# Patient Record
Sex: Male | Born: 1940 | Race: White | Hispanic: No | Marital: Married | Smoking: Never smoker
Health system: Southern US, Community
[De-identification: ages and names within clinical notes are randomized; demographics above are authoritative.]

## PROBLEM LIST (undated history)

## (undated) DIAGNOSIS — F0390 Unspecified dementia without behavioral disturbance: Secondary | ICD-10-CM

## (undated) DIAGNOSIS — I480 Paroxysmal atrial fibrillation: Secondary | ICD-10-CM

## (undated) DIAGNOSIS — G20 Parkinson's disease: Secondary | ICD-10-CM

## (undated) DIAGNOSIS — M1990 Unspecified osteoarthritis, unspecified site: Secondary | ICD-10-CM

## (undated) DIAGNOSIS — Z955 Presence of coronary angioplasty implant and graft: Secondary | ICD-10-CM

## (undated) DIAGNOSIS — I2510 Atherosclerotic heart disease of native coronary artery without angina pectoris: Secondary | ICD-10-CM

## (undated) DIAGNOSIS — E785 Hyperlipidemia, unspecified: Secondary | ICD-10-CM

## (undated) DIAGNOSIS — I5031 Acute diastolic (congestive) heart failure: Secondary | ICD-10-CM

## (undated) DIAGNOSIS — I723 Aneurysm of iliac artery: Secondary | ICD-10-CM

## (undated) DIAGNOSIS — Z951 Presence of aortocoronary bypass graft: Secondary | ICD-10-CM

## (undated) DIAGNOSIS — I10 Essential (primary) hypertension: Secondary | ICD-10-CM

## (undated) HISTORY — PX: TONSILLECTOMY: SUR1361

## (undated) HISTORY — DX: Acute diastolic (congestive) heart failure: I50.31

## (undated) HISTORY — PX: BACK SURGERY: SHX140

## (undated) HISTORY — DX: Paroxysmal atrial fibrillation: I48.0

## (undated) HISTORY — DX: Atherosclerotic heart disease of native coronary artery without angina pectoris: I25.10

## (undated) HISTORY — PX: COLONOSCOPY: SHX174

## (undated) HISTORY — PX: APPENDECTOMY: SHX54

## (undated) HISTORY — DX: Aneurysm of iliac artery: I72.3

## (undated) HISTORY — DX: Unspecified dementia, unspecified severity, without behavioral disturbance, psychotic disturbance, mood disturbance, and anxiety: F03.90

---

## 1999-11-05 ENCOUNTER — Ambulatory Visit (HOSPITAL_COMMUNITY): Admission: RE | Admit: 1999-11-05 | Discharge: 1999-11-05 | Payer: Self-pay | Admitting: Interventional Cardiology

## 1999-11-05 ENCOUNTER — Encounter: Payer: Self-pay | Admitting: Interventional Cardiology

## 2002-07-08 ENCOUNTER — Encounter (INDEPENDENT_AMBULATORY_CARE_PROVIDER_SITE_OTHER): Payer: Self-pay | Admitting: Specialist

## 2002-07-08 ENCOUNTER — Ambulatory Visit (HOSPITAL_COMMUNITY): Admission: RE | Admit: 2002-07-08 | Discharge: 2002-07-08 | Payer: Self-pay | Admitting: Gastroenterology

## 2005-11-19 ENCOUNTER — Encounter (INDEPENDENT_AMBULATORY_CARE_PROVIDER_SITE_OTHER): Payer: Self-pay | Admitting: Specialist

## 2005-11-19 ENCOUNTER — Ambulatory Visit (HOSPITAL_COMMUNITY): Admission: RE | Admit: 2005-11-19 | Discharge: 2005-11-19 | Payer: Self-pay | Admitting: Gastroenterology

## 2006-09-12 ENCOUNTER — Ambulatory Visit: Payer: Self-pay | Admitting: Family Medicine

## 2006-09-23 ENCOUNTER — Ambulatory Visit: Payer: Self-pay | Admitting: Family Medicine

## 2006-10-07 ENCOUNTER — Ambulatory Visit: Payer: Self-pay

## 2006-11-21 ENCOUNTER — Ambulatory Visit: Payer: Self-pay | Admitting: Family Medicine

## 2007-02-03 ENCOUNTER — Ambulatory Visit: Payer: Self-pay | Admitting: Family Medicine

## 2007-05-06 ENCOUNTER — Ambulatory Visit: Payer: Self-pay | Admitting: Family Medicine

## 2007-05-06 DIAGNOSIS — H1030 Unspecified acute conjunctivitis, unspecified eye: Secondary | ICD-10-CM | POA: Insufficient documentation

## 2007-11-24 DIAGNOSIS — J019 Acute sinusitis, unspecified: Secondary | ICD-10-CM | POA: Insufficient documentation

## 2007-12-01 ENCOUNTER — Ambulatory Visit: Payer: Self-pay | Admitting: Internal Medicine

## 2007-12-02 DIAGNOSIS — E785 Hyperlipidemia, unspecified: Secondary | ICD-10-CM | POA: Insufficient documentation

## 2008-02-05 ENCOUNTER — Ambulatory Visit: Payer: Self-pay | Admitting: Internal Medicine

## 2008-06-27 ENCOUNTER — Ambulatory Visit: Payer: Self-pay | Admitting: Family Medicine

## 2008-06-27 DIAGNOSIS — I5033 Acute on chronic diastolic (congestive) heart failure: Secondary | ICD-10-CM | POA: Insufficient documentation

## 2008-06-28 ENCOUNTER — Encounter: Payer: Self-pay | Admitting: Family Medicine

## 2009-04-18 ENCOUNTER — Encounter: Admission: RE | Admit: 2009-04-18 | Discharge: 2009-04-18 | Payer: Self-pay | Admitting: Family Medicine

## 2009-11-25 DIAGNOSIS — Z955 Presence of coronary angioplasty implant and graft: Secondary | ICD-10-CM

## 2009-11-25 HISTORY — PX: CARDIAC CATHETERIZATION: SHX172

## 2009-11-25 HISTORY — DX: Presence of coronary angioplasty implant and graft: Z95.5

## 2010-08-16 ENCOUNTER — Inpatient Hospital Stay (HOSPITAL_BASED_OUTPATIENT_CLINIC_OR_DEPARTMENT_OTHER): Admission: RE | Admit: 2010-08-16 | Discharge: 2010-08-16 | Payer: Self-pay | Admitting: Interventional Cardiology

## 2010-08-21 ENCOUNTER — Inpatient Hospital Stay (HOSPITAL_COMMUNITY): Admission: RE | Admit: 2010-08-21 | Discharge: 2010-08-22 | Payer: Self-pay | Admitting: Interventional Cardiology

## 2010-08-30 ENCOUNTER — Encounter (HOSPITAL_COMMUNITY)
Admission: RE | Admit: 2010-08-30 | Discharge: 2010-11-28 | Payer: Self-pay | Source: Home / Self Care | Attending: Interventional Cardiology | Admitting: Interventional Cardiology

## 2010-11-29 ENCOUNTER — Encounter (HOSPITAL_COMMUNITY)
Admission: RE | Admit: 2010-11-29 | Discharge: 2010-12-25 | Payer: Self-pay | Source: Home / Self Care | Attending: Interventional Cardiology | Admitting: Interventional Cardiology

## 2010-12-28 ENCOUNTER — Other Ambulatory Visit: Payer: Self-pay | Admitting: Dermatology

## 2011-02-07 LAB — POCT I-STAT, CHEM 8
BUN: 26 mg/dL — ABNORMAL HIGH (ref 6–23)
Calcium, Ion: 1.08 mmol/L — ABNORMAL LOW (ref 1.12–1.32)
Chloride: 105 mEq/L (ref 96–112)
Creatinine, Ser: 1.2 mg/dL (ref 0.4–1.5)
Glucose, Bld: 103 mg/dL — ABNORMAL HIGH (ref 70–99)
HCT: 47 % (ref 39.0–52.0)
Hemoglobin: 16 g/dL (ref 13.0–17.0)
Potassium: 4.1 mEq/L (ref 3.5–5.1)
Sodium: 139 mEq/L (ref 135–145)
TCO2: 27 mmol/L (ref 0–100)

## 2011-02-07 LAB — PLATELET INHIBITION P2Y12
P2Y12 % Inhibition: 40 %
Platelet Function  P2Y12: 180 [PRU] — ABNORMAL LOW (ref 194–418)
Platelet Function Baseline: 302 [PRU] (ref 194–418)

## 2011-02-07 LAB — BASIC METABOLIC PANEL
BUN: 17 mg/dL (ref 6–23)
CO2: 27 mEq/L (ref 19–32)
Calcium: 9.1 mg/dL (ref 8.4–10.5)
Chloride: 105 mEq/L (ref 96–112)
Creatinine, Ser: 1.13 mg/dL (ref 0.4–1.5)
GFR calc Af Amer: >60 mL/min (ref >60)
GFR calc non Af Amer: >60 mL/min (ref >60)
Glucose, Bld: 103 mg/dL — ABNORMAL HIGH (ref 70–99)
Potassium: 4.2 mEq/L (ref 3.5–5.1)
Sodium: 139 mEq/L (ref 135–145)

## 2011-02-07 LAB — CBC
HCT: 40.5 % (ref 39.0–52.0)
Hemoglobin: 14 g/dL (ref 13.0–17.0)
MCH: 34.1 pg — ABNORMAL HIGH (ref 26.0–34.0)
MCHC: 34.6 g/dL (ref 30.0–36.0)
MCV: 98.5 fL (ref 78.0–100.0)
Platelets: 210 10*3/uL (ref 150–400)
RBC: 4.11 MIL/uL — ABNORMAL LOW (ref 4.22–5.81)
RDW: 13.8 % (ref 11.5–15.5)
WBC: 7.5 10*3/uL (ref 4.0–10.5)

## 2011-04-12 NOTE — Op Note (Signed)
   NAME:  Calvin Ramirez, Calvin Ramirez NO.:  [id number]   MEDICAL RECORD NO.:  [id number]                   PATIENT TYPE:  AMB   LOCATION:  ENDO                                 FACILITY:  [facility]   PHYSICIAN:  Charna Elizabeth, M.D.                   DATE OF BIRTH:  1941-11-09   DATE OF PROCEDURE:  07/08/2002  DATE OF DISCHARGE:  07/08/2002                                 OPERATIVE REPORT   PROCEDURES:  Colonoscopy with snare polypectomy x2.   ENDOSCOPIST:  Charna Elizabeth, M.D.   INSTRUMENT USED:  Olympus video colonoscope.   INDICATIONS FOR PROCEDURE:  A 70 year old white male with  guaiac-positive  stools, rule out colonic polyps, masses, hemorrhoids, etc.   PREPROCEDURE PREPARATION:  Informed consent was procured from the patient.  The patient was fasted for 8 hr prior to the procedure and prepped with a  bottle of magnesium citrate and a gallon of NuLytely the night prior to the  procedure.   PREPROCEDURE PHYSICAL:  VITAL SIGNS:  Stable.  NECK:  Supple.  CHEST:  Clear to auscultation.  HEART:  S1, S2 normal.  ABDOMEN:  Soft and normal bowel sounds.   DESCRIPTION OF PROCEDURE:  The patient was placed in the left lateral  decubitus position and sedated with 100 mg Demerol and  10 mg Versed  intravenously.  Once the patient was adequately sedated and maintained on  low-flow oxygen, and continuous cardiac monitoring, the Olympus video  colonoscope was advanced into the rectum to the cecum without difficulty.  Two small sessile polyps were snared at 80 cm.  Small internal hemorrhoids  were seen on retroflexion of the rectum.  A few small scattered diverticula  were seen, especially in the right colon.   IMPRESSION:  1. Two small polyps snared at 80 cm.  2. Small internal hemorrhoids.  3. A few scattered diverticula in the right colon.   RECOMMENDATIONS:  1. Await pathology results.  2. Avoid all nonsteroidals, including aspirin for the next two weeks.  3. A  high-fiber diet has been recommended for the patient.  4. Outpatient follow-up in the next two weeks.                                              Charna Elizabeth, M.D.   JM/MEDQ  D:  07/09/2002  T:  07/12/2002  Job:  [id number]

## 2011-04-12 NOTE — Op Note (Signed)
NAME:  Calvin Ramirez, Calvin Ramirez NO.:  [id number]   MEDICAL RECORD NO.:  [id number]          PATIENT TYPE:  AMB   LOCATION:  ENDO                         FACILITY:  [facility]   PHYSICIAN:  Anselmo Rod, M.D.  DATE OF BIRTH:  1941/11/21   DATE OF PROCEDURE:  11/19/2005  DATE OF DISCHARGE:                                 OPERATIVE REPORT   PROCEDURE PERFORMED:  1.  Colonoscopy.  2.  Snare polypectomy times one.  3.  Cold biopsies times six.   ENDOSCOPIST:  Anselmo Rod, M.D.   INSTRUMENT USED:  Olympus colonoscope.   INDICATIONS FOR THE PROCEDURE:  This is a 70 year old white male with a  history of adenomatous polyp removed in the past undergoing a repeat  colonoscopy for surveillance purposes; rule out recurrent polyps.   PREPROCEDURE PREPARATION:  Informed consent was procured from the patient.  The patient fasted four hours prior to the procedure and prepped with Osmo  Prep pills the night prior to the procedure and the morning of the  procedure.  The risks and benefits of the procedure including a 10% missed  rate of cancer in polyp were discussed with the patient as well.   PREPROCEDURE PHYSICAL EXAMINATION:  VITAL SIGNS:  The patient has stable  vital signs.  NECK:  The neck is supple.  LUNGS:  The lungs are clear to auscultation.  HEART:  Normal S1 and S2.  ABDOMEN:  The advanced is soft with normal bowel sounds.   DESCRIPTION OF THE PROCEDURE:  The patient was placed in the left lateral  decubitus position, sedated with 100 mcg of fentanyl and 10 mg of Versed in  slow incremental doses.  Once the patient was adequately sedated and  maintained on low-flow oxygen and continuous cardiac monitoring the Olympus  video colonoscope was advanced from the rectum to the cecum.  There was some  residual stool in the right colon and multiple washes were done.  The  appendiceal orifice and the cecal valve were clearly visualized, and  photographed.  A small sessile  polyp was snared from the rectosigmoid colon  (hot snare times one).  The five small sessile polyps were removed by cold  biopsy (cold biopsy times six) from the cecum and the proximal right colon.  There was no evidence of diverticulosis.  The rest of the exam was  unremarkable.   Retroflexion in the rectum revealed no abnormalities.   The patient tolerated the procedure well without immediate complications.   IMPRESSION:  1.  One small sessile polyp snared from the rectosigmoid colon and five      polyps removed from the cecum and proximal right colon with cold biopsy      times six.   RECOMMENDATIONS:  1.  Await pathology results.  2.  Avoid all nonsteroidals including aspirin for the next two weeks.  3.  Repeat colonoscopy depending on pathology results.  4.  Outpatient follow up as the need arises in the future.      Anselmo Rod, M.D.  Electronically Signed     JNM/MEDQ  D:  11/19/2005  T:  11/20/2005  Job:  [id number]   cc:   Melida Quitter, M.D.  Fax: [phone]

## 2011-04-12 NOTE — Cardiovascular Report (Signed)
[place]  Patient:    Calvin Ramirez                     MRN: [id number] Proc. Date: 11/05/99 Adm. Date:  [id number] Attending:  Lyn Records. Iii CC:         Arvella Merles, M.D., [facility]                        Cardiac Catheterization  CINE NUMBER:  [id number]  INDICATIONS:  The patient is a 70 year old with positive risk factors for coronary disease including dyslipidemia, hypertension, and positive family history, who as experienced exertional chest discomfort of anginal quality for approximately one year.  This study is being done to define coronary anatomy.  PROCEDURES PERFORMED: 1. Left heart catheterization. 2. Selective coronary angiography. 3. Left ventriculography.  DESCRIPTION OF PROCEDURE:  After informed consent, a 6 French sheath was inserted into the right femoral artery using the modified Seldinger technique.  A 6 French A2 multipurpose catheter was used for hemodynamic recordings, left ventriculography, and selective coronary angiography.  The patient tolerated the procedure without complications.  After review of cine digital images in the room, the case was terminated.  RESULTS:  I. HEMODYNAMIC DATA:    a. The aortic pressure was 117/70 mmHg.    b. Left ventricular pressure 121/17 mmHg.  II: LEFT VENTRICULOGRAPHY:  The left ventricle is normal in size and demonstrates normal contractility.  Ejection fraction is 60%.  III: SELECTIVE CORONARY ANGIOGRAPHY:    a. Left main:  Normal.    b. Left anterior descending coronary artery:  Three diagonal branches       arise from a relatively large LAD.  The first diagonal trifurcates.  The       second and third diagonal are small.  Normal luminal irregularity is noted       in the mid LAD.    c. Circumflex artery:  The circumflex artery is a moderate sized vessel that  gives origin to three obtuse marginal branches and is free of any significant     obstruction.    d. Right coronary artery:  The right coronary artery is essentially normal with       minimal luminal irregularities noted after the PDA.  No significant       obstruction is seen.  The large left ventricular branch arises from the       distal right coronary.  CONCLUSIONS: 1. Essentially normal coronary arteries for the patients age. 2. Normal left ventricular function. 3. Chest discomfort of uncertain etiology.  Certainly no significant obstructive    lesions are noted to account for the patients symptomatology.  RECOMMENDATIONS:  Continue risk factor modification.  Consider GI evaluation as an alternative evaluation to explain the patients symptoms. DD:  11/05/99 TD:  11/05/99 Job: [id number]

## 2011-11-27 DIAGNOSIS — S46819A Strain of other muscles, fascia and tendons at shoulder and upper arm level, unspecified arm, initial encounter: Secondary | ICD-10-CM | POA: Diagnosis not present

## 2011-11-27 DIAGNOSIS — M25419 Effusion, unspecified shoulder: Secondary | ICD-10-CM | POA: Diagnosis not present

## 2011-12-02 DIAGNOSIS — M19019 Primary osteoarthritis, unspecified shoulder: Secondary | ICD-10-CM | POA: Diagnosis not present

## 2011-12-02 DIAGNOSIS — M75120 Complete rotator cuff tear or rupture of unspecified shoulder, not specified as traumatic: Secondary | ICD-10-CM | POA: Diagnosis not present

## 2011-12-09 DIAGNOSIS — IMO0001 Reserved for inherently not codable concepts without codable children: Secondary | ICD-10-CM | POA: Diagnosis not present

## 2011-12-09 DIAGNOSIS — E785 Hyperlipidemia, unspecified: Secondary | ICD-10-CM | POA: Diagnosis not present

## 2011-12-19 ENCOUNTER — Other Ambulatory Visit: Payer: Self-pay | Admitting: Orthopedic Surgery

## 2011-12-24 ENCOUNTER — Other Ambulatory Visit: Payer: Self-pay | Admitting: Orthopedic Surgery

## 2011-12-24 ENCOUNTER — Encounter (HOSPITAL_BASED_OUTPATIENT_CLINIC_OR_DEPARTMENT_OTHER): Payer: Self-pay | Admitting: *Deleted

## 2011-12-24 NOTE — Progress Notes (Signed)
Called for notes from dr smith-no ht problems since stents in 2011-has clearence for surg

## 2011-12-31 DIAGNOSIS — I2510 Atherosclerotic heart disease of native coronary artery without angina pectoris: Secondary | ICD-10-CM | POA: Diagnosis not present

## 2011-12-31 DIAGNOSIS — E785 Hyperlipidemia, unspecified: Secondary | ICD-10-CM | POA: Diagnosis not present

## 2011-12-31 DIAGNOSIS — I10 Essential (primary) hypertension: Secondary | ICD-10-CM | POA: Diagnosis not present

## 2012-01-14 NOTE — Progress Notes (Signed)
Surgery was r/s due to death in family-all preop instructions reviewed Bring all meds,overnight bag

## 2012-01-20 NOTE — H&P (Signed)
Bernardino Dowell is an 71 y.o. male.   Chief Complaint: Complaining of chronic and progressive right shoulder pain HPI: Patient is a 71 year old right-hand-dominant male who presented to our office in late December of 2012 for evaluation and treatment of acute onset of right shoulder pain. He states that he was taking out some laundry acute pain and inability to his right arm above his head. He brought this the attention of his primary care physician. He was referred to Dr. Katy Fitch. Yancy Hascall for further evaluation and treatment. Examination in our office revealed signs of the rotator cuff tear. Patient was sent for a MRI this was accomplished it at [facility] and revealed a retracted supraspinatus rotator cuff tear. This was discussed at length with the patient and it was decided that he would need to undergo repair.  Past Medical History  Diagnosis Date  . Coronary artery disease   . Arthritis   . Hyperlipidemia   . Stented coronary artery 2011    x3    Past Surgical History  Procedure Date  . Cardiac catheterization 2011    3 stents  . Tonsillectomy   . Appendectomy   . Colonoscopy     History reviewed. No pertinent family history. Social History:  reports that he has never smoked. He does not have any smokeless tobacco history on file. He reports that he drinks alcohol. He reports that he does not use illicit drugs.  Allergies:  Allergies  Allergen Reactions  . Penicillins     REACTION: unknown  . Levaquin (Levofloxacin Hemihydrate) Rash    No current facility-administered medications on file as of .   Medications Prior to Admission  Medication Sig Dispense Refill  . aspirin 325 MG EC tablet Take 325 mg by mouth daily.      Marland Kitchen atorvastatin (LIPITOR) 40 MG tablet Take 40 mg by mouth every evening.      . clopidogrel (PLAVIX) 75 MG tablet Take 75 mg by mouth daily.      . traMADol (ULTRAM) 50 MG tablet Take 50 mg by mouth every 6 (six) hours as needed.        No  results found for this or any previous visit (from the past 48 hour(s)).  No results found.   Pertinent items are noted in HPI.  Height 5\' 10"  (1.778 m), weight 97.07 kg (214 lb).  General appearance: alert Head: Normocephalic, without obvious abnormality Neck: supple, symmetrical, trachea midline Resp: clear to auscultation bilaterally Cardio: regular rate and rhythm, S1, S2 normal, no murmur, click, rub or gallop GI: normal findings: bowel sounds normal Extremities:.  His range of motion reveals combined elevation right shoulder 160, left shoulder 175, he has external rotation right shoulder 80, left shoulder 95, he has internal rotation to T-12 on the right vs. approximately L-2 on the left.  He cannot extend to the interscapular plane on the right without significant pain evidencing some degree of adhesive capsulitis.    On isolated strength testing he has weakness of abduction, scaption and forward flexion. He is tender on palpation on the long head of the biceps and has a positive Yergason maneuver.    Plain films of the shoulder demonstrate unfavorable AC anatomy with hypertrophic osteoarthritis and a significant anterolateral osteophyte.  He has sclerosis of the greater tuberosity.  His glenohumeral joint is well preserved.   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  Plan: Patient taken to the operating room to undergo right shoulder arthroscopy with subacromial decompression, distal clavicle resection and repair of retracted rotator cuff. The procedure risks benefits and postoperative course were discussed with the patient at length and he was in agreement with this plan.  DASNOIT,Dymond Gutt J 01/20/2012, 9:28 PM   H&P documentation: 01/21/2012  -History and Physical Reviewed  -Patient has been re-examined  -No change in the plan of  care  Wyn Forster, MD

## 2012-01-21 ENCOUNTER — Encounter (HOSPITAL_BASED_OUTPATIENT_CLINIC_OR_DEPARTMENT_OTHER): Payer: Self-pay | Admitting: *Deleted

## 2012-01-21 ENCOUNTER — Encounter (HOSPITAL_BASED_OUTPATIENT_CLINIC_OR_DEPARTMENT_OTHER): Payer: Self-pay | Admitting: Anesthesiology

## 2012-01-21 ENCOUNTER — Ambulatory Visit (HOSPITAL_BASED_OUTPATIENT_CLINIC_OR_DEPARTMENT_OTHER)
Admission: RE | Admit: 2012-01-21 | Discharge: 2012-01-22 | Disposition: A | Discharge status: Home / Self Care | Payer: Medicare Other | Source: Ambulatory Visit | Attending: Orthopedic Surgery | Admitting: Orthopedic Surgery

## 2012-01-21 ENCOUNTER — Encounter (HOSPITAL_BASED_OUTPATIENT_CLINIC_OR_DEPARTMENT_OTHER)
Admission: RE | Disposition: A | Discharge status: Home / Self Care | Payer: Self-pay | Source: Ambulatory Visit | Attending: Orthopedic Surgery

## 2012-01-21 ENCOUNTER — Ambulatory Visit (HOSPITAL_BASED_OUTPATIENT_CLINIC_OR_DEPARTMENT_OTHER): Payer: Medicare Other | Admitting: Anesthesiology

## 2012-01-21 DIAGNOSIS — M75120 Complete rotator cuff tear or rupture of unspecified shoulder, not specified as traumatic: Secondary | ICD-10-CM | POA: Diagnosis not present

## 2012-01-21 DIAGNOSIS — M24119 Other articular cartilage disorders, unspecified shoulder: Secondary | ICD-10-CM | POA: Diagnosis not present

## 2012-01-21 DIAGNOSIS — M659 Unspecified synovitis and tenosynovitis, unspecified site: Secondary | ICD-10-CM | POA: Insufficient documentation

## 2012-01-21 DIAGNOSIS — Z5333 Arthroscopic surgical procedure converted to open procedure: Secondary | ICD-10-CM | POA: Insufficient documentation

## 2012-01-21 DIAGNOSIS — E785 Hyperlipidemia, unspecified: Secondary | ICD-10-CM | POA: Insufficient documentation

## 2012-01-21 DIAGNOSIS — S43429A Sprain of unspecified rotator cuff capsule, initial encounter: Secondary | ICD-10-CM | POA: Diagnosis not present

## 2012-01-21 DIAGNOSIS — M25519 Pain in unspecified shoulder: Secondary | ICD-10-CM | POA: Diagnosis not present

## 2012-01-21 DIAGNOSIS — G8918 Other acute postprocedural pain: Secondary | ICD-10-CM | POA: Diagnosis not present

## 2012-01-21 DIAGNOSIS — M719 Bursopathy, unspecified: Secondary | ICD-10-CM | POA: Insufficient documentation

## 2012-01-21 DIAGNOSIS — I2510 Atherosclerotic heart disease of native coronary artery without angina pectoris: Secondary | ICD-10-CM | POA: Insufficient documentation

## 2012-01-21 DIAGNOSIS — M25819 Other specified joint disorders, unspecified shoulder: Secondary | ICD-10-CM | POA: Diagnosis not present

## 2012-01-21 DIAGNOSIS — M19019 Primary osteoarthritis, unspecified shoulder: Secondary | ICD-10-CM | POA: Diagnosis not present

## 2012-01-21 DIAGNOSIS — M67919 Unspecified disorder of synovium and tendon, unspecified shoulder: Secondary | ICD-10-CM | POA: Diagnosis not present

## 2012-01-21 DIAGNOSIS — Z9861 Coronary angioplasty status: Secondary | ICD-10-CM | POA: Diagnosis not present

## 2012-01-21 HISTORY — DX: Hyperlipidemia, unspecified: E78.5

## 2012-01-21 HISTORY — DX: Unspecified osteoarthritis, unspecified site: M19.90

## 2012-01-21 HISTORY — DX: Atherosclerotic heart disease of native coronary artery without angina pectoris: I25.10

## 2012-01-21 HISTORY — DX: Presence of coronary angioplasty implant and graft: Z95.5

## 2012-01-21 LAB — POCT HEMOGLOBIN-HEMACUE: Hemoglobin: 14.8 g/dL (ref 13.0–17.0)

## 2012-01-21 SURGERY — SHOULDER ARTHROSCOPY WITH OPEN ROTATOR CUFF REPAIR AND DISTAL CLAVICLE ACROMINECTOMY
Anesthesia: General | Site: Shoulder | Laterality: Right | Wound class: Clean

## 2012-01-21 MED ORDER — BUPIVACAINE HCL (PF) 0.25 % IJ SOLN
INTRAMUSCULAR | Status: DC | PRN
  Administered 2012-01-21: 15 mL

## 2012-01-21 MED ORDER — HYDROCODONE-ACETAMINOPHEN 5-325 MG PO TABS: 1.0000 | ORAL_TABLET | ORAL | Status: DC | PRN

## 2012-01-21 MED ORDER — SUCCINYLCHOLINE CHLORIDE 20 MG/ML IJ SOLN
INTRAMUSCULAR | Status: DC | PRN
  Administered 2012-01-21: 100 mg via INTRAVENOUS

## 2012-01-21 MED ORDER — ATORVASTATIN CALCIUM 40 MG PO TABS
40.0000 mg | ORAL_TABLET | Freq: Every evening | ORAL | Status: DC
Start: 2012-01-21 — End: 2012-01-22

## 2012-01-21 MED ORDER — VANCOMYCIN HCL IN DEXTROSE 1-5 GM/200ML-% IV SOLN
1000.0000 mg | Freq: Two times a day (BID) | INTRAVENOUS | Status: AC
  Administered 2012-01-22: 1000 mg via INTRAVENOUS

## 2012-01-21 MED ORDER — METOCLOPRAMIDE HCL 5 MG/ML IJ SOLN
INTRAMUSCULAR | Status: DC | PRN
  Administered 2012-01-21: 10 mg via INTRAVENOUS

## 2012-01-21 MED ORDER — LIDOCAINE HCL (CARDIAC) 20 MG/ML IV SOLN
INTRAVENOUS | Status: DC | PRN
  Administered 2012-01-21: 60 mg via INTRAVENOUS

## 2012-01-21 MED ORDER — LABETALOL HCL 5 MG/ML IV SOLN
INTRAVENOUS | Status: DC | PRN
  Administered 2012-01-21: 5 mg via INTRAVENOUS

## 2012-01-21 MED ORDER — CHLORHEXIDINE GLUCONATE 4 % EX LIQD: 60.0000 mL | Freq: Once | CUTANEOUS | Status: DC

## 2012-01-21 MED ORDER — IBUPROFEN 600 MG PO TABS: 600.0000 mg | ORAL_TABLET | Freq: Four times a day (QID) | ORAL | Status: AC | PRN

## 2012-01-21 MED ORDER — MIDAZOLAM HCL 2 MG/2ML IJ SOLN
0.5000 mg | INTRAMUSCULAR | Status: DC | PRN
  Administered 2012-01-21: 2 mg via INTRAVENOUS

## 2012-01-21 MED ORDER — METOCLOPRAMIDE HCL 5 MG/ML IJ SOLN: 5.0000 mg | Freq: Three times a day (TID) | INTRAMUSCULAR | Status: DC | PRN

## 2012-01-21 MED ORDER — OXYCODONE-ACETAMINOPHEN 5-325 MG PO TABS
1.0000 | ORAL_TABLET | ORAL | Status: DC | PRN
  Administered 2012-01-22: 2 via ORAL

## 2012-01-21 MED ORDER — LACTATED RINGERS IV SOLN
INTRAVENOUS | Status: DC
  Administered 2012-01-21 (×2): via INTRAVENOUS

## 2012-01-21 MED ORDER — ONDANSETRON HCL 4 MG PO TABS: 4.0000 mg | ORAL_TABLET | Freq: Four times a day (QID) | ORAL | Status: DC | PRN

## 2012-01-21 MED ORDER — METOCLOPRAMIDE HCL 5 MG/ML IJ SOLN: 10.0000 mg | Freq: Once | INTRAMUSCULAR | Status: AC | PRN

## 2012-01-21 MED ORDER — FENTANYL CITRATE 0.05 MG/ML IJ SOLN
50.0000 ug | INTRAMUSCULAR | Status: DC | PRN
  Administered 2012-01-21: 50 ug via INTRAVENOUS

## 2012-01-21 MED ORDER — HYDROMORPHONE HCL PF 1 MG/ML IJ SOLN: 0.2500 mg | INTRAMUSCULAR | Status: DC | PRN

## 2012-01-21 MED ORDER — DEXAMETHASONE SODIUM PHOSPHATE 4 MG/ML IJ SOLN
INTRAMUSCULAR | Status: DC | PRN
  Administered 2012-01-21: 10 mg via INTRAVENOUS

## 2012-01-21 MED ORDER — METOCLOPRAMIDE HCL 5 MG PO TABS: 5.0000 mg | ORAL_TABLET | Freq: Three times a day (TID) | ORAL | Status: DC | PRN

## 2012-01-21 MED ORDER — HYDROMORPHONE HCL 2 MG PO TABS: ORAL_TABLET | ORAL | Status: AC

## 2012-01-21 MED ORDER — DOXYCYCLINE HYCLATE 100 MG PO TABS: 100.0000 mg | ORAL_TABLET | Freq: Two times a day (BID) | ORAL | Status: AC

## 2012-01-21 MED ORDER — FENTANYL CITRATE 0.05 MG/ML IJ SOLN
INTRAMUSCULAR | Status: DC | PRN
  Administered 2012-01-21 (×2): 50 ug via INTRAVENOUS

## 2012-01-21 MED ORDER — ONDANSETRON HCL 4 MG/2ML IJ SOLN: 4.0000 mg | Freq: Four times a day (QID) | INTRAMUSCULAR | Status: DC | PRN

## 2012-01-21 MED ORDER — VANCOMYCIN HCL 1000 MG IV SOLR
1000.0000 mg | INTRAVENOUS | Status: DC | PRN
  Administered 2012-01-21: 1000 mg via INTRAVENOUS

## 2012-01-21 MED ORDER — GLYCOPYRROLATE 0.2 MG/ML IJ SOLN
INTRAMUSCULAR | Status: DC | PRN
  Administered 2012-01-21: 0.2 mg via INTRAVENOUS

## 2012-01-21 MED ORDER — HYDROMORPHONE HCL PF 1 MG/ML IJ SOLN
0.5000 mg | INTRAMUSCULAR | Status: DC | PRN
  Administered 2012-01-22: 1 mg via INTRAVENOUS

## 2012-01-21 MED ORDER — SODIUM CHLORIDE 0.9 % IR SOLN
Status: DC | PRN
  Administered 2012-01-21: 13500 mL

## 2012-01-21 MED ORDER — SODIUM CHLORIDE 0.9 % IV SOLN
INTRAVENOUS | Status: DC
  Administered 2012-01-21: 19:00:00 via INTRAVENOUS

## 2012-01-21 MED ORDER — PROPOFOL 10 MG/ML IV EMUL
INTRAVENOUS | Status: DC | PRN
  Administered 2012-01-21: 200 mg via INTRAVENOUS
  Administered 2012-01-21 (×3): 50 mg via INTRAVENOUS

## 2012-01-21 MED ORDER — ONDANSETRON HCL 4 MG/2ML IJ SOLN
INTRAMUSCULAR | Status: DC | PRN
  Administered 2012-01-21: 4 mg via INTRAVENOUS

## 2012-01-21 SURGICAL SUPPLY — 85 items
ANCH SUT SWLK 19.1 CLS EYLT TL (Anchor) ×1 IMPLANT
ANCH SUT SWLK 19.1 CLS EYLT VT (Anchor) ×1 IMPLANT
ANCH SUT SWLK 19.1X5.5 CLS EL (Anchor) ×4 IMPLANT
ANCHOR BIO SWLOCK 4.75 W/TIG (Anchor) ×2 IMPLANT
ANCHOR PEEK SWIVEL LOCK 5.5 (Anchor) ×8 IMPLANT
ANCHOR SUT BIO SW 4.75 W/FIB (Anchor) ×2 IMPLANT
BANDAGE ADHESIVE 1X3 (GAUZE/BANDAGES/DRESSINGS) IMPLANT
BLADE AVERAGE 25X9 (BLADE) IMPLANT
BLADE CUTTER MENIS 5.5 (BLADE) ×2 IMPLANT
BLADE SURG 15 STRL LF DISP TIS (BLADE) ×2 IMPLANT
BLADE SURG 15 STRL SS (BLADE) ×2
BUR EGG 3PK/BX (BURR) ×2 IMPLANT
BUR OVAL 6.0 (BURR) ×2 IMPLANT
CANISTER OMNI JUG 16 LITER (MISCELLANEOUS) ×2 IMPLANT
CANISTER SUCTION 2500CC (MISCELLANEOUS) ×2 IMPLANT
CANNULA 5.75X7 CRYSTAL CLEAR (CANNULA) IMPLANT
CANNULA SHOULDER 7CM (CANNULA) IMPLANT
CANNULA TWIST IN 8.25X7CM (CANNULA) IMPLANT
CLEANER CAUTERY TIP 5X5 PAD (MISCELLANEOUS) IMPLANT
CLOTH BEACON ORANGE TIMEOUT ST (SAFETY) ×2 IMPLANT
CUTTER MENISCUS  4.2MM (BLADE) ×1
CUTTER MENISCUS 4.2MM (BLADE) ×1 IMPLANT
DECANTER SPIKE VIAL GLASS SM (MISCELLANEOUS) IMPLANT
DRAPE INCISE IOBAN 66X45 STRL (DRAPES) ×2 IMPLANT
DRAPE STERI 35X30 U-POUCH (DRAPES) ×2 IMPLANT
DRAPE SURG 17X23 STRL (DRAPES) ×2 IMPLANT
DRAPE U-SHAPE 47X51 STRL (DRAPES) ×2 IMPLANT
DRAPE U-SHAPE 76X120 STRL (DRAPES) ×4 IMPLANT
DRSG PAD ABDOMINAL 8X10 ST (GAUZE/BANDAGES/DRESSINGS) ×2 IMPLANT
DURAPREP 26ML APPLICATOR (WOUND CARE) ×2 IMPLANT
ELECT REM PT RETURN 9FT ADLT (ELECTROSURGICAL) ×2
ELECTRODE REM PT RTRN 9FT ADLT (ELECTROSURGICAL) ×1 IMPLANT
GLOVE BIO SURGEON STRL SZ 6.5 (GLOVE) ×2 IMPLANT
GLOVE BIO SURGEON STRL SZ7 (GLOVE) ×2 IMPLANT
GLOVE BIOGEL M STRL SZ7.5 (GLOVE) ×2 IMPLANT
GLOVE BIOGEL PI IND STRL 7.0 (GLOVE) ×1 IMPLANT
GLOVE BIOGEL PI IND STRL 8 (GLOVE) ×2 IMPLANT
GLOVE BIOGEL PI INDICATOR 7.0 (GLOVE) ×1
GLOVE BIOGEL PI INDICATOR 8 (GLOVE) ×2
GLOVE ORTHO TXT STRL SZ7.5 (GLOVE) ×2 IMPLANT
GOWN BRE IMP PREV XXLGXLNG (GOWN DISPOSABLE) ×6 IMPLANT
GOWN PREVENTION PLUS XLARGE (GOWN DISPOSABLE) ×2 IMPLANT
NDL SUT 6 .5 CRC .975X.05 MAYO (NEEDLE) ×1 IMPLANT
NEEDLE MAYO TAPER (NEEDLE) ×2
NEEDLE MINI RC 24MM (NEEDLE) IMPLANT
NEEDLE SCORPION (NEEDLE) ×2 IMPLANT
PACK ARTHROSCOPY DSU (CUSTOM PROCEDURE TRAY) ×2 IMPLANT
PACK BASIN DAY SURGERY FS (CUSTOM PROCEDURE TRAY) ×2 IMPLANT
PAD CLEANER CAUTERY TIP 5X5 (MISCELLANEOUS)
PASSER SUT SWANSON 36MM LOOP (INSTRUMENTS) IMPLANT
PENCIL BUTTON HOLSTER BLD 10FT (ELECTRODE) ×2 IMPLANT
SLEEVE SCD COMPRESS KNEE MED (MISCELLANEOUS) ×2 IMPLANT
SLING ARM FOAM STRAP LRG (SOFTGOODS) ×2 IMPLANT
SLING ARM FOAM STRAP MED (SOFTGOODS) IMPLANT
SPONGE GAUZE 4X4 12PLY (GAUZE/BANDAGES/DRESSINGS) ×2 IMPLANT
SPONGE LAP 4X18 X RAY DECT (DISPOSABLE) IMPLANT
STRIP CLOSURE SKIN 1/2X4 (GAUZE/BANDAGES/DRESSINGS) ×2 IMPLANT
SUCTION FRAZIER TIP 10 FR DISP (SUCTIONS) IMPLANT
SUT ETHIBOND 2 OS 4 DA (SUTURE) IMPLANT
SUT ETHILON 4 0 PS 2 18 (SUTURE) IMPLANT
SUT FIBERWIRE #2 38 T-5 BLUE (SUTURE)
SUT FIBERWIRE 3-0 18 TAPR NDL (SUTURE)
SUT PROLENE 1 CT (SUTURE) IMPLANT
SUT PROLENE 3 0 PS 2 (SUTURE) ×2 IMPLANT
SUT TIGER TAPE 7 IN WHITE (SUTURE) ×2 IMPLANT
SUT VIC AB 0 CT1 27 (SUTURE)
SUT VIC AB 0 CT1 27XBRD ANBCTR (SUTURE) IMPLANT
SUT VIC AB 0 SH 27 (SUTURE) ×2 IMPLANT
SUT VIC AB 2-0 SH 27 (SUTURE) ×2
SUT VIC AB 2-0 SH 27XBRD (SUTURE) ×1 IMPLANT
SUT VIC AB 3-0 SH 27 (SUTURE)
SUT VIC AB 3-0 SH 27X BRD (SUTURE) IMPLANT
SUT VIC AB 3-0 X1 27 (SUTURE) IMPLANT
SUTURE FIBERWR #2 38 T-5 BLUE (SUTURE) IMPLANT
SUTURE FIBERWR 3-0 18 TAPR NDL (SUTURE) IMPLANT
SYR 3ML 23GX1 SAFETY (SYRINGE) IMPLANT
SYR BULB 3OZ (MISCELLANEOUS) IMPLANT
TAPE FIBER 2MM 7IN #2 BLUE (SUTURE) IMPLANT
TAPE PAPER 3X10 WHT MICROPORE (GAUZE/BANDAGES/DRESSINGS) ×2 IMPLANT
TOWEL OR 17X24 6PK STRL BLUE (TOWEL DISPOSABLE) ×2 IMPLANT
TUBE CONNECTING 20X1/4 (TUBING) ×4 IMPLANT
TUBING ARTHROSCOPY IRRIG 16FT (MISCELLANEOUS) ×2 IMPLANT
WAND STAR VAC 90 (SURGICAL WAND) ×2 IMPLANT
WATER STERILE IRR 1000ML POUR (IV SOLUTION) ×2 IMPLANT
YANKAUER SUCT BULB TIP NO VENT (SUCTIONS) IMPLANT

## 2012-01-21 NOTE — Progress Notes (Signed)
Assisted Dr. Frederick with right, ultrasound guided, interscalene  block. Side rails up, monitors on throughout procedure. See vital signs in flow sheet. Tolerated Procedure well. 

## 2012-01-21 NOTE — Transfer of Care (Signed)
Immediate Anesthesia Transfer of Care Note  Patient: Calvin Ramirez  Procedure(s) Performed: Procedure(s) (LRB): SHOULDER ARTHROSCOPY WITH OPEN ROTATOR CUFF REPAIR AND DISTAL CLAVICLE ACROMINECTOMY (Right)  Patient Location: PACU  Anesthesia Type: GA combined with regional for post-op pain  Level of Consciousness: sedated  Airway & Oxygen Therapy: Patient Spontanous Breathing and Patient connected to face mask oxygen  Post-op Assessment: Report given to PACU RN and Post -op Vital signs reviewed and stable  Post vital signs: Reviewed and stable  Complications: No apparent anesthesia complications

## 2012-01-21 NOTE — Brief Op Note (Signed)
01/21/2012  5:45 PM  PATIENT:  Calvin Ramirez  71 y.o. male  PRE-OPERATIVE DIAGNOSIS:  right shoulder impingement, rotator cuff tear, severe ac djd  POST-OPERATIVE DIAGNOSIS:  right shoulder impingement, rotator cuff tear, glenoid and humeral chondromalacia, severe ac djd  PROCEDURE: SHOULDER ARTHROSCOPY WITH OPEN ROTATOR CUFF REPAIR AND DISTAL CLAVICLE EXCISION, ACROMIOPLASTY AND DEBRIDEMENT OF CHONDROMALACIA AND LABRUM RIGHT SHOULDER  SURGEON:  Surgeon(s) and Role:    * Wyn Forster., MD - Primary  PHYSICIAN ASSISTANT:   ASSISTANTS: Mallory Shirk.A-C    ANESTHESIA:   general  EBL:  Total I/O In: 1800 [I.V.:1800] Out: -   BLOOD ADMINISTERED:none  DRAINS: none   LOCAL MEDICATIONS USED:  PLEXUS BLOCK  SPECIMEN:  No Specimen  DISPOSITION OF SPECIMEN:  N/A  COUNTS:  YES  TOURNIQUET:  * No tourniquets in log *  DICTATION: .Other Dictation: Dictation Number [id number]  PLAN OF CARE: Admit for overnight observation  PATIENT DISPOSITION:  PACU - hemodynamically stable.

## 2012-01-21 NOTE — Anesthesia Procedure Notes (Addendum)
Anesthesia Regional Block:  Interscalene brachial plexus block  Pre-Anesthetic Checklist: ,, timeout performed, Correct Patient, Correct Site, Correct Laterality, Correct Procedure, Correct Position, site marked, Risks and benefits discussed,  Surgical consent,  Pre-op evaluation,  At surgeon's request and post-op pain management  Laterality: Right  Prep: chloraprep       Needles:   Needle Type: Other   (Arrow Echogenic)   Needle Length: 9cm  Needle Gauge: 21    Additional Needles:  Procedures: ultrasound guided Interscalene brachial plexus block Narrative:  Start time: 01/21/2012 1:02 PM End time: 01/21/2012 1:08 PM Injection made incrementally with aspirations every 5 mL.  Performed by: Personally  Anesthesiologist: Aldona Lento, MD  Additional Notes: Ultrasound guidance used to: id relevant anatomy, confirm needle position, local anesthetic spread, avoidance of vascular puncture. Picture saved. No complications. Block performed personally by Janetta Hora. Gelene Mink, MD    Interscalene brachial plexus block Procedure Name: Intubation Date/Time: 01/21/2012 2:56 PM Performed by: Signa Kell Pre-anesthesia Checklist: Patient identified, Emergency Drugs available, Suction available and Patient being monitored Patient Re-evaluated:Patient Re-evaluated prior to inductionOxygen Delivery Method: Circle System Utilized Preoxygenation: Pre-oxygenation with 100% oxygen Intubation Type: IV induction Ventilation: Mask ventilation without difficulty Laryngoscope Size: Mac and 3 Grade View: Grade III Tube type: Oral Number of attempts: 1 Airway Equipment and Method: stylet and oral airway Placement Confirmation: positive ETCO2 and breath sounds checked- equal and bilateral Secured at: 22 cm Tube secured with: Tape Dental Injury: Teeth and Oropharynx as per pre-operative assessment

## 2012-01-21 NOTE — Progress Notes (Signed)
Pt very active on admission to PACU , pulled off O2 face mask and trying to get sling off. Two RN's , tech and later wife called to bedside to help hold pt down. Unable to assess  Motor,sensation and pulse due to activity of pt.

## 2012-01-21 NOTE — Op Note (Signed)
OP NOTE DICTATED:  01/21/12 [id number]

## 2012-01-21 NOTE — Anesthesia Preprocedure Evaluation (Signed)
Anesthesia Evaluation  Patient identified by MRN, date of birth, ID band Patient awake    Reviewed: Allergy & Precautions, H&P , NPO status , Patient's Chart, lab work & pertinent test results, reviewed documented beta blocker date and time   Airway Mallampati: II TM Distance: >3 FB Neck ROM: full    Dental   Pulmonary neg pulmonary ROS,          Cardiovascular + CAD and + Cardiac Stents     Neuro/Psych Negative Neurological ROS  Negative Psych ROS   GI/Hepatic negative GI ROS, Neg liver ROS,   Endo/Other  Negative Endocrine ROS  Renal/GU negative Renal ROS  Genitourinary negative   Musculoskeletal   Abdominal   Peds  Hematology negative hematology ROS (+)   Anesthesia Other Findings See surgeon's H&P   Reproductive/Obstetrics negative OB ROS                           Anesthesia Physical Anesthesia Plan  ASA: III  Anesthesia Plan: General   Post-op Pain Management: MAC Combined w/ Regional for Post-op pain   Induction: Intravenous  Airway Management Planned: Oral ETT  Additional Equipment:   Intra-op Plan:   Post-operative Plan: Extubation in OR  Informed Consent: I have reviewed the patients History and Physical, chart, labs and discussed the procedure including the risks, benefits and alternatives for the proposed anesthesia with the patient or authorized representative who has indicated his/her understanding and acceptance.     Plan Discussed with: CRNA and Surgeon  Anesthesia Plan Comments:         Anesthesia Quick Evaluation

## 2012-01-21 NOTE — Anesthesia Postprocedure Evaluation (Signed)
Anesthesia Post Note  Patient: Daxton Nydam  Procedure(s) Performed: Procedure(s) (LRB): SHOULDER ARTHROSCOPY WITH OPEN ROTATOR CUFF REPAIR AND DISTAL CLAVICLE ACROMINECTOMY (Right)  Anesthesia type: general  Patient location: PACU  Post pain: Pain level controlled  Post assessment: Patient's Cardiovascular Status Stable  Last Vitals:  Filed Vitals:   01/21/12 1745  BP: 137/59  Pulse:   Temp:   Resp: 20    Post vital signs: Reviewed and stable  Level of consciousness: sedated  Complications: No apparent anesthesia complications  Had an episode of agitation. Resolved spontaneously. Arta Bruce MD

## 2012-01-22 MED ORDER — ZOLPIDEM TARTRATE 5 MG PO TABS
5.0000 mg | ORAL_TABLET | Freq: Every evening | ORAL | Status: DC | PRN
  Administered 2012-01-21: 5 mg via ORAL

## 2012-01-22 NOTE — Op Note (Signed)
NAME:  Calvin, Ramirez NO.:  MEDICAL RECORD NO.:  [id number]  LOCATION:                                 FACILITY:  PHYSICIAN:  Katy Fitch. Natale Barba, M.D.      DATE OF BIRTH:  DATE OF PROCEDURE:  01/21/2012 DATE OF DISCHARGE:                              OPERATIVE REPORT   PREOPERATIVE DIAGNOSES: 1. 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 2. Significant synovitis. 3. Degenerative labral changes.  POSTOPERATIVE DIAGNOSES:  Confirming retracted chronic supraspinatus infraspinatus rotator cuff tears, synovitis, and labral tear with significant acromioclavicular degenerative arthritis.  OPERATIONS: 1. Diagnostic arthroscopy, right glenohumeral joint. 2. Arthroscopic debridement of labrum and rotator cuff tear, and     limited debridement of glenoid grade 2 chondromalacia. 3. Arthroscopic subacromial decompression with bursectomy,     coracoacromial ligament release and acromioplasty. 4. Arthroscopic distal clavicle resection. 5. Open reconstruction of chronically retracted rotator cuff tear     involving entire supraspinatus rotator interval and infraspinatus.  OPERATING SURGEON:  Katy Fitch. Eddrick Dilone, MD  ASSISTANT:  Marveen Reeks Dasnoit, PA  ANESTHESIA:  General by endotracheal technique supplemented by a right plexus block.  SUPERVISING ANESTHESIOLOGIST:  Janetta Hora. Gelene Mink, M.D.  INDICATIONS:  Calvin Ramirez is a 71 year old gentleman, referred through the courtesy of Dr. Lurena Joiner for evaluation and management of a painful and weak right shoulder.  Clinical examination revealed signs of a significant rotator cuff tear, and plain films documented advanced AC degenerative arthritis.  Mr. Blakeman was sent for an MRI which definitively identified a large retracted rotator cuff tear, unfavorable AC anatomy and signs of synovitis.  We recommended proceeding with diagnostic  arthroscopy and appropriate intervention, anticipating subacromial decompression, distal clavicle resection, repair of the rotator cuff, and labral debridement.  After informed consent, he was brought to the operating room at this time.  Preoperatively, he was interviewed by Dr. Gelene Mink in the holding area.  Dr. Gelene Mink recommended a plexus block.  This was placed without complication with ultrasound control in the holding area, leading to excellent anesthesia of the right forequarter and arm.  DESCRIPTION OF PROCEDURE:  Calvin Ramirez was brought to room 2 of the [facility] and placed in supine position on the operating table.  Following the induction of general endotracheal anesthesia under Dr. Thornton Dales direct supervision, he was carefully positioned in a beach- chair position with aid of a torso and head holder designed for shoulder arthroscopy.  The shoulder was examined under anesthesia.  No sign of adhesive capsulitis noted.  The shoulder was stable in all planes of testing.  The right arm and forequarter prepped with DuraPrep and draped with impervious arthroscopy drapes.  Following routine surgical time-out and confirmation of 1 g of vancomycin being provided as an IV prophylactic antibiotic, Mr. Pavlock's shoulder was instrumented from an anterior approach with a switching stick followed by placement of the arthroscope through a standard posterior viewing portal.  Diagnostic arthroscopy confirmed grade 2 chondromalacia of the glenoid, grade 2 chondromalacia of portions of the humeral head, both of which were documented with digital camera.  The labrum was degenerative.  An anterior  port was created under direct vision and the labrum debrided. The rotator cuff had a very large retracted chronic necrotic tear.  This was documented with a digital camera followed by debridement.  After completion of the intra-articular debridement, the scope was  removed followed by creation of an anterior middle third deltoid splitting incision.  We proceeded to debride the greater tuberosity of remnants of the rotator cuff followed by use of a power burr to lower its profile 3 mm to bleeding cancellous surface.  The rotator cuff was gathered with a reverse mattress suture placed with a Scorpion using a fiber tape followed by placement of 2 medial PEEK corkscrew anchors, followed by medial footprint mattress sutures and a double diamondback reconstruction with the fiber tape used to lateralize the supraspinatus. An anatomic inset of the rotator cuff was created.  Very low profile was achieved.  The suture tails were cut with the arthroscopic suture cutter followed by irrigation of subacromial space and repair of the deltoid split with mattress suture of 0 Vicryl.  The scope was then placed in subacromial space followed by use of the bipolar cautery to remove bursa and release the capsule at [facility] joint.  The medial acromial osteophyte was documented in digital camera and subsequent level to a type 1 morphology.  There was a hook type 3 anterior acromion that was leveled to a type 1 morphology after release of coracoacromial ligament with use of a suction burr to level the acromion.  The distal centimeter of clavicle was removed arthroscopically.  Hemostasis was achieved with bipolar cautery.  Following further bursectomy, hemostasis was achieved followed by withdrawal of the arthroscopic equipment and repair of the portals with 3-0 Prolene.  There were no apparent complications.  This was a large chronic retracted tear that required reconstruction rather than simple repair.  A very satisfactory low-profile repair was achieved.  The wounds were dressed with sterile gauze followed by paper tape.  Ms. Dershem was placed in a sling, awakened from general anesthesia, and transferred to recovery room with stable signs.  He will be discharged in 24  hours with prescriptions for Dilaudid 2 mg 1- 2 tablets p.o. q.4-6 hours p.r.n. pain, 30 tablets without refill, also doxycycline 100 mg p.o. b.i.d. x4 days, and ibuprofen 600 mg 1 p.o. q.6 hours p.r.n. pain.  He will resume his aspirin.  He has been taken off his Plavix by his attending cardiologist, Dr. Verdis Prime.     Katy Fitch Nealy Karapetian, M.D.     RVS/MEDQ  D:  01/21/2012  T:  01/22/2012  Job:  [id number]

## 2012-01-22 NOTE — Discharge Instructions (Signed)
Hand Center Instructions Hand Surgery  Wound Care: Keep your hand elevated above the level of your heart.  Do not allow it to dangle  by your side.  Keep the dressing dry and do not remove it unless your doctor advises you to do so.  He will usually change it at the time of your post-op visit.  Moving your fingers is advised to stimulate circulation but will depend on the site of your surgery.  If you have a splint applied, your doctor will advise you regarding movement.  Activity: Do not drive or operate machinery today.  Rest today and then you may return to your normal activity and work as indicated by your physician.  Diet:  Drink liquids today or eat a light diet.  You may resume a regular diet tomorrow.    General expectations: Pain for two to three days. Fingers may become slightly swollen.  Call your doctor if any of the following occur: Severe pain not relieved by pain medication. Elevated temperature. Dressing soaked with blood. Inability to move fingers. White or bluish color to fingers.  Peripheral Nerve Blocks Your caregiver has placed a nerve block in one of your arms  to reduce pain and discomfort. The block lessens the amount of pain medicine you will need. Your caregiver will inject you in the arm  that was operated on. The injection is usually given away from the surgical site. The injection is a local anesthetic or a combination of local anesthetics. This injection provides numbing pain relief for up to 18 to 24 hours. There are few possible complications from this procedure. However, you should notify your caregiver if you have any problems. Be aware that you may lose feeling at and around the surgical area. If numbness happens, take proper measures to avoid injury.  Do not try to lift items if you have had a nerve block in your arm. Be careful when placing hot or cold items on a numb extremity. SEEK IMMEDIATE MEDICAL CARE IF:  You have redness, swelling, pain, or  discharge at the injection site.   You develop dizziness or lightheadedness.   You have blurred vision.   There is a ringing or buzzing in your ears.   You have a metal taste in your mouth.   You develop numbness or tingling around your mouth.   You develop drowsiness.   You develop confusion.

## 2012-01-24 DIAGNOSIS — M19019 Primary osteoarthritis, unspecified shoulder: Secondary | ICD-10-CM | POA: Diagnosis not present

## 2012-01-24 DIAGNOSIS — M75120 Complete rotator cuff tear or rupture of unspecified shoulder, not specified as traumatic: Secondary | ICD-10-CM | POA: Diagnosis not present

## 2012-02-10 DIAGNOSIS — M24119 Other articular cartilage disorders, unspecified shoulder: Secondary | ICD-10-CM | POA: Diagnosis not present

## 2012-02-18 DIAGNOSIS — M719 Bursopathy, unspecified: Secondary | ICD-10-CM | POA: Diagnosis not present

## 2012-02-18 DIAGNOSIS — M24119 Other articular cartilage disorders, unspecified shoulder: Secondary | ICD-10-CM | POA: Diagnosis not present

## 2012-02-18 DIAGNOSIS — M67919 Unspecified disorder of synovium and tendon, unspecified shoulder: Secondary | ICD-10-CM | POA: Diagnosis not present

## 2012-02-25 DIAGNOSIS — M24119 Other articular cartilage disorders, unspecified shoulder: Secondary | ICD-10-CM | POA: Diagnosis not present

## 2012-02-25 DIAGNOSIS — M719 Bursopathy, unspecified: Secondary | ICD-10-CM | POA: Diagnosis not present

## 2012-02-25 DIAGNOSIS — M67919 Unspecified disorder of synovium and tendon, unspecified shoulder: Secondary | ICD-10-CM | POA: Diagnosis not present

## 2012-03-02 DIAGNOSIS — M67919 Unspecified disorder of synovium and tendon, unspecified shoulder: Secondary | ICD-10-CM | POA: Diagnosis not present

## 2012-03-02 DIAGNOSIS — M24119 Other articular cartilage disorders, unspecified shoulder: Secondary | ICD-10-CM | POA: Diagnosis not present

## 2012-03-02 DIAGNOSIS — M719 Bursopathy, unspecified: Secondary | ICD-10-CM | POA: Diagnosis not present

## 2012-03-10 DIAGNOSIS — M67919 Unspecified disorder of synovium and tendon, unspecified shoulder: Secondary | ICD-10-CM | POA: Diagnosis not present

## 2012-03-10 DIAGNOSIS — M75120 Complete rotator cuff tear or rupture of unspecified shoulder, not specified as traumatic: Secondary | ICD-10-CM | POA: Diagnosis not present

## 2012-03-10 DIAGNOSIS — M24119 Other articular cartilage disorders, unspecified shoulder: Secondary | ICD-10-CM | POA: Diagnosis not present

## 2012-03-18 DIAGNOSIS — M67919 Unspecified disorder of synovium and tendon, unspecified shoulder: Secondary | ICD-10-CM | POA: Diagnosis not present

## 2012-03-18 DIAGNOSIS — M24119 Other articular cartilage disorders, unspecified shoulder: Secondary | ICD-10-CM | POA: Diagnosis not present

## 2012-03-23 DIAGNOSIS — M67919 Unspecified disorder of synovium and tendon, unspecified shoulder: Secondary | ICD-10-CM | POA: Diagnosis not present

## 2012-03-23 DIAGNOSIS — M719 Bursopathy, unspecified: Secondary | ICD-10-CM | POA: Diagnosis not present

## 2012-03-23 DIAGNOSIS — M24119 Other articular cartilage disorders, unspecified shoulder: Secondary | ICD-10-CM | POA: Diagnosis not present

## 2012-03-30 DIAGNOSIS — M67919 Unspecified disorder of synovium and tendon, unspecified shoulder: Secondary | ICD-10-CM | POA: Diagnosis not present

## 2012-03-30 DIAGNOSIS — M719 Bursopathy, unspecified: Secondary | ICD-10-CM | POA: Diagnosis not present

## 2012-03-30 DIAGNOSIS — M24119 Other articular cartilage disorders, unspecified shoulder: Secondary | ICD-10-CM | POA: Diagnosis not present

## 2012-04-06 DIAGNOSIS — M67919 Unspecified disorder of synovium and tendon, unspecified shoulder: Secondary | ICD-10-CM | POA: Diagnosis not present

## 2012-04-06 DIAGNOSIS — M719 Bursopathy, unspecified: Secondary | ICD-10-CM | POA: Diagnosis not present

## 2012-04-06 DIAGNOSIS — M24119 Other articular cartilage disorders, unspecified shoulder: Secondary | ICD-10-CM | POA: Diagnosis not present

## 2012-04-13 DIAGNOSIS — M67919 Unspecified disorder of synovium and tendon, unspecified shoulder: Secondary | ICD-10-CM | POA: Diagnosis not present

## 2012-04-13 DIAGNOSIS — M24119 Other articular cartilage disorders, unspecified shoulder: Secondary | ICD-10-CM | POA: Diagnosis not present

## 2012-04-13 DIAGNOSIS — M719 Bursopathy, unspecified: Secondary | ICD-10-CM | POA: Diagnosis not present

## 2012-05-05 DIAGNOSIS — M24119 Other articular cartilage disorders, unspecified shoulder: Secondary | ICD-10-CM | POA: Diagnosis not present

## 2012-05-05 DIAGNOSIS — M67919 Unspecified disorder of synovium and tendon, unspecified shoulder: Secondary | ICD-10-CM | POA: Diagnosis not present

## 2012-05-11 DIAGNOSIS — M719 Bursopathy, unspecified: Secondary | ICD-10-CM | POA: Diagnosis not present

## 2012-05-11 DIAGNOSIS — M67919 Unspecified disorder of synovium and tendon, unspecified shoulder: Secondary | ICD-10-CM | POA: Diagnosis not present

## 2012-05-11 DIAGNOSIS — M24119 Other articular cartilage disorders, unspecified shoulder: Secondary | ICD-10-CM | POA: Diagnosis not present

## 2012-05-19 DIAGNOSIS — M67919 Unspecified disorder of synovium and tendon, unspecified shoulder: Secondary | ICD-10-CM | POA: Diagnosis not present

## 2012-05-19 DIAGNOSIS — M719 Bursopathy, unspecified: Secondary | ICD-10-CM | POA: Diagnosis not present

## 2012-05-19 DIAGNOSIS — M24119 Other articular cartilage disorders, unspecified shoulder: Secondary | ICD-10-CM | POA: Diagnosis not present

## 2012-06-02 DIAGNOSIS — M24119 Other articular cartilage disorders, unspecified shoulder: Secondary | ICD-10-CM | POA: Diagnosis not present

## 2012-06-02 DIAGNOSIS — M67919 Unspecified disorder of synovium and tendon, unspecified shoulder: Secondary | ICD-10-CM | POA: Diagnosis not present

## 2012-06-02 DIAGNOSIS — M75120 Complete rotator cuff tear or rupture of unspecified shoulder, not specified as traumatic: Secondary | ICD-10-CM | POA: Diagnosis not present

## 2012-06-08 DIAGNOSIS — M67919 Unspecified disorder of synovium and tendon, unspecified shoulder: Secondary | ICD-10-CM | POA: Diagnosis not present

## 2012-06-08 DIAGNOSIS — M24119 Other articular cartilage disorders, unspecified shoulder: Secondary | ICD-10-CM | POA: Diagnosis not present

## 2012-06-08 DIAGNOSIS — M719 Bursopathy, unspecified: Secondary | ICD-10-CM | POA: Diagnosis not present

## 2012-07-08 DIAGNOSIS — H2510 Age-related nuclear cataract, unspecified eye: Secondary | ICD-10-CM | POA: Diagnosis not present

## 2012-10-29 DIAGNOSIS — Z23 Encounter for immunization: Secondary | ICD-10-CM | POA: Diagnosis not present

## 2012-12-09 DIAGNOSIS — E785 Hyperlipidemia, unspecified: Secondary | ICD-10-CM | POA: Diagnosis not present

## 2013-01-05 DIAGNOSIS — M999 Biomechanical lesion, unspecified: Secondary | ICD-10-CM | POA: Diagnosis not present

## 2013-01-05 DIAGNOSIS — M5030 Other cervical disc degeneration, unspecified cervical region: Secondary | ICD-10-CM | POA: Diagnosis not present

## 2013-01-05 DIAGNOSIS — M9981 Other biomechanical lesions of cervical region: Secondary | ICD-10-CM | POA: Diagnosis not present

## 2013-01-05 DIAGNOSIS — IMO0002 Reserved for concepts with insufficient information to code with codable children: Secondary | ICD-10-CM | POA: Diagnosis not present

## 2013-01-06 DIAGNOSIS — M5137 Other intervertebral disc degeneration, lumbosacral region: Secondary | ICD-10-CM | POA: Diagnosis not present

## 2013-01-06 DIAGNOSIS — M999 Biomechanical lesion, unspecified: Secondary | ICD-10-CM | POA: Diagnosis not present

## 2013-01-06 DIAGNOSIS — M5030 Other cervical disc degeneration, unspecified cervical region: Secondary | ICD-10-CM | POA: Diagnosis not present

## 2013-01-06 DIAGNOSIS — M9981 Other biomechanical lesions of cervical region: Secondary | ICD-10-CM | POA: Diagnosis not present

## 2013-01-11 DIAGNOSIS — M5137 Other intervertebral disc degeneration, lumbosacral region: Secondary | ICD-10-CM | POA: Diagnosis not present

## 2013-01-11 DIAGNOSIS — M999 Biomechanical lesion, unspecified: Secondary | ICD-10-CM | POA: Diagnosis not present

## 2013-01-11 DIAGNOSIS — IMO0002 Reserved for concepts with insufficient information to code with codable children: Secondary | ICD-10-CM | POA: Diagnosis not present

## 2013-01-12 DIAGNOSIS — IMO0002 Reserved for concepts with insufficient information to code with codable children: Secondary | ICD-10-CM | POA: Diagnosis not present

## 2013-01-12 DIAGNOSIS — M5137 Other intervertebral disc degeneration, lumbosacral region: Secondary | ICD-10-CM | POA: Diagnosis not present

## 2013-01-12 DIAGNOSIS — M999 Biomechanical lesion, unspecified: Secondary | ICD-10-CM | POA: Diagnosis not present

## 2013-01-14 DIAGNOSIS — M5030 Other cervical disc degeneration, unspecified cervical region: Secondary | ICD-10-CM | POA: Diagnosis not present

## 2013-01-14 DIAGNOSIS — M9981 Other biomechanical lesions of cervical region: Secondary | ICD-10-CM | POA: Diagnosis not present

## 2013-01-14 DIAGNOSIS — M999 Biomechanical lesion, unspecified: Secondary | ICD-10-CM | POA: Diagnosis not present

## 2013-01-14 DIAGNOSIS — M5137 Other intervertebral disc degeneration, lumbosacral region: Secondary | ICD-10-CM | POA: Diagnosis not present

## 2013-01-18 DIAGNOSIS — E785 Hyperlipidemia, unspecified: Secondary | ICD-10-CM | POA: Diagnosis not present

## 2013-01-18 DIAGNOSIS — I2510 Atherosclerotic heart disease of native coronary artery without angina pectoris: Secondary | ICD-10-CM | POA: Diagnosis not present

## 2013-01-18 DIAGNOSIS — I10 Essential (primary) hypertension: Secondary | ICD-10-CM | POA: Diagnosis not present

## 2013-02-17 DIAGNOSIS — H2510 Age-related nuclear cataract, unspecified eye: Secondary | ICD-10-CM | POA: Diagnosis not present

## 2013-06-01 DIAGNOSIS — M5137 Other intervertebral disc degeneration, lumbosacral region: Secondary | ICD-10-CM | POA: Diagnosis not present

## 2013-06-01 DIAGNOSIS — IMO0002 Reserved for concepts with insufficient information to code with codable children: Secondary | ICD-10-CM | POA: Diagnosis not present

## 2013-06-01 DIAGNOSIS — M5030 Other cervical disc degeneration, unspecified cervical region: Secondary | ICD-10-CM | POA: Diagnosis not present

## 2013-06-01 DIAGNOSIS — M9981 Other biomechanical lesions of cervical region: Secondary | ICD-10-CM | POA: Diagnosis not present

## 2013-06-01 DIAGNOSIS — M999 Biomechanical lesion, unspecified: Secondary | ICD-10-CM | POA: Diagnosis not present

## 2013-06-02 DIAGNOSIS — M999 Biomechanical lesion, unspecified: Secondary | ICD-10-CM | POA: Diagnosis not present

## 2013-06-02 DIAGNOSIS — M5030 Other cervical disc degeneration, unspecified cervical region: Secondary | ICD-10-CM | POA: Diagnosis not present

## 2013-06-02 DIAGNOSIS — M5137 Other intervertebral disc degeneration, lumbosacral region: Secondary | ICD-10-CM | POA: Diagnosis not present

## 2013-06-02 DIAGNOSIS — M9981 Other biomechanical lesions of cervical region: Secondary | ICD-10-CM | POA: Diagnosis not present

## 2013-06-02 DIAGNOSIS — IMO0002 Reserved for concepts with insufficient information to code with codable children: Secondary | ICD-10-CM | POA: Diagnosis not present

## 2013-06-03 DIAGNOSIS — M5030 Other cervical disc degeneration, unspecified cervical region: Secondary | ICD-10-CM | POA: Diagnosis not present

## 2013-06-03 DIAGNOSIS — IMO0002 Reserved for concepts with insufficient information to code with codable children: Secondary | ICD-10-CM | POA: Diagnosis not present

## 2013-06-03 DIAGNOSIS — M999 Biomechanical lesion, unspecified: Secondary | ICD-10-CM | POA: Diagnosis not present

## 2013-06-03 DIAGNOSIS — M9981 Other biomechanical lesions of cervical region: Secondary | ICD-10-CM | POA: Diagnosis not present

## 2013-06-03 DIAGNOSIS — M5137 Other intervertebral disc degeneration, lumbosacral region: Secondary | ICD-10-CM | POA: Diagnosis not present

## 2013-06-07 DIAGNOSIS — M5030 Other cervical disc degeneration, unspecified cervical region: Secondary | ICD-10-CM | POA: Diagnosis not present

## 2013-06-07 DIAGNOSIS — M999 Biomechanical lesion, unspecified: Secondary | ICD-10-CM | POA: Diagnosis not present

## 2013-06-07 DIAGNOSIS — M9981 Other biomechanical lesions of cervical region: Secondary | ICD-10-CM | POA: Diagnosis not present

## 2013-06-07 DIAGNOSIS — IMO0002 Reserved for concepts with insufficient information to code with codable children: Secondary | ICD-10-CM | POA: Diagnosis not present

## 2013-06-07 DIAGNOSIS — M5137 Other intervertebral disc degeneration, lumbosacral region: Secondary | ICD-10-CM | POA: Diagnosis not present

## 2013-06-08 DIAGNOSIS — M5030 Other cervical disc degeneration, unspecified cervical region: Secondary | ICD-10-CM | POA: Diagnosis not present

## 2013-06-08 DIAGNOSIS — M999 Biomechanical lesion, unspecified: Secondary | ICD-10-CM | POA: Diagnosis not present

## 2013-06-08 DIAGNOSIS — M9981 Other biomechanical lesions of cervical region: Secondary | ICD-10-CM | POA: Diagnosis not present

## 2013-06-08 DIAGNOSIS — M5137 Other intervertebral disc degeneration, lumbosacral region: Secondary | ICD-10-CM | POA: Diagnosis not present

## 2013-06-08 DIAGNOSIS — IMO0002 Reserved for concepts with insufficient information to code with codable children: Secondary | ICD-10-CM | POA: Diagnosis not present

## 2013-06-10 DIAGNOSIS — M9981 Other biomechanical lesions of cervical region: Secondary | ICD-10-CM | POA: Diagnosis not present

## 2013-06-10 DIAGNOSIS — M5137 Other intervertebral disc degeneration, lumbosacral region: Secondary | ICD-10-CM | POA: Diagnosis not present

## 2013-06-10 DIAGNOSIS — IMO0002 Reserved for concepts with insufficient information to code with codable children: Secondary | ICD-10-CM | POA: Diagnosis not present

## 2013-06-10 DIAGNOSIS — M999 Biomechanical lesion, unspecified: Secondary | ICD-10-CM | POA: Diagnosis not present

## 2013-06-10 DIAGNOSIS — M5030 Other cervical disc degeneration, unspecified cervical region: Secondary | ICD-10-CM | POA: Diagnosis not present

## 2013-06-14 DIAGNOSIS — M5137 Other intervertebral disc degeneration, lumbosacral region: Secondary | ICD-10-CM | POA: Diagnosis not present

## 2013-06-14 DIAGNOSIS — M999 Biomechanical lesion, unspecified: Secondary | ICD-10-CM | POA: Diagnosis not present

## 2013-06-14 DIAGNOSIS — M5030 Other cervical disc degeneration, unspecified cervical region: Secondary | ICD-10-CM | POA: Diagnosis not present

## 2013-06-14 DIAGNOSIS — M9981 Other biomechanical lesions of cervical region: Secondary | ICD-10-CM | POA: Diagnosis not present

## 2013-06-14 DIAGNOSIS — IMO0002 Reserved for concepts with insufficient information to code with codable children: Secondary | ICD-10-CM | POA: Diagnosis not present

## 2013-06-16 DIAGNOSIS — IMO0002 Reserved for concepts with insufficient information to code with codable children: Secondary | ICD-10-CM | POA: Diagnosis not present

## 2013-06-16 DIAGNOSIS — M999 Biomechanical lesion, unspecified: Secondary | ICD-10-CM | POA: Diagnosis not present

## 2013-06-16 DIAGNOSIS — M9981 Other biomechanical lesions of cervical region: Secondary | ICD-10-CM | POA: Diagnosis not present

## 2013-06-16 DIAGNOSIS — M5137 Other intervertebral disc degeneration, lumbosacral region: Secondary | ICD-10-CM | POA: Diagnosis not present

## 2013-06-16 DIAGNOSIS — M5030 Other cervical disc degeneration, unspecified cervical region: Secondary | ICD-10-CM | POA: Diagnosis not present

## 2013-06-18 DIAGNOSIS — M999 Biomechanical lesion, unspecified: Secondary | ICD-10-CM | POA: Diagnosis not present

## 2013-06-18 DIAGNOSIS — M9981 Other biomechanical lesions of cervical region: Secondary | ICD-10-CM | POA: Diagnosis not present

## 2013-06-18 DIAGNOSIS — M5030 Other cervical disc degeneration, unspecified cervical region: Secondary | ICD-10-CM | POA: Diagnosis not present

## 2013-06-18 DIAGNOSIS — IMO0002 Reserved for concepts with insufficient information to code with codable children: Secondary | ICD-10-CM | POA: Diagnosis not present

## 2013-06-18 DIAGNOSIS — M5137 Other intervertebral disc degeneration, lumbosacral region: Secondary | ICD-10-CM | POA: Diagnosis not present

## 2013-06-21 DIAGNOSIS — M9981 Other biomechanical lesions of cervical region: Secondary | ICD-10-CM | POA: Diagnosis not present

## 2013-06-21 DIAGNOSIS — M5137 Other intervertebral disc degeneration, lumbosacral region: Secondary | ICD-10-CM | POA: Diagnosis not present

## 2013-06-21 DIAGNOSIS — IMO0002 Reserved for concepts with insufficient information to code with codable children: Secondary | ICD-10-CM | POA: Diagnosis not present

## 2013-06-21 DIAGNOSIS — M5030 Other cervical disc degeneration, unspecified cervical region: Secondary | ICD-10-CM | POA: Diagnosis not present

## 2013-06-21 DIAGNOSIS — M999 Biomechanical lesion, unspecified: Secondary | ICD-10-CM | POA: Diagnosis not present

## 2013-06-23 DIAGNOSIS — M999 Biomechanical lesion, unspecified: Secondary | ICD-10-CM | POA: Diagnosis not present

## 2013-06-23 DIAGNOSIS — M5137 Other intervertebral disc degeneration, lumbosacral region: Secondary | ICD-10-CM | POA: Diagnosis not present

## 2013-06-23 DIAGNOSIS — M9981 Other biomechanical lesions of cervical region: Secondary | ICD-10-CM | POA: Diagnosis not present

## 2013-06-23 DIAGNOSIS — IMO0002 Reserved for concepts with insufficient information to code with codable children: Secondary | ICD-10-CM | POA: Diagnosis not present

## 2013-06-23 DIAGNOSIS — M5030 Other cervical disc degeneration, unspecified cervical region: Secondary | ICD-10-CM | POA: Diagnosis not present

## 2013-06-25 DIAGNOSIS — M5030 Other cervical disc degeneration, unspecified cervical region: Secondary | ICD-10-CM | POA: Diagnosis not present

## 2013-06-25 DIAGNOSIS — M999 Biomechanical lesion, unspecified: Secondary | ICD-10-CM | POA: Diagnosis not present

## 2013-06-25 DIAGNOSIS — IMO0002 Reserved for concepts with insufficient information to code with codable children: Secondary | ICD-10-CM | POA: Diagnosis not present

## 2013-06-25 DIAGNOSIS — M5137 Other intervertebral disc degeneration, lumbosacral region: Secondary | ICD-10-CM | POA: Diagnosis not present

## 2013-06-25 DIAGNOSIS — M9981 Other biomechanical lesions of cervical region: Secondary | ICD-10-CM | POA: Diagnosis not present

## 2013-06-28 DIAGNOSIS — M999 Biomechanical lesion, unspecified: Secondary | ICD-10-CM | POA: Diagnosis not present

## 2013-06-28 DIAGNOSIS — M9981 Other biomechanical lesions of cervical region: Secondary | ICD-10-CM | POA: Diagnosis not present

## 2013-06-28 DIAGNOSIS — M5030 Other cervical disc degeneration, unspecified cervical region: Secondary | ICD-10-CM | POA: Diagnosis not present

## 2013-06-28 DIAGNOSIS — IMO0002 Reserved for concepts with insufficient information to code with codable children: Secondary | ICD-10-CM | POA: Diagnosis not present

## 2013-06-28 DIAGNOSIS — M5137 Other intervertebral disc degeneration, lumbosacral region: Secondary | ICD-10-CM | POA: Diagnosis not present

## 2013-06-30 DIAGNOSIS — M9981 Other biomechanical lesions of cervical region: Secondary | ICD-10-CM | POA: Diagnosis not present

## 2013-06-30 DIAGNOSIS — IMO0002 Reserved for concepts with insufficient information to code with codable children: Secondary | ICD-10-CM | POA: Diagnosis not present

## 2013-06-30 DIAGNOSIS — M999 Biomechanical lesion, unspecified: Secondary | ICD-10-CM | POA: Diagnosis not present

## 2013-06-30 DIAGNOSIS — M5030 Other cervical disc degeneration, unspecified cervical region: Secondary | ICD-10-CM | POA: Diagnosis not present

## 2013-06-30 DIAGNOSIS — M5137 Other intervertebral disc degeneration, lumbosacral region: Secondary | ICD-10-CM | POA: Diagnosis not present

## 2013-07-02 DIAGNOSIS — M5137 Other intervertebral disc degeneration, lumbosacral region: Secondary | ICD-10-CM | POA: Diagnosis not present

## 2013-07-02 DIAGNOSIS — M9981 Other biomechanical lesions of cervical region: Secondary | ICD-10-CM | POA: Diagnosis not present

## 2013-07-02 DIAGNOSIS — M5030 Other cervical disc degeneration, unspecified cervical region: Secondary | ICD-10-CM | POA: Diagnosis not present

## 2013-07-02 DIAGNOSIS — M999 Biomechanical lesion, unspecified: Secondary | ICD-10-CM | POA: Diagnosis not present

## 2013-07-02 DIAGNOSIS — IMO0002 Reserved for concepts with insufficient information to code with codable children: Secondary | ICD-10-CM | POA: Diagnosis not present

## 2013-07-05 DIAGNOSIS — M9981 Other biomechanical lesions of cervical region: Secondary | ICD-10-CM | POA: Diagnosis not present

## 2013-07-05 DIAGNOSIS — IMO0002 Reserved for concepts with insufficient information to code with codable children: Secondary | ICD-10-CM | POA: Diagnosis not present

## 2013-07-05 DIAGNOSIS — M5030 Other cervical disc degeneration, unspecified cervical region: Secondary | ICD-10-CM | POA: Diagnosis not present

## 2013-07-05 DIAGNOSIS — M5137 Other intervertebral disc degeneration, lumbosacral region: Secondary | ICD-10-CM | POA: Diagnosis not present

## 2013-07-05 DIAGNOSIS — M999 Biomechanical lesion, unspecified: Secondary | ICD-10-CM | POA: Diagnosis not present

## 2013-07-07 DIAGNOSIS — IMO0002 Reserved for concepts with insufficient information to code with codable children: Secondary | ICD-10-CM | POA: Diagnosis not present

## 2013-07-07 DIAGNOSIS — M5030 Other cervical disc degeneration, unspecified cervical region: Secondary | ICD-10-CM | POA: Diagnosis not present

## 2013-07-07 DIAGNOSIS — M5137 Other intervertebral disc degeneration, lumbosacral region: Secondary | ICD-10-CM | POA: Diagnosis not present

## 2013-07-07 DIAGNOSIS — M9981 Other biomechanical lesions of cervical region: Secondary | ICD-10-CM | POA: Diagnosis not present

## 2013-07-07 DIAGNOSIS — M999 Biomechanical lesion, unspecified: Secondary | ICD-10-CM | POA: Diagnosis not present

## 2013-07-12 DIAGNOSIS — M999 Biomechanical lesion, unspecified: Secondary | ICD-10-CM | POA: Diagnosis not present

## 2013-07-12 DIAGNOSIS — M5030 Other cervical disc degeneration, unspecified cervical region: Secondary | ICD-10-CM | POA: Diagnosis not present

## 2013-07-12 DIAGNOSIS — IMO0002 Reserved for concepts with insufficient information to code with codable children: Secondary | ICD-10-CM | POA: Diagnosis not present

## 2013-07-12 DIAGNOSIS — M9981 Other biomechanical lesions of cervical region: Secondary | ICD-10-CM | POA: Diagnosis not present

## 2013-07-12 DIAGNOSIS — M5137 Other intervertebral disc degeneration, lumbosacral region: Secondary | ICD-10-CM | POA: Diagnosis not present

## 2013-07-14 DIAGNOSIS — M9981 Other biomechanical lesions of cervical region: Secondary | ICD-10-CM | POA: Diagnosis not present

## 2013-07-14 DIAGNOSIS — M5030 Other cervical disc degeneration, unspecified cervical region: Secondary | ICD-10-CM | POA: Diagnosis not present

## 2013-07-14 DIAGNOSIS — M5137 Other intervertebral disc degeneration, lumbosacral region: Secondary | ICD-10-CM | POA: Diagnosis not present

## 2013-07-14 DIAGNOSIS — IMO0002 Reserved for concepts with insufficient information to code with codable children: Secondary | ICD-10-CM | POA: Diagnosis not present

## 2013-07-14 DIAGNOSIS — M999 Biomechanical lesion, unspecified: Secondary | ICD-10-CM | POA: Diagnosis not present

## 2013-07-19 DIAGNOSIS — M9981 Other biomechanical lesions of cervical region: Secondary | ICD-10-CM | POA: Diagnosis not present

## 2013-07-19 DIAGNOSIS — M5137 Other intervertebral disc degeneration, lumbosacral region: Secondary | ICD-10-CM | POA: Diagnosis not present

## 2013-07-19 DIAGNOSIS — M5030 Other cervical disc degeneration, unspecified cervical region: Secondary | ICD-10-CM | POA: Diagnosis not present

## 2013-07-19 DIAGNOSIS — M999 Biomechanical lesion, unspecified: Secondary | ICD-10-CM | POA: Diagnosis not present

## 2013-07-19 DIAGNOSIS — IMO0002 Reserved for concepts with insufficient information to code with codable children: Secondary | ICD-10-CM | POA: Diagnosis not present

## 2013-09-06 DIAGNOSIS — Z23 Encounter for immunization: Secondary | ICD-10-CM | POA: Diagnosis not present

## 2013-11-25 ENCOUNTER — Other Ambulatory Visit: Payer: Self-pay | Admitting: *Deleted

## 2013-11-25 DIAGNOSIS — E785 Hyperlipidemia, unspecified: Secondary | ICD-10-CM

## 2013-11-29 ENCOUNTER — Telehealth: Payer: Self-pay | Admitting: Interventional Cardiology

## 2013-12-02 DIAGNOSIS — H2510 Age-related nuclear cataract, unspecified eye: Secondary | ICD-10-CM | POA: Diagnosis not present

## 2013-12-13 ENCOUNTER — Other Ambulatory Visit (INDEPENDENT_AMBULATORY_CARE_PROVIDER_SITE_OTHER): Payer: Medicare Other

## 2013-12-13 DIAGNOSIS — E785 Hyperlipidemia, unspecified: Secondary | ICD-10-CM | POA: Diagnosis not present

## 2013-12-13 LAB — LIPID PANEL
Cholesterol: 140 mg/dL (ref 0–200)
HDL: 32.3 mg/dL — ABNORMAL LOW (ref >39.00)
Total CHOL/HDL Ratio: 4
Triglycerides: 257 mg/dL — ABNORMAL HIGH (ref 0.0–149.0)
VLDL: 51.4 mg/dL — ABNORMAL HIGH (ref 0.0–40.0)

## 2013-12-13 LAB — HEPATIC FUNCTION PANEL
ALT: 22 U/L (ref 0–53)
AST: 25 U/L (ref 0–37)
Albumin: 4.5 g/dL (ref 3.5–5.2)
Alkaline Phosphatase: 66 U/L (ref 39–117)
Bilirubin, Direct: 0 mg/dL (ref 0.0–0.3)
Total Bilirubin: 0.8 mg/dL (ref 0.3–1.2)
Total Protein: 7.6 g/dL (ref 6.0–8.3)

## 2013-12-13 LAB — LDL CHOLESTEROL, DIRECT: Direct LDL: 82.6 mg/dL

## 2013-12-24 ENCOUNTER — Telehealth: Payer: Self-pay

## 2013-12-24 DIAGNOSIS — E785 Hyperlipidemia, unspecified: Secondary | ICD-10-CM

## 2013-12-24 NOTE — Telephone Encounter (Signed)
Message copied by Lamar Laundry on Fri Dec 24, 2013  8:19 AM ------      Message from: Daneen Schick      Created: Tue Dec 14, 2013  7:26 AM       At goal. Repeat these labs in 1 year ------

## 2013-12-24 NOTE — Telephone Encounter (Signed)
Message copied by Lamar Laundry on Fri Dec 24, 2013  8:18 AM ------      Message from: Daneen Schick      Created: Tue Dec 14, 2013  7:26 AM       At goal. Repeat these labs in 1 year ------

## 2013-12-24 NOTE — Telephone Encounter (Signed)
pt given lab results.At goal. Repeat these labs in 1 year.pt verbalized understanding.

## 2013-12-29 ENCOUNTER — Telehealth: Payer: Self-pay | Admitting: Interventional Cardiology

## 2013-12-29 NOTE — Telephone Encounter (Signed)
°  Refill    Lipitor refill sent to [facility] on [address].  Call when done.

## 2013-12-30 MED ORDER — ATORVASTATIN CALCIUM 40 MG PO TABS: 40.0000 mg | ORAL_TABLET | Freq: Every evening | ORAL | Status: DC

## 2013-12-30 NOTE — Telephone Encounter (Signed)
refill done. pt aware. 

## 2014-01-18 ENCOUNTER — Ambulatory Visit: Payer: PRIVATE HEALTH INSURANCE | Admitting: Interventional Cardiology

## 2014-02-02 DIAGNOSIS — J329 Chronic sinusitis, unspecified: Secondary | ICD-10-CM | POA: Diagnosis not present

## 2014-02-10 DIAGNOSIS — H2510 Age-related nuclear cataract, unspecified eye: Secondary | ICD-10-CM | POA: Diagnosis not present

## 2014-02-16 ENCOUNTER — Ambulatory Visit: Payer: Medicare Other | Admitting: Interventional Cardiology

## 2014-03-10 ENCOUNTER — Ambulatory Visit: Payer: Medicare Other | Admitting: Interventional Cardiology

## 2014-04-21 ENCOUNTER — Ambulatory Visit (INDEPENDENT_AMBULATORY_CARE_PROVIDER_SITE_OTHER): Payer: Medicare Other | Admitting: Interventional Cardiology

## 2014-04-21 ENCOUNTER — Encounter: Payer: Self-pay | Admitting: Interventional Cardiology

## 2014-04-21 VITALS — BP 126/74 | HR 63 | Ht 70.0 in | Wt 218.0 lb

## 2014-04-21 DIAGNOSIS — I10 Essential (primary) hypertension: Secondary | ICD-10-CM

## 2014-04-21 DIAGNOSIS — I2510 Atherosclerotic heart disease of native coronary artery without angina pectoris: Secondary | ICD-10-CM | POA: Diagnosis not present

## 2014-04-21 DIAGNOSIS — E785 Hyperlipidemia, unspecified: Secondary | ICD-10-CM | POA: Diagnosis not present

## 2014-04-21 HISTORY — DX: Essential (primary) hypertension: I10

## 2014-04-21 MED ORDER — ATORVASTATIN CALCIUM 40 MG PO TABS: 40.0000 mg | ORAL_TABLET | Freq: Every evening | ORAL | Status: DC

## 2014-04-21 NOTE — Progress Notes (Signed)
Patient ID: Calvin Ramirez, male   DOB: [birth year], 73 y.o.   MRN: [id number]    [address] [place],   [address] Phone: [phone] Fax:  [phone]  Date:  04/21/2014   ID:  Calvin Ramirez, DOB 1941-02-24, MRN [id number]  PCP:  Shirline Frees, MD   ASSESSMENT:  1. CAD with multivessel DES intervention on LAD and distal right coronary, 2011, asymptomatic 2. Hypertension, controlled  3. Hyperlipidemia   PLAN:  1. No changes in therapy. Continue atorvastatin 40 mg per day with followup statin panel in 12 months 2. Clinical followup in one year 3. Maintain an active lifestyle   SUBJECTIVE: Calvin Ramirez is a 73 y.o. male who is asymptomatic. No angina or dyspnea. No nitroglycerin use. He has not had limitations at work. He denies transient neurological complaints. No need for nitroglycerin. He denies medication side effects.   Wt Readings from Last 3 Encounters:  04/21/14 218 lb (98.884 kg)  12/24/11 214 lb (97.07 kg)  12/24/11 214 lb (97.07 kg)     Past Medical History  Diagnosis Date  . Coronary artery disease   . Arthritis   . Hyperlipidemia   . Stented coronary artery 2011    x3    Current Outpatient Prescriptions  Medication Sig Dispense Refill  . aspirin 81 MG tablet Take 81 mg by mouth daily.      Marland Kitchen atorvastatin (LIPITOR) 40 MG tablet Take 1 tablet (40 mg total) by mouth every evening.  30 tablet  2   No current facility-administered medications for this visit.    Allergies:    Allergies  Allergen Reactions  . Penicillins     REACTION: unknown  . Levaquin [Levofloxacin Hemihydrate] Rash    Social History:  The patient  reports that he has never smoked. He does not have any smokeless tobacco history on file. He reports that he drinks alcohol. He reports that he does not use illicit drugs.   ROS:  Please see the history of present illness.   No claudication or neurological complaints   All other systems reviewed and  negative.   OBJECTIVE: VS:  BP 126/74  Pulse 63  Ht 5\' 10"  (1.778 m)  Wt 218 lb (98.884 kg)  BMI 31.28 kg/m2 Well nourished, well developed, in no acute distress, in his stated age  18: normal Neck: JVD flat. Carotid bruit absent  Cardiac:  normal S1, S2; RRR; no murmur Lungs:  clear to auscultation bilaterally, no wheezing, rhonchi or rales Abd: soft, nontender, no hepatomegaly Ext: Edema none. Pulses 2+ bilateral  Skin: warm and dry Neuro:  CNs 2-12 intact, no focal abnormalities noted  EKG:  Normal sinus rhythm with normal tracing.       Signed, Illene Labrador III, MD 04/21/2014 11:41 AM

## 2014-04-21 NOTE — Patient Instructions (Signed)
Your physician recommends that you continue on your current medications as directed. Please refer to the Current Medication list given to you today.  Your physician recommends that you return for a FASTING lipid profile and alt in 1 year  Your physician wants you to follow-up in: 1 year You will receive a reminder letter in the mail two months in advance. If you don't receive a letter, please call our office to schedule the follow-up appointment.

## 2014-05-20 DIAGNOSIS — H2510 Age-related nuclear cataract, unspecified eye: Secondary | ICD-10-CM | POA: Diagnosis not present

## 2014-05-20 DIAGNOSIS — H25019 Cortical age-related cataract, unspecified eye: Secondary | ICD-10-CM | POA: Diagnosis not present

## 2014-06-15 DIAGNOSIS — H25019 Cortical age-related cataract, unspecified eye: Secondary | ICD-10-CM | POA: Diagnosis not present

## 2014-06-15 DIAGNOSIS — H269 Unspecified cataract: Secondary | ICD-10-CM | POA: Diagnosis not present

## 2014-06-15 DIAGNOSIS — H2510 Age-related nuclear cataract, unspecified eye: Secondary | ICD-10-CM | POA: Diagnosis not present

## 2014-06-23 DIAGNOSIS — H2510 Age-related nuclear cataract, unspecified eye: Secondary | ICD-10-CM | POA: Diagnosis not present

## 2014-06-23 DIAGNOSIS — H25019 Cortical age-related cataract, unspecified eye: Secondary | ICD-10-CM | POA: Diagnosis not present

## 2014-06-29 DIAGNOSIS — H2510 Age-related nuclear cataract, unspecified eye: Secondary | ICD-10-CM | POA: Diagnosis not present

## 2014-06-29 DIAGNOSIS — H25019 Cortical age-related cataract, unspecified eye: Secondary | ICD-10-CM | POA: Diagnosis not present

## 2014-06-29 DIAGNOSIS — H269 Unspecified cataract: Secondary | ICD-10-CM | POA: Diagnosis not present

## 2014-07-08 NOTE — Telephone Encounter (Signed)
error 

## 2014-08-25 DIAGNOSIS — J209 Acute bronchitis, unspecified: Secondary | ICD-10-CM | POA: Diagnosis not present

## 2014-08-25 DIAGNOSIS — J0100 Acute maxillary sinusitis, unspecified: Secondary | ICD-10-CM | POA: Diagnosis not present

## 2014-09-11 DIAGNOSIS — Z23 Encounter for immunization: Secondary | ICD-10-CM | POA: Diagnosis not present

## 2014-11-15 DIAGNOSIS — R399 Unspecified symptoms and signs involving the genitourinary system: Secondary | ICD-10-CM | POA: Diagnosis not present

## 2014-11-15 DIAGNOSIS — N400 Enlarged prostate without lower urinary tract symptoms: Secondary | ICD-10-CM | POA: Diagnosis not present

## 2014-11-15 DIAGNOSIS — R350 Frequency of micturition: Secondary | ICD-10-CM | POA: Diagnosis not present

## 2014-11-24 ENCOUNTER — Other Ambulatory Visit: Payer: Self-pay | Admitting: Dermatology

## 2014-11-24 DIAGNOSIS — L570 Actinic keratosis: Secondary | ICD-10-CM | POA: Diagnosis not present

## 2014-11-24 DIAGNOSIS — L821 Other seborrheic keratosis: Secondary | ICD-10-CM | POA: Diagnosis not present

## 2014-11-24 DIAGNOSIS — D1801 Hemangioma of skin and subcutaneous tissue: Secondary | ICD-10-CM | POA: Diagnosis not present

## 2014-11-24 DIAGNOSIS — D2239 Melanocytic nevi of other parts of face: Secondary | ICD-10-CM | POA: Diagnosis not present

## 2014-11-24 DIAGNOSIS — D2262 Melanocytic nevi of left upper limb, including shoulder: Secondary | ICD-10-CM | POA: Diagnosis not present

## 2014-11-24 DIAGNOSIS — D2261 Melanocytic nevi of right upper limb, including shoulder: Secondary | ICD-10-CM | POA: Diagnosis not present

## 2014-11-24 DIAGNOSIS — L814 Other melanin hyperpigmentation: Secondary | ICD-10-CM | POA: Diagnosis not present

## 2014-11-24 DIAGNOSIS — D225 Melanocytic nevi of trunk: Secondary | ICD-10-CM | POA: Diagnosis not present

## 2014-11-24 DIAGNOSIS — D485 Neoplasm of uncertain behavior of skin: Secondary | ICD-10-CM | POA: Diagnosis not present

## 2014-12-05 DIAGNOSIS — M5136 Other intervertebral disc degeneration, lumbar region: Secondary | ICD-10-CM | POA: Diagnosis not present

## 2014-12-05 DIAGNOSIS — M9905 Segmental and somatic dysfunction of pelvic region: Secondary | ICD-10-CM | POA: Diagnosis not present

## 2014-12-05 DIAGNOSIS — M9904 Segmental and somatic dysfunction of sacral region: Secondary | ICD-10-CM | POA: Diagnosis not present

## 2014-12-05 DIAGNOSIS — M9903 Segmental and somatic dysfunction of lumbar region: Secondary | ICD-10-CM | POA: Diagnosis not present

## 2014-12-05 DIAGNOSIS — M5127 Other intervertebral disc displacement, lumbosacral region: Secondary | ICD-10-CM | POA: Diagnosis not present

## 2014-12-08 DIAGNOSIS — M9903 Segmental and somatic dysfunction of lumbar region: Secondary | ICD-10-CM | POA: Diagnosis not present

## 2014-12-08 DIAGNOSIS — M5127 Other intervertebral disc displacement, lumbosacral region: Secondary | ICD-10-CM | POA: Diagnosis not present

## 2014-12-08 DIAGNOSIS — M25551 Pain in right hip: Secondary | ICD-10-CM | POA: Diagnosis not present

## 2014-12-08 DIAGNOSIS — M5136 Other intervertebral disc degeneration, lumbar region: Secondary | ICD-10-CM | POA: Diagnosis not present

## 2014-12-08 DIAGNOSIS — M9904 Segmental and somatic dysfunction of sacral region: Secondary | ICD-10-CM | POA: Diagnosis not present

## 2014-12-08 DIAGNOSIS — M9905 Segmental and somatic dysfunction of pelvic region: Secondary | ICD-10-CM | POA: Diagnosis not present

## 2014-12-08 DIAGNOSIS — M25552 Pain in left hip: Secondary | ICD-10-CM | POA: Diagnosis not present

## 2014-12-15 DIAGNOSIS — M9903 Segmental and somatic dysfunction of lumbar region: Secondary | ICD-10-CM | POA: Diagnosis not present

## 2014-12-15 DIAGNOSIS — M9905 Segmental and somatic dysfunction of pelvic region: Secondary | ICD-10-CM | POA: Diagnosis not present

## 2014-12-15 DIAGNOSIS — I10 Essential (primary) hypertension: Secondary | ICD-10-CM | POA: Diagnosis not present

## 2014-12-15 DIAGNOSIS — I2510 Atherosclerotic heart disease of native coronary artery without angina pectoris: Secondary | ICD-10-CM | POA: Diagnosis not present

## 2014-12-15 DIAGNOSIS — M9904 Segmental and somatic dysfunction of sacral region: Secondary | ICD-10-CM | POA: Diagnosis not present

## 2014-12-15 DIAGNOSIS — N3281 Overactive bladder: Secondary | ICD-10-CM | POA: Diagnosis not present

## 2014-12-15 DIAGNOSIS — E784 Other hyperlipidemia: Secondary | ICD-10-CM | POA: Diagnosis not present

## 2014-12-15 DIAGNOSIS — M5136 Other intervertebral disc degeneration, lumbar region: Secondary | ICD-10-CM | POA: Diagnosis not present

## 2014-12-15 DIAGNOSIS — M25551 Pain in right hip: Secondary | ICD-10-CM | POA: Diagnosis not present

## 2014-12-15 DIAGNOSIS — M25552 Pain in left hip: Secondary | ICD-10-CM | POA: Diagnosis not present

## 2014-12-15 DIAGNOSIS — M5127 Other intervertebral disc displacement, lumbosacral region: Secondary | ICD-10-CM | POA: Diagnosis not present

## 2014-12-15 DIAGNOSIS — N401 Enlarged prostate with lower urinary tract symptoms: Secondary | ICD-10-CM | POA: Diagnosis not present

## 2014-12-21 DIAGNOSIS — M5127 Other intervertebral disc displacement, lumbosacral region: Secondary | ICD-10-CM | POA: Diagnosis not present

## 2014-12-21 DIAGNOSIS — M9904 Segmental and somatic dysfunction of sacral region: Secondary | ICD-10-CM | POA: Diagnosis not present

## 2014-12-21 DIAGNOSIS — M25552 Pain in left hip: Secondary | ICD-10-CM | POA: Diagnosis not present

## 2014-12-21 DIAGNOSIS — M9905 Segmental and somatic dysfunction of pelvic region: Secondary | ICD-10-CM | POA: Diagnosis not present

## 2014-12-21 DIAGNOSIS — M5136 Other intervertebral disc degeneration, lumbar region: Secondary | ICD-10-CM | POA: Diagnosis not present

## 2014-12-21 DIAGNOSIS — M25551 Pain in right hip: Secondary | ICD-10-CM | POA: Diagnosis not present

## 2014-12-21 DIAGNOSIS — M9903 Segmental and somatic dysfunction of lumbar region: Secondary | ICD-10-CM | POA: Diagnosis not present

## 2014-12-27 DIAGNOSIS — M5127 Other intervertebral disc displacement, lumbosacral region: Secondary | ICD-10-CM | POA: Diagnosis not present

## 2014-12-27 DIAGNOSIS — M9904 Segmental and somatic dysfunction of sacral region: Secondary | ICD-10-CM | POA: Diagnosis not present

## 2014-12-27 DIAGNOSIS — M9905 Segmental and somatic dysfunction of pelvic region: Secondary | ICD-10-CM | POA: Diagnosis not present

## 2014-12-27 DIAGNOSIS — M9903 Segmental and somatic dysfunction of lumbar region: Secondary | ICD-10-CM | POA: Diagnosis not present

## 2014-12-27 DIAGNOSIS — M5136 Other intervertebral disc degeneration, lumbar region: Secondary | ICD-10-CM | POA: Diagnosis not present

## 2014-12-27 DIAGNOSIS — M25551 Pain in right hip: Secondary | ICD-10-CM | POA: Diagnosis not present

## 2014-12-27 DIAGNOSIS — M25552 Pain in left hip: Secondary | ICD-10-CM | POA: Diagnosis not present

## 2014-12-28 DIAGNOSIS — M9905 Segmental and somatic dysfunction of pelvic region: Secondary | ICD-10-CM | POA: Diagnosis not present

## 2014-12-28 DIAGNOSIS — M9904 Segmental and somatic dysfunction of sacral region: Secondary | ICD-10-CM | POA: Diagnosis not present

## 2014-12-28 DIAGNOSIS — M5136 Other intervertebral disc degeneration, lumbar region: Secondary | ICD-10-CM | POA: Diagnosis not present

## 2014-12-28 DIAGNOSIS — M25551 Pain in right hip: Secondary | ICD-10-CM | POA: Diagnosis not present

## 2014-12-28 DIAGNOSIS — M5127 Other intervertebral disc displacement, lumbosacral region: Secondary | ICD-10-CM | POA: Diagnosis not present

## 2014-12-28 DIAGNOSIS — M25552 Pain in left hip: Secondary | ICD-10-CM | POA: Diagnosis not present

## 2014-12-28 DIAGNOSIS — M9903 Segmental and somatic dysfunction of lumbar region: Secondary | ICD-10-CM | POA: Diagnosis not present

## 2015-01-03 DIAGNOSIS — M9904 Segmental and somatic dysfunction of sacral region: Secondary | ICD-10-CM | POA: Diagnosis not present

## 2015-01-03 DIAGNOSIS — M9903 Segmental and somatic dysfunction of lumbar region: Secondary | ICD-10-CM | POA: Diagnosis not present

## 2015-01-03 DIAGNOSIS — M25552 Pain in left hip: Secondary | ICD-10-CM | POA: Diagnosis not present

## 2015-01-03 DIAGNOSIS — M5136 Other intervertebral disc degeneration, lumbar region: Secondary | ICD-10-CM | POA: Diagnosis not present

## 2015-01-03 DIAGNOSIS — M9905 Segmental and somatic dysfunction of pelvic region: Secondary | ICD-10-CM | POA: Diagnosis not present

## 2015-01-03 DIAGNOSIS — M25551 Pain in right hip: Secondary | ICD-10-CM | POA: Diagnosis not present

## 2015-01-03 DIAGNOSIS — M5127 Other intervertebral disc displacement, lumbosacral region: Secondary | ICD-10-CM | POA: Diagnosis not present

## 2015-03-21 DIAGNOSIS — Z23 Encounter for immunization: Secondary | ICD-10-CM | POA: Diagnosis not present

## 2015-03-21 DIAGNOSIS — Z136 Encounter for screening for cardiovascular disorders: Secondary | ICD-10-CM | POA: Diagnosis not present

## 2015-03-21 DIAGNOSIS — E785 Hyperlipidemia, unspecified: Secondary | ICD-10-CM | POA: Diagnosis not present

## 2015-03-21 DIAGNOSIS — N400 Enlarged prostate without lower urinary tract symptoms: Secondary | ICD-10-CM | POA: Diagnosis not present

## 2015-03-21 DIAGNOSIS — N3281 Overactive bladder: Secondary | ICD-10-CM | POA: Diagnosis not present

## 2015-03-21 DIAGNOSIS — Z0001 Encounter for general adult medical examination with abnormal findings: Secondary | ICD-10-CM | POA: Diagnosis not present

## 2015-03-21 DIAGNOSIS — I2510 Atherosclerotic heart disease of native coronary artery without angina pectoris: Secondary | ICD-10-CM | POA: Diagnosis not present

## 2015-03-21 DIAGNOSIS — R269 Unspecified abnormalities of gait and mobility: Secondary | ICD-10-CM | POA: Diagnosis not present

## 2015-03-28 ENCOUNTER — Other Ambulatory Visit: Payer: Self-pay | Admitting: Family Medicine

## 2015-03-28 DIAGNOSIS — Z139 Encounter for screening, unspecified: Secondary | ICD-10-CM

## 2015-04-04 DIAGNOSIS — G20 Parkinson's disease: Secondary | ICD-10-CM | POA: Diagnosis not present

## 2015-04-04 DIAGNOSIS — R251 Tremor, unspecified: Secondary | ICD-10-CM | POA: Diagnosis not present

## 2015-04-10 DIAGNOSIS — G319 Degenerative disease of nervous system, unspecified: Secondary | ICD-10-CM | POA: Diagnosis not present

## 2015-04-11 DIAGNOSIS — G250 Essential tremor: Secondary | ICD-10-CM | POA: Diagnosis not present

## 2015-04-11 DIAGNOSIS — G20 Parkinson's disease: Secondary | ICD-10-CM | POA: Diagnosis not present

## 2015-04-21 ENCOUNTER — Other Ambulatory Visit (INDEPENDENT_AMBULATORY_CARE_PROVIDER_SITE_OTHER): Payer: Medicare Other | Admitting: *Deleted

## 2015-04-21 DIAGNOSIS — I10 Essential (primary) hypertension: Secondary | ICD-10-CM

## 2015-04-21 DIAGNOSIS — E785 Hyperlipidemia, unspecified: Secondary | ICD-10-CM | POA: Diagnosis not present

## 2015-04-21 LAB — LIPID PANEL
Cholesterol: 168 mg/dL (ref 0–200)
HDL: 35 mg/dL — ABNORMAL LOW (ref >39.00)
NonHDL: 133
Total CHOL/HDL Ratio: 5
Triglycerides: 203 mg/dL — ABNORMAL HIGH (ref 0.0–149.0)
VLDL: 40.6 mg/dL — ABNORMAL HIGH (ref 0.0–40.0)

## 2015-04-21 LAB — ALT: ALT: 18 U/L (ref 0–53)

## 2015-04-21 LAB — LDL CHOLESTEROL, DIRECT: Direct LDL: 98 mg/dL

## 2015-04-21 NOTE — Addendum Note (Signed)
Addended by: Eulis Foster on: 04/21/2015 08:57 AM   Modules accepted: Orders

## 2015-04-21 NOTE — Addendum Note (Signed)
Addended by: Eulis Foster on: 04/21/2015 08:58 AM   Modules accepted: Orders

## 2015-04-25 ENCOUNTER — Other Ambulatory Visit: Payer: Self-pay | Admitting: Family Medicine

## 2015-04-25 ENCOUNTER — Encounter (INDEPENDENT_AMBULATORY_CARE_PROVIDER_SITE_OTHER): Payer: Self-pay

## 2015-04-25 ENCOUNTER — Ambulatory Visit
Admission: RE | Admit: 2015-04-25 | Discharge: 2015-04-25 | Disposition: A | Discharge status: Home / Self Care | Payer: Medicare Other | Source: Ambulatory Visit | Attending: Family Medicine | Admitting: Family Medicine

## 2015-04-25 DIAGNOSIS — Z139 Encounter for screening, unspecified: Secondary | ICD-10-CM

## 2015-04-25 DIAGNOSIS — Z136 Encounter for screening for cardiovascular disorders: Secondary | ICD-10-CM | POA: Diagnosis not present

## 2015-04-26 DIAGNOSIS — I2510 Atherosclerotic heart disease of native coronary artery without angina pectoris: Secondary | ICD-10-CM | POA: Insufficient documentation

## 2015-04-26 HISTORY — DX: Atherosclerotic heart disease of native coronary artery without angina pectoris: I25.10

## 2015-04-27 ENCOUNTER — Encounter: Payer: Self-pay | Admitting: Interventional Cardiology

## 2015-04-27 ENCOUNTER — Ambulatory Visit (INDEPENDENT_AMBULATORY_CARE_PROVIDER_SITE_OTHER): Payer: Medicare Other | Admitting: Interventional Cardiology

## 2015-04-27 VITALS — BP 118/70 | HR 60 | Ht 69.0 in | Wt 216.8 lb

## 2015-04-27 DIAGNOSIS — I2510 Atherosclerotic heart disease of native coronary artery without angina pectoris: Secondary | ICD-10-CM

## 2015-04-27 DIAGNOSIS — G20 Parkinson's disease: Secondary | ICD-10-CM | POA: Insufficient documentation

## 2015-04-27 DIAGNOSIS — I714 Abdominal aortic aneurysm, without rupture, unspecified: Secondary | ICD-10-CM | POA: Insufficient documentation

## 2015-04-27 DIAGNOSIS — E785 Hyperlipidemia, unspecified: Secondary | ICD-10-CM

## 2015-04-27 DIAGNOSIS — I10 Essential (primary) hypertension: Secondary | ICD-10-CM | POA: Diagnosis not present

## 2015-04-27 DIAGNOSIS — G20A1 Parkinson's disease without dyskinesia, without mention of fluctuations: Secondary | ICD-10-CM | POA: Insufficient documentation

## 2015-04-27 HISTORY — DX: Parkinson's disease without dyskinesia, without mention of fluctuations: G20.A1

## 2015-04-27 HISTORY — DX: Parkinson's disease: G20

## 2015-04-27 NOTE — Progress Notes (Signed)
Cardiology Office Note   Date:  04/27/2015   ID:  Calvin Ramirez, DOB Aug 02, 1941, MRN [id number]  PCP:  Shirline Frees, MD  Cardiologist:  Sinclair Grooms, MD   Chief Complaint  Patient presents with  . CAD IN NATIVE ARTERY      History of Present Illness: Calvin Ramirez is a 74 y.o. male who presents for coronary artery disease with prior LAD stent (DES), hypertension, hyperlipidemia, and Parkinson's disease. Has drug-eluting stent in the proximal LAD, distal RCA, and posterolateral branch of the RCA.  He is doing well. He did have an episode of angina at the [facility] while walking up the stands to be seated for his grandson's graduation. He is otherwise had no difficulty. He denies dyspnea. He has never needed to use nitroglycerin since stent implantation. He denies syncope.  A new diagnosis is Parkinson's disease.    Past Medical History  Diagnosis Date  . Coronary artery disease   . Arthritis   . Hyperlipidemia   . Stented coronary artery 2011    x3    Past Surgical History  Procedure Laterality Date  . Cardiac catheterization  2011    3 stents  . Tonsillectomy    . Appendectomy    . Colonoscopy       Current Outpatient Prescriptions  Medication Sig Dispense Refill  . aspirin 81 MG tablet Take 81 mg by mouth daily.    Marland Kitchen atorvastatin (LIPITOR) 40 MG tablet Take 1 tablet (40 mg total) by mouth every evening. 90 tablet 3  . Multiple Vitamin (MULTIVITAMIN) tablet Take 1 tablet by mouth daily.     No current facility-administered medications for this visit.    Allergies:   Penicillins and Levaquin    Social History:  The patient  reports that he has never smoked. He has never used smokeless tobacco. He reports that he drinks alcohol. He reports that he does not use illicit drugs.   Family History:  The patient's family history includes Cancer in his father; Heart attack in his mother.    ROS:  Please see the history of present  illness.   Otherwise, review of systems are positive for decreased memory, difficulty with ambulation, and decreased energy. Based upon history, he now possibly has an abdominal aortic aneurysm..   All other systems are reviewed and negative.    PHYSICAL EXAM: VS:  BP 118/70 mmHg  Pulse 60  Ht 5\' 9"  (1.753 m)  Wt 216 lb 12.8 oz (98.34 kg)  BMI 32.00 kg/m2 , BMI Body mass index is 32 kg/(m^2). GEN: Well nourished, well developed, in no acute distress HEENT: normal Neck: no JVD, carotid bruits, or masses Cardiac: RRR; no murmurs, rubs, or gallops,no edema  Respiratory:  clear to auscultation bilaterally, normal work of breathing GI: soft, nontender, nondistended, + BS MS: no deformity or atrophy Skin: warm and dry, no rash Neuro:  Strength and sensation are intact Psych: euthymic mood, full affect   EKG:  EKG is ordered today. The ekg ordered today demonstrates sinus bradycardia but otherwise unremarkable   Recent Labs: 04/21/2015: ALT 18    Lipid Panel    Component Value Date/Time   CHOL 168 04/21/2015 0858   TRIG 203.0* 04/21/2015 0858   HDL 35.00* 04/21/2015 0858   CHOLHDL 5 04/21/2015 0858   VLDL 40.6* 04/21/2015 0858   LDLDIRECT 98.0 04/21/2015 0858      Wt Readings from Last 3 Encounters:  04/27/15 216 lb 12.8 oz (98.34  kg)  04/21/14 218 lb (98.884 kg)  12/24/11 214 lb (97.07 kg)      Other studies Reviewed: Additional studies/ records that were reviewed today include: [facility] records. Review of the above records demonstrates: We'll review records from Dr. Azalia Bilis.   ASSESSMENT AND PLAN:  Essential hypertension : Controlled  Hyperlipidemia: Last LDL was 90  CAD in native artery: Had angina on an unusually long steep walk. If there is any recurrence of angina in daily activities, he will need ischemic evaluation.  Abdominal aortic aneurysm: No information is known    Current medicines are reviewed at length with the patient today.  The  patient does not have concerns regarding medicines.  The following changes have been made:  no change. The wife asked if we could decrease the dose of his statin. His current lipid panel suggests that this would not be a good idea. We will remain patent.  Labs/ tests ordered today include:  No orders of the defined types were placed in this encounter.     Disposition:   FU with HS in 1 year  Signed, Sinclair Grooms, MD  04/27/2015 8:33 AM    [address], [place], [place]  [address] Phone: [phone]; Fax: [phone]

## 2015-04-27 NOTE — Patient Instructions (Addendum)
Medication Instructions:  Your physician recommends that you continue on your current medications as directed. Please refer to the Current Medication list given to you today.   Labwork: None   Testing/Procedures: None   Follow-Up: Your physician wants you to follow-up in: 1 year with Dr.Smith You will receive a reminder letter in the mail two months in advance. If you don't receive a letter, please call our office to schedule the follow-up appointment.   Any Other Special Instructions Will Be Listed Below (If Applicable). Your physician discussed the importance of regular exercise and recommended that you start or continue a regular exercise program for good health.   We will request a copy of your recent office visit and AAA ultrasound from [place] office

## 2015-05-18 ENCOUNTER — Other Ambulatory Visit: Payer: Medicare Other

## 2015-06-09 ENCOUNTER — Other Ambulatory Visit: Payer: Self-pay

## 2015-06-09 DIAGNOSIS — E785 Hyperlipidemia, unspecified: Secondary | ICD-10-CM

## 2015-06-09 MED ORDER — ATORVASTATIN CALCIUM 40 MG PO TABS: 40.0000 mg | ORAL_TABLET | Freq: Every evening | ORAL | Status: DC

## 2015-06-13 ENCOUNTER — Ambulatory Visit
Admission: RE | Admit: 2015-06-13 | Discharge: 2015-06-13 | Disposition: A | Discharge status: Home / Self Care | Payer: Medicare Other | Source: Ambulatory Visit | Attending: Family Medicine | Admitting: Family Medicine

## 2015-06-13 ENCOUNTER — Other Ambulatory Visit: Payer: Self-pay | Admitting: Family Medicine

## 2015-06-13 DIAGNOSIS — G20 Parkinson's disease: Secondary | ICD-10-CM | POA: Diagnosis not present

## 2015-06-13 DIAGNOSIS — T148XXA Other injury of unspecified body region, initial encounter: Secondary | ICD-10-CM

## 2015-06-13 DIAGNOSIS — S40011A Contusion of right shoulder, initial encounter: Secondary | ICD-10-CM | POA: Diagnosis not present

## 2015-07-04 DIAGNOSIS — I83893 Varicose veins of bilateral lower extremities with other complications: Secondary | ICD-10-CM | POA: Diagnosis not present

## 2015-08-04 DIAGNOSIS — H43813 Vitreous degeneration, bilateral: Secondary | ICD-10-CM | POA: Diagnosis not present

## 2015-08-04 DIAGNOSIS — Z961 Presence of intraocular lens: Secondary | ICD-10-CM | POA: Diagnosis not present

## 2015-08-04 DIAGNOSIS — H52203 Unspecified astigmatism, bilateral: Secondary | ICD-10-CM | POA: Diagnosis not present

## 2015-09-05 DIAGNOSIS — Z23 Encounter for immunization: Secondary | ICD-10-CM | POA: Diagnosis not present

## 2015-09-12 ENCOUNTER — Telehealth: Payer: Self-pay | Admitting: Interventional Cardiology

## 2015-09-12 NOTE — Telephone Encounter (Signed)
New message      Pt request to talk to Dr Thompson Caul nurse.  He would not tell me what he wanted

## 2015-09-12 NOTE — Telephone Encounter (Signed)
Returned pt call. Pt sts that he was calling for his wife Calvin Ramirez. Adv him I have already spoken with her

## 2015-09-21 DIAGNOSIS — I2510 Atherosclerotic heart disease of native coronary artery without angina pectoris: Secondary | ICD-10-CM | POA: Diagnosis not present

## 2015-09-21 DIAGNOSIS — N400 Enlarged prostate without lower urinary tract symptoms: Secondary | ICD-10-CM | POA: Diagnosis not present

## 2015-09-21 DIAGNOSIS — E785 Hyperlipidemia, unspecified: Secondary | ICD-10-CM | POA: Diagnosis not present

## 2015-09-21 DIAGNOSIS — R251 Tremor, unspecified: Secondary | ICD-10-CM | POA: Diagnosis not present

## 2015-09-27 DIAGNOSIS — L814 Other melanin hyperpigmentation: Secondary | ICD-10-CM | POA: Diagnosis not present

## 2015-09-27 DIAGNOSIS — D2262 Melanocytic nevi of left upper limb, including shoulder: Secondary | ICD-10-CM | POA: Diagnosis not present

## 2015-09-27 DIAGNOSIS — L821 Other seborrheic keratosis: Secondary | ICD-10-CM | POA: Diagnosis not present

## 2015-09-27 DIAGNOSIS — L570 Actinic keratosis: Secondary | ICD-10-CM | POA: Diagnosis not present

## 2015-09-27 DIAGNOSIS — D2261 Melanocytic nevi of right upper limb, including shoulder: Secondary | ICD-10-CM | POA: Diagnosis not present

## 2015-09-27 DIAGNOSIS — D225 Melanocytic nevi of trunk: Secondary | ICD-10-CM | POA: Diagnosis not present

## 2015-09-27 DIAGNOSIS — D1801 Hemangioma of skin and subcutaneous tissue: Secondary | ICD-10-CM | POA: Diagnosis not present

## 2015-10-31 ENCOUNTER — Emergency Department (HOSPITAL_COMMUNITY): Payer: Medicare Other

## 2015-10-31 ENCOUNTER — Encounter (HOSPITAL_COMMUNITY): Payer: Self-pay | Admitting: Adult Health

## 2015-10-31 ENCOUNTER — Emergency Department (HOSPITAL_COMMUNITY)
Admission: EM | Admit: 2015-10-31 | Discharge: 2015-11-01 | Disposition: A | Discharge status: Home / Self Care | Payer: Medicare Other | Attending: Emergency Medicine | Admitting: Emergency Medicine

## 2015-10-31 DIAGNOSIS — M1990 Unspecified osteoarthritis, unspecified site: Secondary | ICD-10-CM | POA: Diagnosis not present

## 2015-10-31 DIAGNOSIS — E785 Hyperlipidemia, unspecified: Secondary | ICD-10-CM | POA: Diagnosis not present

## 2015-10-31 DIAGNOSIS — Z880 Allergy status to penicillin: Secondary | ICD-10-CM | POA: Insufficient documentation

## 2015-10-31 DIAGNOSIS — R61 Generalized hyperhidrosis: Secondary | ICD-10-CM | POA: Insufficient documentation

## 2015-10-31 DIAGNOSIS — R0781 Pleurodynia: Secondary | ICD-10-CM | POA: Insufficient documentation

## 2015-10-31 DIAGNOSIS — Z7982 Long term (current) use of aspirin: Secondary | ICD-10-CM | POA: Diagnosis not present

## 2015-10-31 DIAGNOSIS — I2510 Atherosclerotic heart disease of native coronary artery without angina pectoris: Secondary | ICD-10-CM | POA: Diagnosis not present

## 2015-10-31 DIAGNOSIS — Z79899 Other long term (current) drug therapy: Secondary | ICD-10-CM | POA: Insufficient documentation

## 2015-10-31 DIAGNOSIS — R109 Unspecified abdominal pain: Secondary | ICD-10-CM | POA: Diagnosis present

## 2015-10-31 DIAGNOSIS — G20 Parkinson's disease: Secondary | ICD-10-CM | POA: Diagnosis not present

## 2015-10-31 DIAGNOSIS — R1010 Upper abdominal pain, unspecified: Secondary | ICD-10-CM | POA: Diagnosis not present

## 2015-10-31 DIAGNOSIS — Z955 Presence of coronary angioplasty implant and graft: Secondary | ICD-10-CM | POA: Insufficient documentation

## 2015-10-31 LAB — COMPREHENSIVE METABOLIC PANEL
ALT: 20 U/L (ref 17–63)
AST: 26 U/L (ref 15–41)
Albumin: 4 g/dL (ref 3.5–5.0)
Alkaline Phosphatase: 67 U/L (ref 38–126)
Anion gap: 9 (ref 5–15)
BUN: 21 mg/dL — ABNORMAL HIGH (ref 6–20)
CO2: 24 mmol/L (ref 22–32)
Calcium: 9.4 mg/dL (ref 8.9–10.3)
Chloride: 107 mmol/L (ref 101–111)
Creatinine, Ser: 1.02 mg/dL (ref 0.61–1.24)
GFR calc Af Amer: >60 mL/min (ref >60)
GFR calc non Af Amer: >60 mL/min (ref >60)
Glucose, Bld: 130 mg/dL — ABNORMAL HIGH (ref 65–99)
Potassium: 3.6 mmol/L (ref 3.5–5.1)
Sodium: 140 mmol/L (ref 135–145)
Total Bilirubin: 0.8 mg/dL (ref 0.3–1.2)
Total Protein: 6.8 g/dL (ref 6.5–8.1)

## 2015-10-31 LAB — URINALYSIS, ROUTINE W REFLEX MICROSCOPIC
Bilirubin Urine: NEGATIVE
Glucose, UA: NEGATIVE mg/dL
Hgb urine dipstick: NEGATIVE
Ketones, ur: NEGATIVE mg/dL
Leukocytes, UA: NEGATIVE
Nitrite: NEGATIVE
Protein, ur: NEGATIVE mg/dL
Specific Gravity, Urine: 1.022 (ref 1.005–1.030)
pH: 5.5 (ref 5.0–8.0)

## 2015-10-31 LAB — I-STAT CHEM 8, ED
BUN: 25 mg/dL — ABNORMAL HIGH (ref 6–20)
Calcium, Ion: 1.18 mmol/L (ref 1.13–1.30)
Chloride: 106 mmol/L (ref 101–111)
Creatinine, Ser: 0.9 mg/dL (ref 0.61–1.24)
Glucose, Bld: 121 mg/dL — ABNORMAL HIGH (ref 65–99)
HCT: 42 % (ref 39.0–52.0)
Hemoglobin: 14.3 g/dL (ref 13.0–17.0)
Potassium: 3.6 mmol/L (ref 3.5–5.1)
Sodium: 143 mmol/L (ref 135–145)
TCO2: 24 mmol/L (ref 0–100)

## 2015-10-31 LAB — CBC WITH DIFFERENTIAL/PLATELET
Basophils Absolute: 0 10*3/uL (ref 0.0–0.1)
Basophils Relative: 1 %
Eosinophils Absolute: 0.3 10*3/uL (ref 0.0–0.7)
Eosinophils Relative: 4 %
HCT: 40.7 % (ref 39.0–52.0)
Hemoglobin: 13.7 g/dL (ref 13.0–17.0)
Lymphocytes Relative: 25 %
Lymphs Abs: 1.9 10*3/uL (ref 0.7–4.0)
MCH: 30.1 pg (ref 26.0–34.0)
MCHC: 33.7 g/dL (ref 30.0–36.0)
MCV: 89.5 fL (ref 78.0–100.0)
Monocytes Absolute: 0.4 10*3/uL (ref 0.1–1.0)
Monocytes Relative: 6 %
Neutro Abs: 4.7 10*3/uL (ref 1.7–7.7)
Neutrophils Relative %: 64 %
Platelets: 227 10*3/uL (ref 150–400)
RBC: 4.55 MIL/uL (ref 4.22–5.81)
RDW: 13.5 % (ref 11.5–15.5)
WBC: 7.4 10*3/uL (ref 4.0–10.5)

## 2015-10-31 LAB — LIPASE, BLOOD: Lipase: 34 U/L (ref 11–51)

## 2015-10-31 LAB — I-STAT CG4 LACTIC ACID, ED: Lactic Acid, Venous: 1.69 mmol/L (ref 0.5–2.0)

## 2015-10-31 MED ORDER — MORPHINE SULFATE (PF) 4 MG/ML IV SOLN
6.0000 mg | Freq: Once | INTRAVENOUS | Status: DC
  Filled 2015-10-31: qty 2

## 2015-10-31 MED ORDER — ONDANSETRON HCL 4 MG/2ML IJ SOLN
4.0000 mg | Freq: Once | INTRAMUSCULAR | Status: DC
  Filled 2015-10-31: qty 2

## 2015-10-31 MED ORDER — IOHEXOL 350 MG/ML SOLN
100.0000 mL | Freq: Once | INTRAVENOUS | Status: AC | PRN
  Administered 2015-10-31: 100 mL via INTRAVENOUS

## 2015-10-31 NOTE — ED Notes (Addendum)
Presents from [place] in clinic sent for evaluation of right flank pain, lower right flank area, began 2 days ago and was controlled with ibuprofen, yesterday the pain was severe and pt was unable to get comfortable, any movement made pain worse, tonight pain is better, but still there and constant in lower right flank, no radiaiton no movement. denies abdominal pain, denies dizziness, denies SOB. Pain is made better with standing.  [facility] was concerned due to possible intra abd pathology or aneurysm. Never had kidney stones before. Denies urinary difficulty.

## 2015-10-31 NOTE — ED Provider Notes (Signed)
CSN: [id number]     Arrival date & time 10/31/15  1951 History   First MD Initiated Contact with Patient 10/31/15 2203     Chief Complaint  Patient presents with  . Flank Pain     (Consider location/radiation/quality/duration/timing/severity/associated sxs/prior Treatment) Patient is a 74 y.o. male presenting with flank pain. The history is provided by the patient.  Flank Pain This is a new problem. The current episode started 2 days ago. The problem occurs constantly. The problem has been gradually improving. Pertinent negatives include no chest pain, no abdominal pain, no headaches and no shortness of breath. The symptoms are aggravated by bending and twisting. He has tried nothing for the symptoms. The treatment provided no relief.   74 yo M with a chief complaint of right flank pain. The started couple days ago. Sharp severe worse with movement or palpation. Patient denies any injury. Denies trauma. Denies dysuria or increased frequency. Patient denies radiation of pain. Patient did not notice that he was diaphoretic with it but is diaphoretic on exam. Denies nausea or vomiting. Denies colicky pain. No history of prior kidney stones. Patient went to the walk-in clinic this evening where they referred him to the emergency department.  Past Medical History  Diagnosis Date  . Coronary artery disease   . Arthritis   . Hyperlipidemia   . Stented coronary artery 2011    x3   Past Surgical History  Procedure Laterality Date  . Cardiac catheterization  2011    3 stents  . Tonsillectomy    . Appendectomy    . Colonoscopy     Family History  Problem Relation Age of Onset  . Heart attack Mother   . Cancer Father    Social History  Substance Use Topics  . Smoking status: Never Smoker   . Smokeless tobacco: Never Used  . Alcohol Use: 0.0 oz/week    0 Standard drinks or equivalent per week     Comment: occ    Review of Systems  Constitutional: Negative for fever and chills.   HENT: Negative for congestion and facial swelling.   Eyes: Negative for discharge and visual disturbance.  Respiratory: Negative for shortness of breath.   Cardiovascular: Negative for chest pain and palpitations.  Gastrointestinal: Negative for vomiting, abdominal pain and diarrhea.  Genitourinary: Positive for flank pain.  Musculoskeletal: Negative for myalgias and arthralgias.  Skin: Negative for color change and rash.  Neurological: Negative for tremors, syncope and headaches.  Psychiatric/Behavioral: Negative for confusion and dysphoric mood.      Allergies  Levaquin and Penicillins  Home Medications   Prior to Admission medications   Medication Sig Start Date End Date Taking? Authorizing Provider  aspirin 81 MG tablet Take 81 mg by mouth daily.   Yes Historical Provider, MD  atorvastatin (LIPITOR) 40 MG tablet Take 1 tablet (40 mg total) by mouth every evening. 06/09/15  Yes Belva Crome, MD  ibuprofen (ADVIL,MOTRIN) 200 MG tablet Take 400 mg by mouth every 4 (four) hours as needed for moderate pain.   Yes Historical Provider, MD  Multiple Vitamin (MULTIVITAMIN) tablet Take 1 tablet by mouth every other day.    Yes Historical Provider, MD   BP 103/84 mmHg  Pulse 55  Temp(Src) 98.1 F (36.7 C) (Oral)  Resp 21  SpO2 96% Physical Exam  Constitutional: He is oriented to person, place, and time. He appears well-developed and well-nourished.  HENT:  Head: Normocephalic and atraumatic.  Eyes: EOM are normal. Pupils are  equal, round, and reactive to light.  Neck: Normal range of motion. Neck supple. No JVD present.  Cardiovascular: Normal rate and regular rhythm.  Exam reveals no gallop and no friction rub.   No murmur heard. Pulmonary/Chest: No respiratory distress. He has no wheezes.  Abdominal: He exhibits no distension. There is no tenderness. There is no rebound and no guarding.  Musculoskeletal: Normal range of motion. He exhibits tenderness (tender palpation worst  about the 11th and 12th rib on the right posterior area. ).  Neurological: He is alert and oriented to person, place, and time.  Skin: No rash noted. He is diaphoretic. No pallor.  Psychiatric: He has a normal mood and affect. His behavior is normal.  Nursing note and vitals reviewed.   ED Course  Procedures (including critical care time) Labs Review Labs Reviewed  COMPREHENSIVE METABOLIC PANEL - Abnormal; Notable for the following:    Glucose, Bld 130 (*)    BUN 21 (*)    All other components within normal limits  I-STAT CHEM 8, ED - Abnormal; Notable for the following:    BUN 25 (*)    Glucose, Bld 121 (*)    All other components within normal limits  URINALYSIS, ROUTINE W REFLEX MICROSCOPIC (NOT AT [facility])  CBC WITH DIFFERENTIAL/PLATELET  LIPASE, BLOOD  I-STAT CG4 LACTIC ACID, ED    Imaging Review No results found. I have personally reviewed and evaluated these images and lab results as part of my medical decision-making.   EKG Interpretation None      MDM   Final diagnoses:  Right flank pain    74 yo M with a chief complaint of right flank pain. Concern for possible aortic pathology. Will obtain a CT scan with contrast. Patient with no blood in the urine feel that kidney stone is unlikely.  Point tender on exam suspect musculoskeletal in nature.  Though concern for age and diaphoresis, CT angio of abdomen and pelvis to rule out ruptured AAA.  Turned over to Dr. Claudine Mouton    Medications given during this visit Medications  iohexol (OMNIPAQUE) 350 MG/ML injection 100 mL (100 mLs Intravenous Contrast Given 10/31/15 2338)    Discharge Medication List as of 11/01/2015 12:30 AM        Deno Etienne, DO 11/01/15 1354

## 2015-11-01 DIAGNOSIS — R109 Unspecified abdominal pain: Secondary | ICD-10-CM | POA: Diagnosis not present

## 2015-11-01 NOTE — ED Provider Notes (Addendum)
I was signed out patient as pending CT scan for AAA eval.  CT is negative.  Patient received morphine or pain control prior to my arrival.  He appears well and in NAD. Upon repeat evaluation, patient is point tender in the R flank and has been doing a lot of yard work recently.  Likely MSK pain.  Advised to rest for 7 days before going back out on the yard. VS remain within his normal limits and he is safe for DC.    Everlene Balls, MD 11/01/15 [phone]

## 2015-11-01 NOTE — ED Notes (Signed)
Dr. Claudine Mouton at bedside

## 2015-11-01 NOTE — ED Notes (Signed)
Pt left at this time with all belongings.  

## 2015-11-01 NOTE — Discharge Instructions (Signed)
Flank Pain Calvin Ramirez, your CT scan today was normal.  See your primary care doctor within 3 days for close follow up. If symptoms worsen, come back to the ED immediately. Thank you. Flank pain is pain in your side. The flank is the area of your side between your upper belly (abdomen) and your back. Pain in this area can be caused by many different things. [place] care and treatment will depend on the cause of your pain.  Rest as told by your doctor.  Drink enough fluids to keep your pee (urine) clear or pale yellow.  Only take medicine as told by your doctor.  Tell your doctor about any changes in your pain.  Follow up with your doctor. GET HELP RIGHT AWAY IF:   Your pain does not get better with medicine.   You have new symptoms or your symptoms get worse.  Your pain gets worse.   You have belly (abdominal) pain.   You are short of breath.   You always feel sick to your stomach (nauseous).   You keep throwing up (vomiting).   You have puffiness (swelling) in your belly.   You feel light-headed or you pass out (faint).   You have blood in your pee.  You have a fever or lasting symptoms for more than 2-3 days.  You have a fever and your symptoms suddenly get worse. MAKE SURE YOU:   Understand these instructions.  Will watch your condition.  Will get help right away if you are not doing well or get worse.   This information is not intended to replace advice given to you by your health care provider. Make sure you discuss any questions you have with your health care provider.   Document Released: 08/20/2008 Document Revised: 12/02/2014 Document Reviewed: 06/25/2012 Elsevier Interactive Patient Education [employer].

## 2015-11-06 DIAGNOSIS — M545 Low back pain: Secondary | ICD-10-CM | POA: Diagnosis not present

## 2015-12-05 DIAGNOSIS — R6889 Other general symptoms and signs: Secondary | ICD-10-CM | POA: Diagnosis not present

## 2016-02-20 DIAGNOSIS — Z7982 Long term (current) use of aspirin: Secondary | ICD-10-CM | POA: Diagnosis not present

## 2016-02-20 DIAGNOSIS — R278 Other lack of coordination: Secondary | ICD-10-CM | POA: Diagnosis not present

## 2016-02-20 DIAGNOSIS — Z955 Presence of coronary angioplasty implant and graft: Secondary | ICD-10-CM | POA: Diagnosis not present

## 2016-02-20 DIAGNOSIS — E785 Hyperlipidemia, unspecified: Secondary | ICD-10-CM | POA: Diagnosis not present

## 2016-02-20 DIAGNOSIS — I2510 Atherosclerotic heart disease of native coronary artery without angina pectoris: Secondary | ICD-10-CM | POA: Diagnosis not present

## 2016-02-20 DIAGNOSIS — Z880 Allergy status to penicillin: Secondary | ICD-10-CM | POA: Diagnosis not present

## 2016-02-20 DIAGNOSIS — Z881 Allergy status to other antibiotic agents status: Secondary | ICD-10-CM | POA: Diagnosis not present

## 2016-02-20 DIAGNOSIS — Z87891 Personal history of nicotine dependence: Secondary | ICD-10-CM | POA: Diagnosis not present

## 2016-02-20 DIAGNOSIS — R251 Tremor, unspecified: Secondary | ICD-10-CM | POA: Diagnosis not present

## 2016-02-20 DIAGNOSIS — Z79899 Other long term (current) drug therapy: Secondary | ICD-10-CM | POA: Diagnosis not present

## 2016-02-20 DIAGNOSIS — R413 Other amnesia: Secondary | ICD-10-CM | POA: Diagnosis not present

## 2016-02-28 DIAGNOSIS — E538 Deficiency of other specified B group vitamins: Secondary | ICD-10-CM | POA: Diagnosis not present

## 2016-03-06 DIAGNOSIS — E538 Deficiency of other specified B group vitamins: Secondary | ICD-10-CM | POA: Diagnosis not present

## 2016-03-13 DIAGNOSIS — E538 Deficiency of other specified B group vitamins: Secondary | ICD-10-CM | POA: Diagnosis not present

## 2016-03-20 DIAGNOSIS — E538 Deficiency of other specified B group vitamins: Secondary | ICD-10-CM | POA: Diagnosis not present

## 2016-04-17 DIAGNOSIS — E538 Deficiency of other specified B group vitamins: Secondary | ICD-10-CM | POA: Diagnosis not present

## 2016-04-30 ENCOUNTER — Other Ambulatory Visit: Payer: Self-pay | Admitting: Family Medicine

## 2016-04-30 DIAGNOSIS — I729 Aneurysm of unspecified site: Secondary | ICD-10-CM

## 2016-05-23 ENCOUNTER — Ambulatory Visit
Admission: RE | Admit: 2016-05-23 | Discharge: 2016-05-23 | Disposition: A | Discharge status: Home / Self Care | Payer: Medicare Other | Source: Ambulatory Visit | Attending: Family Medicine | Admitting: Family Medicine

## 2016-05-23 DIAGNOSIS — I729 Aneurysm of unspecified site: Secondary | ICD-10-CM

## 2016-05-23 DIAGNOSIS — I700 Atherosclerosis of aorta: Secondary | ICD-10-CM | POA: Diagnosis not present

## 2016-06-10 DIAGNOSIS — E538 Deficiency of other specified B group vitamins: Secondary | ICD-10-CM | POA: Diagnosis not present

## 2016-06-10 DIAGNOSIS — G250 Essential tremor: Secondary | ICD-10-CM | POA: Diagnosis not present

## 2016-06-10 DIAGNOSIS — E7800 Pure hypercholesterolemia, unspecified: Secondary | ICD-10-CM | POA: Diagnosis not present

## 2016-06-10 DIAGNOSIS — Z125 Encounter for screening for malignant neoplasm of prostate: Secondary | ICD-10-CM | POA: Diagnosis not present

## 2016-06-10 DIAGNOSIS — N400 Enlarged prostate without lower urinary tract symptoms: Secondary | ICD-10-CM | POA: Diagnosis not present

## 2016-06-13 DIAGNOSIS — Z8501 Personal history of malignant neoplasm of esophagus: Secondary | ICD-10-CM | POA: Diagnosis not present

## 2016-06-13 DIAGNOSIS — R140 Abdominal distension (gaseous): Secondary | ICD-10-CM | POA: Diagnosis not present

## 2016-06-13 DIAGNOSIS — Z86010 Personal history of colonic polyps: Secondary | ICD-10-CM | POA: Diagnosis not present

## 2016-06-13 DIAGNOSIS — R159 Full incontinence of feces: Secondary | ICD-10-CM | POA: Diagnosis not present

## 2016-06-13 DIAGNOSIS — R194 Change in bowel habit: Secondary | ICD-10-CM | POA: Diagnosis not present

## 2016-06-18 DIAGNOSIS — Z125 Encounter for screening for malignant neoplasm of prostate: Secondary | ICD-10-CM | POA: Diagnosis not present

## 2016-06-18 DIAGNOSIS — E538 Deficiency of other specified B group vitamins: Secondary | ICD-10-CM | POA: Diagnosis not present

## 2016-06-18 DIAGNOSIS — E7800 Pure hypercholesterolemia, unspecified: Secondary | ICD-10-CM | POA: Diagnosis not present

## 2016-06-18 DIAGNOSIS — I10 Essential (primary) hypertension: Secondary | ICD-10-CM | POA: Diagnosis not present

## 2016-07-11 DIAGNOSIS — Z86010 Personal history of colonic polyps: Secondary | ICD-10-CM | POA: Diagnosis not present

## 2016-07-11 DIAGNOSIS — R194 Change in bowel habit: Secondary | ICD-10-CM | POA: Diagnosis not present

## 2016-07-11 DIAGNOSIS — Z1211 Encounter for screening for malignant neoplasm of colon: Secondary | ICD-10-CM | POA: Diagnosis not present

## 2016-08-17 DIAGNOSIS — B349 Viral infection, unspecified: Secondary | ICD-10-CM | POA: Diagnosis not present

## 2016-08-17 DIAGNOSIS — R05 Cough: Secondary | ICD-10-CM | POA: Diagnosis not present

## 2016-09-02 ENCOUNTER — Other Ambulatory Visit: Payer: Self-pay | Admitting: *Deleted

## 2016-09-02 ENCOUNTER — Other Ambulatory Visit: Payer: Self-pay | Admitting: Interventional Cardiology

## 2016-09-02 DIAGNOSIS — E785 Hyperlipidemia, unspecified: Secondary | ICD-10-CM

## 2016-09-02 MED ORDER — ATORVASTATIN CALCIUM 40 MG PO TABS: 40.0000 mg | ORAL_TABLET | Freq: Every evening | ORAL | 0 refills | Status: DC

## 2016-09-04 ENCOUNTER — Telehealth: Payer: Self-pay | Admitting: Interventional Cardiology

## 2016-09-04 DIAGNOSIS — E785 Hyperlipidemia, unspecified: Secondary | ICD-10-CM

## 2016-09-04 NOTE — Telephone Encounter (Signed)
New message    Patient calling wants to have lab work prior to Monday appt.

## 2016-09-04 NOTE — Telephone Encounter (Signed)
Left message to call back  Orders have been placed for fasting lipid and liver panel.   Pt needs lab appt prior to 09/09/16.

## 2016-09-05 NOTE — Telephone Encounter (Signed)
Per appt schedule pt has called back and scheduled labs for 10/13.

## 2016-09-06 ENCOUNTER — Other Ambulatory Visit: Payer: Medicare Other | Admitting: *Deleted

## 2016-09-06 DIAGNOSIS — E785 Hyperlipidemia, unspecified: Secondary | ICD-10-CM | POA: Diagnosis not present

## 2016-09-06 LAB — HEPATIC FUNCTION PANEL
ALT: 14 U/L (ref 9–46)
AST: 17 U/L (ref 10–35)
Albumin: 4.4 g/dL (ref 3.6–5.1)
Alkaline Phosphatase: 62 U/L (ref 40–115)
Bilirubin, Direct: 0.1 mg/dL (ref <=0.2)
Indirect Bilirubin: 0.7 mg/dL (ref 0.2–1.2)
Total Bilirubin: 0.8 mg/dL (ref 0.2–1.2)
Total Protein: 7 g/dL (ref 6.1–8.1)

## 2016-09-06 LAB — LIPID PANEL
Cholesterol: 197 mg/dL (ref 125–200)
HDL: 32 mg/dL — ABNORMAL LOW (ref >=40)
LDL Cholesterol: 108 mg/dL (ref <130)
Total CHOL/HDL Ratio: 6.2 Ratio — ABNORMAL HIGH (ref <=5.0)
Triglycerides: 284 mg/dL — ABNORMAL HIGH (ref <150)
VLDL: 57 mg/dL — ABNORMAL HIGH (ref <30)

## 2016-09-09 ENCOUNTER — Encounter: Payer: Self-pay | Admitting: Interventional Cardiology

## 2016-09-09 ENCOUNTER — Ambulatory Visit (INDEPENDENT_AMBULATORY_CARE_PROVIDER_SITE_OTHER): Payer: Medicare Other | Admitting: Interventional Cardiology

## 2016-09-09 VITALS — BP 126/70 | HR 58 | Ht 69.0 in | Wt 217.0 lb

## 2016-09-09 DIAGNOSIS — I10 Essential (primary) hypertension: Secondary | ICD-10-CM | POA: Diagnosis not present

## 2016-09-09 DIAGNOSIS — E785 Hyperlipidemia, unspecified: Secondary | ICD-10-CM

## 2016-09-09 DIAGNOSIS — E784 Other hyperlipidemia: Secondary | ICD-10-CM

## 2016-09-09 DIAGNOSIS — I723 Aneurysm of iliac artery: Secondary | ICD-10-CM | POA: Diagnosis not present

## 2016-09-09 DIAGNOSIS — I2510 Atherosclerotic heart disease of native coronary artery without angina pectoris: Secondary | ICD-10-CM | POA: Diagnosis not present

## 2016-09-09 DIAGNOSIS — E7849 Other hyperlipidemia: Secondary | ICD-10-CM

## 2016-09-09 HISTORY — DX: Aneurysm of iliac artery: I72.3

## 2016-09-09 MED ORDER — ATORVASTATIN CALCIUM 40 MG PO TABS: 40.0000 mg | ORAL_TABLET | Freq: Every evening | ORAL | 3 refills | Status: DC

## 2016-09-09 NOTE — Progress Notes (Signed)
Cardiology Office Note    Date:  09/09/2016   ID:  [name], DOB 10/11/1941, MRN [id number]  PCP:  Shirline Frees, MD  Cardiologist: Sinclair Grooms, MD   Chief Complaint  Patient presents with  . Coronary Artery Disease    History of Present Illness:  Calvin Ramirez is a 75 y.o. male for coronary artery disease with prior LAD stent (DES), hypertension, hyperlipidemia, and Parkinson's disease. Has drug-eluting stent in the proximal LAD, distal RCA, and posterolateral branch of the RCA.  He complains of significant exertional fatigue and dyspnea. No chest discomfort is been noted. His wife states that when they go walking, his skate/sputum walking is very slow. He denies chest discomfort and dyspnea as a reason but states that he gives out very easily. He has not used nitroglycerin. He denies syncope.  Past Medical History:  Diagnosis Date  . Arthritis   . Coronary artery disease   . Hyperlipidemia   . Stented coronary artery 2011   x3    Past Surgical History:  Procedure Laterality Date  . APPENDECTOMY    . CARDIAC CATHETERIZATION  2011   3 stents  . COLONOSCOPY    . TONSILLECTOMY      Current Medications: Outpatient Medications Prior to Visit  Medication Sig Dispense Refill  . aspirin 81 MG tablet Take 81 mg by mouth daily.    Marland Kitchen atorvastatin (LIPITOR) 40 MG tablet Take 1 tablet (40 mg total) by mouth every evening. 30 tablet 0  . ibuprofen (ADVIL,MOTRIN) 200 MG tablet Take 400 mg by mouth every 4 (four) hours as needed for moderate pain.    . Multiple Vitamin (MULTIVITAMIN) tablet Take 1 tablet by mouth every other day.      No facility-administered medications prior to visit.      Allergies:   Levaquin [levofloxacin hemihydrate] and Penicillins   Social History   Social History  . Marital status: Married    Spouse name: N/A  . Number of children: N/A  . Years of education: N/A   Social History Main Topics  . Smoking status: Never Smoker    . Smokeless tobacco: Never Used  . Alcohol use 0.0 oz/week     Comment: occ  . Drug use: No  . Sexual activity: Not Asked   Other Topics Concern  . None   Social History Narrative  . None     Family History:  The patient's family history includes Cancer in his father; Heart attack in his mother.   ROS:   Please see the history of present illness.    Wife is concerned that he is slowing down. He doesn't walk very fast. He has occasional cough. He sees a neurologist at [facility] in several weeks. The concern was Parkinson's disease  All other systems reviewed and are negative.   PHYSICAL EXAM:   VS:  BP 126/70   Pulse (!) 58   Ht 5\' 9"  (1.753 m)   Wt 217 lb (98.4 kg)   BMI 32.05 kg/m    GEN: Well nourished, well developed, in no acute distress  HEENT: normal  Neck: no JVD, carotid bruits, or masses Cardiac: RRR; no murmurs, rubs, or gallops,no edema  Respiratory:  clear to auscultation bilaterally, normal work of breathing GI: soft, nontender, nondistended, + BS MS: no deformity or atrophy  Skin: warm and dry, no rash Neuro:  Alert and Oriented x 3, Strength and sensation are intact Psych: euthymic mood, full affect  Wt  Readings from Last 3 Encounters:  09/09/16 217 lb (98.4 kg)  04/27/15 216 lb 12.8 oz (98.3 kg)  04/21/14 218 lb (98.9 kg)      Studies/Labs Reviewed:   EKG:  EKG  Sinus bradycardia at 58 beats per minute and otherwise normal appearance.  Recent Labs: 10/31/2015: BUN 25; Creatinine, Ser 0.90; Hemoglobin 14.3; Platelets 227; Potassium 3.6; Sodium 143 09/06/2016: ALT 14   Lipid Panel    Component Value Date/Time   CHOL 197 09/06/2016 0952   TRIG 284 (H) 09/06/2016 0952   HDL 32 (L) 09/06/2016 0952   CHOLHDL 6.2 (H) 09/06/2016 0952   VLDL 57 (H) 09/06/2016 0952   LDLCALC 108 09/06/2016 0952   LDLDIRECT 98.0 04/21/2015 0858    Additional studies/ records that were reviewed today include:  Has 3 coronary stents. 2 in the  right coronary 1 in the LAD. The stents were placed in 2011.    ASSESSMENT:    1. CAD in native artery   2. Essential hypertension   3. Other hyperlipidemia   4. Bilateral iliac artery aneurysm (HCC)      PLAN:  In order of problems listed above:  1. Stress or Lexiscan Myoview to exclude the possibility of fatigue and exertional weakness as an anginal equivalent. 2. 2. Low salt diet. Target 140/90 mmHg or less. 3. Elevated and needs diert and exercise.Just resumed regularly taking atorvastatin. Liver and lipid panel in 6 months. Increase activity. Low fat diet. 4. Recent aortic ultrasound done and demonstrated no evidence of aneurysm with mild ectasia of both iliacs.    Medication Adjustments/Labs and Tests Ordered: Current medicines are reviewed at length with the patient today.  Concerns regarding medicines are outlined above.  Medication changes, Labs and Tests ordered today are listed in the Patient Instructions below. There are no Patient Instructions on file for this visit.   Signed, Sinclair Grooms, MD  09/09/2016 4:52 PM    [facility] [place], [place],   [address] Phone: [phone]; Fax: [phone]

## 2016-09-09 NOTE — Addendum Note (Signed)
Addended by: Thompson Grayer on: 09/09/2016 05:08 PM   Modules accepted: Orders

## 2016-09-09 NOTE — Patient Instructions (Signed)
Medication Instructions:  Your physician recommends that you continue on your current medications as directed. Please refer to the Current Medication list given to you today.   Labwork: Your physician recommends that you return for lab work in: 6 months--Lipid and Liver profiles. (this will be fasting)   Testing/Procedures: Your physician has requested that you have en exercise stress myoview. For further information please visit [email or web address]. Please follow instruction sheet, as given.    Follow-Up: Your physician wants you to follow-up in: 12 months.  You will receive a reminder letter in the mail two months in advance. If you don't receive a letter, please call our office to schedule the follow-up appointment.   Any Other Special Instructions Will Be Listed Below (If Applicable).     If you need a refill on your cardiac medications before your next appointment, please call your pharmacy.

## 2016-10-03 DIAGNOSIS — Z23 Encounter for immunization: Secondary | ICD-10-CM | POA: Diagnosis not present

## 2016-10-07 ENCOUNTER — Telehealth (HOSPITAL_COMMUNITY): Payer: Self-pay | Admitting: *Deleted

## 2016-10-07 NOTE — Telephone Encounter (Signed)
Patient given detailed instructions per Myocardial Perfusion Study Information Sheet for the test on 10/10/16 at 0945. Patient notified to arrive 15 minutes early and that it is imperative to arrive on time for appointment to keep from having the test rescheduled.  If you need to cancel or reschedule your appointment, please call the office within 24 hours of your appointment. Failure to do so may result in a cancellation of your appointment, and a $50 no show fee. Patient verbalized understanding.Derrious Bologna, [place]anae [place]

## 2016-10-10 ENCOUNTER — Ambulatory Visit (HOSPITAL_COMMUNITY): Payer: Medicare Other | Attending: Cardiology

## 2016-10-10 DIAGNOSIS — R0609 Other forms of dyspnea: Secondary | ICD-10-CM | POA: Diagnosis not present

## 2016-10-10 DIAGNOSIS — R5383 Other fatigue: Secondary | ICD-10-CM | POA: Diagnosis not present

## 2016-10-10 DIAGNOSIS — I10 Essential (primary) hypertension: Secondary | ICD-10-CM | POA: Insufficient documentation

## 2016-10-10 DIAGNOSIS — I2510 Atherosclerotic heart disease of native coronary artery without angina pectoris: Secondary | ICD-10-CM | POA: Diagnosis not present

## 2016-10-10 LAB — MYOCARDIAL PERFUSION IMAGING
Estimated workload: 8.9 METS
Exercise duration (min): 7 min
Exercise duration (sec): 15 s
LV dias vol: 145 mL (ref 62–150)
LV sys vol: 72 mL
MPHR: 144 {beats}/min
Peak HR: 144 {beats}/min
Percent HR: 90 %
RATE: 0.32
RPE: 18
Rest HR: 62 {beats}/min
SDS: 0
SRS: 1
SSS: 1
TID: 0.96

## 2016-10-10 MED ORDER — TECHNETIUM TC 99M TETROFOSMIN IV KIT
10.3000 | PACK | Freq: Once | INTRAVENOUS | Status: AC | PRN
  Administered 2016-10-10: 10.3 via INTRAVENOUS
  Filled 2016-10-10: qty 11

## 2016-10-10 MED ORDER — TECHNETIUM TC 99M TETROFOSMIN IV KIT
32.2000 | PACK | Freq: Once | INTRAVENOUS | Status: AC | PRN
  Administered 2016-10-10: 32.2 via INTRAVENOUS
  Filled 2016-10-10: qty 33

## 2016-10-28 ENCOUNTER — Telehealth: Payer: Self-pay | Admitting: Interventional Cardiology

## 2016-10-28 DIAGNOSIS — D122 Benign neoplasm of ascending colon: Secondary | ICD-10-CM | POA: Diagnosis not present

## 2016-10-28 DIAGNOSIS — Z1211 Encounter for screening for malignant neoplasm of colon: Secondary | ICD-10-CM | POA: Diagnosis not present

## 2016-10-28 DIAGNOSIS — Z86010 Personal history of colonic polyps: Secondary | ICD-10-CM | POA: Diagnosis not present

## 2016-10-28 DIAGNOSIS — K635 Polyp of colon: Secondary | ICD-10-CM | POA: Diagnosis not present

## 2016-10-28 NOTE — Telephone Encounter (Signed)
error 

## 2016-10-29 DIAGNOSIS — Z79899 Other long term (current) drug therapy: Secondary | ICD-10-CM | POA: Diagnosis not present

## 2016-10-29 DIAGNOSIS — R438 Other disturbances of smell and taste: Secondary | ICD-10-CM | POA: Diagnosis not present

## 2016-10-29 DIAGNOSIS — G520 Disorders of olfactory nerve: Secondary | ICD-10-CM | POA: Diagnosis not present

## 2016-10-29 DIAGNOSIS — R413 Other amnesia: Secondary | ICD-10-CM | POA: Diagnosis not present

## 2016-10-29 DIAGNOSIS — R4789 Other speech disturbances: Secondary | ICD-10-CM | POA: Diagnosis not present

## 2016-10-29 DIAGNOSIS — R258 Other abnormal involuntary movements: Secondary | ICD-10-CM | POA: Diagnosis not present

## 2016-10-29 DIAGNOSIS — R2689 Other abnormalities of gait and mobility: Secondary | ICD-10-CM | POA: Diagnosis not present

## 2016-10-29 DIAGNOSIS — G20 Parkinson's disease: Secondary | ICD-10-CM | POA: Diagnosis not present

## 2016-12-05 DIAGNOSIS — L821 Other seborrheic keratosis: Secondary | ICD-10-CM | POA: Diagnosis not present

## 2016-12-05 DIAGNOSIS — D2272 Melanocytic nevi of left lower limb, including hip: Secondary | ICD-10-CM | POA: Diagnosis not present

## 2016-12-05 DIAGNOSIS — D2262 Melanocytic nevi of left upper limb, including shoulder: Secondary | ICD-10-CM | POA: Diagnosis not present

## 2016-12-05 DIAGNOSIS — D2271 Melanocytic nevi of right lower limb, including hip: Secondary | ICD-10-CM | POA: Diagnosis not present

## 2016-12-05 DIAGNOSIS — D1801 Hemangioma of skin and subcutaneous tissue: Secondary | ICD-10-CM | POA: Diagnosis not present

## 2016-12-05 DIAGNOSIS — D2239 Melanocytic nevi of other parts of face: Secondary | ICD-10-CM | POA: Diagnosis not present

## 2016-12-05 DIAGNOSIS — D225 Melanocytic nevi of trunk: Secondary | ICD-10-CM | POA: Diagnosis not present

## 2016-12-05 DIAGNOSIS — L814 Other melanin hyperpigmentation: Secondary | ICD-10-CM | POA: Diagnosis not present

## 2016-12-05 DIAGNOSIS — D2261 Melanocytic nevi of right upper limb, including shoulder: Secondary | ICD-10-CM | POA: Diagnosis not present

## 2016-12-30 DIAGNOSIS — M5136 Other intervertebral disc degeneration, lumbar region: Secondary | ICD-10-CM | POA: Diagnosis not present

## 2016-12-30 DIAGNOSIS — M9904 Segmental and somatic dysfunction of sacral region: Secondary | ICD-10-CM | POA: Diagnosis not present

## 2016-12-30 DIAGNOSIS — M5127 Other intervertebral disc displacement, lumbosacral region: Secondary | ICD-10-CM | POA: Diagnosis not present

## 2016-12-30 DIAGNOSIS — M25551 Pain in right hip: Secondary | ICD-10-CM | POA: Diagnosis not present

## 2016-12-30 DIAGNOSIS — M9905 Segmental and somatic dysfunction of pelvic region: Secondary | ICD-10-CM | POA: Diagnosis not present

## 2016-12-30 DIAGNOSIS — M25552 Pain in left hip: Secondary | ICD-10-CM | POA: Diagnosis not present

## 2016-12-30 DIAGNOSIS — M9903 Segmental and somatic dysfunction of lumbar region: Secondary | ICD-10-CM | POA: Diagnosis not present

## 2016-12-31 DIAGNOSIS — M25552 Pain in left hip: Secondary | ICD-10-CM | POA: Diagnosis not present

## 2016-12-31 DIAGNOSIS — M5136 Other intervertebral disc degeneration, lumbar region: Secondary | ICD-10-CM | POA: Diagnosis not present

## 2016-12-31 DIAGNOSIS — M5127 Other intervertebral disc displacement, lumbosacral region: Secondary | ICD-10-CM | POA: Diagnosis not present

## 2016-12-31 DIAGNOSIS — M25551 Pain in right hip: Secondary | ICD-10-CM | POA: Diagnosis not present

## 2016-12-31 DIAGNOSIS — M9904 Segmental and somatic dysfunction of sacral region: Secondary | ICD-10-CM | POA: Diagnosis not present

## 2016-12-31 DIAGNOSIS — M9905 Segmental and somatic dysfunction of pelvic region: Secondary | ICD-10-CM | POA: Diagnosis not present

## 2016-12-31 DIAGNOSIS — M9903 Segmental and somatic dysfunction of lumbar region: Secondary | ICD-10-CM | POA: Diagnosis not present

## 2017-01-02 DIAGNOSIS — M5136 Other intervertebral disc degeneration, lumbar region: Secondary | ICD-10-CM | POA: Diagnosis not present

## 2017-01-02 DIAGNOSIS — M25552 Pain in left hip: Secondary | ICD-10-CM | POA: Diagnosis not present

## 2017-01-02 DIAGNOSIS — M25551 Pain in right hip: Secondary | ICD-10-CM | POA: Diagnosis not present

## 2017-01-02 DIAGNOSIS — M5127 Other intervertebral disc displacement, lumbosacral region: Secondary | ICD-10-CM | POA: Diagnosis not present

## 2017-01-02 DIAGNOSIS — M9903 Segmental and somatic dysfunction of lumbar region: Secondary | ICD-10-CM | POA: Diagnosis not present

## 2017-01-02 DIAGNOSIS — M9905 Segmental and somatic dysfunction of pelvic region: Secondary | ICD-10-CM | POA: Diagnosis not present

## 2017-01-02 DIAGNOSIS — M9904 Segmental and somatic dysfunction of sacral region: Secondary | ICD-10-CM | POA: Diagnosis not present

## 2017-01-06 DIAGNOSIS — M9905 Segmental and somatic dysfunction of pelvic region: Secondary | ICD-10-CM | POA: Diagnosis not present

## 2017-01-06 DIAGNOSIS — M5127 Other intervertebral disc displacement, lumbosacral region: Secondary | ICD-10-CM | POA: Diagnosis not present

## 2017-01-06 DIAGNOSIS — M25551 Pain in right hip: Secondary | ICD-10-CM | POA: Diagnosis not present

## 2017-01-06 DIAGNOSIS — M5136 Other intervertebral disc degeneration, lumbar region: Secondary | ICD-10-CM | POA: Diagnosis not present

## 2017-01-06 DIAGNOSIS — M25552 Pain in left hip: Secondary | ICD-10-CM | POA: Diagnosis not present

## 2017-01-06 DIAGNOSIS — M9904 Segmental and somatic dysfunction of sacral region: Secondary | ICD-10-CM | POA: Diagnosis not present

## 2017-01-06 DIAGNOSIS — M9903 Segmental and somatic dysfunction of lumbar region: Secondary | ICD-10-CM | POA: Diagnosis not present

## 2017-01-09 DIAGNOSIS — M25551 Pain in right hip: Secondary | ICD-10-CM | POA: Diagnosis not present

## 2017-01-09 DIAGNOSIS — M5127 Other intervertebral disc displacement, lumbosacral region: Secondary | ICD-10-CM | POA: Diagnosis not present

## 2017-01-09 DIAGNOSIS — M9903 Segmental and somatic dysfunction of lumbar region: Secondary | ICD-10-CM | POA: Diagnosis not present

## 2017-01-09 DIAGNOSIS — M9905 Segmental and somatic dysfunction of pelvic region: Secondary | ICD-10-CM | POA: Diagnosis not present

## 2017-01-09 DIAGNOSIS — M25552 Pain in left hip: Secondary | ICD-10-CM | POA: Diagnosis not present

## 2017-01-09 DIAGNOSIS — M9904 Segmental and somatic dysfunction of sacral region: Secondary | ICD-10-CM | POA: Diagnosis not present

## 2017-01-09 DIAGNOSIS — M5136 Other intervertebral disc degeneration, lumbar region: Secondary | ICD-10-CM | POA: Diagnosis not present

## 2017-01-13 DIAGNOSIS — M5136 Other intervertebral disc degeneration, lumbar region: Secondary | ICD-10-CM | POA: Diagnosis not present

## 2017-01-13 DIAGNOSIS — M25551 Pain in right hip: Secondary | ICD-10-CM | POA: Diagnosis not present

## 2017-01-13 DIAGNOSIS — M25552 Pain in left hip: Secondary | ICD-10-CM | POA: Diagnosis not present

## 2017-01-13 DIAGNOSIS — M9905 Segmental and somatic dysfunction of pelvic region: Secondary | ICD-10-CM | POA: Diagnosis not present

## 2017-01-13 DIAGNOSIS — M9903 Segmental and somatic dysfunction of lumbar region: Secondary | ICD-10-CM | POA: Diagnosis not present

## 2017-01-13 DIAGNOSIS — M9904 Segmental and somatic dysfunction of sacral region: Secondary | ICD-10-CM | POA: Diagnosis not present

## 2017-01-13 DIAGNOSIS — M5127 Other intervertebral disc displacement, lumbosacral region: Secondary | ICD-10-CM | POA: Diagnosis not present

## 2017-01-16 DIAGNOSIS — M5127 Other intervertebral disc displacement, lumbosacral region: Secondary | ICD-10-CM | POA: Diagnosis not present

## 2017-01-16 DIAGNOSIS — M9904 Segmental and somatic dysfunction of sacral region: Secondary | ICD-10-CM | POA: Diagnosis not present

## 2017-01-16 DIAGNOSIS — M9903 Segmental and somatic dysfunction of lumbar region: Secondary | ICD-10-CM | POA: Diagnosis not present

## 2017-01-16 DIAGNOSIS — M9905 Segmental and somatic dysfunction of pelvic region: Secondary | ICD-10-CM | POA: Diagnosis not present

## 2017-01-16 DIAGNOSIS — M25551 Pain in right hip: Secondary | ICD-10-CM | POA: Diagnosis not present

## 2017-01-16 DIAGNOSIS — M25552 Pain in left hip: Secondary | ICD-10-CM | POA: Diagnosis not present

## 2017-01-16 DIAGNOSIS — M5136 Other intervertebral disc degeneration, lumbar region: Secondary | ICD-10-CM | POA: Diagnosis not present

## 2017-01-20 DIAGNOSIS — M5136 Other intervertebral disc degeneration, lumbar region: Secondary | ICD-10-CM | POA: Diagnosis not present

## 2017-01-20 DIAGNOSIS — M9905 Segmental and somatic dysfunction of pelvic region: Secondary | ICD-10-CM | POA: Diagnosis not present

## 2017-01-20 DIAGNOSIS — M9903 Segmental and somatic dysfunction of lumbar region: Secondary | ICD-10-CM | POA: Diagnosis not present

## 2017-01-20 DIAGNOSIS — M25552 Pain in left hip: Secondary | ICD-10-CM | POA: Diagnosis not present

## 2017-01-20 DIAGNOSIS — M5127 Other intervertebral disc displacement, lumbosacral region: Secondary | ICD-10-CM | POA: Diagnosis not present

## 2017-01-20 DIAGNOSIS — M25551 Pain in right hip: Secondary | ICD-10-CM | POA: Diagnosis not present

## 2017-01-20 DIAGNOSIS — M9904 Segmental and somatic dysfunction of sacral region: Secondary | ICD-10-CM | POA: Diagnosis not present

## 2017-01-23 DIAGNOSIS — M25551 Pain in right hip: Secondary | ICD-10-CM | POA: Diagnosis not present

## 2017-01-23 DIAGNOSIS — M5136 Other intervertebral disc degeneration, lumbar region: Secondary | ICD-10-CM | POA: Diagnosis not present

## 2017-01-23 DIAGNOSIS — M25552 Pain in left hip: Secondary | ICD-10-CM | POA: Diagnosis not present

## 2017-01-23 DIAGNOSIS — M9904 Segmental and somatic dysfunction of sacral region: Secondary | ICD-10-CM | POA: Diagnosis not present

## 2017-01-23 DIAGNOSIS — M9905 Segmental and somatic dysfunction of pelvic region: Secondary | ICD-10-CM | POA: Diagnosis not present

## 2017-01-23 DIAGNOSIS — M5127 Other intervertebral disc displacement, lumbosacral region: Secondary | ICD-10-CM | POA: Diagnosis not present

## 2017-01-23 DIAGNOSIS — M9903 Segmental and somatic dysfunction of lumbar region: Secondary | ICD-10-CM | POA: Diagnosis not present

## 2017-01-28 DIAGNOSIS — M9903 Segmental and somatic dysfunction of lumbar region: Secondary | ICD-10-CM | POA: Diagnosis not present

## 2017-01-28 DIAGNOSIS — M5136 Other intervertebral disc degeneration, lumbar region: Secondary | ICD-10-CM | POA: Diagnosis not present

## 2017-01-28 DIAGNOSIS — M25552 Pain in left hip: Secondary | ICD-10-CM | POA: Diagnosis not present

## 2017-01-28 DIAGNOSIS — M25551 Pain in right hip: Secondary | ICD-10-CM | POA: Diagnosis not present

## 2017-01-28 DIAGNOSIS — M9904 Segmental and somatic dysfunction of sacral region: Secondary | ICD-10-CM | POA: Diagnosis not present

## 2017-01-28 DIAGNOSIS — M9905 Segmental and somatic dysfunction of pelvic region: Secondary | ICD-10-CM | POA: Diagnosis not present

## 2017-01-28 DIAGNOSIS — M5127 Other intervertebral disc displacement, lumbosacral region: Secondary | ICD-10-CM | POA: Diagnosis not present

## 2017-02-05 DIAGNOSIS — M25552 Pain in left hip: Secondary | ICD-10-CM | POA: Diagnosis not present

## 2017-02-05 DIAGNOSIS — M25551 Pain in right hip: Secondary | ICD-10-CM | POA: Diagnosis not present

## 2017-02-05 DIAGNOSIS — M5127 Other intervertebral disc displacement, lumbosacral region: Secondary | ICD-10-CM | POA: Diagnosis not present

## 2017-02-05 DIAGNOSIS — M9905 Segmental and somatic dysfunction of pelvic region: Secondary | ICD-10-CM | POA: Diagnosis not present

## 2017-02-05 DIAGNOSIS — M9903 Segmental and somatic dysfunction of lumbar region: Secondary | ICD-10-CM | POA: Diagnosis not present

## 2017-02-05 DIAGNOSIS — M5136 Other intervertebral disc degeneration, lumbar region: Secondary | ICD-10-CM | POA: Diagnosis not present

## 2017-02-05 DIAGNOSIS — M9904 Segmental and somatic dysfunction of sacral region: Secondary | ICD-10-CM | POA: Diagnosis not present

## 2017-02-12 DIAGNOSIS — M25551 Pain in right hip: Secondary | ICD-10-CM | POA: Diagnosis not present

## 2017-02-12 DIAGNOSIS — M5127 Other intervertebral disc displacement, lumbosacral region: Secondary | ICD-10-CM | POA: Diagnosis not present

## 2017-02-12 DIAGNOSIS — M25552 Pain in left hip: Secondary | ICD-10-CM | POA: Diagnosis not present

## 2017-02-12 DIAGNOSIS — M9905 Segmental and somatic dysfunction of pelvic region: Secondary | ICD-10-CM | POA: Diagnosis not present

## 2017-02-12 DIAGNOSIS — M5136 Other intervertebral disc degeneration, lumbar region: Secondary | ICD-10-CM | POA: Diagnosis not present

## 2017-02-12 DIAGNOSIS — M9904 Segmental and somatic dysfunction of sacral region: Secondary | ICD-10-CM | POA: Diagnosis not present

## 2017-02-12 DIAGNOSIS — M9903 Segmental and somatic dysfunction of lumbar region: Secondary | ICD-10-CM | POA: Diagnosis not present

## 2017-02-19 DIAGNOSIS — M9905 Segmental and somatic dysfunction of pelvic region: Secondary | ICD-10-CM | POA: Diagnosis not present

## 2017-02-19 DIAGNOSIS — M5136 Other intervertebral disc degeneration, lumbar region: Secondary | ICD-10-CM | POA: Diagnosis not present

## 2017-02-19 DIAGNOSIS — M9904 Segmental and somatic dysfunction of sacral region: Secondary | ICD-10-CM | POA: Diagnosis not present

## 2017-02-19 DIAGNOSIS — M25552 Pain in left hip: Secondary | ICD-10-CM | POA: Diagnosis not present

## 2017-02-19 DIAGNOSIS — M9903 Segmental and somatic dysfunction of lumbar region: Secondary | ICD-10-CM | POA: Diagnosis not present

## 2017-02-19 DIAGNOSIS — M5127 Other intervertebral disc displacement, lumbosacral region: Secondary | ICD-10-CM | POA: Diagnosis not present

## 2017-02-19 DIAGNOSIS — M25551 Pain in right hip: Secondary | ICD-10-CM | POA: Diagnosis not present

## 2017-03-10 ENCOUNTER — Other Ambulatory Visit: Payer: Medicare Other | Admitting: *Deleted

## 2017-03-10 DIAGNOSIS — E7800 Pure hypercholesterolemia, unspecified: Secondary | ICD-10-CM

## 2017-03-10 DIAGNOSIS — I10 Essential (primary) hypertension: Secondary | ICD-10-CM | POA: Diagnosis not present

## 2017-03-10 LAB — HEPATIC FUNCTION PANEL
ALT: 18 IU/L (ref 0–44)
AST: 18 IU/L (ref 0–40)
Albumin: 4.6 g/dL (ref 3.5–4.8)
Alkaline Phosphatase: 67 IU/L (ref 39–117)
Bilirubin Total: 0.8 mg/dL (ref 0.0–1.2)
Bilirubin, Direct: 0.18 mg/dL (ref 0.00–0.40)
Total Protein: 6.7 g/dL (ref 6.0–8.5)

## 2017-03-10 LAB — LIPID PANEL
Chol/HDL Ratio: 5.9 ratio — ABNORMAL HIGH (ref 0.0–5.0)
Cholesterol, Total: 182 mg/dL (ref 100–199)
HDL: 31 mg/dL — ABNORMAL LOW (ref >39)
LDL Calculated: 94 mg/dL (ref 0–99)
Triglycerides: 286 mg/dL — ABNORMAL HIGH (ref 0–149)
VLDL Cholesterol Cal: 57 mg/dL — ABNORMAL HIGH (ref 5–40)

## 2017-03-10 NOTE — Addendum Note (Signed)
Addended by: Eulis Foster on: 03/10/2017 07:54 AM   Modules accepted: Orders

## 2017-05-16 DIAGNOSIS — E7800 Pure hypercholesterolemia, unspecified: Secondary | ICD-10-CM | POA: Diagnosis not present

## 2017-05-16 DIAGNOSIS — M545 Low back pain: Secondary | ICD-10-CM | POA: Diagnosis not present

## 2017-05-16 DIAGNOSIS — I2510 Atherosclerotic heart disease of native coronary artery without angina pectoris: Secondary | ICD-10-CM | POA: Diagnosis not present

## 2017-05-16 DIAGNOSIS — R251 Tremor, unspecified: Secondary | ICD-10-CM | POA: Diagnosis not present

## 2017-05-16 DIAGNOSIS — M6740 Ganglion, unspecified site: Secondary | ICD-10-CM | POA: Diagnosis not present

## 2017-05-16 DIAGNOSIS — E538 Deficiency of other specified B group vitamins: Secondary | ICD-10-CM | POA: Diagnosis not present

## 2017-06-10 DIAGNOSIS — E538 Deficiency of other specified B group vitamins: Secondary | ICD-10-CM | POA: Diagnosis not present

## 2017-06-10 DIAGNOSIS — E7800 Pure hypercholesterolemia, unspecified: Secondary | ICD-10-CM | POA: Diagnosis not present

## 2017-06-10 DIAGNOSIS — M545 Low back pain: Secondary | ICD-10-CM | POA: Diagnosis not present

## 2017-06-10 DIAGNOSIS — Z1389 Encounter for screening for other disorder: Secondary | ICD-10-CM | POA: Diagnosis not present

## 2017-06-10 DIAGNOSIS — I2510 Atherosclerotic heart disease of native coronary artery without angina pectoris: Secondary | ICD-10-CM | POA: Diagnosis not present

## 2017-06-10 DIAGNOSIS — R251 Tremor, unspecified: Secondary | ICD-10-CM | POA: Diagnosis not present

## 2017-07-09 ENCOUNTER — Telehealth: Payer: Self-pay | Admitting: Interventional Cardiology

## 2017-07-09 DIAGNOSIS — E785 Hyperlipidemia, unspecified: Secondary | ICD-10-CM

## 2017-07-09 DIAGNOSIS — I2510 Atherosclerotic heart disease of native coronary artery without angina pectoris: Secondary | ICD-10-CM

## 2017-07-09 NOTE — Telephone Encounter (Signed)
New message    Pt is calling asking if he needs lab work before yearly appt in October.

## 2017-07-09 NOTE — Telephone Encounter (Signed)
Notes recorded by Belva Crome, MD on 03/11/2017 at 12:33 PM EDT Let the patient know the liver and lipid panel are improved. LDL is 94. Target is 70. Continue efforts to better control both the triglyceride and LDL by decreasing saturated fats in the diet(animal fats, butter, dairy, etc.). Repeat a liver and lipid panel in 6 months. A copy will be sent to Shirline Frees, MD   As mentioned above, from pts last lab done in our office, Dr Tamala Julian wanted him to have a repeat liver and lipid x 6 months.  Pt is scheduled to see Dr Tamala Julian on 10/18, and would like his lab appt to be scheduled a couple of days prior too this appt.  Scheduled the pt a lab appt for 10/15, to recheck lipid and liver function.  Pt is aware to come fasting to this lab appt.  Pt verbalized understanding and agrees with this plan.

## 2017-09-01 ENCOUNTER — Encounter: Payer: Self-pay | Admitting: Interventional Cardiology

## 2017-09-08 ENCOUNTER — Other Ambulatory Visit: Payer: Medicare Other

## 2017-09-09 ENCOUNTER — Other Ambulatory Visit: Payer: Medicare Other | Admitting: *Deleted

## 2017-09-09 DIAGNOSIS — E785 Hyperlipidemia, unspecified: Secondary | ICD-10-CM

## 2017-09-09 DIAGNOSIS — I2510 Atherosclerotic heart disease of native coronary artery without angina pectoris: Secondary | ICD-10-CM | POA: Diagnosis not present

## 2017-09-09 LAB — HEPATIC FUNCTION PANEL
ALT: 19 IU/L (ref 0–44)
AST: 20 IU/L (ref 0–40)
Albumin: 4.6 g/dL (ref 3.5–4.8)
Alkaline Phosphatase: 65 IU/L (ref 39–117)
Bilirubin Total: 0.7 mg/dL (ref 0.0–1.2)
Bilirubin, Direct: 0.14 mg/dL (ref 0.00–0.40)
Total Protein: 7 g/dL (ref 6.0–8.5)

## 2017-09-09 LAB — LIPID PANEL
Chol/HDL Ratio: 4.3 ratio (ref 0.0–5.0)
Cholesterol, Total: 162 mg/dL (ref 100–199)
HDL: 38 mg/dL — ABNORMAL LOW (ref >39)
LDL Calculated: 93 mg/dL (ref 0–99)
Triglycerides: 153 mg/dL — ABNORMAL HIGH (ref 0–149)
VLDL Cholesterol Cal: 31 mg/dL (ref 5–40)

## 2017-09-11 ENCOUNTER — Ambulatory Visit (INDEPENDENT_AMBULATORY_CARE_PROVIDER_SITE_OTHER): Payer: Medicare Other | Admitting: Interventional Cardiology

## 2017-09-11 ENCOUNTER — Encounter (INDEPENDENT_AMBULATORY_CARE_PROVIDER_SITE_OTHER): Payer: Self-pay

## 2017-09-11 ENCOUNTER — Encounter: Payer: Self-pay | Admitting: Interventional Cardiology

## 2017-09-11 VITALS — BP 138/74 | HR 60 | Ht 70.5 in | Wt 216.8 lb

## 2017-09-11 DIAGNOSIS — I10 Essential (primary) hypertension: Secondary | ICD-10-CM | POA: Diagnosis not present

## 2017-09-11 DIAGNOSIS — I723 Aneurysm of iliac artery: Secondary | ICD-10-CM | POA: Diagnosis not present

## 2017-09-11 DIAGNOSIS — I2510 Atherosclerotic heart disease of native coronary artery without angina pectoris: Secondary | ICD-10-CM

## 2017-09-11 DIAGNOSIS — E785 Hyperlipidemia, unspecified: Secondary | ICD-10-CM | POA: Diagnosis not present

## 2017-09-11 NOTE — Patient Instructions (Signed)
Medication Instructions:  1) Dr. Tamala Julian would like to discontinue Atorvastatin and start Rosuvastatin 40mg  once daily.  Let us know once you have decided whether to switch or not.  Labwork: Lipid and liver will depend on if you decide to switch medications.  Testing/Procedures: None  Follow-Up: Your physician wants you to follow-up in: 1 year with Dr. Tamala Julian.  You will receive a reminder letter in the mail two months in advance. If you don't receive a letter, please call our office to schedule the follow-up appointment.   Any Other Special Instructions Will Be Listed Below (If Applicable).     If you need a refill on your cardiac medications before your next appointment, please call your pharmacy.

## 2017-09-11 NOTE — Progress Notes (Signed)
Cardiology Office Note    Date:  09/11/2017   ID:  Calvin Ramirez, DOB [birth year], MRN [id number]  PCP:  Shirline Frees, MD  Cardiologist: Sinclair Grooms, MD   Chief Complaint  Patient presents with  . Coronary Artery Disease    History of Present Illness:  Calvin Ramirez is a 76 y.o. male for coronary artery disease with prior LAD stent (DES), hypertension, hyperlipidemia, and Parkinson's disease. Has drug-eluting stent in the proximal LAD, distal RCA, and posterolateral branch of the RCA.  He has no complaints. His lipids are not at target. We attempted to increase intensity by going to rosuvastatin 40 mg per day but this never occurred. He is having increasing difficulty maintaining a heart healthy diet. Fast foods and inactivity are also making target achievement difficult.  He has rare episodes of chest tightness with heavy exertion otherwise no ischemic complaints.   Past Medical History:  Diagnosis Date  . Arthritis   . Coronary artery disease   . Hyperlipidemia   . Stented coronary artery 2011   x3    Past Surgical History:  Procedure Laterality Date  . APPENDECTOMY    . CARDIAC CATHETERIZATION  2011   3 stents  . COLONOSCOPY    . TONSILLECTOMY      Current Medications: Outpatient Medications Prior to Visit  Medication Sig Dispense Refill  . aspirin 81 MG tablet Take 81 mg by mouth daily.    Marland Kitchen atorvastatin (LIPITOR) 40 MG tablet Take 1 tablet (40 mg total) by mouth every evening. 90 tablet 3  . Multiple Vitamin (MULTIVITAMIN) tablet Take 1 tablet by mouth every other day.     . ibuprofen (ADVIL,MOTRIN) 200 MG tablet Take 400 mg by mouth every 4 (four) hours as needed for moderate pain.    Marland Kitchen ipratropium (ATROVENT) 0.06 % nasal spray Place 2 sprays into both nostrils 4 (four) times daily as needed for allergies or congestion.  0   No facility-administered medications prior to visit.      Allergies:   Levaquin [levofloxacin hemihydrate] and  Penicillins   Social History   Social History  . Marital status: Married    Spouse name: N/A  . Number of children: N/A  . Years of education: N/A   Social History Main Topics  . Smoking status: Never Smoker  . Smokeless tobacco: Never Used  . Alcohol use 0.0 oz/week     Comment: occ  . Drug use: No  . Sexual activity: Not Asked   Other Topics Concern  . None   Social History Narrative  . None     Family History:  The patient's family history includes Cancer in his father; Heart attack in his mother.   ROS:   Please see the history of present illness.    Low back discomfort. Otherwise no specific complaints. Does have Parkinson's and a tremor.  All other systems reviewed and are negative.   PHYSICAL EXAM:   VS:  BP 138/74 (BP Location: Left Arm)   Pulse 60   Ht 5' 10.5" (1.791 m)   Wt 216 lb 12.8 oz (98.3 kg)   BMI 30.67 kg/m    GEN: Well nourished, well developed, in no acute distress  HEENT: normal  Neck: no JVD, carotid bruits, or masses Cardiac: RRR; no murmurs, rubs, or gallops,no edema  Respiratory:  clear to auscultation bilaterally, normal work of breathing GI: soft, nontender, nondistended, + BS MS: no deformity or atrophy  Skin: warm and dry, no rash  Neuro:  Alert and Oriented x 3, Strength and sensation are intact Psych: euthymic mood, full affect  Wt Readings from Last 3 Encounters:  09/11/17 216 lb 12.8 oz (98.3 kg)  10/10/16 217 lb (98.4 kg)  09/09/16 217 lb (98.4 kg)      Studies/Labs Reviewed:   EKG:  EKG  Sinus bradycardia 60 bpm, sinus arrhythmia, and otherwise normal appearance, there is no change when compared to prior  Recent Labs: 09/09/2017: ALT 19   Lipid Panel    Component Value Date/Time   CHOL 162 09/09/2017 0920   TRIG 153 (H) 09/09/2017 0920   HDL 38 (L) 09/09/2017 0920   CHOLHDL 4.3 09/09/2017 0920   CHOLHDL 6.2 (H) 09/06/2016 0952   VLDL 57 (H) 09/06/2016 0952   LDLCALC 93 09/09/2017 0920   LDLDIRECT 98.0  04/21/2015 0858    Additional studies/ records that were reviewed today include:  None    ASSESSMENT:    1. CAD in native artery   2. Hyperlipidemia, unspecified hyperlipidemia type   3. Bilateral iliac artery aneurysm ([place])   4. Essential hypertension      PLAN:  In order of problems listed above:  1. Stable angina. Physically active. 2. LDL cholesterol is 93. He had a similar value last year. We attempted to increase intensity by adding rosuvastatin 40 mg per day and discontinuing atorvastatin 40 mg but this never happened. We will intensify diet control and exercise. He will let us know if further increase in medication is reasonable after he discusses this with his wife. 3. Dyspnea stubby and investigative further. Not sure where the data. A CT with contrast of the abdomen and pelvis demonstrated no evidence of aneurysm in 2016.  Plan aerobic activity. Low fat diet rich in green leafy vegetables. Intensify statin therapy if the patient allows [place]. One-year follow-up.  Medication Adjustments/Labs and Tests Ordered: Current medicines are reviewed at length with the patient today.  Concerns regarding medicines are outlined above.  Medication changes, Labs and Tests ordered today are listed in the Patient Instructions below. Patient Instructions  Medication Instructions:  1) Dr. Tamala Julian would like to discontinue Atorvastatin and start Rosuvastatin 40mg  once daily.  Let us know once you have decided whether to switch or not.  Labwork: Lipid and liver will depend on if you decide to switch medications.  Testing/Procedures: None  Follow-Up: Your physician wants you to follow-up in: 1 year with Dr. Tamala Julian.  You will receive a reminder letter in the mail two months in advance. If you don't receive a letter, please call our office to schedule the follow-up appointment.   Any Other Special Instructions Will Be Listed Below (If Applicable).     If you need a refill on your cardiac  medications before your next appointment, please call your pharmacy.      Signed, Sinclair Grooms, MD  09/11/2017 11:03 AM    [facility] [place], [place], [place]  [address] Phone: [phone]; Fax: [phone]

## 2017-09-12 ENCOUNTER — Telehealth: Payer: Self-pay | Admitting: Interventional Cardiology

## 2017-09-12 DIAGNOSIS — E785 Hyperlipidemia, unspecified: Secondary | ICD-10-CM

## 2017-09-12 MED ORDER — ROSUVASTATIN CALCIUM 40 MG PO TABS: 40.0000 mg | ORAL_TABLET | Freq: Every day | ORAL | 3 refills | Status: DC

## 2017-09-12 NOTE — Telephone Encounter (Signed)
Spoke with pt and he decided to move forward with starting Rosuvastatin as suggested by Dr. Tamala Julian yesterday.  Advised pt to stop Atorvastatin and start Rosuvastatin 40mg  once daily.  Scheduled pt to have follow up labs on 12/18.  Pt verbalized understanding and was in agreement with this plan.

## 2017-09-12 NOTE — Telephone Encounter (Signed)
New message    Patient wants to be started on start Rosuvastatin . Please call

## 2017-09-17 DIAGNOSIS — Z23 Encounter for immunization: Secondary | ICD-10-CM | POA: Diagnosis not present

## 2017-11-07 DIAGNOSIS — E7800 Pure hypercholesterolemia, unspecified: Secondary | ICD-10-CM | POA: Diagnosis not present

## 2017-11-07 DIAGNOSIS — M545 Low back pain: Secondary | ICD-10-CM | POA: Diagnosis not present

## 2017-11-07 DIAGNOSIS — I2510 Atherosclerotic heart disease of native coronary artery without angina pectoris: Secondary | ICD-10-CM | POA: Diagnosis not present

## 2017-11-11 ENCOUNTER — Other Ambulatory Visit: Payer: Medicare Other

## 2017-11-21 ENCOUNTER — Other Ambulatory Visit: Payer: Medicare Other | Admitting: *Deleted

## 2017-11-21 DIAGNOSIS — E785 Hyperlipidemia, unspecified: Secondary | ICD-10-CM | POA: Diagnosis not present

## 2017-11-21 LAB — LIPID PANEL
Chol/HDL Ratio: 3.7 ratio (ref 0.0–5.0)
Cholesterol, Total: 160 mg/dL (ref 100–199)
HDL: 43 mg/dL (ref >39)
LDL Calculated: 75 mg/dL (ref 0–99)
Triglycerides: 208 mg/dL — ABNORMAL HIGH (ref 0–149)
VLDL Cholesterol Cal: 42 mg/dL — ABNORMAL HIGH (ref 5–40)

## 2017-11-21 LAB — HEPATIC FUNCTION PANEL
ALT: 18 IU/L (ref 0–44)
AST: 23 IU/L (ref 0–40)
Albumin: 4.9 g/dL — ABNORMAL HIGH (ref 3.5–4.8)
Alkaline Phosphatase: 65 IU/L (ref 39–117)
Bilirubin Total: 0.8 mg/dL (ref 0.0–1.2)
Bilirubin, Direct: 0.22 mg/dL (ref 0.00–0.40)
Total Protein: 7.3 g/dL (ref 6.0–8.5)

## 2018-02-13 DIAGNOSIS — R7989 Other specified abnormal findings of blood chemistry: Secondary | ICD-10-CM | POA: Diagnosis not present

## 2018-02-13 DIAGNOSIS — I110 Hypertensive heart disease with heart failure: Secondary | ICD-10-CM | POA: Diagnosis not present

## 2018-02-13 DIAGNOSIS — R0689 Other abnormalities of breathing: Secondary | ICD-10-CM | POA: Diagnosis not present

## 2018-02-13 DIAGNOSIS — R748 Abnormal levels of other serum enzymes: Secondary | ICD-10-CM | POA: Diagnosis not present

## 2018-02-13 DIAGNOSIS — I5031 Acute diastolic (congestive) heart failure: Secondary | ICD-10-CM | POA: Diagnosis present

## 2018-02-13 DIAGNOSIS — Z881 Allergy status to other antibiotic agents status: Secondary | ICD-10-CM | POA: Diagnosis not present

## 2018-02-13 DIAGNOSIS — R079 Chest pain, unspecified: Secondary | ICD-10-CM | POA: Diagnosis not present

## 2018-02-13 DIAGNOSIS — I2510 Atherosclerotic heart disease of native coronary artery without angina pectoris: Secondary | ICD-10-CM | POA: Diagnosis not present

## 2018-02-13 DIAGNOSIS — Z8249 Family history of ischemic heart disease and other diseases of the circulatory system: Secondary | ICD-10-CM | POA: Diagnosis not present

## 2018-02-13 DIAGNOSIS — I083 Combined rheumatic disorders of mitral, aortic and tricuspid valves: Secondary | ICD-10-CM | POA: Diagnosis not present

## 2018-02-13 DIAGNOSIS — Z79899 Other long term (current) drug therapy: Secondary | ICD-10-CM | POA: Diagnosis not present

## 2018-02-13 DIAGNOSIS — I252 Old myocardial infarction: Secondary | ICD-10-CM | POA: Diagnosis not present

## 2018-02-13 DIAGNOSIS — E876 Hypokalemia: Secondary | ICD-10-CM | POA: Diagnosis not present

## 2018-02-13 DIAGNOSIS — Z955 Presence of coronary angioplasty implant and graft: Secondary | ICD-10-CM | POA: Diagnosis not present

## 2018-02-13 DIAGNOSIS — I5041 Acute combined systolic (congestive) and diastolic (congestive) heart failure: Secondary | ICD-10-CM | POA: Diagnosis not present

## 2018-02-13 DIAGNOSIS — I161 Hypertensive emergency: Secondary | ICD-10-CM | POA: Diagnosis not present

## 2018-02-13 DIAGNOSIS — Z880 Allergy status to penicillin: Secondary | ICD-10-CM | POA: Diagnosis not present

## 2018-02-13 DIAGNOSIS — E785 Hyperlipidemia, unspecified: Secondary | ICD-10-CM | POA: Diagnosis not present

## 2018-02-13 DIAGNOSIS — R0602 Shortness of breath: Secondary | ICD-10-CM | POA: Diagnosis not present

## 2018-02-13 DIAGNOSIS — I491 Atrial premature depolarization: Secondary | ICD-10-CM | POA: Diagnosis not present

## 2018-02-13 DIAGNOSIS — I5033 Acute on chronic diastolic (congestive) heart failure: Secondary | ICD-10-CM | POA: Diagnosis not present

## 2018-02-13 DIAGNOSIS — Z7982 Long term (current) use of aspirin: Secondary | ICD-10-CM | POA: Diagnosis not present

## 2018-02-13 DIAGNOSIS — I509 Heart failure, unspecified: Secondary | ICD-10-CM | POA: Diagnosis not present

## 2018-02-13 DIAGNOSIS — Z743 Need for continuous supervision: Secondary | ICD-10-CM | POA: Diagnosis not present

## 2018-02-13 DIAGNOSIS — I081 Rheumatic disorders of both mitral and tricuspid valves: Secondary | ICD-10-CM | POA: Diagnosis not present

## 2018-02-13 DIAGNOSIS — E7800 Pure hypercholesterolemia, unspecified: Secondary | ICD-10-CM | POA: Diagnosis not present

## 2018-02-13 HISTORY — DX: Acute diastolic (congestive) heart failure: I50.31

## 2018-02-14 DIAGNOSIS — E7800 Pure hypercholesterolemia, unspecified: Secondary | ICD-10-CM | POA: Diagnosis not present

## 2018-02-14 DIAGNOSIS — I5031 Acute diastolic (congestive) heart failure: Secondary | ICD-10-CM | POA: Diagnosis not present

## 2018-02-14 DIAGNOSIS — I2510 Atherosclerotic heart disease of native coronary artery without angina pectoris: Secondary | ICD-10-CM | POA: Diagnosis not present

## 2018-02-14 DIAGNOSIS — I509 Heart failure, unspecified: Secondary | ICD-10-CM | POA: Diagnosis not present

## 2018-02-14 DIAGNOSIS — I110 Hypertensive heart disease with heart failure: Secondary | ICD-10-CM | POA: Diagnosis not present

## 2018-02-16 ENCOUNTER — Telehealth: Payer: Self-pay | Admitting: Interventional Cardiology

## 2018-02-16 NOTE — Telephone Encounter (Signed)
Spoke with pt and obtained permission to speak with wife.  Wife states pt was in hospital in [place] last week and they released them and told them they had to f/u today in the office.  Scheduled pt to come in tomorrow to see [employer], PA-C.  Pt was hospitalized for CHF.  BP at admission was 197/87, O2 84%, pt was very SOB.  Wife mentioned that MDs there think pt's stent maybe closed.  Wife to bring by records over today.  Wife appreciative for call.

## 2018-02-16 NOTE — Telephone Encounter (Signed)
New Message   Patient wife is calling in reference to her husband that was admitted to the hospital when they were out of town. She states that the cardiologist wanted him to be seen by Dr. Tamala Julian ASAP. The symptoms waxs to he was having shortness of breath but they released him because he was to see Dr. Tamala Julian. Please call to discuss.

## 2018-02-16 NOTE — Progress Notes (Signed)
Cardiology Office Note    Date:  02/17/2018   ID:  Calvin Ramirez, DOB Dec 17, 1940, MRN [id number]  PCP:  Shirline Frees, MD  Cardiologist:  Dr. Tamala Julian Chief Complaint: Hospital follow up   History of Present Illness:   Calvin Ramirez is a 77 y.o. male coronary artery disease,  hypertension, hyperlipidemia, and Parkinson's disease presents fo hospital follow up.   Low risk myoview 09/2016.   Has drug-eluting stent in the proximal LAD, distal RCA, and posterolateral branch of the RCA in 2011. She was doing well when last seen by Dr. Tamala Julian 08/2017.  Admitted 02/13/18 overnight @ [facility] (records available in care everywhere).  He was there to attend wedding. He had sudden onset PND leading to ER visit.  He was found hypoxic and treated with supplemental oxygen.  Echocardiogram showed ejection fraction of 55-60% with restrictive left ventricular filling due to severe diastolic dysfunction, severely dilated left atrium, moderate mitral regurgitation, mild to moderate tricuspid regurgitation and RVSP is 49 mmHg.  Patient was seen by cardiologist and started on IV Lasix with almost 4 L diuresis overnight.  His symptoms improved significantly and discharge next day.  He was started on Lasix 20 mg daily and low-dose lisinopril.  No beta-blocker given no carotid bradycardia in 50s.  He had mild flat elevation of troponin 0.07>> 0.09>> 0.08>> 0.08.  BNP was 414.  Recommended stress test plus or minus cardiac cath to assess ischemic cardiomyopathy.  His aspirin and statin continued.  He was also treated for hypokalemia. Normal D-dimer.  Today he told me that he had a severe palpitation and shortness of breath leading to ER presentation at outside hospital EKG during admission showed sinus rhythm with PACs.  He flew back home yesterday.  Tired and fatigued.  He was walking in grocery shopping mall without chest pain or shortness of breath.  Denies orthopnea, PND,  syncope, lower extremity edema or melena.  States weight has been stable since discharge.  Compliant with medication and low-sodium diet.   Past Medical History:  Diagnosis Date  . Arthritis   . Coronary artery disease   . Hyperlipidemia   . Stented coronary artery 2011   x3    Past Surgical History:  Procedure Laterality Date  . APPENDECTOMY    . CARDIAC CATHETERIZATION  2011   3 stents  . COLONOSCOPY    . TONSILLECTOMY      Current Medications: Prior to Admission medications   Medication Sig Start Date End Date Taking? Authorizing Provider  Acetaminophen (TYLENOL PO) Take 1-2 tablets by mouth daily as needed (back pain).    [provider]  aspirin 81 MG tablet Take 81 mg by mouth daily.    [provider]  Cyanocobalamin (VITAMIN B-12 PO) Take 1 tablet by mouth every other day.    [provider]  Multiple Vitamin (MULTIVITAMIN) tablet Take 1 tablet by mouth every other day.     [provider]  rosuvastatin (CRESTOR) 40 MG tablet Take 1 tablet (40 mg total) by mouth daily. 09/12/17 09/07/18  Belva Crome, MD    Allergies:   Levaquin [levofloxacin hemihydrate] and Penicillins   Social History   Socioeconomic History  . Marital status: Married    Spouse name: Not on file  . Number of children: Not on file  . Years of education: Not on file  . Highest education level: Not on file  Occupational History  . Not on file  Social Needs  .  Financial resource strain: Not on file  . Food insecurity:    Worry: Not on file    Inability: Not on file  . Transportation needs:    Medical: Not on file    Non-medical: Not on file  Tobacco Use  . Smoking status: Never Smoker  . Smokeless tobacco: Never Used  Substance and Sexual Activity  . Alcohol use: Yes    Alcohol/week: 0.0 oz    Comment: occ  . Drug use: No  . Sexual activity: Not on file  Lifestyle  . Physical activity:    Days per week: Not on file    Minutes per session: Not  on file  . Stress: Not on file  Relationships  . Social connections:    Talks on phone: Not on file    Gets together: Not on file    Attends religious service: Not on file    Active member of club or organization: Not on file    Attends meetings of clubs or organizations: Not on file    Relationship status: Not on file  Other Topics Concern  . Not on file  Social History Narrative  . Not on file     Family History:  The patient's family history includes Cancer in his father; Heart attack in his mother.   ROS:   Please see the history of present illness.    ROS All other systems reviewed and are negative.   PHYSICAL EXAM:   VS:  BP (!) 146/62   Pulse (!) 59   Ht 5\' 10"  (1.778 m)   Wt 217 lb 3.2 oz (98.5 kg)   SpO2 97%   BMI 31.16 kg/m    GEN: Well nourished, well developed, in no acute distress  HEENT: normal  Neck: no JVD, carotid bruits, or masses Cardiac: RRR; systolic murmurs, rubs, or gallops, no edema  Respiratory:  clear to auscultation bilaterally, normal work of breathing GI: soft, nontender, nondistended, + BS MS: no deformity or atrophy  Skin: warm and dry, no rash Neuro:  Alert and Oriented x 3, Strength and sensation are intact Psych: euthymic mood, full affect  Wt Readings from Last 3 Encounters:  02/17/18 217 lb 3.2 oz (98.5 kg)  09/11/17 216 lb 12.8 oz (98.3 kg)  10/10/16 217 lb (98.4 kg)      Studies/Labs Reviewed:   EKG:  EKG is not ordered today.    Recent Labs: 11/21/2017: ALT 18   Lipid Panel    Component Value Date/Time   CHOL 160 11/21/2017 0912   TRIG 208 (H) 11/21/2017 0912   HDL 43 11/21/2017 0912   CHOLHDL 3.7 11/21/2017 0912   CHOLHDL 6.2 (H) 09/06/2016 0952   VLDL 57 (H) 09/06/2016 0952   LDLCALC 75 11/21/2017 0912   LDLDIRECT 98.0 04/21/2015 0858    Additional studies/ records that were reviewed today include:   Myoview 09/2016  Nuclear stress EF: 50%.  Blood pressure demonstrated a normal response to  exercise.  There was no ST segment deviation noted during stress.  This is a low risk study.  The left ventricular ejection fraction is mildly decreased (45-54%).  The study is normal.  Probable normal perfusion and soft tissue attenuation.  Echo 02/13/18 @ [place] (Male/Male) Normal Values   2D ECHO   LV Diastolic Diameter PLAX4.8 cm4.2 - 5.9 / 3.9 - 5.3  cm   LV Systolic Diameter PLAX 3.7 cm   LV Fractional Shortening PLAX 41.2 %   IVS Diastolic  Thickness 1.8 cm0.6 - 1.0 / 0.6 - 0.9  cm   IVS Systolic RWERXVQMG8.6 cm   LVPW Diastolic PYPPJKDTO6.7 TI4.5 - 1.0 / 0.6 - 0.9  cm   LVPW Systolic Thickness 1.7 cm   LV Relative Wall Thickness0.7  IVS to PW Ratio 1.1  RV Internal Dim ED PLAX 3.0 cm   LVOT Diameter 2.1 cm   Aortic Root YKDXIPJA2.5 cm   LA Systolic Diameter LX 5.2 KN3.9 - 4.0 / 2.7 - 3.8  cm   LV Ejection Fraction MOD BP 50.3 %>= 55%   LV Cardiac Index MOD BP 2284.5 cm3/minm2  LV Ejection Fraction MOD 4C 52.7 %   LV Cardiac Index MOD 4C 2438.7 JQ7/HALP3  LV Diastolic Length 7T02.4 cm  LV Ejection Fraction 4C AL53.9 %   LV Cardiac Index 4C AL2592.5 cm3/minm2  LV Ejection Fraction MOD 2C 48.4 %   LV Cardiac Index MOD 2C 2156.0 cm3/minm2  LV Ejection Fraction 2C AL50.4 %   LV Cardiac Index 2C AL2344.2 cm3/minm2  LV Ejection Fraction 2D Teich 45.9  %   LA Volume Index 33.1 cm3/m2 16 - 28 cm3/m2   Ascending Aorta Diameter3.8 cm    DOPPLER   AV Peak Velocity160.0 cm/s   AV Peak Gradient10.2 mmHg  AV Mean Gradient5.0 mmHg   AV Velocity Time Integral 26.9 cm  AI Peak Velocity413.0 cm/s   AI Peak Gradient68.2 mmHg  AI Pressure Half Time 1082.0 ms  LVOT Peak Velocity90.8 cm/s  LVOT Peak Gradient3.3 mmHg   LVOT Velocity Time Integral 16.7 cm  LVOT Cardiac Index1651.8 cm3/minm2  AV Area Cont Eq vti 2.2 cm2  AV Area Cont Eq vti Index 0.9 cm2/m2   AV Area Cont Eq pk2.0 cm2  MR Peak Velocity542.0 cm/s   MR Peak Gradient117.5 mmHg   MR ERO PISA 0.2 cm2  MR Regurgitant Volume PISA34.7 cm3   Mitral E Point Velocity 89.2 cm/s  Mitral A Point Velocity 31.2 cm/s  Mitral E to A Ratio 2.9  MV Deceleration Time148.0 ms   LV E' Lateral Velocity5.2 cm/s   Mitral E to LV E' Lateral Ratio 17.1   LV A' Lateral Velocity4.6 cm/s   LV E' Septal Velocity 5.1 cm/s   Mitral E to LV E' Septal Ratio17.5   LV A' Septal Velocity 6.2 cm/s   E/e' Ratio17.3   TR Peak Velocity313.0  cm/s   TR Peak Gradient39.2 mmHg  Right Atrial Pressure 10.0 mmHg  Pulmonary Artery Systolic OXBDZH29.9 mmHg  Right Ventricular Systolic MEQAS34.1 mmHg  PV Peak Velocity100.0 cm/s   PV Peak Gradient4.0 mmHg      Procedure Components    Transthoracic Echocardiogram (TTE) was performed utilizing 2 D, M-mode,  spectral Doppler and color flow Doppler. Multiple views were obtained to  evaluate cardiac structure, function along with information regarding  intracardiac blood flow and hemodynamics.Marland Kitchen    FINDINGS    Left Ventricle   Low normal LV systolic function. Ejection fraction is 50 - 55%.Restrictive    left ventricular filling indicating severe diastolic dysfunction.Tissue  Doppler abnormal. Severely increased septal wall thickness. Moderately  increased posterior wall thickness. Normal left ventricular diastolic  diameter.  Mildly increased left ventricular diastolic volume. Moderately increased left   ventricular systolic volume. Images demonstrated normal wall motion.    Right Ventricle   Normal right ventricular systolic function. Normal sized right ventricle.    Left Atrium   Severely increased left atrial diameter. Mildly increased left atrial volume.   Mildly increased left atrial area.  Right Atrium   Normal right atrial size.    Mitral Valve   Thickened mitral valve leaflets. No mitral stenosis. Moderate mitral  regurgitation.    Aortic Valve   Thickened, sclerotic appearing aortic valve. No aortic stenosis. No aortic  regurgitation.    Tricuspid Valve   RVSP is 49 mmHg. Thickened tricuspid valve leaflets. No tricuspid stenosis.  Mild to moderate tricuspid regurgitation.    Pulmonic Valve   Structurally normal pulmonic valve leaflets. No pulmonic stenosis. No   significant pulmonic regurgitation.    Pericardium   No pericardial effusion.    Aorta   The aortic root is normal sized.    Great Vessels   Normal two-dimensional, color flow and Doppler interrogation of the inferior    vena cava. Greater than 50% respiratory change in dimension of the inferior  vena cava normal.    CONCLUSIONS   Low normal LV systolic function. Ejection fraction is 50 - 55%.Restrictive    left ventricular filling indicating severe diastolic dysfunction.Tissue  Doppler abnormal. Severely increased septal wall thickness. Moderately  increased posterior wall thickness. Normal left ventricular diastolic  diameter.  Mildly increased left ventricular diastolic volume. Moderately increased left   ventricular systolic volume. Images demonstrated normal wall motion.   Mildly increased left atrial area.   Moderate mitral regurgitation.   Thickened, sclerotic appearing aortic valve. No aortic stenosis. No aortic  regurgitation.  Mild to moderate tricuspid regurgitation.   RVSP is 49 mmHg.  Harden Mo MD   (Electronically Signed)   Final Date:13 February 2018  ASSESSMENT & PLAN:    1. Chronic diastolic CHF - Euvolemic. His orthopnea has resolved. Check BMET and BNP today. Continue current dose of lasix and lisinopril.   2. Elevated troponin - Flat tread noted during admission as above. He is still fatigue and tired. This is new change from his baseline. There were concern regarding ischemic cardiomyopathy.Echo with LVEF of 50-55%.  I have reviewed with Dr. Tamala Julian. Will do L & R cath.  The patient understands that risks include but are not limited to stroke (1 in 1000), death (1 in 64), kidney failure [usually temporary] (1 in 500), bleeding (1 in 200), allergic reaction [possibly serious] (1 in 200), and agrees to proceed. - He will come back for CBC and PT/INR.  3. HTN - Stable on current medications.  4. HLD - Continue statin.     Medication Adjustments/Labs and Tests Ordered: Current medicines are reviewed at length with the patient today.  Concerns regarding medicines are outlined above.  Medication changes, Labs and Tests ordered today are listed in the Patient Instructions below. Patient Instructions  Medication Instructions:  The current medical regimen is effective;  continue present plan and medications.  Labwork: Please have blood work today (BMP, Pro BNP)  Follow-Up: Follow up with Dr Tamala Julian at his next available appointment.  If you need a refill on your cardiac medications before your next appointment, please call your pharmacy.  Thank you for choosing [facility]!!        Jarrett Soho, [place]  02/17/2018 10:47 AM    [address], [place], [place]  [address] Phone: [phone]; Fax: [phone]

## 2018-02-16 NOTE — H&P (View-Only) (Signed)
Cardiology Office Note    Date:  02/17/2018   ID:  Calvin Ramirez, DOB [birth year], MRN [id number]  PCP:  Calvin Frees, MD  Cardiologist:  Dr. Tamala Julian Chief Complaint: Hospital follow up   History of Present Illness:   Calvin Ramirez is a 77 y.o. male coronary artery disease,  hypertension, hyperlipidemia, and Parkinson's disease presents fo hospital follow up.   Low risk myoview 09/2016.   Has drug-eluting stent in the proximal LAD, distal RCA, and posterolateral branch of the RCA in 2011. She was doing well when last seen by Dr. Tamala Julian 08/2017.  Admitted 02/13/18 overnight @ [facility] (records available in care everywhere).  He was there to attend wedding. He had sudden onset PND leading to ER visit.  He was found hypoxic and treated with supplemental oxygen.  Echocardiogram showed ejection fraction of 55-60% with restrictive left ventricular filling due to severe diastolic dysfunction, severely dilated left atrium, moderate mitral regurgitation, mild to moderate tricuspid regurgitation and RVSP is 49 mmHg.  Patient was seen by cardiologist and started on IV Lasix with almost 4 L diuresis overnight.  His symptoms improved significantly and discharge next day.  He was started on Lasix 20 mg daily and low-dose lisinopril.  No beta-blocker given no carotid bradycardia in 50s.  He had mild flat elevation of troponin 0.07>> 0.09>> 0.08>> 0.08.  BNP was 414.  Recommended stress test plus or minus cardiac cath to assess ischemic cardiomyopathy.  His aspirin and statin continued.  He was also treated for hypokalemia. Normal D-dimer.  Today he told me that he had a severe palpitation and shortness of breath leading to ER presentation at outside hospital EKG during admission showed sinus rhythm with PACs.  He flew back home yesterday.  Tired and fatigued.  He was walking in grocery shopping mall without chest pain or shortness of breath.  Denies orthopnea, PND,  syncope, lower extremity edema or melena.  States weight has been stable since discharge.  Compliant with medication and low-sodium diet.   Past Medical History:  Diagnosis Date  . Arthritis   . Coronary artery disease   . Hyperlipidemia   . Stented coronary artery 2011   x3    Past Surgical History:  Procedure Laterality Date  . APPENDECTOMY    . CARDIAC CATHETERIZATION  2011   3 stents  . COLONOSCOPY    . TONSILLECTOMY      Current Medications: Prior to Admission medications   Medication Sig Start Date End Date Taking? Authorizing Provider  Acetaminophen (TYLENOL PO) Take 1-2 tablets by mouth daily as needed (back pain).    [provider]  aspirin 81 MG tablet Take 81 mg by mouth daily.    [provider]  Cyanocobalamin (VITAMIN B-12 PO) Take 1 tablet by mouth every other day.    [provider]  Multiple Vitamin (MULTIVITAMIN) tablet Take 1 tablet by mouth every other day.     [provider]  rosuvastatin (CRESTOR) 40 MG tablet Take 1 tablet (40 mg total) by mouth daily. 09/12/17 09/07/18  Belva Crome, MD    Allergies:   Levaquin [levofloxacin hemihydrate] and Penicillins   Social History   Socioeconomic History  . Marital status: Married    Spouse name: Not on file  . Number of children: Not on file  . Years of education: Not on file  . Highest education level: Not on file  Occupational History  . Not on file  Social Needs  .  Financial resource strain: Not on file  . Food insecurity:    Worry: Not on file    Inability: Not on file  . Transportation needs:    Medical: Not on file    Non-medical: Not on file  Tobacco Use  . Smoking status: Never Smoker  . Smokeless tobacco: Never Used  Substance and Sexual Activity  . Alcohol use: Yes    Alcohol/week: 0.0 oz    Comment: occ  . Drug use: No  . Sexual activity: Not on file  Lifestyle  . Physical activity:    Days per week: Not on file    Minutes per session: Not  on file  . Stress: Not on file  Relationships  . Social connections:    Talks on phone: Not on file    Gets together: Not on file    Attends religious service: Not on file    Active member of club or organization: Not on file    Attends meetings of clubs or organizations: Not on file    Relationship status: Not on file  Other Topics Concern  . Not on file  Social History Narrative  . Not on file     Family History:  The patient's family history includes Cancer in his father; Heart attack in his mother.   ROS:   Please see the history of present illness.    ROS All other systems reviewed and are negative.   PHYSICAL EXAM:   VS:  BP (!) 146/62   Pulse (!) 59   Ht 5\' 10"  (1.778 m)   Wt 217 lb 3.2 oz (98.5 kg)   SpO2 97%   BMI 31.16 kg/m    GEN: Well nourished, well developed, in no acute distress  HEENT: normal  Neck: no JVD, carotid bruits, or masses Cardiac: RRR; systolic murmurs, rubs, or gallops, no edema  Respiratory:  clear to auscultation bilaterally, normal work of breathing GI: soft, nontender, nondistended, + BS MS: no deformity or atrophy  Skin: warm and dry, no rash Neuro:  Alert and Oriented x 3, Strength and sensation are intact Psych: euthymic mood, full affect  Wt Readings from Last 3 Encounters:  02/17/18 217 lb 3.2 oz (98.5 kg)  09/11/17 216 lb 12.8 oz (98.3 kg)  10/10/16 217 lb (98.4 kg)      Studies/Labs Reviewed:   EKG:  EKG is not ordered today.    Recent Labs: 11/21/2017: ALT 18   Lipid Panel    Component Value Date/Time   CHOL 160 11/21/2017 0912   TRIG 208 (H) 11/21/2017 0912   HDL 43 11/21/2017 0912   CHOLHDL 3.7 11/21/2017 0912   CHOLHDL 6.2 (H) 09/06/2016 0952   VLDL 57 (H) 09/06/2016 0952   LDLCALC 75 11/21/2017 0912   LDLDIRECT 98.0 04/21/2015 0858    Additional studies/ records that were reviewed today include:   Myoview 09/2016  Nuclear stress EF: 50%.  Blood pressure demonstrated a normal response to  exercise.  There was no ST segment deviation noted during stress.  This is a low risk study.  The left ventricular ejection fraction is mildly decreased (45-54%).  The study is normal.  Probable normal perfusion and soft tissue attenuation.  Echo 02/13/18 @ [facility] (Male/Male) Normal Values   2D ECHO   LV Diastolic Diameter PLAX4.8 cm4.2 - 5.9 / 3.9 - 5.3  cm   LV Systolic Diameter PLAX 3.7 cm   LV Fractional Shortening PLAX 20.2 %   IVS Diastolic  Thickness 1.8 cm0.6 - 1.0 / 0.6 - 0.9  cm   IVS Systolic RJJOACZYS0.6 cm   LVPW Diastolic TKZSWFUXN2.3 FT7.3 - 1.0 / 0.6 - 0.9  cm   LVPW Systolic Thickness 1.7 cm   LV Relative Wall Thickness0.7  IVS to PW Ratio 1.1  RV Internal Dim ED PLAX 3.0 cm   LVOT Diameter 2.1 cm   Aortic Root UKGURKYH0.6 cm   LA Systolic Diameter LX 5.2 CB7.6 - 4.0 / 2.7 - 3.8  cm   LV Ejection Fraction MOD BP 50.3 %>= 55%   LV Cardiac Index MOD BP 2284.5 cm3/minm2  LV Ejection Fraction MOD 4C 52.7 %   LV Cardiac Index MOD 4C 2438.7 EG3/TDVV6  LV Diastolic Length 1Y07.3 cm  LV Ejection Fraction 4C AL53.9 %   LV Cardiac Index 4C AL2592.5 cm3/minm2  LV Ejection Fraction MOD 2C 48.4 %   LV Cardiac Index MOD 2C 2156.0 cm3/minm2  LV Ejection Fraction 2C AL50.4 %   LV Cardiac Index 2C AL2344.2 cm3/minm2  LV Ejection Fraction 2D Teich 45.9  %   LA Volume Index 33.1 cm3/m2 16 - 28 cm3/m2   Ascending Aorta Diameter3.8 cm    DOPPLER   AV Peak Velocity160.0 cm/s   AV Peak Gradient10.2 mmHg  AV Mean Gradient5.0 mmHg   AV Velocity Time Integral 26.9 cm  AI Peak Velocity413.0 cm/s   AI Peak Gradient68.2 mmHg  AI Pressure Half Time 1082.0 ms  LVOT Peak Velocity90.8 cm/s  LVOT Peak Gradient3.3 mmHg   LVOT Velocity Time Integral 16.7 cm  LVOT Cardiac Index1651.8 cm3/minm2  AV Area Cont Eq vti 2.2 cm2  AV Area Cont Eq vti Index 0.9 cm2/m2   AV Area Cont Eq pk2.0 cm2  MR Peak Velocity542.0 cm/s   MR Peak Gradient117.5 mmHg   MR ERO PISA 0.2 cm2  MR Regurgitant Volume PISA34.7 cm3   Mitral E Point Velocity 89.2 cm/s  Mitral A Point Velocity 31.2 cm/s  Mitral E to A Ratio 2.9  MV Deceleration Time148.0 ms   LV E' Lateral Velocity5.2 cm/s   Mitral E to LV E' Lateral Ratio 17.1   LV A' Lateral Velocity4.6 cm/s   LV E' Septal Velocity 5.1 cm/s   Mitral E to LV E' Septal Ratio17.5   LV A' Septal Velocity 6.2 cm/s   E/e' Ratio17.3   TR Peak Velocity313.0  cm/s   TR Peak Gradient39.2 mmHg  Right Atrial Pressure 10.0 mmHg  Pulmonary Artery Systolic XTGGYI94.8 mmHg  Right Ventricular Systolic NIOEV03.5 mmHg  PV Peak Velocity100.0 cm/s   PV Peak Gradient4.0 mmHg      Procedure Components    Transthoracic Echocardiogram (TTE) was performed utilizing 2 D, M-mode,  spectral Doppler and color flow Doppler. Multiple views were obtained to  evaluate cardiac structure, function along with information regarding  intracardiac blood flow and hemodynamics.Marland Kitchen    FINDINGS    Left Ventricle   Low normal LV systolic function. Ejection fraction is 50 - 55%.Restrictive    left ventricular filling indicating severe diastolic dysfunction.Tissue  Doppler abnormal. Severely increased septal wall thickness. Moderately  increased posterior wall thickness. Normal left ventricular diastolic  diameter.  Mildly increased left ventricular diastolic volume. Moderately increased left   ventricular systolic volume. Images demonstrated normal wall motion.    Right Ventricle   Normal right ventricular systolic function. Normal sized right ventricle.    Left Atrium   Severely increased left atrial diameter. Mildly increased left atrial volume.   Mildly increased left atrial area.  Right Atrium   Normal right atrial size.    Mitral Valve   Thickened mitral valve leaflets. No mitral stenosis. Moderate mitral  regurgitation.    Aortic Valve   Thickened, sclerotic appearing aortic valve. No aortic stenosis. No aortic  regurgitation.    Tricuspid Valve   RVSP is 49 mmHg. Thickened tricuspid valve leaflets. No tricuspid stenosis.  Mild to moderate tricuspid regurgitation.    Pulmonic Valve   Structurally normal pulmonic valve leaflets. No pulmonic stenosis. No   significant pulmonic regurgitation.    Pericardium   No pericardial effusion.    Aorta   The aortic root is normal sized.    Great Vessels   Normal two-dimensional, color flow and Doppler interrogation of the inferior    vena cava. Greater than 50% respiratory change in dimension of the inferior  vena cava normal.    CONCLUSIONS   Low normal LV systolic function. Ejection fraction is 50 - 55%.Restrictive    left ventricular filling indicating severe diastolic dysfunction.Tissue  Doppler abnormal. Severely increased septal wall thickness. Moderately  increased posterior wall thickness. Normal left ventricular diastolic  diameter.  Mildly increased left ventricular diastolic volume. Moderately increased left   ventricular systolic volume. Images demonstrated normal wall motion.   Mildly increased left atrial area.   Moderate mitral regurgitation.   Thickened, sclerotic appearing aortic valve. No aortic stenosis. No aortic  regurgitation.  Mild to moderate tricuspid regurgitation.   RVSP is 49 mmHg.  Harden Mo MD   (Electronically Signed)   Final Date:13 February 2018  ASSESSMENT & PLAN:    1. Chronic diastolic CHF - Euvolemic. His orthopnea has resolved. Check BMET and BNP today. Continue current dose of lasix and lisinopril.   2. Elevated troponin - Flat tread noted during admission as above. He is still fatigue and tired. This is new change from his baseline. There were concern regarding ischemic cardiomyopathy.Echo with LVEF of 50-55%.  I have reviewed with Dr. Tamala Julian. Will do L & R cath.  The patient understands that risks include but are not limited to stroke (1 in 1000), death (1 in 20), kidney failure [usually temporary] (1 in 500), bleeding (1 in 200), allergic reaction [possibly serious] (1 in 200), and agrees to proceed. - He will come back for CBC and PT/INR.  3. HTN - Stable on current medications.  4. HLD - Continue statin.     Medication Adjustments/Labs and Tests Ordered: Current medicines are reviewed at length with the patient today.  Concerns regarding medicines are outlined above.  Medication changes, Labs and Tests ordered today are listed in the Patient Instructions below. Patient Instructions  Medication Instructions:  The current medical regimen is effective;  continue present plan and medications.  Labwork: Please have blood work today (BMP, Pro BNP)  Follow-Up: Follow up with Dr Tamala Julian at his next available appointment.  If you need a refill on your cardiac medications before your next appointment, please call your pharmacy.  Thank you for choosing [facility]!!        Calvin Ramirez, [place]  02/17/2018 10:47 AM    [address], [place], [place]  [address] Phone: [phone]; Fax: [phone]

## 2018-02-17 ENCOUNTER — Encounter: Payer: Self-pay | Admitting: *Deleted

## 2018-02-17 ENCOUNTER — Ambulatory Visit (INDEPENDENT_AMBULATORY_CARE_PROVIDER_SITE_OTHER): Payer: Medicare Other | Admitting: Physician Assistant

## 2018-02-17 ENCOUNTER — Telehealth: Payer: Self-pay | Admitting: *Deleted

## 2018-02-17 ENCOUNTER — Encounter: Payer: Self-pay | Admitting: Physician Assistant

## 2018-02-17 ENCOUNTER — Telehealth: Payer: Self-pay | Admitting: Interventional Cardiology

## 2018-02-17 VITALS — BP 146/62 | HR 59 | Ht 70.0 in | Wt 217.2 lb

## 2018-02-17 DIAGNOSIS — I10 Essential (primary) hypertension: Secondary | ICD-10-CM

## 2018-02-17 DIAGNOSIS — Z79899 Other long term (current) drug therapy: Secondary | ICD-10-CM

## 2018-02-17 DIAGNOSIS — I5032 Chronic diastolic (congestive) heart failure: Secondary | ICD-10-CM

## 2018-02-17 DIAGNOSIS — I2510 Atherosclerotic heart disease of native coronary artery without angina pectoris: Secondary | ICD-10-CM | POA: Diagnosis not present

## 2018-02-17 DIAGNOSIS — R0602 Shortness of breath: Secondary | ICD-10-CM | POA: Diagnosis not present

## 2018-02-17 LAB — BASIC METABOLIC PANEL
BUN/Creatinine Ratio: 20 (ref 10–24)
BUN: 23 mg/dL (ref 8–27)
CO2: 26 mmol/L (ref 20–29)
Calcium: 9.5 mg/dL (ref 8.6–10.2)
Chloride: 99 mmol/L (ref 96–106)
Creatinine, Ser: 1.14 mg/dL (ref 0.76–1.27)
GFR calc Af Amer: 71 mL/min/{1.73_m2} (ref >59)
GFR calc non Af Amer: 62 mL/min/{1.73_m2} (ref >59)
Glucose: 96 mg/dL (ref 65–99)
Potassium: 4.2 mmol/L (ref 3.5–5.2)
Sodium: 142 mmol/L (ref 134–144)

## 2018-02-17 LAB — PRO B NATRIURETIC PEPTIDE: NT-Pro BNP: 548 pg/mL — ABNORMAL HIGH (ref 0–486)

## 2018-02-17 NOTE — Patient Instructions (Signed)
Medication Instructions:  The current medical regimen is effective;  continue present plan and medications.  Labwork: Please have blood work today (BMP, Pro BNP)  Follow-Up: Follow up with Dr Tamala Julian at his next available appointment.  If you need a refill on your cardiac medications before your next appointment, please call your pharmacy.  Thank you for choosing [facility]!!

## 2018-02-17 NOTE — Telephone Encounter (Signed)
Left message for pt to call back to discuss cath date,time and instructions.  He will need to come in for CBC and PT/INR.  He had BMP today. Cath scheduled with Dr Tamala Julian on 02/24/18 at 12N.  Pt to report at 10 am.  Letter completed and printed for patient to pick up when he comes in for lab.

## 2018-02-17 NOTE — Progress Notes (Signed)
Left message for patient to call back to review instructions and schedule lab work.

## 2018-02-17 NOTE — Telephone Encounter (Signed)
Calvin Ramirez is calling about some paperwork in which she left with you this morning , States that she needs a copy of them to see the next doctor. Please call

## 2018-02-17 NOTE — Telephone Encounter (Signed)
Spoke with wife and advised I would place these at front desk for pick up.

## 2018-02-18 ENCOUNTER — Other Ambulatory Visit: Payer: Medicare Other | Admitting: *Deleted

## 2018-02-18 ENCOUNTER — Telehealth: Payer: Self-pay | Admitting: *Deleted

## 2018-02-18 DIAGNOSIS — I2510 Atherosclerotic heart disease of native coronary artery without angina pectoris: Secondary | ICD-10-CM | POA: Diagnosis not present

## 2018-02-18 MED ORDER — FUROSEMIDE 20 MG PO TABS: ORAL_TABLET | ORAL | 0 refills | Status: DC

## 2018-02-18 NOTE — Telephone Encounter (Signed)
-----   Message from [place], [place] sent at 02/18/2018  9:47 AM EDT ----- Electrolytes and Scr normal. Fluid maker minimally elevated. Increase lasix to 40mg  qd x 2 days then back to 20mg  qd. Cath as schedule next week with Dr. Tamala Julian. Patient will need CBC and PT/INR. Please call on Spouse mobile if unable to reach the patient.

## 2018-02-18 NOTE — Telephone Encounter (Signed)
     02/18/18 12:04 PM  Note    ----- Message from Leanor Kail, [place] sent at 02/18/2018  9:47 AM EDT ----- Electrolytes and Scr normal. Fluid maker minimally elevated. Increase lasix to 40mg  qd x 2 days then back to 20mg  qd. Cath as schedule next week with Dr. Tamala Julian. Patient will need CBC and PT/INR. Please call on Spouse mobile if unable to reach the patient.           Anderson Malta spoke with and his wife.  I called and spoke with wife who reports they were just here and had labwork and received the instructions.  She had no further questions.

## 2018-02-19 DIAGNOSIS — R251 Tremor, unspecified: Secondary | ICD-10-CM | POA: Diagnosis not present

## 2018-02-19 DIAGNOSIS — I5189 Other ill-defined heart diseases: Secondary | ICD-10-CM | POA: Diagnosis not present

## 2018-02-19 DIAGNOSIS — M545 Low back pain: Secondary | ICD-10-CM | POA: Diagnosis not present

## 2018-02-19 DIAGNOSIS — I10 Essential (primary) hypertension: Secondary | ICD-10-CM | POA: Diagnosis not present

## 2018-02-19 DIAGNOSIS — E7800 Pure hypercholesterolemia, unspecified: Secondary | ICD-10-CM | POA: Diagnosis not present

## 2018-02-19 DIAGNOSIS — I2510 Atherosclerotic heart disease of native coronary artery without angina pectoris: Secondary | ICD-10-CM | POA: Diagnosis not present

## 2018-02-19 LAB — COMPREHENSIVE METABOLIC PANEL
ALT: 21 IU/L (ref 0–44)
AST: 21 IU/L (ref 0–40)
Albumin/Globulin Ratio: 1.9 (ref 1.2–2.2)
Albumin: 4.8 g/dL (ref 3.5–4.8)
Alkaline Phosphatase: 64 IU/L (ref 39–117)
BUN/Creatinine Ratio: 21 (ref 10–24)
BUN: 25 mg/dL (ref 8–27)
Bilirubin Total: 0.6 mg/dL (ref 0.0–1.2)
CO2: 26 mmol/L (ref 20–29)
Calcium: 9.6 mg/dL (ref 8.6–10.2)
Chloride: 101 mmol/L (ref 96–106)
Creatinine, Ser: 1.19 mg/dL (ref 0.76–1.27)
GFR calc Af Amer: 68 mL/min/{1.73_m2} (ref >59)
GFR calc non Af Amer: 59 mL/min/{1.73_m2} — ABNORMAL LOW (ref >59)
Globulin, Total: 2.5 g/dL (ref 1.5–4.5)
Glucose: 98 mg/dL (ref 65–99)
Potassium: 4.5 mmol/L (ref 3.5–5.2)
Sodium: 142 mmol/L (ref 134–144)
Total Protein: 7.3 g/dL (ref 6.0–8.5)

## 2018-02-19 LAB — PROTIME-INR
INR: 1.1 (ref 0.8–1.2)
Prothrombin Time: 11 s (ref 9.1–12.0)

## 2018-02-20 ENCOUNTER — Telehealth: Payer: Self-pay | Admitting: Physician Assistant

## 2018-02-20 NOTE — Telephone Encounter (Signed)
-----   Message from [place], [place] sent at 02/19/2018 10:05 AM EDT ----- Labs stable for cath.

## 2018-02-20 NOTE — Telephone Encounter (Signed)
Returned pts wife call, DPR on file, and discussed lab results. See result note.

## 2018-02-20 NOTE — Telephone Encounter (Signed)
New message     Patient wife returning call for lab results

## 2018-02-20 NOTE — Progress Notes (Signed)
Pts wife, Gay Filler, [place] on file, has been made aware of normal result and verbalized understanding.  jw

## 2018-02-23 ENCOUNTER — Telehealth: Payer: Self-pay | Admitting: *Deleted

## 2018-02-23 NOTE — Telephone Encounter (Signed)
Pt contacted pre-catheterization scheduled at [facility] for: Tuesday February 24, 2018 12 noon Verified arrival time and place: [place] at: 10 AM Nothing to eat or drink after midnight prior to cath. Verified allergies in Epic. Verified no diabetes medications  Hold: Furosemide AM of cath  Except hold medications AM meds can be  taken pre-cath with sip of water including: ASA 81 mg  Confirmed patient has responsible person to drive home post procedure and observe patient for 24 hours: yes

## 2018-02-24 ENCOUNTER — Inpatient Hospital Stay (HOSPITAL_COMMUNITY): Payer: Medicare Other

## 2018-02-24 ENCOUNTER — Inpatient Hospital Stay (HOSPITAL_COMMUNITY)
Admission: RE | Admit: 2018-02-24 | Discharge: 2018-03-04 | DRG: 233 | Disposition: A | Discharge status: Home Health | Payer: Medicare Other | Source: Ambulatory Visit | Attending: Thoracic Surgery (Cardiothoracic Vascular Surgery) | Admitting: Thoracic Surgery (Cardiothoracic Vascular Surgery)

## 2018-02-24 ENCOUNTER — Other Ambulatory Visit: Payer: Self-pay

## 2018-02-24 ENCOUNTER — Encounter (HOSPITAL_COMMUNITY): Payer: Self-pay | Admitting: Interventional Cardiology

## 2018-02-24 ENCOUNTER — Encounter (HOSPITAL_COMMUNITY)
Admission: RE | Disposition: A | Discharge status: Home Health | Payer: Self-pay | Source: Ambulatory Visit | Attending: Thoracic Surgery (Cardiothoracic Vascular Surgery)

## 2018-02-24 ENCOUNTER — Other Ambulatory Visit: Payer: Self-pay | Admitting: *Deleted

## 2018-02-24 DIAGNOSIS — J181 Lobar pneumonia, unspecified organism: Secondary | ICD-10-CM | POA: Diagnosis not present

## 2018-02-24 DIAGNOSIS — M4690 Unspecified inflammatory spondylopathy, site unspecified: Secondary | ICD-10-CM | POA: Diagnosis present

## 2018-02-24 DIAGNOSIS — E669 Obesity, unspecified: Secondary | ICD-10-CM | POA: Diagnosis present

## 2018-02-24 DIAGNOSIS — Z8249 Family history of ischemic heart disease and other diseases of the circulatory system: Secondary | ICD-10-CM

## 2018-02-24 DIAGNOSIS — I509 Heart failure, unspecified: Secondary | ICD-10-CM

## 2018-02-24 DIAGNOSIS — F0390 Unspecified dementia without behavioral disturbance: Secondary | ICD-10-CM | POA: Diagnosis present

## 2018-02-24 DIAGNOSIS — I2510 Atherosclerotic heart disease of native coronary artery without angina pectoris: Secondary | ICD-10-CM | POA: Diagnosis not present

## 2018-02-24 DIAGNOSIS — E871 Hypo-osmolality and hyponatremia: Secondary | ICD-10-CM | POA: Diagnosis not present

## 2018-02-24 DIAGNOSIS — I081 Rheumatic disorders of both mitral and tricuspid valves: Secondary | ICD-10-CM | POA: Diagnosis present

## 2018-02-24 DIAGNOSIS — G8929 Other chronic pain: Secondary | ICD-10-CM | POA: Diagnosis present

## 2018-02-24 DIAGNOSIS — I5031 Acute diastolic (congestive) heart failure: Secondary | ICD-10-CM | POA: Diagnosis not present

## 2018-02-24 DIAGNOSIS — I10 Essential (primary) hypertension: Secondary | ICD-10-CM | POA: Diagnosis present

## 2018-02-24 DIAGNOSIS — I723 Aneurysm of iliac artery: Secondary | ICD-10-CM | POA: Diagnosis present

## 2018-02-24 DIAGNOSIS — K5900 Constipation, unspecified: Secondary | ICD-10-CM | POA: Diagnosis not present

## 2018-02-24 DIAGNOSIS — E785 Hyperlipidemia, unspecified: Secondary | ICD-10-CM | POA: Diagnosis present

## 2018-02-24 DIAGNOSIS — I110 Hypertensive heart disease with heart failure: Secondary | ICD-10-CM | POA: Diagnosis present

## 2018-02-24 DIAGNOSIS — I25110 Atherosclerotic heart disease of native coronary artery with unstable angina pectoris: Secondary | ICD-10-CM | POA: Diagnosis present

## 2018-02-24 DIAGNOSIS — I5033 Acute on chronic diastolic (congestive) heart failure: Secondary | ICD-10-CM | POA: Diagnosis present

## 2018-02-24 DIAGNOSIS — Z880 Allergy status to penicillin: Secondary | ICD-10-CM

## 2018-02-24 DIAGNOSIS — G20 Parkinson's disease: Secondary | ICD-10-CM | POA: Diagnosis present

## 2018-02-24 DIAGNOSIS — Z01810 Encounter for preprocedural cardiovascular examination: Secondary | ICD-10-CM

## 2018-02-24 DIAGNOSIS — Z951 Presence of aortocoronary bypass graft: Secondary | ICD-10-CM | POA: Diagnosis not present

## 2018-02-24 DIAGNOSIS — I4891 Unspecified atrial fibrillation: Secondary | ICD-10-CM | POA: Diagnosis not present

## 2018-02-24 DIAGNOSIS — J9811 Atelectasis: Secondary | ICD-10-CM | POA: Diagnosis not present

## 2018-02-24 DIAGNOSIS — D62 Acute posthemorrhagic anemia: Secondary | ICD-10-CM | POA: Diagnosis not present

## 2018-02-24 DIAGNOSIS — Z48812 Encounter for surgical aftercare following surgery on the circulatory system: Secondary | ICD-10-CM | POA: Diagnosis not present

## 2018-02-24 DIAGNOSIS — Q250 Patent ductus arteriosus: Secondary | ICD-10-CM

## 2018-02-24 DIAGNOSIS — I083 Combined rheumatic disorders of mitral, aortic and tricuspid valves: Secondary | ICD-10-CM | POA: Diagnosis not present

## 2018-02-24 DIAGNOSIS — Z7982 Long term (current) use of aspirin: Secondary | ICD-10-CM

## 2018-02-24 DIAGNOSIS — Z6829 Body mass index (BMI) 29.0-29.9, adult: Secondary | ICD-10-CM

## 2018-02-24 DIAGNOSIS — W19XXXA Unspecified fall, initial encounter: Secondary | ICD-10-CM | POA: Diagnosis not present

## 2018-02-24 DIAGNOSIS — Z09 Encounter for follow-up examination after completed treatment for conditions other than malignant neoplasm: Secondary | ICD-10-CM

## 2018-02-24 DIAGNOSIS — J90 Pleural effusion, not elsewhere classified: Secondary | ICD-10-CM | POA: Diagnosis not present

## 2018-02-24 DIAGNOSIS — Z881 Allergy status to other antibiotic agents status: Secondary | ICD-10-CM

## 2018-02-24 DIAGNOSIS — Y92239 Unspecified place in hospital as the place of occurrence of the external cause: Secondary | ICD-10-CM | POA: Diagnosis not present

## 2018-02-24 DIAGNOSIS — F0280 Dementia in other diseases classified elsewhere without behavioral disturbance: Secondary | ICD-10-CM | POA: Diagnosis not present

## 2018-02-24 DIAGNOSIS — T82855A Stenosis of coronary artery stent, initial encounter: Secondary | ICD-10-CM | POA: Diagnosis present

## 2018-02-24 DIAGNOSIS — Z79899 Other long term (current) drug therapy: Secondary | ICD-10-CM | POA: Diagnosis not present

## 2018-02-24 DIAGNOSIS — Z955 Presence of coronary angioplasty implant and graft: Secondary | ICD-10-CM

## 2018-02-24 DIAGNOSIS — F05 Delirium due to known physiological condition: Secondary | ICD-10-CM | POA: Diagnosis not present

## 2018-02-24 DIAGNOSIS — Z9489 Other transplanted organ and tissue status: Secondary | ICD-10-CM | POA: Diagnosis not present

## 2018-02-24 DIAGNOSIS — M160 Bilateral primary osteoarthritis of hip: Secondary | ICD-10-CM | POA: Diagnosis not present

## 2018-02-24 DIAGNOSIS — I272 Pulmonary hypertension, unspecified: Secondary | ICD-10-CM | POA: Diagnosis present

## 2018-02-24 HISTORY — DX: Essential (primary) hypertension: I10

## 2018-02-24 HISTORY — DX: Presence of aortocoronary bypass graft: Z95.1

## 2018-02-24 HISTORY — DX: Atherosclerotic heart disease of native coronary artery without angina pectoris: I25.10

## 2018-02-24 HISTORY — DX: Parkinson's disease: G20

## 2018-02-24 HISTORY — PX: RIGHT/LEFT HEART CATH AND CORONARY ANGIOGRAPHY: CATH118266

## 2018-02-24 LAB — CBC WITH DIFFERENTIAL/PLATELET
Basophils Absolute: 0 10*3/uL (ref 0.0–0.1)
Basophils Relative: 1 %
Eosinophils Absolute: 0.2 10*3/uL (ref 0.0–0.7)
Eosinophils Relative: 3 %
HCT: 42.5 % (ref 39.0–52.0)
Hemoglobin: 14.5 g/dL (ref 13.0–17.0)
Lymphocytes Relative: 22 %
Lymphs Abs: 1.3 10*3/uL (ref 0.7–4.0)
MCH: 30.9 pg (ref 26.0–34.0)
MCHC: 34.1 g/dL (ref 30.0–36.0)
MCV: 90.4 fL (ref 78.0–100.0)
Monocytes Absolute: 0.4 10*3/uL (ref 0.1–1.0)
Monocytes Relative: 8 %
Neutro Abs: 3.9 10*3/uL (ref 1.7–7.7)
Neutrophils Relative %: 66 %
Platelets: 223 10*3/uL (ref 150–400)
RBC: 4.7 MIL/uL (ref 4.22–5.81)
RDW: 13.4 % (ref 11.5–15.5)
WBC: 5.9 10*3/uL (ref 4.0–10.5)

## 2018-02-24 LAB — URINALYSIS, ROUTINE W REFLEX MICROSCOPIC
Bilirubin Urine: NEGATIVE
Glucose, UA: NEGATIVE mg/dL
Hgb urine dipstick: NEGATIVE
Ketones, ur: NEGATIVE mg/dL
Leukocytes, UA: NEGATIVE
Nitrite: NEGATIVE
Protein, ur: NEGATIVE mg/dL
Specific Gravity, Urine: 1.033 — ABNORMAL HIGH (ref 1.005–1.030)
pH: 5 (ref 5.0–8.0)

## 2018-02-24 LAB — PULMONARY FUNCTION TEST
FEF 25-75 Post: 3.9 L/sec
FEF 25-75 Pre: 2.67 L/sec
FEF2575-%Change-Post: 46 %
FEF2575-%Pred-Post: 185 %
FEF2575-%Pred-Pre: 126 %
FEV1-%Change-Post: 9 %
FEV1-%Pred-Post: 80 %
FEV1-%Pred-Pre: 72 %
FEV1-Post: 2.38 L
FEV1-Pre: 2.16 L
FEV1FVC-%Change-Post: -3 %
FEV1FVC-%Pred-Pre: 120 %
FEV6-%Change-Post: 13 %
FEV6-%Pred-Post: 73 %
FEV6-%Pred-Pre: 64 %
FEV6-Post: 2.84 L
FEV6-Pre: 2.5 L
FEV6FVC-%Change-Post: 0 %
FEV6FVC-%Pred-Post: 106 %
FEV6FVC-%Pred-Pre: 106 %
FVC-%Change-Post: 14 %
FVC-%Pred-Post: 69 %
FVC-%Pred-Pre: 60 %
FVC-Post: 2.86 L
FVC-Pre: 2.5 L
Post FEV1/FVC ratio: 83 %
Post FEV6/FVC ratio: 100 %
Pre FEV1/FVC ratio: 86 %
Pre FEV6/FVC Ratio: 100 %

## 2018-02-24 LAB — COMPREHENSIVE METABOLIC PANEL
ALT: 20 U/L (ref 17–63)
AST: 24 U/L (ref 15–41)
Albumin: 3.7 g/dL (ref 3.5–5.0)
Alkaline Phosphatase: 51 U/L (ref 38–126)
Anion gap: 10 (ref 5–15)
BUN: 22 mg/dL — ABNORMAL HIGH (ref 6–20)
CO2: 24 mmol/L (ref 22–32)
Calcium: 8.9 mg/dL (ref 8.9–10.3)
Chloride: 106 mmol/L (ref 101–111)
Creatinine, Ser: 1.06 mg/dL (ref 0.61–1.24)
GFR calc Af Amer: >60 mL/min (ref >60)
GFR calc non Af Amer: >60 mL/min (ref >60)
Glucose, Bld: 125 mg/dL — ABNORMAL HIGH (ref 65–99)
Potassium: 3.5 mmol/L (ref 3.5–5.1)
Sodium: 140 mmol/L (ref 135–145)
Total Bilirubin: 1 mg/dL (ref 0.3–1.2)
Total Protein: 6.3 g/dL — ABNORMAL LOW (ref 6.5–8.1)

## 2018-02-24 LAB — POCT I-STAT 3, VENOUS BLOOD GAS (G3P V)
Bicarbonate: 26.4 mmol/L (ref 20.0–28.0)
O2 Saturation: 62 %
TCO2: 28 mmol/L (ref 22–32)
pCO2, Ven: 49.3 mmHg (ref 44.0–60.0)
pH, Ven: 7.337 (ref 7.250–7.430)
pO2, Ven: 35 mmHg (ref 32.0–45.0)

## 2018-02-24 LAB — BLOOD GAS, ARTERIAL
Acid-Base Excess: 0.9 mmol/L (ref 0.0–2.0)
Bicarbonate: 25.1 mmol/L (ref 20.0–28.0)
Drawn by: 27407
FIO2: 21
O2 Saturation: 96.1 %
Patient temperature: 98.6
pCO2 arterial: 41.1 mmHg (ref 32.0–48.0)
pH, Arterial: 7.402 (ref 7.350–7.450)
pO2, Arterial: 81.9 mmHg — ABNORMAL LOW (ref 83.0–108.0)

## 2018-02-24 LAB — POCT I-STAT 3, ART BLOOD GAS (G3+)
Bicarbonate: 25.7 mmol/L (ref 20.0–28.0)
O2 Saturation: 97 %
TCO2: 27 mmol/L (ref 22–32)
pCO2 arterial: 44.4 mmHg (ref 32.0–48.0)
pH, Arterial: 7.37 (ref 7.350–7.450)
pO2, Arterial: 91 mmHg (ref 83.0–108.0)

## 2018-02-24 LAB — HEMOGLOBIN A1C
Hgb A1c MFr Bld: 5.5 % (ref 4.8–5.6)
Mean Plasma Glucose: 111.15 mg/dL

## 2018-02-24 LAB — ABO/RH: ABO/RH(D): A POS

## 2018-02-24 LAB — TYPE AND SCREEN
ABO/RH(D): A POS
Antibody Screen: NEGATIVE

## 2018-02-24 SURGERY — RIGHT/LEFT HEART CATH AND CORONARY ANGIOGRAPHY
Anesthesia: LOCAL

## 2018-02-24 MED ORDER — CHLORHEXIDINE GLUCONATE 0.12 % MT SOLN
15.0000 mL | Freq: Once | OROMUCOSAL | Status: AC
  Administered 2018-02-25: 15 mL via OROMUCOSAL
  Filled 2018-02-24: qty 15

## 2018-02-24 MED ORDER — SODIUM CHLORIDE 0.9 % IV SOLN: INTRAVENOUS | Status: DC

## 2018-02-24 MED ORDER — SODIUM CHLORIDE 0.9% FLUSH: 3.0000 mL | Freq: Two times a day (BID) | INTRAVENOUS | Status: DC

## 2018-02-24 MED ORDER — FENTANYL CITRATE (PF) 100 MCG/2ML IJ SOLN
INTRAMUSCULAR | Status: DC | PRN
  Administered 2018-02-24: 50 ug via INTRAVENOUS

## 2018-02-24 MED ORDER — HEPARIN (PORCINE) IN NACL 2-0.9 UNIT/ML-% IJ SOLN
INTRAMUSCULAR | Status: AC
  Filled 2018-02-24: qty 500

## 2018-02-24 MED ORDER — SODIUM CHLORIDE 0.9 % IV SOLN: 250.0000 mL | INTRAVENOUS | Status: DC | PRN

## 2018-02-24 MED ORDER — SODIUM CHLORIDE 0.9 % WEIGHT BASED INFUSION
3.0000 mL/kg/h | INTRAVENOUS | Status: DC
  Administered 2018-02-24: 3 mL/kg/h via INTRAVENOUS

## 2018-02-24 MED ORDER — TEMAZEPAM 15 MG PO CAPS
15.0000 mg | ORAL_CAPSULE | Freq: Once | ORAL | Status: AC | PRN
  Administered 2018-02-24: 15 mg via ORAL
  Filled 2018-02-24: qty 1

## 2018-02-24 MED ORDER — MIDAZOLAM HCL 2 MG/2ML IJ SOLN
INTRAMUSCULAR | Status: AC
  Filled 2018-02-24: qty 2

## 2018-02-24 MED ORDER — LEVOFLOXACIN IN D5W 500 MG/100ML IV SOLN
500.0000 mg | INTRAVENOUS | Status: DC
  Filled 2018-02-24: qty 100

## 2018-02-24 MED ORDER — SODIUM CHLORIDE 0.9 % WEIGHT BASED INFUSION: 1.0000 mL/kg/h | INTRAVENOUS | Status: DC

## 2018-02-24 MED ORDER — VANCOMYCIN HCL 10 G IV SOLR
1250.0000 mg | INTRAVENOUS | Status: AC
  Administered 2018-02-25: 1250 mg via INTRAVENOUS
  Filled 2018-02-24: qty 1250

## 2018-02-24 MED ORDER — HEPARIN (PORCINE) IN NACL 100-0.45 UNIT/ML-% IJ SOLN
1100.0000 [IU]/h | INTRAMUSCULAR | Status: DC
  Administered 2018-02-24: 1100 [IU]/h via INTRAVENOUS
  Filled 2018-02-24: qty 250

## 2018-02-24 MED ORDER — MILRINONE LACTATE IN DEXTROSE 20-5 MG/100ML-% IV SOLN
0.1250 ug/kg/min | INTRAVENOUS | Status: DC
  Filled 2018-02-24: qty 100

## 2018-02-24 MED ORDER — NITROGLYCERIN IN D5W 200-5 MCG/ML-% IV SOLN
2.0000 ug/min | INTRAVENOUS | Status: DC
  Filled 2018-02-24: qty 250

## 2018-02-24 MED ORDER — FENTANYL CITRATE (PF) 100 MCG/2ML IJ SOLN
INTRAMUSCULAR | Status: AC
  Filled 2018-02-24: qty 2

## 2018-02-24 MED ORDER — ALBUTEROL SULFATE (2.5 MG/3ML) 0.083% IN NEBU
2.5000 mg | INHALATION_SOLUTION | Freq: Once | RESPIRATORY_TRACT | Status: AC
  Administered 2018-02-24: 2.5 mg via RESPIRATORY_TRACT

## 2018-02-24 MED ORDER — DEXTROSE 5 % IV SOLN
0.0000 ug/min | INTRAVENOUS | Status: DC
  Filled 2018-02-24: qty 4

## 2018-02-24 MED ORDER — SODIUM CHLORIDE 0.9 % IV SOLN
INTRAVENOUS | Status: AC
  Administered 2018-02-25: .9 [IU]/h via INTRAVENOUS
  Filled 2018-02-24: qty 1

## 2018-02-24 MED ORDER — SODIUM CHLORIDE 0.9 % IV SOLN
30.0000 ug/min | INTRAVENOUS | Status: DC
  Filled 2018-02-24: qty 2

## 2018-02-24 MED ORDER — SODIUM CHLORIDE 0.9% FLUSH: 3.0000 mL | INTRAVENOUS | Status: DC | PRN

## 2018-02-24 MED ORDER — PLASMA-LYTE 148 IV SOLN
INTRAVENOUS | Status: DC
  Filled 2018-02-24: qty 2.5

## 2018-02-24 MED ORDER — BISACODYL 5 MG PO TBEC
5.0000 mg | DELAYED_RELEASE_TABLET | Freq: Once | ORAL | Status: DC
  Filled 2018-02-24: qty 1

## 2018-02-24 MED ORDER — CHLORHEXIDINE GLUCONATE 4 % EX LIQD
60.0000 mL | Freq: Once | CUTANEOUS | Status: AC
  Administered 2018-02-25: 4 via TOPICAL

## 2018-02-24 MED ORDER — HEPARIN (PORCINE) IN NACL 2-0.9 UNIT/ML-% IJ SOLN
INTRAMUSCULAR | Status: AC | PRN
  Administered 2018-02-24 (×3): 500 mL via INTRA_ARTERIAL

## 2018-02-24 MED ORDER — SODIUM CHLORIDE 0.9% FLUSH
3.0000 mL | Freq: Two times a day (BID) | INTRAVENOUS | Status: DC
  Administered 2018-02-24: 3 mL via INTRAVENOUS

## 2018-02-24 MED ORDER — LIDOCAINE HCL (PF) 1 % IJ SOLN
INTRAMUSCULAR | Status: DC | PRN
  Administered 2018-02-24: 15 mL via INTRADERMAL

## 2018-02-24 MED ORDER — CHLORHEXIDINE GLUCONATE 4 % EX LIQD
60.0000 mL | Freq: Once | CUTANEOUS | Status: AC
  Administered 2018-02-24: 4 via TOPICAL
  Filled 2018-02-24: qty 60

## 2018-02-24 MED ORDER — ACETAMINOPHEN 325 MG PO TABS: 650.0000 mg | ORAL_TABLET | ORAL | Status: DC | PRN

## 2018-02-24 MED ORDER — SODIUM CHLORIDE 0.9 % IV SOLN
1.5000 mg/kg/h | INTRAVENOUS | Status: DC
  Filled 2018-02-24: qty 25

## 2018-02-24 MED ORDER — MIDAZOLAM HCL 2 MG/2ML IJ SOLN
INTRAMUSCULAR | Status: DC | PRN
  Administered 2018-02-24: 1 mg via INTRAVENOUS

## 2018-02-24 MED ORDER — KENNESTONE BLOOD CARDIOPLEGIA VIAL
13.0000 mL | Freq: Once | Status: DC
  Filled 2018-02-24: qty 1

## 2018-02-24 MED ORDER — ASPIRIN 81 MG PO CHEW: 81.0000 mg | CHEWABLE_TABLET | ORAL | Status: DC

## 2018-02-24 MED ORDER — ONDANSETRON HCL 4 MG/2ML IJ SOLN: 4.0000 mg | Freq: Four times a day (QID) | INTRAMUSCULAR | Status: DC | PRN

## 2018-02-24 MED ORDER — POTASSIUM CHLORIDE 2 MEQ/ML IV SOLN
80.0000 meq | INTRAVENOUS | Status: DC
  Filled 2018-02-24: qty 40

## 2018-02-24 MED ORDER — ASPIRIN EC 81 MG PO TBEC: 81.0000 mg | DELAYED_RELEASE_TABLET | Freq: Every day | ORAL | Status: DC

## 2018-02-24 MED ORDER — LISINOPRIL 5 MG PO TABS: 5.0000 mg | ORAL_TABLET | Freq: Every day | ORAL | Status: DC

## 2018-02-24 MED ORDER — TRANEXAMIC ACID (OHS) BOLUS VIA INFUSION
15.0000 mg/kg | INTRAVENOUS | Status: AC
  Administered 2018-02-25: 1387.5 mg via INTRAVENOUS
  Filled 2018-02-24: qty 1388

## 2018-02-24 MED ORDER — IOHEXOL 350 MG/ML SOLN
INTRAVENOUS | Status: DC | PRN
  Administered 2018-02-24: 80 mL via INTRA_ARTERIAL

## 2018-02-24 MED ORDER — MAGNESIUM SULFATE 50 % IJ SOLN
40.0000 meq | INTRAMUSCULAR | Status: DC
  Filled 2018-02-24: qty 9.85

## 2018-02-24 MED ORDER — TRANEXAMIC ACID (OHS) PUMP PRIME SOLUTION
2.0000 mg/kg | INTRAVENOUS | Status: DC
  Filled 2018-02-24: qty 1.85

## 2018-02-24 MED ORDER — DEXMEDETOMIDINE HCL IN NACL 400 MCG/100ML IV SOLN
0.1000 ug/kg/h | INTRAVENOUS | Status: AC
  Administered 2018-02-25: .2 ug/kg/h via INTRAVENOUS
  Filled 2018-02-24: qty 100

## 2018-02-24 MED ORDER — KENNESTONE BLOOD CARDIOPLEGIA (KBC) MANNITOL SYRINGE (20%, 32ML)
32.0000 mL | Freq: Once | INTRAVENOUS | Status: DC
  Filled 2018-02-24: qty 1

## 2018-02-24 MED ORDER — LIDOCAINE HCL 1 % IJ SOLN
INTRAMUSCULAR | Status: AC
  Filled 2018-02-24: qty 20

## 2018-02-24 MED ORDER — VANCOMYCIN HCL 1000 MG IV SOLR
INTRAVENOUS | Status: DC
  Filled 2018-02-24: qty 1000

## 2018-02-24 MED ORDER — ROSUVASTATIN CALCIUM 40 MG PO TABS
40.0000 mg | ORAL_TABLET | Freq: Every day | ORAL | Status: DC
  Administered 2018-02-24: 40 mg via ORAL
  Filled 2018-02-24: qty 1

## 2018-02-24 MED ORDER — ASPIRIN 81 MG PO CHEW: 81.0000 mg | CHEWABLE_TABLET | Freq: Every day | ORAL | Status: DC

## 2018-02-24 MED ORDER — SODIUM CHLORIDE 0.9 % IV SOLN
INTRAVENOUS | Status: DC
  Filled 2018-02-24: qty 30

## 2018-02-24 MED ORDER — OXYCODONE HCL 5 MG PO TABS: 5.0000 mg | ORAL_TABLET | ORAL | Status: DC | PRN

## 2018-02-24 MED ORDER — METOPROLOL TARTRATE 12.5 MG HALF TABLET
12.5000 mg | ORAL_TABLET | Freq: Once | ORAL | Status: AC
  Administered 2018-02-25: 12.5 mg via ORAL
  Filled 2018-02-24: qty 1

## 2018-02-24 MED ORDER — DOPAMINE-DEXTROSE 3.2-5 MG/ML-% IV SOLN
0.0000 ug/kg/min | INTRAVENOUS | Status: DC
  Filled 2018-02-24: qty 250

## 2018-02-24 SURGICAL SUPPLY — 13 items
CATH INFINITI 5FR JL4 (CATHETERS) ×2 IMPLANT
CATH INFINITI 5FR MPB2 (CATHETERS) ×2 IMPLANT
CATH INFINITI JR4 5F (CATHETERS) ×2 IMPLANT
CATH SWAN GANZ 7F STRAIGHT (CATHETERS) ×2 IMPLANT
COVER PRB 48X5XTLSCP FOLD TPE (BAG) ×1 IMPLANT
COVER PROBE 5X48 (BAG) ×1
GUIDEWIRE .025 260CM (WIRE) ×2 IMPLANT
KIT HEART LEFT (KITS) ×2 IMPLANT
PACK CARDIAC CATHETERIZATION (CUSTOM PROCEDURE TRAY) ×2 IMPLANT
SHEATH AVANTI 11CM 5FR (SHEATH) ×2 IMPLANT
SHEATH AVANTI 11CM 7FR (SHEATH) ×2 IMPLANT
TRANSDUCER W/STOPCOCK (MISCELLANEOUS) ×2 IMPLANT
WIRE EMERALD 3MM-J .035X150CM (WIRE) ×2 IMPLANT

## 2018-02-24 NOTE — Anesthesia Preprocedure Evaluation (Addendum)
Anesthesia Evaluation  Patient identified by MRN, date of birth, ID band Patient awake    Reviewed: Allergy & Precautions, NPO status , Patient's Chart, lab work & pertinent test results, reviewed documented beta blocker date and time   Airway Mallampati: III  TM Distance: >3 FB Neck ROM: Full    Dental  (+)    Pulmonary neg pulmonary ROS,    Pulmonary exam normal breath sounds clear to auscultation       Cardiovascular hypertension, Pt. on medications and Pt. on home beta blockers + CAD and + Cardiac Stents (x 3)  Normal cardiovascular exam Rhythm:Regular Rate:Normal  ECG: SR, rate 60  Critical distal left main coronary artery disease and the culprit for the patient developing flash pulmonary edema 2 weeks ago at his grandsons wedding in [place].  Syntax score less than 22. Patent proximal LAD stent with eccentric 50-70% stenosis beyond the stent distal margin in the mid LAD. Widely patent circumflex Widely patent right coronary including a distal RCA and continuation RCA stents that are widely patent.  A relatively small PDA contains 70% ostial narrowing. Normal right heart pressures. Normal left ventricular systolic function with normal LVEDP.  Estimated ejection fraction 55%.  Echo performed in [place] 2 weeks ago confirms normal LV function.  Sees cardiologist Tamala Julian)   Neuro/Psych PSYCHIATRIC DISORDERS Dementia Parkinson's disease    GI/Hepatic negative GI ROS, Neg liver ROS,   Endo/Other  negative endocrine ROS  Renal/GU negative Renal ROS     Musculoskeletal negative musculoskeletal ROS (+)   Abdominal (+) + obese,   Peds  Hematology HLD   Anesthesia Other Findings   Reproductive/Obstetrics                            Anesthesia Physical Anesthesia Plan  ASA: IV  Anesthesia Plan: General   Post-op Pain Management:    Induction: Intravenous  PONV Risk Score and Plan: 2 and  Midazolam, Dexamethasone, Ondansetron and Treatment may vary due to age or medical condition  Airway Management Planned: Oral ETT  Additional Equipment: Arterial line, CVP, TEE, PA Cath and Ultrasound Guidance Line Placement  Intra-op Plan:   Post-operative Plan: Post-operative intubation/ventilation  Informed Consent: I have reviewed the patients History and Physical, chart, labs and discussed the procedure including the risks, benefits and alternatives for the proposed anesthesia with the patient or authorized representative who has indicated his/her understanding and acceptance.   Dental advisory given  Plan Discussed with: CRNA  Anesthesia Plan Comments:         Anesthesia Quick Evaluation

## 2018-02-24 NOTE — Progress Notes (Addendum)
Pre-op Cardiac Surgery  Carotid Findings:  1-39% in bilateral carotid arteries. JE  Upper Extremity Right Left  Brachial Pressures 140 133  Radial Waveforms Triphasic Triphasic  Ulnar Waveforms Triphasic Triphasic  Palmar Arch (Allen's Test) Abnormal WNL   Findings:  Right ulnar decreased greater than 50% with compression.   Lower  Extremity Right Left  Dorsalis Pedis Triphasic/ 173 Triphasic / 153  Anterior Tibial    Posterior Tibial Triphasic / 161 Triphasic/ 173  Ankle/Brachial Indices 1.2  1.2    Findings:  Normal ABI at rest bilaterally. Oda Cogan, BS, RDMS, RVT

## 2018-02-24 NOTE — Interval H&P Note (Signed)
Cath Lab Visit (complete for each Cath Lab visit)  Clinical Evaluation Leading to the Procedure:   ACS: Yes.    Non-ACS:    Anginal Classification: CCS III  Anti-ischemic medical therapy: Minimal Therapy (1 class of medications)  Non-Invasive Test Results: No non-invasive testing performed  Prior CABG: No previous CABG      History and Physical Interval Note:  02/24/2018 11:12 AM   The patient developed acute diastolic heart failure while visiting in [place] during his grandsons wedding.  He is undergoing the procedure to exclude progression of coronary disease as the source of the cardiac event.  [name]  has presented today for surgery, with the diagnosis of Angina  The various methods of treatment have been discussed with the patient and family. After consideration of risks, benefits and other options for treatment, the patient has consented to  Procedure(s): RIGHT/LEFT HEART CATH AND CORONARY ANGIOGRAPHY (N/A) as a surgical intervention .  The patient's history has been reviewed, patient examined, no change in status, stable for surgery.  I have reviewed the patient's chart and labs.  Questions were answered to the patient's satisfaction.     Belva Crome III

## 2018-02-24 NOTE — Consult Note (Signed)
[address]       [place] [address]             [phone]          CARDIOTHORACIC SURGERY CONSULTATION REPORT  PCP is Shirline Frees, MD Referring Provider is Belva Crome, MD  Reason for consultation:  Left main coronary artery disease  HPI:  Patient is a 77 year old male with history of coronary artery disease, hypertension, hyperlipidemia, and mild Parkinson's disease who has been referred for surgical consultation to discuss treatment options for management of critical left main coronary artery disease.  The patient's cardiac history dates back to 2011 when he presented with symptoms of angina pectoris.  He underwent cardiac catheterization and was treated with PCI and stenting including drug-eluting stents in the proximal left anterior descending coronary artery, the distal right coronary artery, and the posterolateral branch of the right coronary artery.  He did well clinically and has been followed regularly ever since by Dr. Tamala Julian.  He was last seen in follow-up in October 2018 at which time he was doing well.  Patient was recently traveling in [place] for a wedding.  He was under considerable stress at the time.  He was awoken from his sleep in the early morning of February 13, 2018 with severe resting shortness of breath.  He was found to be hypoxic and taken directly to the emergency room at [facility].  Symptoms improved quickly.  Echocardiogram revealed normal left ventricular systolic function with ejection fraction estimated 55-60%.  There was restrictive left ventricular filling felt secondary to severe diastolic dysfunction.  There was severely dilated left atrium with moderate mitral regurgitation.  The patient was seen by a cardiologist in treated with IV diuretics and did remarkably well.  Troponin levels were slightly elevated but remained flat.  BNP level was 414.  The patient was advised to have a stress test and/or  cardiac catheterization, but the patient insisted on returning to [facility] for further workup.  He was brought in for elective cardiac catheterization by Dr. Tamala Julian earlier today.  Catheterization revealed critical left main coronary artery disease with preserved left ventricular function.  Cardiothoracic surgical consultation was requested.  Patient is married and lives locally in [place] with his wife.  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  He has remained functionally independent throughout retirement but he admits that he is not very active physically.  He has been less motivated to do any sort of physical activity although he does still mow the lawn in tend to jobs around the house.  He has mild chronic back pain but ambulates without assistance.  He has had some mild problems with balance, a tremor involving the left upper extremity, and some intermittent problems with mild confusion.  2 years ago he was evaluated by a neurologist and told that he may have early Parkinson's disease.  He is not on any sort of medical therapy.  He denies any recent history of exertional chest pain or chest tightness.  He has not had problems with shortness of breath other than that which occurred recently in [place].  He denies any history of PND, orthopnea, or lower extremity edema.  Past Medical History:  Diagnosis Date  . Arthritis   . Coronary artery disease   . Essential hypertension 04/21/2014  . Hyperlipidemia   . Parkinson's disease ([place]) 04/27/2015  2016   . Stented coronary artery 2011   x3    Past Surgical History:  Procedure Laterality Date  . APPENDECTOMY    . CARDIAC CATHETERIZATION  2011   3 stents  . COLONOSCOPY    . RIGHT/LEFT HEART CATH AND CORONARY ANGIOGRAPHY N/A 02/24/2018   Procedure: RIGHT/LEFT HEART CATH AND CORONARY ANGIOGRAPHY;  Surgeon: Belva Crome, MD;  Location: [facility];  Service:  Cardiovascular;  Laterality: N/A;  . TONSILLECTOMY      Family History  Problem Relation Age of Onset  . Heart attack Mother   . Cancer Father     Social History   Socioeconomic History  . Marital status: Married    Spouse name: Not on file  . Number of children: Not on file  . Years of education: Not on file  . Highest education level: Not on file  Occupational History  . Not on file  Social Needs  . Financial resource strain: Not on file  . Food insecurity:    Worry: Not on file    Inability: Not on file  . Transportation needs:    Medical: Not on file    Non-medical: Not on file  Tobacco Use  . Smoking status: Never Smoker  . Smokeless tobacco: Never Used  Substance and Sexual Activity  . Alcohol use: Yes    Alcohol/week: 0.0 oz    Comment: occ  . Drug use: No  . Sexual activity: Not on file  Lifestyle  . Physical activity:    Days per week: Not on file    Minutes per session: Not on file  . Stress: Not on file  Relationships  . Social connections:    Talks on phone: Not on file    Gets together: Not on file    Attends religious service: Not on file    Active member of club or organization: Not on file    Attends meetings of clubs or organizations: Not on file    Relationship status: Not on file  . Intimate partner violence:    Fear of current or ex partner: Not on file    Emotionally abused: Not on file    Physically abused: Not on file    Forced sexual activity: Not on file  Other Topics Concern  . Not on file  Social History Narrative  . Not on file    Prior to Admission medications   Medication Sig Start Date End Date Taking? Authorizing Provider  aspirin 81 MG tablet Take 81 mg by mouth daily.   Yes [provider]  furosemide (LASIX) 20 MG tablet Take 2 tablets by mouth for 2 days then go back to 1 tablet by mouth daily 02/18/18  Yes Bhagat, Bhavinkumar, PA  GLUCOSAMINE-CHONDROITIN PO Take 1 tablet by mouth daily.   Yes [provider]  Ibuprofen-diphenhydrAMINE Cit (ADVIL PM PO) Take 1-2 tablets by mouth at bedtime as needed (for sleep).   Yes [provider]  lisinopril (ZESTRIL) 5 MG tablet Take 5 mg by mouth daily.  02/14/18  Yes [provider]  Multiple Vitamins-Minerals (ONE-A-DAY 50 PLUS PO) Take 1 tablet by mouth daily.   Yes [provider]  rosuvastatin (CRESTOR) 40 MG tablet Take 1 tablet (40 mg total) by mouth daily. Patient taking differently: Take 20 mg by mouth daily.  09/12/17 09/07/18 Yes Belva Crome, MD    Current Facility-Administered Medications  Medication Dose Route Frequency Provider Last Rate Last Dose  .  0.9 %  sodium chloride infusion  250 mL Intravenous PRN Belva Crome, MD      . acetaminophen (TYLENOL) tablet 650 mg  650 mg Oral Q4H PRN Belva Crome, MD      . Derrill Memo ON 02/25/2018] aspirin EC tablet 81 mg  81 mg Oral Daily Belva Crome, MD      . Derrill Memo ON 02/25/2018] dexmedetomidine (PRECEDEX) 400 MCG/100ML (4 mcg/mL) infusion  0.1-0.7 mcg/kg/hr Intravenous To OR Belva Crome, MD      . Derrill Memo ON 02/25/2018] DOPamine (INTROPIN) 800 mg in dextrose 5 % 250 mL (3.2 mg/mL) infusion  0-10 mcg/kg/min Intravenous To OR Belva Crome, MD      . Derrill Memo ON 02/25/2018] EPINEPHrine (ADRENALIN) 4 mg in dextrose 5 % 250 mL (0.016 mg/mL) infusion  0-10 mcg/min Intravenous To OR Belva Crome, MD      . Derrill Memo ON 02/25/2018] heparin 2,500 Units, papaverine 30 mg in electrolyte-148 (PLASMALYTE-148) 500 mL irrigation   Irrigation To OR Belva Crome, MD      . Derrill Memo ON 02/25/2018] heparin 30,000 units/NS 1000 mL solution for CELLSAVER   Other To OR Belva Crome, MD      . heparin ADULT infusion 100 units/mL (25000 units/253mL sodium chloride 0.45%)  1,100 Units/hr Intravenous Continuous Lyndee Leo, RPH      . [START ON 02/25/2018] insulin regular (NOVOLIN R,HUMULIN R) 100 Units in sodium chloride 0.9 % 100 mL (1 Units/mL) infusion   Intravenous To OR Belva Crome,  MD      . Derrill Memo ON 02/25/2018] [facility] Blood Cardioplegia (KBC) lidocaine 2% Syringe (68mL)  13 mL Intracoronary Once Belva Crome, MD      . Derrill Memo ON 02/25/2018] [place] Blood Cardioplegia (KBC) lidocaine 2% Syringe (34mL)  13 mL Intracoronary Once Belva Crome, MD      . Derrill Memo ON 02/25/2018] [facility] Blood Cardioplegia (KBC) mannitol 20% Syringe (56mL)  32 mL Intracoronary Once Belva Crome, MD      . Derrill Memo ON 02/25/2018] [place] Blood Cardioplegia (KBC) mannitol 20% Syringe (15mL)  32 mL Intracoronary Once Belva Crome, MD      . Derrill Memo ON 02/25/2018] levofloxacin (LEVAQUIN) IVPB 500 mg  500 mg Intravenous To OR Belva Crome, MD      . Derrill Memo ON 02/25/2018] lisinopril (PRINIVIL,ZESTRIL) tablet 5 mg  5 mg Oral Daily Belva Crome, MD      . Derrill Memo ON 02/25/2018] magnesium sulfate (IV Push/IM) injection 40 mEq  40 mEq Other To OR Belva Crome, MD      . Derrill Memo ON 02/25/2018] nitroGLYCERIN 50 mg in dextrose 5 % 250 mL (0.2 mg/mL) infusion  2-200 mcg/min Intravenous To OR Belva Crome, MD      . ondansetron [facility]) injection 4 mg  4 mg Intravenous Q6H PRN Belva Crome, MD      . oxyCODONE (Oxy IR/ROXICODONE) immediate release tablet 5-10 mg  5-10 mg Oral Q4H PRN Belva Crome, MD      . Derrill Memo ON 02/25/2018] phenylephrine (NEO-SYNEPHRINE) 20 mg in sodium chloride 0.9 % 250 mL (0.08 mg/mL) infusion  30-200 mcg/min Intravenous To OR Belva Crome, MD      . Derrill Memo ON 02/25/2018] potassium chloride injection 80 mEq  80 mEq Other To OR Belva Crome, MD      . rosuvastatin (CRESTOR) tablet 40 mg  40 mg Oral q1800 Belva Crome, MD      .  sodium chloride flush (NS) 0.9 % injection 3 mL  3 mL Intravenous Q12H Belva Crome, MD   3 mL at 02/24/18 1637  . sodium chloride flush (NS) 0.9 % injection 3 mL  3 mL Intravenous PRN Belva Crome, MD      . Derrill Memo ON 02/25/2018] tranexamic acid (CYKLOKAPRON) 2,500 mg in sodium chloride 0.9 % 250 mL (10 mg/mL) infusion  1.5 mg/kg/hr Intravenous To OR  Belva Crome, MD      . Derrill Memo ON 02/25/2018] tranexamic acid (CYKLOKAPRON) bolus via infusion - over 30 minutes 1,387.5 mg  15 mg/kg Intravenous To OR Belva Crome, MD      . Derrill Memo ON 02/25/2018] tranexamic acid (CYKLOKAPRON) pump prime solution 185 mg  2 mg/kg Intracatheter To OR Belva Crome, MD      . Derrill Memo ON 02/25/2018] vancomycin (VANCOCIN) 1,000 mg in sodium chloride 0.9 % 1,000 mL irrigation   Irrigation To OR Belva Crome, MD      . Derrill Memo ON 02/25/2018] vancomycin (VANCOCIN) 1,250 mg in sodium chloride 0.9 % 250 mL IVPB  1,250 mg Intravenous To OR Belva Crome, MD        Allergies  Allergen Reactions  . Levaquin [Levofloxacin Hemihydrate] Itching and Rash  . Penicillins Itching, Rash and Other (See Comments)    Has patient had a PCN reaction causing immediate rash, facial/tongue/throat swelling, SOB or lightheadedness with hypotension: Unknown Has patient had a PCN reaction causing severe rash involving mucus membranes or skin necrosis: No Has patient had a PCN reaction that required hospitalization: No Has patient had a PCN reaction occurring within the last 10 years: No If all of the above answers are "NO", then may proceed with Cephalosporin use.       Review of Systems:   General:  normal appetite, decreased energy, no weight gain, no weight loss, no fever  Cardiac:  no chest pain with exertion, no chest pain at rest, +SOB with no exertion, + recent resting SOB, + recent PND, no orthopnea, no palpitations, no arrhythmia, no atrial fibrillation, no LE edema, no dizzy spells, no syncope  Respiratory:  + shortness of breath, no home oxygen, no productive cough, + dry cough, no bronchitis, no wheezing, no hemoptysis, no asthma, no pain with inspiration or cough, no sleep apnea, no CPAP at night  GI:   no difficulty swallowing, no reflux, no frequent heartburn, no hiatal hernia, no abdominal pain, no constipation, no diarrhea, no hematochezia, no hematemesis, no  melena  GU:   no dysuria,  no frequency, no urinary tract infection, no hematuria, no enlarged prostate, no kidney stones, no kidney disease  Vascular:  no pain suggestive of claudication, no pain in feet, no leg cramps, no varicose veins, no DVT, no non-healing foot ulcer  Neuro:   no stroke, no TIA's, no seizures, no headaches, no temporary blindness one eye,  no slurred speech, no peripheral neuropathy, mild chronic pain, mild instability of gait, + memory/cognitive dysfunction  Musculoskeletal: + arthritis - primarily involving the lower back, no joint swelling, no myalgias, no difficulty walking, normal mobility   Skin:   no rash, no itching, no skin infections, no pressure sores or ulcerations  Psych:   no anxiety, no depression, no nervousness, + unusual recent stress  Eyes:   no blurry vision, no floaters, no recent vision changes, + wears glasses or contacts  ENT:   no hearing loss, no loose or painful teeth, no dentures  Hematologic:  no easy bruising, no abnormal bleeding, no clotting disorder, no frequent epistaxis  Endocrine:  no diabetes, does not check CBG's at home     Physical Exam:   BP 123/66   Pulse (!) 56   Temp 97.9 F (36.6 C) (Oral)   Resp 12   Ht 5\' 10"  (1.778 m)   Wt 204 lb (92.5 kg)   SpO2 94%   BMI 29.27 kg/m   General:    well-appearing  HEENT:  Unremarkable   Neck:   no JVD, no bruits, no adenopathy   Chest:   clear to auscultation, symmetrical breath sounds, no wheezes, no rhonchi   CV:   RRR, no  murmur   Abdomen:  soft, non-tender, no masses   Extremities:  warm, well-perfused, pulses palpable, no lower extremity edema  Rectal/GU  Deferred  Neuro:   Grossly non-focal and symmetrical throughout  Skin:   Clean and dry, no rashes, no breakdown  Diagnostic Tests:  RIGHT/LEFT HEART CATH AND CORONARY ANGIOGRAPHY  Conclusion    Critical distal left main coronary artery disease and the culprit for the patient developing flash pulmonary edema 2 weeks  ago at his grandsons wedding in [place].  Syntax score less than 22.  Patent proximal LAD stent with eccentric 50-70% stenosis beyond the stent distal margin in the mid LAD.  Widely patent circumflex  Widely patent right coronary including a distal RCA and continuation RCA stents that are widely patent.  A relatively small PDA contains 70% ostial narrowing.  Normal right heart pressures.  Normal left ventricular systolic function with normal LVEDP.  Estimated ejection fraction 55%.  Echo performed in [place] 2 weeks ago confirms normal LV function.  RECOMMENDATIONS:   Heart team approach with TCTS.  He has severe distal left main disease and would probably fare better over the long-term with coronary bypass grafting.  The Syntax score is relatively low and stenting could be done if for some reason he is not a surgical candidate.  Due to the unpredictability and the patient's clinical course with recent flash pulmonary edema, will hospitalize, anticoagulate, and manage further after heart team approach once best decision for treatment is determined.  Indications   Acute diastolic heart failure (HCC) [I50.31 (ICD-10-CM)]  CAD in native artery [I25.10 (ICD-10-CM)]  Procedural Details/Technique   Technical Details The right inguinal region was sterilely prepped and draped. 1% Xylocaine was used for local anesthesia. Sedation was achieved with fentanyl and Versed. The right femoral vein and artery were visualized with real-time vascular ultrasound after using fluoroscopy to identify the femoral head. Xylocaine was then used to anesthetize the area. After proper anesthesia, a Seldinger needle was used with the assistance of real-time vascular ultrasound to perform an anterior wall stick on the femoral vein. The sheath was then inserted using the Seldinger technique. Right heart catheterization was then performed using a 7 French Swan-Ganz catheter. A .025 centimeter steerable guidewire was used  after we were unable to manipulate the Swan into the pulmonary artery. The guidewire was used to into the left pulmonary artery and the Luiz Blare was then advanced over the guidewire without significant difficulty. Hemodynamic recordings were made in each chamber. An oxygen saturation was obtained in the main pulmonary artery. A pulmonary capillary wedge pressure was obtained. A main pulmonary artery O2 saturation was obtained near simultaneously with an arterial O2 saturation.  Using real-time vascular ultrasound, a Seldinger needle needle was then used to perform an anterior wall common femoral artery stick. A VUS image  was saved for the permanent record. A 5 [place] sheath was inserted using the modified Seldinger technique. Coronary angiography was then performed using a 5 [place] JL4 and JR4. A left coronary sinus injection was performed before selective engagement with the left Judkins and identified distal left main obstruction.. Left ventriculography was performed using a 5 French B2 MP. The left ventricle was opacified by and.  During this procedure the patient is administered a total of Versed 1 mg and Fentanyl 50 mg to achieve and maintain moderate conscious sedation. The patient's heart rate, blood pressure, and oxygen saturation are monitored continuously during the procedure. The period of conscious sedation is 49 minutes, of which I was present face-to-face 100% of this time.   Estimated blood loss <50 mL.  During this procedure the patient was administered the following to achieve and maintain moderate conscious sedation: Versed 1 mg, Fentanyl 50 mcg, while the patient's heart rate, blood pressure, and oxygen saturation were continuously monitored. The period of conscious sedation was 49 minutes, of which I was present face-to-face 100% of this time.  Coronary Findings   Diagnostic  Dominance: Right  Left Anterior Descending  Ost LAD to Prox LAD lesion 20% stenosed  Ost LAD to Prox LAD  lesion is 20% stenosed.  Prox LAD lesion 70% stenosed  Prox LAD lesion is 70% stenosed.  Left Circumflex  Prox Cx to Mid Cx lesion 30% stenosed  Prox Cx to Mid Cx lesion is 30% stenosed.  Right Coronary Artery  Mid RCA lesion 50% stenosed  Mid RCA lesion is 50% stenosed.  Dist RCA lesion 20% stenosed  Dist RCA lesion is 20% stenosed. The lesion was previously treated.  Right Posterior Descending Artery  Vessel is small in size.  Ost RPDA lesion 75% stenosed  Ost RPDA lesion is 75% stenosed.  Right Posterior Atrioventricular Branch  Post Atrio lesion 30% stenosed  Post Atrio lesion is 30% stenosed. The lesion was previously treated.  First Right Posterolateral  Vessel is small in size.  Intervention   No interventions have been documented.  Right Heart   Right Heart Pressures Hemodynamic findings consistent with pulmonary hypertension. LV EDP is normal.  Wall Motion              Left Heart   Left Ventricle The left ventricular size is normal. The left ventricular systolic function is normal. LV end diastolic pressure is normal. The left ventricular ejection fraction is 50-55% by visual estimate. No regional wall motion abnormalities.  Coronary Diagrams   Diagnostic Diagram       Implants    No implant documentation for this case.  MERGE Images   Show images for CARDIAC CATHETERIZATION   Link to Procedure Log   Procedure Log    Hemo Data    Most Recent Value  Fick Cardiac Output 4.05 L/min  Fick Cardiac Output Index 1.93 (L/min)/BSA  RA A Wave 6 mmHg  RA V Wave 6 mmHg  RA Mean 3 mmHg  RV Systolic Pressure 26 mmHg  RV Diastolic Pressure 1 mmHg  RV EDP 4 mmHg  PA Systolic Pressure 22 mmHg  PA Diastolic Pressure 4 mmHg  PA Mean 13 mmHg  PW A Wave 11 mmHg  PW V Wave 7 mmHg  PW Mean 7 mmHg  AO Systolic Pressure 846 mmHg  AO Diastolic Pressure 52 mmHg  AO Mean 75 mmHg  LV Systolic Pressure 962 mmHg  LV Diastolic Pressure 2 mmHg  LV EDP 8 mmHg  Arterial Occlusion Pressure Extended Systolic Pressure 035 mmHg  Arterial Occlusion Pressure Extended Diastolic Pressure 54 mmHg  Arterial Occlusion Pressure Extended Mean Pressure 75 mmHg  Left Ventricular Apex Extended Systolic Pressure 465 mmHg  Left Ventricular Apex Extended Diastolic Pressure 3 mmHg  Left Ventricular Apex Extended EDP Pressure 9 mmHg  QP/QS 1  TPVR Index 6.75 HRUI  TSVR Index 38.92 HRUI  PVR SVR Ratio 0.08  TPVR/TSVR Ratio 0.17      Impression:  Patient has critical left main coronary artery disease with preserved left ventricular systolic function.  He presents with recent episode of acute diastolic congestive heart failure consistent with unstable angina related to global ischemia caused by the patient's high-grade left main coronary artery stenosis.  I agree the patient would best be treated with surgical revascularization.  Risks of surgery should be relatively low but may be slightly elevated because of the patient's age and history of mild Parkinson's disease with some signs of early dementia.    Plan:  I have reviewed the indications, risks, and potential benefits of coronary artery bypass grafting with the patient and his wife.  Alternative treatment strategies have been discussed, including the relative risks, benefits and long term prognosis associated with medical therapy, percutaneous coronary intervention, and surgical revascularization.  Expectations for the patient's postoperative convalescence have been discussed.  The patient understands and accepts all potential associated risks of surgery including but not limited to risk of death, stroke or other neurologic complication, myocardial infarction, congestive heart failure, respiratory failure, renal failure, bleeding requiring blood transfusion and/or reexploration, aortic dissection or other major vascular complication, arrhythmia, heart block or bradycardia requiring permanent pacemaker, pneumonia,  pleural effusion, wound infection, pulmonary embolus or other thromboembolic complication, chronic pain or other delayed complications related to median sternotomy, or the late recurrence of symptomatic ischemic heart disease and/or congestive heart failure.  The importance of long term risk modification have been emphasized.  All questions answered.  We plan to proceed with surgery tomorrow.   I spent in excess of 120 minutes during the conduct of this hospital consultation and >50% of this time involved direct face-to-face encounter for counseling and/or coordination of the patient's care.    Valentina Gu. Roxy Manns, MD 02/24/2018 4:51 PM

## 2018-02-24 NOTE — Progress Notes (Signed)
ANTICOAGULATION CONSULT NOTE - Initial Consult  Pharmacy Consult for heparin  Indication: chest pain/ACS  Allergies  Allergen Reactions  . Levaquin [Levofloxacin Hemihydrate] Itching and Rash  . Penicillins Itching, Rash and Other (See Comments)    Has patient had a PCN reaction causing immediate rash, facial/tongue/throat swelling, SOB or lightheadedness with hypotension: Unknown Has patient had a PCN reaction causing severe rash involving mucus membranes or skin necrosis: No Has patient had a PCN reaction that required hospitalization: No Has patient had a PCN reaction occurring within the last 10 years: No If all of the above answers are "NO", then may proceed with Cephalosporin use.     Patient Measurements: Height: 5\' 10"  (177.8 cm) Weight: 204 lb (92.5 kg) IBW/kg (Calculated) : 73 Heparin Dosing Weight: 92kg  Vital Signs: Temp: 97.9 F (36.6 C) (04/02 0956) Temp Source: Oral (04/02 0956) BP: 140/59 (04/02 1450) Pulse Rate: 50 (04/02 1450)  Labs: Recent Labs    02/24/18 0957  HGB 14.5  HCT 42.5  PLT 223    Estimated Creatinine Clearance: 59.4 mL/min (by C-G formula based on SCr of 1.19 mg/dL).   Medical History: Past Medical History:  Diagnosis Date  . Arthritis   . Coronary artery disease   . Hyperlipidemia   . Stented coronary artery 2011   x3    Assessment: 77 year old male s/p cath this morning found to have severe distal left main CAD. Patient to be evaluated by TCTS for consideration of CABG vs PCI. Will start IV heparin this evening.  Sheath removal 1300, site level 0 Start heparin at 2300 tonight  Goal of Therapy:  Heparin level 0.3-0.7 units/ml Monitor platelets by anticoagulation protocol: Yes   Plan:  Start heparin infusion at 1100 units/hr Check anti-Xa level in 8 hours and daily while on heparin Continue to monitor H&H and platelets  Erin Hearing PharmD., BCPS Clinical Pharmacist 02/24/2018 3:09 PM

## 2018-02-24 NOTE — Progress Notes (Addendum)
Site area: RFA/RFV Site Prior to Removal:  Level 0 Pressure Applied For:25 min Manual:   yes Patient Status During Pull:  stable Post Pull Site:  Level 0 Post Pull Instructions Given: yes  Post Pull Pulses Present: palpable Dressing Applied:  tegaderm Bedrest begins @ 1300 till 1700 Comments: Hannah Bope/canderson

## 2018-02-25 ENCOUNTER — Inpatient Hospital Stay (HOSPITAL_COMMUNITY): Payer: Medicare Other | Admitting: Anesthesiology

## 2018-02-25 ENCOUNTER — Ambulatory Visit: Payer: Medicare Other | Admitting: Physician Assistant

## 2018-02-25 ENCOUNTER — Encounter (HOSPITAL_COMMUNITY): Payer: Self-pay | Admitting: Anesthesiology

## 2018-02-25 ENCOUNTER — Encounter (HOSPITAL_COMMUNITY)
Admission: RE | Disposition: A | Discharge status: Home Health | Payer: Self-pay | Source: Ambulatory Visit | Attending: Thoracic Surgery (Cardiothoracic Vascular Surgery)

## 2018-02-25 ENCOUNTER — Inpatient Hospital Stay (HOSPITAL_COMMUNITY): Payer: Medicare Other

## 2018-02-25 DIAGNOSIS — I5031 Acute diastolic (congestive) heart failure: Secondary | ICD-10-CM

## 2018-02-25 DIAGNOSIS — Z951 Presence of aortocoronary bypass graft: Secondary | ICD-10-CM

## 2018-02-25 HISTORY — DX: Presence of aortocoronary bypass graft: Z95.1

## 2018-02-25 HISTORY — PX: CORONARY ARTERY BYPASS GRAFT: SHX141

## 2018-02-25 HISTORY — PX: TEE WITHOUT CARDIOVERSION: SHX5443

## 2018-02-25 LAB — APTT
aPTT: 57 seconds — ABNORMAL HIGH (ref 24–36)
aPTT: 31 seconds (ref 24–36)

## 2018-02-25 LAB — POCT I-STAT 3, ART BLOOD GAS (G3+)
Acid-Base Excess: 1 mmol/L (ref 0.0–2.0)
Acid-base deficit: 2 mmol/L (ref 0.0–2.0)
Acid-base deficit: 5 mmol/L — ABNORMAL HIGH (ref 0.0–2.0)
Bicarbonate: 22.8 mmol/L (ref 20.0–28.0)
Bicarbonate: 21.1 mmol/L (ref 20.0–28.0)
Bicarbonate: 26.2 mmol/L (ref 20.0–28.0)
O2 Saturation: 98 %
O2 Saturation: 95 %
O2 Saturation: 98 %
Patient temperature: 36
Patient temperature: 36.9
TCO2: 24 mmol/L (ref 22–32)
TCO2: 22 mmol/L (ref 22–32)
TCO2: 28 mmol/L (ref 22–32)
pCO2 arterial: 35 mmHg (ref 32.0–48.0)
pCO2 arterial: 41.9 mmHg (ref 32.0–48.0)
pCO2 arterial: 45.4 mmHg (ref 32.0–48.0)
pH, Arterial: 7.417 (ref 7.350–7.450)
pH, Arterial: 7.31 — ABNORMAL LOW (ref 7.350–7.450)
pH, Arterial: 7.37 (ref 7.350–7.450)
pO2, Arterial: 96 mmHg (ref 83.0–108.0)
pO2, Arterial: 82 mmHg — ABNORMAL LOW (ref 83.0–108.0)
pO2, Arterial: 102 mmHg (ref 83.0–108.0)

## 2018-02-25 LAB — BASIC METABOLIC PANEL
Anion gap: 11 (ref 5–15)
BUN: 22 mg/dL — ABNORMAL HIGH (ref 6–20)
CO2: 26 mmol/L (ref 22–32)
Calcium: 9.2 mg/dL (ref 8.9–10.3)
Chloride: 103 mmol/L (ref 101–111)
Creatinine, Ser: 1.11 mg/dL (ref 0.61–1.24)
GFR calc Af Amer: >60 mL/min (ref >60)
GFR calc non Af Amer: >60 mL/min (ref >60)
Glucose, Bld: 105 mg/dL — ABNORMAL HIGH (ref 65–99)
Potassium: 4.2 mmol/L (ref 3.5–5.1)
Sodium: 140 mmol/L (ref 135–145)

## 2018-02-25 LAB — POCT I-STAT 4, (NA,K, GLUC, HGB,HCT)
Glucose, Bld: 119 mg/dL — ABNORMAL HIGH (ref 65–99)
HCT: 32 % — ABNORMAL LOW (ref 39.0–52.0)
Hemoglobin: 10.9 g/dL — ABNORMAL LOW (ref 13.0–17.0)
Potassium: 3.9 mmol/L (ref 3.5–5.1)
Sodium: 144 mmol/L (ref 135–145)

## 2018-02-25 LAB — CBC
HCT: 41.1 % (ref 39.0–52.0)
HCT: 33.1 % — ABNORMAL LOW (ref 39.0–52.0)
Hemoglobin: 13.4 g/dL (ref 13.0–17.0)
Hemoglobin: 10.9 g/dL — ABNORMAL LOW (ref 13.0–17.0)
MCH: 30.1 pg (ref 26.0–34.0)
MCH: 29.5 pg (ref 26.0–34.0)
MCHC: 32.6 g/dL (ref 30.0–36.0)
MCHC: 32.9 g/dL (ref 30.0–36.0)
MCV: 92.4 fL (ref 78.0–100.0)
MCV: 89.7 fL (ref 78.0–100.0)
Platelets: 214 10*3/uL (ref 150–400)
Platelets: 144 10*3/uL — ABNORMAL LOW (ref 150–400)
RBC: 4.45 MIL/uL (ref 4.22–5.81)
RBC: 3.69 MIL/uL — ABNORMAL LOW (ref 4.22–5.81)
RDW: 13.6 % (ref 11.5–15.5)
RDW: 13.2 % (ref 11.5–15.5)
WBC: 7.4 10*3/uL (ref 4.0–10.5)
WBC: 11 10*3/uL — ABNORMAL HIGH (ref 4.0–10.5)

## 2018-02-25 LAB — SURGICAL PCR SCREEN
MRSA, PCR: NEGATIVE
Staphylococcus aureus: NEGATIVE

## 2018-02-25 LAB — CBC WITH DIFFERENTIAL/PLATELET
Basophils Absolute: 0 10*3/uL (ref 0.0–0.1)
Basophils Relative: 0 %
Eosinophils Absolute: 0 10*3/uL (ref 0.0–0.7)
Eosinophils Relative: 0 %
HCT: 32.8 % — ABNORMAL LOW (ref 39.0–52.0)
Hemoglobin: 11.1 g/dL — ABNORMAL LOW (ref 13.0–17.0)
Lymphocytes Relative: 7 %
Lymphs Abs: 1 10*3/uL (ref 0.7–4.0)
MCH: 30.7 pg (ref 26.0–34.0)
MCHC: 33.8 g/dL (ref 30.0–36.0)
MCV: 90.6 fL (ref 78.0–100.0)
Monocytes Absolute: 0.7 10*3/uL (ref 0.1–1.0)
Monocytes Relative: 5 %
Neutro Abs: 12.9 10*3/uL — ABNORMAL HIGH (ref 1.7–7.7)
Neutrophils Relative %: 88 %
Platelets: 188 10*3/uL (ref 150–400)
RBC: 3.62 MIL/uL — ABNORMAL LOW (ref 4.22–5.81)
RDW: 13.4 % (ref 11.5–15.5)
WBC: 14.6 10*3/uL — ABNORMAL HIGH (ref 4.0–10.5)

## 2018-02-25 LAB — PROTIME-INR
INR: 1.4
Prothrombin Time: 17 seconds — ABNORMAL HIGH (ref 11.4–15.2)

## 2018-02-25 LAB — GLUCOSE, CAPILLARY
Glucose-Capillary: 103 mg/dL — ABNORMAL HIGH (ref 65–99)
Glucose-Capillary: 92 mg/dL (ref 65–99)
Glucose-Capillary: 114 mg/dL — ABNORMAL HIGH (ref 65–99)
Glucose-Capillary: 146 mg/dL — ABNORMAL HIGH (ref 65–99)
Glucose-Capillary: 142 mg/dL — ABNORMAL HIGH (ref 65–99)
Glucose-Capillary: 120 mg/dL — ABNORMAL HIGH (ref 65–99)
Glucose-Capillary: 123 mg/dL — ABNORMAL HIGH (ref 65–99)
Glucose-Capillary: 102 mg/dL — ABNORMAL HIGH (ref 65–99)
Glucose-Capillary: 122 mg/dL — ABNORMAL HIGH (ref 65–99)

## 2018-02-25 LAB — HEMOGLOBIN AND HEMATOCRIT, BLOOD
HCT: 32.2 % — ABNORMAL LOW (ref 39.0–52.0)
Hemoglobin: 10.6 g/dL — ABNORMAL LOW (ref 13.0–17.0)

## 2018-02-25 LAB — POCT I-STAT, CHEM 8
BUN: 16 mg/dL (ref 6–20)
Calcium, Ion: 1.17 mmol/L (ref 1.15–1.40)
Chloride: 106 mmol/L (ref 101–111)
Creatinine, Ser: 0.9 mg/dL (ref 0.61–1.24)
Glucose, Bld: 133 mg/dL — ABNORMAL HIGH (ref 65–99)
HCT: 32 % — ABNORMAL LOW (ref 39.0–52.0)
Hemoglobin: 10.9 g/dL — ABNORMAL LOW (ref 13.0–17.0)
Potassium: 4.2 mmol/L (ref 3.5–5.1)
Sodium: 142 mmol/L (ref 135–145)
TCO2: 23 mmol/L (ref 22–32)

## 2018-02-25 LAB — ECHO INTRAOPERATIVE TEE
Height: 70 in
Weight: 3347.2 oz

## 2018-02-25 LAB — MAGNESIUM: Magnesium: 2.8 mg/dL — ABNORMAL HIGH (ref 1.7–2.4)

## 2018-02-25 LAB — HEPARIN LEVEL (UNFRACTIONATED): Heparin Unfractionated: 0.16 IU/mL — ABNORMAL LOW (ref 0.30–0.70)

## 2018-02-25 LAB — PLATELET COUNT: Platelets: 193 10*3/uL (ref 150–400)

## 2018-02-25 LAB — CREATININE, SERUM
Creatinine, Ser: 0.94 mg/dL (ref 0.61–1.24)
GFR calc Af Amer: >60 mL/min (ref >60)
GFR calc non Af Amer: >60 mL/min (ref >60)

## 2018-02-25 SURGERY — CORONARY ARTERY BYPASS GRAFTING (CABG)
Anesthesia: General | Site: Chest

## 2018-02-25 MED ORDER — SODIUM CHLORIDE 0.9 % IJ SOLN
OROMUCOSAL | Status: DC | PRN
  Administered 2018-02-25 (×3): 4 mL via TOPICAL

## 2018-02-25 MED ORDER — METOPROLOL TARTRATE 5 MG/5ML IV SOLN
2.5000 mg | INTRAVENOUS | Status: DC | PRN
  Administered 2018-03-01: 2.5 mg via INTRAVENOUS
  Filled 2018-02-25: qty 5

## 2018-02-25 MED ORDER — CEFAZOLIN SODIUM-DEXTROSE 2-4 GM/100ML-% IV SOLN
2.0000 g | Freq: Three times a day (TID) | INTRAVENOUS | Status: AC
  Administered 2018-02-25 – 2018-02-27 (×6): 2 g via INTRAVENOUS
  Filled 2018-02-25 (×6): qty 100

## 2018-02-25 MED ORDER — ASPIRIN 81 MG PO CHEW: 324.0000 mg | CHEWABLE_TABLET | Freq: Every day | ORAL | Status: DC

## 2018-02-25 MED ORDER — ROCURONIUM BROMIDE 10 MG/ML (PF) SYRINGE
PREFILLED_SYRINGE | INTRAVENOUS | Status: AC
  Filled 2018-02-25: qty 5

## 2018-02-25 MED ORDER — PHENYLEPHRINE 40 MCG/ML (10ML) SYRINGE FOR IV PUSH (FOR BLOOD PRESSURE SUPPORT)
PREFILLED_SYRINGE | INTRAVENOUS | Status: DC | PRN
  Administered 2018-02-25: 80 ug via INTRAVENOUS

## 2018-02-25 MED ORDER — MIDAZOLAM HCL 5 MG/5ML IJ SOLN
INTRAMUSCULAR | Status: DC | PRN
  Administered 2018-02-25: 1 mg via INTRAVENOUS
  Administered 2018-02-25 (×2): 2 mg via INTRAVENOUS
  Administered 2018-02-25: 5 mg via INTRAVENOUS

## 2018-02-25 MED ORDER — METOPROLOL TARTRATE 25 MG/10 ML ORAL SUSPENSION: 12.5000 mg | Freq: Two times a day (BID) | ORAL | Status: DC

## 2018-02-25 MED ORDER — LACTATED RINGERS IV SOLN
INTRAVENOUS | Status: DC | PRN
  Administered 2018-02-25 (×2): via INTRAVENOUS

## 2018-02-25 MED ORDER — SODIUM CHLORIDE 0.9 % IV SOLN: 250.0000 mL | INTRAVENOUS | Status: DC

## 2018-02-25 MED ORDER — POTASSIUM CHLORIDE 10 MEQ/50ML IV SOLN
10.0000 meq | INTRAVENOUS | Status: AC
  Administered 2018-02-25 (×3): 10 meq via INTRAVENOUS

## 2018-02-25 MED ORDER — FENTANYL CITRATE (PF) 250 MCG/5ML IJ SOLN
INTRAMUSCULAR | Status: AC
  Filled 2018-02-25: qty 25

## 2018-02-25 MED ORDER — SODIUM CHLORIDE 0.9 % IV SOLN
INTRAVENOUS | Status: DC
  Filled 2018-02-25: qty 1

## 2018-02-25 MED ORDER — NITROGLYCERIN IN D5W 200-5 MCG/ML-% IV SOLN: 0.0000 ug/min | INTRAVENOUS | Status: DC

## 2018-02-25 MED ORDER — SODIUM CHLORIDE 0.9 % IV SOLN
0.0000 ug/min | INTRAVENOUS | Status: DC
  Filled 2018-02-25: qty 2

## 2018-02-25 MED ORDER — LACTATED RINGERS IV SOLN
INTRAVENOUS | Status: DC
  Administered 2018-02-25: 13:00:00 via INTRAVENOUS

## 2018-02-25 MED ORDER — CHLORHEXIDINE GLUCONATE 0.12% ORAL RINSE (MEDLINE KIT)
15.0000 mL | Freq: Two times a day (BID) | OROMUCOSAL | Status: DC
  Administered 2018-02-25 – 2018-02-26 (×2): 15 mL via OROMUCOSAL

## 2018-02-25 MED ORDER — ASPIRIN EC 325 MG PO TBEC
325.0000 mg | DELAYED_RELEASE_TABLET | Freq: Every day | ORAL | Status: DC
  Administered 2018-02-26 – 2018-03-02 (×5): 325 mg via ORAL
  Filled 2018-02-25 (×5): qty 1

## 2018-02-25 MED ORDER — LACTATED RINGERS IV SOLN: 500.0000 mL | Freq: Once | INTRAVENOUS | Status: DC | PRN

## 2018-02-25 MED ORDER — CHLORHEXIDINE GLUCONATE 0.12 % MT SOLN
15.0000 mL | OROMUCOSAL | Status: AC
  Administered 2018-02-25: 15 mL via OROMUCOSAL

## 2018-02-25 MED ORDER — LACTATED RINGERS IV SOLN
INTRAVENOUS | Status: DC | PRN
  Administered 2018-02-25 (×2): via INTRAVENOUS

## 2018-02-25 MED ORDER — MIDAZOLAM HCL 2 MG/2ML IJ SOLN
2.0000 mg | INTRAMUSCULAR | Status: DC | PRN
  Administered 2018-02-25 (×2): 2 mg via INTRAVENOUS
  Filled 2018-02-25 (×2): qty 2

## 2018-02-25 MED ORDER — MORPHINE SULFATE (PF) 2 MG/ML IV SOLN
1.0000 mg | INTRAVENOUS | Status: DC | PRN
  Administered 2018-02-25 (×2): 2 mg via INTRAVENOUS
  Filled 2018-02-25: qty 1

## 2018-02-25 MED ORDER — ALBUMIN HUMAN 5 % IV SOLN
250.0000 mL | INTRAVENOUS | Status: AC | PRN
  Administered 2018-02-25 (×2): 250 mL via INTRAVENOUS

## 2018-02-25 MED ORDER — SODIUM CHLORIDE 0.9 % IV SOLN
750.0000 mg | Freq: Three times a day (TID) | INTRAVENOUS | Status: DC
  Administered 2018-02-25: 750 mg via INTRAVENOUS
  Filled 2018-02-25: qty 750

## 2018-02-25 MED ORDER — OXYCODONE HCL 5 MG PO TABS: 5.0000 mg | ORAL_TABLET | ORAL | Status: DC | PRN

## 2018-02-25 MED ORDER — MAGNESIUM SULFATE 4 GM/100ML IV SOLN
4.0000 g | Freq: Once | INTRAVENOUS | Status: AC
  Administered 2018-02-25: 4 g via INTRAVENOUS
  Filled 2018-02-25: qty 100

## 2018-02-25 MED ORDER — PROTAMINE SULFATE 10 MG/ML IV SOLN
INTRAVENOUS | Status: AC
  Filled 2018-02-25: qty 5

## 2018-02-25 MED ORDER — ALBUMIN HUMAN 5 % IV SOLN
INTRAVENOUS | Status: DC | PRN
  Administered 2018-02-25: 12:00:00 via INTRAVENOUS

## 2018-02-25 MED ORDER — 0.9 % SODIUM CHLORIDE (POUR BTL) OPTIME
TOPICAL | Status: DC | PRN
  Administered 2018-02-25: 5000 mL

## 2018-02-25 MED ORDER — PANTOPRAZOLE SODIUM 40 MG PO TBEC
40.0000 mg | DELAYED_RELEASE_TABLET | Freq: Every day | ORAL | Status: DC
  Administered 2018-02-26 – 2018-03-04 (×7): 40 mg via ORAL
  Filled 2018-02-25 (×7): qty 1

## 2018-02-25 MED ORDER — SODIUM CHLORIDE 0.9 % IV SOLN
INTRAVENOUS | Status: DC
  Administered 2018-02-25: 13:00:00 via INTRAVENOUS

## 2018-02-25 MED ORDER — PROPOFOL 10 MG/ML IV BOLUS
INTRAVENOUS | Status: AC
  Filled 2018-02-25: qty 20

## 2018-02-25 MED ORDER — ARTIFICIAL TEARS OPHTHALMIC OINT
TOPICAL_OINTMENT | OPHTHALMIC | Status: DC | PRN
  Administered 2018-02-25: 1 via OPHTHALMIC

## 2018-02-25 MED ORDER — PROTAMINE SULFATE 10 MG/ML IV SOLN
INTRAVENOUS | Status: DC | PRN
  Administered 2018-02-25: 300 mg via INTRAVENOUS

## 2018-02-25 MED ORDER — BISACODYL 5 MG PO TBEC
10.0000 mg | DELAYED_RELEASE_TABLET | Freq: Every day | ORAL | Status: DC
  Administered 2018-02-27 – 2018-03-04 (×6): 10 mg via ORAL
  Filled 2018-02-25 (×7): qty 2

## 2018-02-25 MED ORDER — ACETAMINOPHEN 160 MG/5ML PO SOLN: 650.0000 mg | Freq: Once | ORAL | Status: AC

## 2018-02-25 MED ORDER — SODIUM CHLORIDE 0.9% FLUSH
10.0000 mL | Freq: Two times a day (BID) | INTRAVENOUS | Status: DC
  Administered 2018-02-25 – 2018-02-26 (×4): 10 mL

## 2018-02-25 MED ORDER — ACETAMINOPHEN 650 MG RE SUPP
650.0000 mg | Freq: Once | RECTAL | Status: AC
  Administered 2018-02-25: 650 mg via RECTAL

## 2018-02-25 MED ORDER — ORAL CARE MOUTH RINSE
15.0000 mL | Freq: Four times a day (QID) | OROMUCOSAL | Status: DC
  Administered 2018-02-27 – 2018-03-02 (×5): 15 mL via OROMUCOSAL

## 2018-02-25 MED ORDER — FENTANYL CITRATE (PF) 250 MCG/5ML IJ SOLN
INTRAMUSCULAR | Status: AC
  Filled 2018-02-25: qty 5

## 2018-02-25 MED ORDER — SODIUM CHLORIDE 0.9% FLUSH: 10.0000 mL | INTRAVENOUS | Status: DC | PRN

## 2018-02-25 MED ORDER — DEXAMETHASONE SODIUM PHOSPHATE 10 MG/ML IJ SOLN
INTRAMUSCULAR | Status: AC
  Filled 2018-02-25: qty 1

## 2018-02-25 MED ORDER — SODIUM CHLORIDE 0.9 % IV SOLN
INTRAVENOUS | Status: DC | PRN
  Administered 2018-02-25: 1000 mL

## 2018-02-25 MED ORDER — BISACODYL 10 MG RE SUPP: 10.0000 mg | Freq: Every day | RECTAL | Status: DC

## 2018-02-25 MED ORDER — SODIUM CHLORIDE 0.9 % IV SOLN
1.5000 g | INTRAVENOUS | Status: AC
  Administered 2018-02-25: 1.5 g via INTRAVENOUS
  Filled 2018-02-25: qty 1.5

## 2018-02-25 MED ORDER — PLASMA-LYTE 148 IV SOLN
INTRAVENOUS | Status: DC | PRN
  Administered 2018-02-25: 500 mL via INTRAVASCULAR

## 2018-02-25 MED ORDER — ONDANSETRON HCL 4 MG/2ML IJ SOLN
4.0000 mg | Freq: Four times a day (QID) | INTRAMUSCULAR | Status: DC | PRN
  Administered 2018-02-25: 4 mg via INTRAVENOUS
  Filled 2018-02-25: qty 2

## 2018-02-25 MED ORDER — PROTAMINE SULFATE 10 MG/ML IV SOLN
INTRAVENOUS | Status: AC
  Filled 2018-02-25: qty 25

## 2018-02-25 MED ORDER — METOPROLOL TARTRATE 12.5 MG HALF TABLET
12.5000 mg | ORAL_TABLET | Freq: Two times a day (BID) | ORAL | Status: DC
  Administered 2018-02-26 – 2018-03-02 (×10): 12.5 mg via ORAL
  Filled 2018-02-25 (×10): qty 1

## 2018-02-25 MED ORDER — DOCUSATE SODIUM 100 MG PO CAPS
200.0000 mg | ORAL_CAPSULE | Freq: Every day | ORAL | Status: DC
  Administered 2018-02-26 – 2018-02-27 (×2): 200 mg via ORAL
  Filled 2018-02-25 (×2): qty 2

## 2018-02-25 MED ORDER — MORPHINE SULFATE (PF) 2 MG/ML IV SOLN
1.0000 mg | INTRAVENOUS | Status: DC | PRN
  Administered 2018-02-25 (×4): 2 mg via INTRAVENOUS
  Administered 2018-02-26: 4 mg via INTRAVENOUS
  Filled 2018-02-25: qty 1
  Filled 2018-02-25 (×3): qty 2

## 2018-02-25 MED ORDER — SODIUM CHLORIDE 0.45 % IV SOLN: INTRAVENOUS | Status: DC | PRN

## 2018-02-25 MED ORDER — VANCOMYCIN HCL IN DEXTROSE 1-5 GM/200ML-% IV SOLN
1000.0000 mg | Freq: Once | INTRAVENOUS | Status: AC
  Administered 2018-02-25: 1000 mg via INTRAVENOUS
  Filled 2018-02-25: qty 200

## 2018-02-25 MED ORDER — INSULIN REGULAR BOLUS VIA INFUSION
0.0000 [IU] | Freq: Three times a day (TID) | INTRAVENOUS | Status: DC
  Filled 2018-02-25: qty 10

## 2018-02-25 MED ORDER — CHLORHEXIDINE GLUCONATE CLOTH 2 % EX PADS
6.0000 | MEDICATED_PAD | Freq: Every day | CUTANEOUS | Status: DC
  Administered 2018-02-25 – 2018-02-26 (×2): 6 via TOPICAL

## 2018-02-25 MED ORDER — PROPOFOL 10 MG/ML IV BOLUS
INTRAVENOUS | Status: DC | PRN
  Administered 2018-02-25: 150 mg via INTRAVENOUS
  Administered 2018-02-25: 30 mg via INTRAVENOUS

## 2018-02-25 MED ORDER — MIDAZOLAM HCL 10 MG/2ML IJ SOLN
INTRAMUSCULAR | Status: AC
  Filled 2018-02-25: qty 2

## 2018-02-25 MED ORDER — ACETAMINOPHEN 160 MG/5ML PO SOLN: 1000.0000 mg | Freq: Four times a day (QID) | ORAL | Status: DC

## 2018-02-25 MED ORDER — SODIUM CHLORIDE 0.9% FLUSH
3.0000 mL | Freq: Two times a day (BID) | INTRAVENOUS | Status: DC
  Administered 2018-02-26 – 2018-02-27 (×3): 3 mL via INTRAVENOUS

## 2018-02-25 MED ORDER — LACTATED RINGERS IV SOLN
INTRAVENOUS | Status: DC | PRN
  Administered 2018-02-25: 08:00:00 via INTRAVENOUS

## 2018-02-25 MED ORDER — FAMOTIDINE IN NACL 20-0.9 MG/50ML-% IV SOLN
20.0000 mg | Freq: Two times a day (BID) | INTRAVENOUS | Status: AC
  Administered 2018-02-25 (×2): 20 mg via INTRAVENOUS
  Filled 2018-02-25: qty 50

## 2018-02-25 MED ORDER — ARTIFICIAL TEARS OPHTHALMIC OINT
TOPICAL_OINTMENT | OPHTHALMIC | Status: AC
  Filled 2018-02-25: qty 3.5

## 2018-02-25 MED ORDER — ROCURONIUM BROMIDE 10 MG/ML (PF) SYRINGE
PREFILLED_SYRINGE | INTRAVENOUS | Status: DC | PRN
  Administered 2018-02-25: 50 mg via INTRAVENOUS
  Administered 2018-02-25: 40 mg via INTRAVENOUS
  Administered 2018-02-25: 60 mg via INTRAVENOUS
  Administered 2018-02-25: 50 mg via INTRAVENOUS

## 2018-02-25 MED ORDER — TRAMADOL HCL 50 MG PO TABS
50.0000 mg | ORAL_TABLET | ORAL | Status: DC | PRN
  Administered 2018-02-26 – 2018-02-27 (×3): 50 mg via ORAL
  Administered 2018-02-28: 100 mg via ORAL
  Administered 2018-03-01: 50 mg via ORAL
  Filled 2018-02-25 (×2): qty 1
  Filled 2018-02-25: qty 2
  Filled 2018-02-25 (×2): qty 1

## 2018-02-25 MED ORDER — FENTANYL CITRATE (PF) 250 MCG/5ML IJ SOLN
INTRAMUSCULAR | Status: DC | PRN
  Administered 2018-02-25: 100 ug via INTRAVENOUS
  Administered 2018-02-25: 200 ug via INTRAVENOUS
  Administered 2018-02-25 (×4): 150 ug via INTRAVENOUS
  Administered 2018-02-25: 250 ug via INTRAVENOUS
  Administered 2018-02-25: 50 ug via INTRAVENOUS
  Administered 2018-02-25 (×2): 150 ug via INTRAVENOUS

## 2018-02-25 MED ORDER — SODIUM CHLORIDE 0.9% FLUSH: 3.0000 mL | INTRAVENOUS | Status: DC | PRN

## 2018-02-25 MED ORDER — LACTATED RINGERS IV SOLN: INTRAVENOUS | Status: DC

## 2018-02-25 MED ORDER — ACETAMINOPHEN 500 MG PO TABS
1000.0000 mg | ORAL_TABLET | Freq: Four times a day (QID) | ORAL | Status: AC
  Administered 2018-02-25 – 2018-03-02 (×15): 1000 mg via ORAL
  Filled 2018-02-25 (×17): qty 2

## 2018-02-25 MED ORDER — ONDANSETRON HCL 4 MG/2ML IJ SOLN
INTRAMUSCULAR | Status: AC
  Filled 2018-02-25: qty 2

## 2018-02-25 MED ORDER — HEPARIN SODIUM (PORCINE) 1000 UNIT/ML IJ SOLN
INTRAMUSCULAR | Status: DC | PRN
  Administered 2018-02-25: 30000 [IU] via INTRAVENOUS

## 2018-02-25 MED ORDER — DEXMEDETOMIDINE HCL IN NACL 200 MCG/50ML IV SOLN
0.0000 ug/kg/h | INTRAVENOUS | Status: DC
  Administered 2018-02-25: 0.2 ug/kg/h via INTRAVENOUS
  Filled 2018-02-25 (×2): qty 50

## 2018-02-25 SURGICAL SUPPLY — 106 items
ADH SKN CLS APL DERMABOND .7 (GAUZE/BANDAGES/DRESSINGS) ×2
BAG DECANTER FOR FLEXI CONT (MISCELLANEOUS) ×6 IMPLANT
BANDAGE ACE 4X5 VEL STRL LF (GAUZE/BANDAGES/DRESSINGS) ×3 IMPLANT
BANDAGE ACE 6X5 VEL STRL LF (GAUZE/BANDAGES/DRESSINGS) ×3 IMPLANT
BASKET HEART (ORDER IN 25'S) (MISCELLANEOUS) ×1
BASKET HEART (ORDER IN 25S) (MISCELLANEOUS) ×2 IMPLANT
BLADE CLIPPER SURG (BLADE) IMPLANT
BLADE STERNUM SYSTEM 6 (BLADE) ×3 IMPLANT
BNDG GAUZE ELAST 4 BULKY (GAUZE/BANDAGES/DRESSINGS) ×3 IMPLANT
CANISTER SUCT 3000ML PPV (MISCELLANEOUS) ×3 IMPLANT
CANNULA EZ GLIDE AORTIC 21FR (CANNULA) ×3 IMPLANT
CATH CPB KIT OWEN (MISCELLANEOUS) ×3 IMPLANT
CATH THORACIC 36FR (CATHETERS) ×3 IMPLANT
CLIP RETRACTION 3.0MM CORONARY (MISCELLANEOUS) ×3 IMPLANT
CLIP VESOCCLUDE MED 24/CT (CLIP) IMPLANT
CLIP VESOCCLUDE SM WIDE 24/CT (CLIP) IMPLANT
CONN ST 1/4X3/8  BEN (MISCELLANEOUS) ×1
CONN ST 1/4X3/8 BEN (MISCELLANEOUS) ×2 IMPLANT
CRADLE DONUT ADULT HEAD (MISCELLANEOUS) ×3 IMPLANT
DERMABOND ADVANCED (GAUZE/BANDAGES/DRESSINGS) ×1
DERMABOND ADVANCED .7 DNX12 (GAUZE/BANDAGES/DRESSINGS) ×2 IMPLANT
DRAIN CHANNEL 32F RND 10.7 FF (WOUND CARE) ×6 IMPLANT
DRAPE CARDIOVASCULAR INCISE (DRAPES) ×3
DRAPE INCISE IOBAN 66X45 STRL (DRAPES) IMPLANT
DRAPE SLUSH/WARMER DISC (DRAPES) ×3 IMPLANT
DRAPE SRG 135X102X78XABS (DRAPES) ×2 IMPLANT
DRSG AQUACEL AG ADV 3.5X14 (GAUZE/BANDAGES/DRESSINGS) ×3 IMPLANT
DRSG COVADERM 4X14 (GAUZE/BANDAGES/DRESSINGS) IMPLANT
ELECT BLADE 4.0 EZ CLEAN MEGAD (MISCELLANEOUS) ×3
ELECT REM PT RETURN 9FT ADLT (ELECTROSURGICAL) ×6
ELECTRODE BLDE 4.0 EZ CLN MEGD (MISCELLANEOUS) ×2 IMPLANT
ELECTRODE REM PT RTRN 9FT ADLT (ELECTROSURGICAL) ×4 IMPLANT
FELT TEFLON 1X6 (MISCELLANEOUS) ×3 IMPLANT
GAUZE SPONGE 4X4 12PLY STRL (GAUZE/BANDAGES/DRESSINGS) ×6 IMPLANT
GLOVE BIO SURGEON STRL SZ 6 (GLOVE) ×3 IMPLANT
GLOVE BIO SURGEON STRL SZ 6.5 (GLOVE) ×3 IMPLANT
GLOVE BIO SURGEON STRL SZ7.5 (GLOVE) ×6 IMPLANT
GLOVE BIO SURGEON STRL SZ8 (GLOVE) ×3 IMPLANT
GLOVE BIOGEL PI IND STRL 6 (GLOVE) ×4 IMPLANT
GLOVE BIOGEL PI IND STRL 6.5 (GLOVE) ×2 IMPLANT
GLOVE BIOGEL PI IND STRL 7.0 (GLOVE) ×4 IMPLANT
GLOVE BIOGEL PI INDICATOR 6 (GLOVE) ×2
GLOVE BIOGEL PI INDICATOR 6.5 (GLOVE) ×1
GLOVE BIOGEL PI INDICATOR 7.0 (GLOVE) ×2
GLOVE ORTHO TXT STRL SZ7.5 (GLOVE) ×9 IMPLANT
GOWN STRL REUS W/ TWL LRG LVL3 (GOWN DISPOSABLE) ×16 IMPLANT
GOWN STRL REUS W/TWL LRG LVL3 (GOWN DISPOSABLE) ×24
HEMOSTAT POWDER SURGIFOAM 1G (HEMOSTASIS) ×9 IMPLANT
INSERT FOGARTY XLG (MISCELLANEOUS) ×3 IMPLANT
KIT BASIN OR (CUSTOM PROCEDURE TRAY) ×3 IMPLANT
KIT SUCTION CATH 14FR (SUCTIONS) ×9 IMPLANT
KIT TURNOVER KIT B (KITS) ×3 IMPLANT
KIT VASOVIEW HEMOPRO VH 3000 (KITS) ×3 IMPLANT
LEAD PACING MYOCARDI (MISCELLANEOUS) ×3 IMPLANT
MARKER GRAFT CORONARY BYPASS (MISCELLANEOUS) ×9 IMPLANT
NS IRRIG 1000ML POUR BTL (IV SOLUTION) ×15 IMPLANT
PACK E OPEN HEART (SUTURE) ×3 IMPLANT
PACK OPEN HEART (CUSTOM PROCEDURE TRAY) ×3 IMPLANT
PAD ARMBOARD 7.5X6 YLW CONV (MISCELLANEOUS) ×6 IMPLANT
PAD ELECT DEFIB RADIOL ZOLL (MISCELLANEOUS) ×3 IMPLANT
PENCIL BUTTON HOLSTER BLD 10FT (ELECTRODE) ×3 IMPLANT
PUNCH AORTIC ROTATE  4.5MM 8IN (MISCELLANEOUS) ×3 IMPLANT
PUNCH AORTIC ROTATE 4.0MM (MISCELLANEOUS) IMPLANT
PUNCH AORTIC ROTATE 4.5MM 8IN (MISCELLANEOUS) IMPLANT
PUNCH AORTIC ROTATE 5MM 8IN (MISCELLANEOUS) IMPLANT
SOLUTION ANTI FOG 6CC (MISCELLANEOUS) IMPLANT
SPONGE LAP 18X18 X RAY DECT (DISPOSABLE) IMPLANT
SPONGE LAP 4X18 X RAY DECT (DISPOSABLE) IMPLANT
SUT BONE WAX W31G (SUTURE) ×3 IMPLANT
SUT ETHIBOND X763 2 0 SH 1 (SUTURE) ×6 IMPLANT
SUT MNCRL AB 3-0 PS2 18 (SUTURE) ×6 IMPLANT
SUT MNCRL AB 4-0 PS2 18 (SUTURE) IMPLANT
SUT PDS AB 1 CTX 36 (SUTURE) ×6 IMPLANT
SUT PROLENE 2 0 SH DA (SUTURE) IMPLANT
SUT PROLENE 3 0 SH DA (SUTURE) ×6 IMPLANT
SUT PROLENE 3 0 SH1 36 (SUTURE) IMPLANT
SUT PROLENE 4 0 RB 1 (SUTURE) ×6
SUT PROLENE 4 0 SH DA (SUTURE) IMPLANT
SUT PROLENE 4-0 RB1 .5 CRCL 36 (SUTURE) ×4 IMPLANT
SUT PROLENE 5 0 C 1 36 (SUTURE) IMPLANT
SUT PROLENE 6 0 C 1 30 (SUTURE) IMPLANT
SUT PROLENE 7.0 RB 3 (SUTURE) ×9 IMPLANT
SUT PROLENE 8 0 BV175 6 (SUTURE) IMPLANT
SUT PROLENE BLUE 7 0 (SUTURE) ×3 IMPLANT
SUT PROLENE POLY MONO (SUTURE) IMPLANT
SUT SILK  1 MH (SUTURE) ×1
SUT SILK 1 MH (SUTURE) ×2 IMPLANT
SUT STEEL 6MS V (SUTURE) IMPLANT
SUT STEEL STERNAL CCS#1 18IN (SUTURE) IMPLANT
SUT STEEL SZ 6 DBL 3X14 BALL (SUTURE) IMPLANT
SUT VIC AB 1 CTX 36 (SUTURE)
SUT VIC AB 1 CTX36XBRD ANBCTR (SUTURE) IMPLANT
SUT VIC AB 2-0 CT1 27 (SUTURE) ×2
SUT VIC AB 2-0 CT1 TAPERPNT 27 (SUTURE) ×2 IMPLANT
SUT VIC AB 2-0 CTX 27 (SUTURE) IMPLANT
SUT VIC AB 3-0 SH 27 (SUTURE)
SUT VIC AB 3-0 SH 27X BRD (SUTURE) IMPLANT
SUT VIC AB 3-0 X1 27 (SUTURE) ×3 IMPLANT
SUT VICRYL 4-0 PS2 18IN ABS (SUTURE) IMPLANT
SYSTEM SAHARA CHEST DRAIN ATS (WOUND CARE) ×3 IMPLANT
TOWEL GREEN STERILE (TOWEL DISPOSABLE) ×3 IMPLANT
TOWEL GREEN STERILE FF (TOWEL DISPOSABLE) ×3 IMPLANT
TRAY FOLEY SILVER 16FR TEMP (SET/KITS/TRAYS/PACK) ×3 IMPLANT
TUBING INSUFFLATION (TUBING) ×3 IMPLANT
UNDERPAD 30X30 (UNDERPADS AND DIAPERS) ×3 IMPLANT
WATER STERILE IRR 1000ML POUR (IV SOLUTION) ×6 IMPLANT

## 2018-02-25 NOTE — Brief Op Note (Signed)
02/24/2018 - 02/25/2018  11:59 AM  PATIENT:  Calvin Ramirez  77 y.o. male  PRE-OPERATIVE DIAGNOSIS:  CAD LMD  POST-OPERATIVE DIAGNOSIS:  CAD LMD  PROCEDURE:  Procedure(s): CORONARY ARTERY BYPASS GRAFTING (CABG), ON PUMP, TIMES TWO, USING LEFT INTERNAL MAMMARY ARTERY AND ENDOSCOPICALLY HARVESTED RIGHT GREATER SAPHENOUS VEIN (N/A) TRANSESOPHAGEAL ECHOCARDIOGRAM (TEE) (N/A)  SURGEON:  Surgeon(s) and Role:    * Rexene Alberts, MD - Primary  PHYSICIAN ASSISTANT: WAYNE GOLD PA-C  ANESTHESIA:   general  EBL:  400 mL   BLOOD ADMINISTERED:none  DRAINS: PLEURAL AND PERICARDIAL DRAINS   LOCAL MEDICATIONS USED:  NONE  SPECIMEN:  No Specimen  DISPOSITION OF SPECIMEN:  N/A  COUNTS:  YES  TOURNIQUET:  * No tourniquets in log *  DICTATION: .Dragon Dictation  PLAN OF CARE: Admit to inpatient   PATIENT DISPOSITION:  ICU - intubated and hemodynamically stable.   Delay start of Pharmacological VTE agent (>24hrs) due to surgical blood loss or risk of bleeding: yes

## 2018-02-25 NOTE — Transfer of Care (Signed)
Immediate Anesthesia Transfer of Care Note  Patient: Calvin Ramirez  Procedure(s) Performed: CORONARY ARTERY BYPASS GRAFTING (CABG), ON PUMP, TIMES TWO, USING LEFT INTERNAL MAMMARY ARTERY AND ENDOSCOPICALLY HARVESTED RIGHT GREATER SAPHENOUS VEIN (N/A Chest) TRANSESOPHAGEAL ECHOCARDIOGRAM (TEE) (N/A )  Patient Location: SICU  Anesthesia Type:General  Level of Consciousness: sedated, unresponsive and Patient remains intubated per anesthesia plan  Airway & Oxygen Therapy: Patient remains intubated per anesthesia plan  Post-op Assessment: Report given to RN and Post -op Vital signs reviewed and stable  Post vital signs: Reviewed and stable  Last Vitals:  Vitals Value Taken Time  BP 112/58 02/25/2018 12:40 PM  Temp    Pulse 80 02/25/2018 12:44 PM  Resp 12 02/25/2018 12:44 PM  SpO2 99 % 02/25/2018 12:44 PM  Vitals shown include unvalidated device data.  Last Pain:  Vitals:   02/25/18 0558  TempSrc: Oral  PainSc:          Complications: No apparent anesthesia complications

## 2018-02-25 NOTE — Progress Notes (Signed)
  Echocardiogram Echocardiogram Transesophageal has been performed.  Jannett Celestine 02/25/2018, 9:37 AM

## 2018-02-25 NOTE — Anesthesia Procedure Notes (Signed)
Arterial Line Insertion Start/End4/01/2018 7:51 AM, 02/25/2018 8:01 AM Performed by: Eligha Bridegroom, CRNA, CRNA  Patient location: Pre-op. Preanesthetic checklist: patient identified, IV checked, site marked, risks and benefits discussed, surgical consent, monitors and equipment checked, pre-op evaluation and timeout performed Lidocaine 1% used for infiltration and patient sedated Left, radial was placed Catheter size: 20 G Hand hygiene performed , maximum sterile barriers used  and Seldinger technique used Allen's test indicative of satisfactory collateral circulation Attempts: 2 Procedure performed without using ultrasound guided technique. Following insertion, Biopatch and dressing applied. Post procedure assessment: unchanged  Patient tolerated the procedure well with no immediate complications.

## 2018-02-25 NOTE — Anesthesia Postprocedure Evaluation (Signed)
Anesthesia Post Note  Patient: Calvin Ramirez  Procedure(s) Performed: CORONARY ARTERY BYPASS GRAFTING (CABG), ON PUMP, TIMES TWO, USING LEFT INTERNAL MAMMARY ARTERY AND ENDOSCOPICALLY HARVESTED RIGHT GREATER SAPHENOUS VEIN (N/A Chest) TRANSESOPHAGEAL ECHOCARDIOGRAM (TEE) (N/A )     Patient location during evaluation: ICU Anesthesia Type: General Level of consciousness: awake Pain management: pain level controlled Vital Signs Assessment: post-procedure vital signs reviewed and stable Respiratory status: spontaneous breathing, nonlabored ventilation and respiratory function stable Cardiovascular status: stable Postop Assessment: no apparent nausea or vomiting Anesthetic complications: no    Last Vitals:  Vitals:   02/25/18 1600 02/25/18 1635  BP: 138/78   Pulse: 67   Resp: (!) 26   Temp: 36.8 C   SpO2: 98% 97%    Last Pain:  Vitals:   02/25/18 0558  TempSrc: Oral  PainSc:                  Ryan P Ellender

## 2018-02-25 NOTE — OR Nursing (Signed)
Twenty minute call to CVICU charge nurse at 1156. Spoke to [place]. Cath Lab also notified of timing.

## 2018-02-25 NOTE — Anesthesia Procedure Notes (Signed)
Procedure Name: Intubation Date/Time: 02/25/2018 8:55 AM Performed by: Eligha Bridegroom, CRNA Pre-anesthesia Checklist: Patient identified, Emergency Drugs available, Suction available, Patient being monitored and Timeout performed Patient Re-evaluated:Patient Re-evaluated prior to induction Oxygen Delivery Method: Circle system utilized Preoxygenation: Pre-oxygenation with 100% oxygen Induction Type: IV induction Ventilation: Oral airway inserted - appropriate to patient size and Two handed mask ventilation required Laryngoscope Size: Mac and 3 Grade View: Grade II Tube type: Oral Tube size: 8.0 mm Number of attempts: 1 Airway Equipment and Method: Stylet Placement Confirmation: ETT inserted through vocal cords under direct vision,  positive ETCO2,  CO2 detector and breath sounds checked- equal and bilateral Secured at: 23 cm Tube secured with: Tape Dental Injury: Teeth and Oropharynx as per pre-operative assessment  Comments: Intubation completed by Essie Hart

## 2018-02-25 NOTE — Procedures (Signed)
Extubation Procedure Note  Patient Details:   Name: SHAQUIL ALDANA DOB: 08-12-1941 MRN: [id number]   Airway Documentation:   Patient extubated per protocol. Patient's NIF and VC were within normal range. Patient had positive cuff prior to extubation. Patient is able to voice and has strong cough. Patient placed on 4lpm humidified oxygen. Instructed on incentive spirometry, patient refused to participate. RN at bedside. Will continue to monitor.  Evaluation  O2 sats: stable throughout Complications: No apparent complications Patient did tolerate procedure well. Bilateral Breath Sounds: Clear   Yes  Ander Purpura 02/25/2018, 4:32 PM

## 2018-02-25 NOTE — Anesthesia Procedure Notes (Signed)
Central Venous Catheter Insertion Performed by: Murvin Natal, MD, anesthesiologist Start/End4/01/2018 7:45 AM, 02/25/2018 8:00 AM Patient location: Pre-op. Preanesthetic checklist: patient identified, IV checked, site marked, risks and benefits discussed, surgical consent, monitors and equipment checked, pre-op evaluation, timeout performed and anesthesia consent Position: Trendelenburg Lidocaine 1% used for infiltration and patient sedated Hand hygiene performed  and maximum sterile barriers used  Catheter size: 8.5 Fr Total catheter length 10. PA cath was placed.Sheath introducer Swan type:thermodilution PA Cath depth:56 Procedure performed using ultrasound guided technique. Ultrasound Notes:anatomy identified, needle tip was noted to be adjacent to the nerve/plexus identified, no ultrasound evidence of intravascular and/or intraneural injection and image(s) printed for medical record Attempts: 1 Following insertion, line sutured, dressing applied and Biopatch. Post procedure assessment: blood return through all ports, free fluid flow and no air  Patient tolerated the procedure well with no immediate complications.

## 2018-02-25 NOTE — Progress Notes (Signed)
Patient ID: Calvin Ramirez, male   DOB: [birth year], 77 y.o.   MRN: [id number]  TCTS Evening Rounds:   Hemodynamically stable  CI = 2.8  Extubated  Urine output good  CT output low  CBC    Component Value Date/Time   WBC 11.0 (H) 02/25/2018 1237   RBC 3.69 (L) 02/25/2018 1237   HGB 10.9 (L) 02/25/2018 1820   HCT 32.0 (L) 02/25/2018 1820   PLT 144 (L) 02/25/2018 1237   MCV 89.7 02/25/2018 1237   MCH 29.5 02/25/2018 1237   MCHC 32.9 02/25/2018 1237   RDW 13.2 02/25/2018 1237   LYMPHSABS 1.3 02/24/2018 0957   MONOABS 0.4 02/24/2018 0957   EOSABS 0.2 02/24/2018 0957   BASOSABS 0.0 02/24/2018 0957     BMET    Component Value Date/Time   NA 142 02/25/2018 1820   NA 142 02/18/2018 1403   K 4.2 02/25/2018 1820   CL 106 02/25/2018 1820   CO2 26 02/25/2018 0449   GLUCOSE 133 (H) 02/25/2018 1820   BUN 16 02/25/2018 1820   BUN 25 02/18/2018 1403   CREATININE 0.90 02/25/2018 1820   CALCIUM 9.2 02/25/2018 0449   GFRNONAA >60 02/25/2018 0449   GFRAA >60 02/25/2018 0449     A/P:  Stable postop course. Continue current plans

## 2018-02-25 NOTE — OR Nursing (Signed)
11:25am - 45 minute call to SICU charge nurse 

## 2018-02-25 NOTE — Progress Notes (Signed)
      [address]       [place] [address]             [phone]     CARDIOTHORACIC SURGERY PROGRESS NOTE  Subjective: Calvin Ramirez has been scheduled for Procedure(s): RIGHT/LEFT HEART CATH AND CORONARY ANGIOGRAPHY (N/A) today.   Objective: Vital signs in last 24 hours: Temp:  [97.9 F (36.6 C)-98.4 F (36.9 C)] 98.2 F (36.8 C) (04/03 0558) Pulse Rate:  [0-69] 59 (04/03 0558) Cardiac Rhythm: Heart block (04/02 1900) Resp:  [0-20] 12 (04/02 1405) BP: (114-151)/(51-91) 121/65 (04/03 0558) SpO2:  [0 %-100 %] 98 % (04/03 0558) Weight:  [204 lb (92.5 kg)-209 lb 3.2 oz (94.9 kg)] 209 lb 3.2 oz (94.9 kg) (04/03 0604)  Physical Exam: Unchanged from previously   Intake/Output from previous day: 04/02 0701 - 04/03 0700 In: 981.3 [P.O.:900; I.V.:81.3] Out: 650 [Urine:650] Intake/Output this shift: Total I/O In: 618.3 [P.O.:540; I.V.:78.3] Out: 550 [Urine:550]  Lab Results: Recent Labs    02/24/18 0957 02/25/18 0449  WBC 5.9 7.4  HGB 14.5 13.4  HCT 42.5 41.1  PLT 223 214   BMET:  Recent Labs    02/24/18 1655 02/25/18 0449  NA 140 140  K 3.5 4.2  CL 106 103  CO2 24 26  GLUCOSE 125* 105*  BUN 22* 22*  CREATININE 1.06 1.11  CALCIUM 8.9 9.2    CBG (last 3)  Recent Labs    02/25/18 0557  GLUCAP 103*   PT/INR:  No results for input(s): LABPROT, INR in the last 72 hours.  Assessment/Plan:   The various methods of treatment have been discussed with the patient. After consideration of the risks, benefits and treatment options the patient has consented to the planned procedure.   The patient has been seen and labs reviewed. There are no changes in the patient's condition to prevent proceeding with the planned procedure today.   Rexene Alberts, MD 02/25/2018 6:54 AM

## 2018-02-25 NOTE — Progress Notes (Signed)
Patient was extubated at 35.  Tolerated well, mild confusion noted, reorienting.  Informed by wife that patient was experiencing this prior to surgery.  Monitoring.

## 2018-02-25 NOTE — Op Note (Signed)
CARDIOTHORACIC SURGERY OPERATIVE NOTE  Date of Procedure: 02/25/2018  Preoperative Diagnosis: Severe Left Coronary Artery Disease  Postoperative Diagnosis: Same  Procedure:    Coronary Artery Bypass Grafting x 2   Left Internal Mammary Artery to Distal Left Anterior Descending Coronary Artery  Saphenous Vein Graft to Second Obtuse Marginal Branch of Left Circumflex Coronary Artery  Endoscopic Vein Harvest from Right Thigh   Surgeon: Valentina Gu. Roxy Manns, MD  Assistant: John Giovanni, PA-C  Anesthesia: Adele Barthel, MD  Operative Findings:  Normal left ventricular systolic function  Good quality left internal mammary artery conduit  Good quality saphenous vein conduit  Good quality target vessels for grafting    BRIEF CLINICAL NOTE AND INDICATIONS FOR SURGERY  Patient is a 77 year old male with history of coronary artery disease, hypertension, hyperlipidemia, and mild Parkinson's disease who has been referred for surgical consultation to discuss treatment options for management of critical left main coronary artery disease.  The patient's cardiac history dates back to 2011 when he presented with symptoms of angina pectoris.  He underwent cardiac catheterization and was treated with PCI and stenting including drug-eluting stents in the proximal left anterior descending coronary artery, the distal right coronary artery, and the posterolateral branch of the right coronary artery.  He did well clinically and has been followed regularly ever since by Dr. Tamala Julian.  He was last seen in follow-up in October 2018 at which time he was doing well.  Patient was recently traveling in [place] for a wedding.  He was under considerable stress at the time.  He was awoken from his sleep in the early morning of February 13, 2018 with severe resting shortness of breath.  He was found to be hypoxic and taken directly to the emergency room at [facility].  Symptoms improved  quickly.  Echocardiogram revealed normal left ventricular systolic function with ejection fraction estimated 55-60%.  There was restrictive left ventricular filling felt secondary to severe diastolic dysfunction.  There was severely dilated left atrium with moderate mitral regurgitation.  The patient was seen by a cardiologist in treated with IV diuretics and did remarkably well.  Troponin levels were slightly elevated but remained flat.  BNP level was 414.  The patient was advised to have a stress test and/or cardiac catheterization, but the patient insisted on returning to [facility] for further workup.  He was brought in for elective cardiac catheterization by Dr. Tamala Julian earlier today.  Catheterization revealed critical left main coronary artery disease with preserved left ventricular function.  Cardiothoracic surgical consultation was requested.  The patient has been seen in consultation and counseled at length regarding the indications, risks and potential benefits of surgery.  All questions have been answered, and the patient provides full informed consent for the operation as described.   DETAILS OF THE OPERATIVE PROCEDURE  Preparation:  The patient is brought to the operating room on the above mentioned date and central monitoring was established by the anesthesia team including placement of Swan-Ganz catheter and radial arterial line. The patient is placed in the supine position on the operating table.  Intravenous antibiotics are administered. General endotracheal anesthesia is induced uneventfully. A Foley catheter is placed.  Baseline transesophageal echocardiogram was performed.  Findings were notable for normal LV systolic function.  There was mild mitral regurgitation and trace central aortic insufficiency.  The patient's chest, abdomen, both groins, and both lower extremities are prepared and draped in a sterile manner. A time out procedure is performed.  Surgical Approach and Conduit  Harvest:  A median sternotomy incision was performed and the left internal mammary artery is dissected from the chest wall and prepared for bypass grafting. The left internal mammary artery is notably good quality conduit. Simultaneously, the greater saphenous vein is obtained from the patient's right thigh using endoscopic vein harvest technique. The saphenous vein is notably good quality conduit. After removal of the saphenous vein, the small surgical incisions in the lower extremity are closed with absorbable suture. Following systemic heparinization, the left internal mammary artery was transected distally noted to have excellent flow.   Extracorporeal Cardiopulmonary Bypass and Myocardial Protection:  The pericardium is opened. The ascending aorta is normal in appearance. The ascending aorta and the right atrium are cannulated for cardiopulmonary bypass.  Adequate heparinization is verified.     The entire pre-bypass portion of the operation was notable for stable hemodynamics.  Cardiopulmonary bypass was begun and the surface of the heart is inspected. Distal target vessels are selected for coronary artery bypass grafting. A cardioplegia cannula is placed in the ascending aorta.  A temperature probe was placed in the interventricular septum.  The patient is allowed to cool passively to [facility] systemic temperature.  The aortic cross clamp is applied and cold blood cardioplegia is delivered initially in an antegrade fashion through the aortic root.   Iced saline slush is applied for topical hypothermia.  The initial cardioplegic arrest is rapid with early diastolic arrest.  Repeat doses of cardioplegia are administered intermittently throughout the entire cross clamp portion of the operation through the aortic root and through subsequently placed vein grafts in order to maintain completely flat electrocardiogram and septal myocardial temperature below 15C.  Myocardial protection was felt to be  excellent.  Coronary Artery Bypass Grafting:   The second obtuse marginal branch of the left circumflex coronary artery was grafted using a reversed saphenous vein graft in an end-to-side fashion.  At the site of distal anastomosis the target vessel was good quality and measured approximately 1.8 mm in diameter.  The distal left anterior coronary artery was grafted with the left internal mammary artery in an end-to-side fashion.  At the site of distal anastomosis the target vessel was good quality and measured approximately 2.2 mm in diameter.  The single proximal vein graft anastomosis was placed directly to the ascending aorta prior to removal of the aortic cross clamp.  The septal myocardial temperature rose rapidly after reperfusion of the left internal mammary artery graft.  The aortic cross clamp was removed after a total cross clamp time of 36 minutes.   Procedure Completion:  All proximal and distal coronary anastomoses were inspected for hemostasis and appropriate graft orientation. Epicardial pacing wires are fixed to the right ventricular outflow tract and to the right atrial appendage. The patient is rewarmed to 37C temperature. The patient is weaned and disconnected from cardiopulmonary bypass.  The patient's rhythm at separation from bypass was AV paced.  The patient was weaned from cardiopulmonary bypass without any inotropic support. Total cardiopulmonary bypass time for the operation was 51 minutes.  Followup transesophageal echocardiogram performed after separation from bypass revealed no changes from the preoperative exam.  The aortic and venous cannula were removed uneventfully. Protamine was administered to reverse the anticoagulation. The mediastinum and pleural space were inspected for hemostasis and irrigated with saline solution. The mediastinum and the left pleural space were drained using 3 chest tubes placed through separate stab incisions inferiorly.  The soft tissues  anterior to the  aorta were reapproximated loosely. The sternum is closed with double strength sternal wire. The soft tissues anterior to the sternum were closed in multiple layers and the skin is closed with a running subcuticular skin closure.  The post-bypass portion of the operation was notable for stable rhythm and hemodynamics.  No blood products were administered during the operation.   Disposition:  The patient tolerated the procedure well and is transported to the surgical intensive care in stable condition. There are no intraoperative complications. All sponge instrument and needle counts are verified correct at completion of the operation.    Valentina Gu. Roxy Manns MD 02/25/2018 12:02 PM

## 2018-02-25 NOTE — Progress Notes (Signed)
Rapid wean protocol initiated 1540

## 2018-02-26 ENCOUNTER — Inpatient Hospital Stay (HOSPITAL_COMMUNITY): Payer: Medicare Other

## 2018-02-26 ENCOUNTER — Encounter (HOSPITAL_COMMUNITY): Payer: Self-pay | Admitting: Thoracic Surgery (Cardiothoracic Vascular Surgery)

## 2018-02-26 DIAGNOSIS — Z951 Presence of aortocoronary bypass graft: Secondary | ICD-10-CM

## 2018-02-26 LAB — POCT I-STAT, CHEM 8
BUN: 21 mg/dL — ABNORMAL HIGH (ref 6–20)
BUN: 18 mg/dL (ref 6–20)
BUN: 21 mg/dL — ABNORMAL HIGH (ref 6–20)
BUN: 19 mg/dL (ref 6–20)
BUN: 19 mg/dL (ref 6–20)
BUN: 23 mg/dL — ABNORMAL HIGH (ref 6–20)
Calcium, Ion: 1.25 mmol/L (ref 1.15–1.40)
Calcium, Ion: 1.05 mmol/L — ABNORMAL LOW (ref 1.15–1.40)
Calcium, Ion: 1.26 mmol/L (ref 1.15–1.40)
Calcium, Ion: 1.11 mmol/L — ABNORMAL LOW (ref 1.15–1.40)
Calcium, Ion: 1.14 mmol/L — ABNORMAL LOW (ref 1.15–1.40)
Calcium, Ion: 1.27 mmol/L (ref 1.15–1.40)
Chloride: 102 mmol/L (ref 101–111)
Chloride: 103 mmol/L (ref 101–111)
Chloride: 104 mmol/L (ref 101–111)
Chloride: 104 mmol/L (ref 101–111)
Chloride: 102 mmol/L (ref 101–111)
Chloride: 101 mmol/L (ref 101–111)
Creatinine, Ser: 0.8 mg/dL (ref 0.61–1.24)
Creatinine, Ser: 0.7 mg/dL (ref 0.61–1.24)
Creatinine, Ser: 0.9 mg/dL (ref 0.61–1.24)
Creatinine, Ser: 0.8 mg/dL (ref 0.61–1.24)
Creatinine, Ser: 0.8 mg/dL (ref 0.61–1.24)
Creatinine, Ser: 1.1 mg/dL (ref 0.61–1.24)
Glucose, Bld: 104 mg/dL — ABNORMAL HIGH (ref 65–99)
Glucose, Bld: 108 mg/dL — ABNORMAL HIGH (ref 65–99)
Glucose, Bld: 98 mg/dL (ref 65–99)
Glucose, Bld: 116 mg/dL — ABNORMAL HIGH (ref 65–99)
Glucose, Bld: 118 mg/dL — ABNORMAL HIGH (ref 65–99)
Glucose, Bld: 141 mg/dL — ABNORMAL HIGH (ref 65–99)
HCT: 36 % — ABNORMAL LOW (ref 39.0–52.0)
HCT: 30 % — ABNORMAL LOW (ref 39.0–52.0)
HCT: 33 % — ABNORMAL LOW (ref 39.0–52.0)
HCT: 30 % — ABNORMAL LOW (ref 39.0–52.0)
HCT: 30 % — ABNORMAL LOW (ref 39.0–52.0)
HCT: 31 % — ABNORMAL LOW (ref 39.0–52.0)
Hemoglobin: 12.2 g/dL — ABNORMAL LOW (ref 13.0–17.0)
Hemoglobin: 10.2 g/dL — ABNORMAL LOW (ref 13.0–17.0)
Hemoglobin: 11.2 g/dL — ABNORMAL LOW (ref 13.0–17.0)
Hemoglobin: 10.2 g/dL — ABNORMAL LOW (ref 13.0–17.0)
Hemoglobin: 10.2 g/dL — ABNORMAL LOW (ref 13.0–17.0)
Hemoglobin: 10.5 g/dL — ABNORMAL LOW (ref 13.0–17.0)
Potassium: 4 mmol/L (ref 3.5–5.1)
Potassium: 3.8 mmol/L (ref 3.5–5.1)
Potassium: 4.1 mmol/L (ref 3.5–5.1)
Potassium: 4.8 mmol/L (ref 3.5–5.1)
Potassium: 4.2 mmol/L (ref 3.5–5.1)
Potassium: 4.2 mmol/L (ref 3.5–5.1)
Sodium: 141 mmol/L (ref 135–145)
Sodium: 142 mmol/L (ref 135–145)
Sodium: 143 mmol/L (ref 135–145)
Sodium: 141 mmol/L (ref 135–145)
Sodium: 141 mmol/L (ref 135–145)
Sodium: 138 mmol/L (ref 135–145)
TCO2: 30 mmol/L (ref 22–32)
TCO2: 27 mmol/L (ref 22–32)
TCO2: 31 mmol/L (ref 22–32)
TCO2: 29 mmol/L (ref 22–32)
TCO2: 26 mmol/L (ref 22–32)
TCO2: 24 mmol/L (ref 22–32)

## 2018-02-26 LAB — CREATININE, SERUM
Creatinine, Ser: 1.23 mg/dL (ref 0.61–1.24)
GFR calc Af Amer: >60 mL/min (ref >60)
GFR calc non Af Amer: 55 mL/min — ABNORMAL LOW (ref >60)

## 2018-02-26 LAB — MAGNESIUM
Magnesium: 1.9 mg/dL (ref 1.7–2.4)
Magnesium: 2.2 mg/dL (ref 1.7–2.4)

## 2018-02-26 LAB — BLOOD GAS, ARTERIAL
Acid-base deficit: 0.7 mmol/L (ref 0.0–2.0)
Bicarbonate: 23.6 mmol/L (ref 20.0–28.0)
Drawn by: 511331
O2 Content: 4 L/min
O2 Saturation: 92.3 %
Patient temperature: 100.4
pCO2 arterial: 41.4 mmHg (ref 32.0–48.0)
pH, Arterial: 7.379 (ref 7.350–7.450)
pO2, Arterial: 66.8 mmHg — ABNORMAL LOW (ref 83.0–108.0)

## 2018-02-26 LAB — GLUCOSE, CAPILLARY
Glucose-Capillary: 107 mg/dL — ABNORMAL HIGH (ref 65–99)
Glucose-Capillary: 112 mg/dL — ABNORMAL HIGH (ref 65–99)
Glucose-Capillary: 112 mg/dL — ABNORMAL HIGH (ref 65–99)
Glucose-Capillary: 115 mg/dL — ABNORMAL HIGH (ref 65–99)
Glucose-Capillary: 124 mg/dL — ABNORMAL HIGH (ref 65–99)
Glucose-Capillary: 128 mg/dL — ABNORMAL HIGH (ref 65–99)
Glucose-Capillary: 133 mg/dL — ABNORMAL HIGH (ref 65–99)
Glucose-Capillary: 158 mg/dL — ABNORMAL HIGH (ref 65–99)
Glucose-Capillary: 225 mg/dL — ABNORMAL HIGH (ref 65–99)
Glucose-Capillary: 89 mg/dL (ref 65–99)

## 2018-02-26 LAB — CBC
HCT: 28.4 % — ABNORMAL LOW (ref 39.0–52.0)
HCT: 31.5 % — ABNORMAL LOW (ref 39.0–52.0)
Hemoglobin: 9.5 g/dL — ABNORMAL LOW (ref 13.0–17.0)
Hemoglobin: 10.5 g/dL — ABNORMAL LOW (ref 13.0–17.0)
MCH: 30.7 pg (ref 26.0–34.0)
MCH: 30.8 pg (ref 26.0–34.0)
MCHC: 33.5 g/dL (ref 30.0–36.0)
MCHC: 33.3 g/dL (ref 30.0–36.0)
MCV: 91.9 fL (ref 78.0–100.0)
MCV: 92.4 fL (ref 78.0–100.0)
Platelets: 134 10*3/uL — ABNORMAL LOW (ref 150–400)
Platelets: 149 10*3/uL — ABNORMAL LOW (ref 150–400)
RBC: 3.09 MIL/uL — ABNORMAL LOW (ref 4.22–5.81)
RBC: 3.41 MIL/uL — ABNORMAL LOW (ref 4.22–5.81)
RDW: 13.7 % (ref 11.5–15.5)
RDW: 13.8 % (ref 11.5–15.5)
WBC: 9.8 10*3/uL (ref 4.0–10.5)
WBC: 12.2 10*3/uL — ABNORMAL HIGH (ref 4.0–10.5)

## 2018-02-26 LAB — POCT I-STAT 3, ART BLOOD GAS (G3+)
Acid-Base Excess: 1 mmol/L (ref 0.0–2.0)
Acid-Base Excess: 5 mmol/L — ABNORMAL HIGH (ref 0.0–2.0)
Acid-Base Excess: 2 mmol/L (ref 0.0–2.0)
Bicarbonate: 27.1 mmol/L (ref 20.0–28.0)
Bicarbonate: 28.9 mmol/L — ABNORMAL HIGH (ref 20.0–28.0)
Bicarbonate: 26.9 mmol/L (ref 20.0–28.0)
O2 Saturation: 100 %
O2 Saturation: 100 %
O2 Saturation: 100 %
TCO2: 29 mmol/L (ref 22–32)
TCO2: 30 mmol/L (ref 22–32)
TCO2: 28 mmol/L (ref 22–32)
pCO2 arterial: 48.2 mmHg — ABNORMAL HIGH (ref 32.0–48.0)
pCO2 arterial: 40.2 mmHg (ref 32.0–48.0)
pCO2 arterial: 41.3 mmHg (ref 32.0–48.0)
pH, Arterial: 7.358 (ref 7.350–7.450)
pH, Arterial: 7.465 — ABNORMAL HIGH (ref 7.350–7.450)
pH, Arterial: 7.422 (ref 7.350–7.450)
pO2, Arterial: 523 mmHg — ABNORMAL HIGH (ref 83.0–108.0)
pO2, Arterial: 428 mmHg — ABNORMAL HIGH (ref 83.0–108.0)
pO2, Arterial: 175 mmHg — ABNORMAL HIGH (ref 83.0–108.0)

## 2018-02-26 LAB — BASIC METABOLIC PANEL
Anion gap: 6 (ref 5–15)
BUN: 15 mg/dL (ref 6–20)
CO2: 23 mmol/L (ref 22–32)
Calcium: 7.6 mg/dL — ABNORMAL LOW (ref 8.9–10.3)
Chloride: 103 mmol/L (ref 101–111)
Creatinine, Ser: 0.99 mg/dL (ref 0.61–1.24)
GFR calc Af Amer: >60 mL/min (ref >60)
GFR calc non Af Amer: >60 mL/min (ref >60)
Glucose, Bld: 140 mg/dL — ABNORMAL HIGH (ref 65–99)
Potassium: 4.1 mmol/L (ref 3.5–5.1)
Sodium: 132 mmol/L — ABNORMAL LOW (ref 135–145)

## 2018-02-26 MED ORDER — INSULIN ASPART 100 UNIT/ML ~~LOC~~ SOLN
0.0000 [IU] | SUBCUTANEOUS | Status: DC
  Administered 2018-02-26: 2 [IU] via SUBCUTANEOUS

## 2018-02-26 MED ORDER — INSULIN ASPART 100 UNIT/ML ~~LOC~~ SOLN
0.0000 [IU] | SUBCUTANEOUS | Status: DC
  Administered 2018-02-26 – 2018-02-27 (×4): 2 [IU] via SUBCUTANEOUS

## 2018-02-26 MED ORDER — ENOXAPARIN SODIUM 40 MG/0.4ML ~~LOC~~ SOLN
40.0000 mg | Freq: Every day | SUBCUTANEOUS | Status: DC
  Administered 2018-02-26: 40 mg via SUBCUTANEOUS
  Filled 2018-02-26: qty 0.4

## 2018-02-26 MED ORDER — MAGNESIUM OXIDE 400 (241.3 MG) MG PO TABS
400.0000 mg | ORAL_TABLET | Freq: Every day | ORAL | Status: DC
  Administered 2018-02-26 – 2018-03-04 (×7): 400 mg via ORAL
  Filled 2018-02-26 (×7): qty 1

## 2018-02-26 MED FILL — Lidocaine HCl Local Inj 1%: INTRAMUSCULAR | Qty: 20 | Status: AC

## 2018-02-26 NOTE — Care Management Note (Signed)
Case Management Note Marvetta Gibbons RN, BSN Unit 4E-Case Manager-- [place] coverage [phone]  Patient Details  Name: Calvin Ramirez MRN: [id number] Date of Birth: September 02, 1941  Subjective/Objective:   Pt admitted s/p CABGx2                 Action/Plan: PTA pt lived at home with wife- CM to follow for transition of care needs.   Expected Discharge Date:                  Expected Discharge Plan:  Home/Self Care  In-House Referral:     Discharge planning Services  CM Consult  Post Acute Care Choice:    Choice offered to:     DME Arranged:    DME Agency:     HH Arranged:    HH Agency:     Status of Service:  In process, will continue to follow  If discussed at Long Length of Stay Meetings, dates discussed:    Discharge Disposition:   Additional Comments:  Dawayne Patricia, RN 02/26/2018, 2:53 PM

## 2018-02-26 NOTE — Progress Notes (Signed)
TCTS BRIEF SICU PROGRESS NOTE  1 Day Post-Op  S/P Procedure(s) (LRB): CORONARY ARTERY BYPASS GRAFTING (CABG), ON PUMP, TIMES TWO, USING LEFT INTERNAL MAMMARY ARTERY AND ENDOSCOPICALLY HARVESTED RIGHT GREATER SAPHENOUS VEIN (N/A) TRANSESOPHAGEAL ECHOCARDIOGRAM (TEE) (N/A)   Excellent day Much less confused NSR w/ stable BP Breathing comfortably w/ O2 sats 97% on room air  Plan: Continue routine care  Rexene Alberts, MD 02/26/2018 7:44 PM

## 2018-02-26 NOTE — Progress Notes (Addendum)
T[facility] DAILY ICU PROGRESS NOTE                   [address]            [place] [address]          [phone]   1 Day Post-Op Procedure(s) (LRB): CORONARY ARTERY BYPASS GRAFTING (CABG), ON PUMP, TIMES TWO, USING LEFT INTERNAL MAMMARY ARTERY AND ENDOSCOPICALLY HARVESTED RIGHT GREATER SAPHENOUS VEIN (N/A) TRANSESOPHAGEAL ECHOCARDIOGRAM (TEE) (N/A)  Total Length of Stay:  LOS: 2 days   Subjective: Confused. Oriented x 2 , re-orients fairly easily, some jumbled words  Objective: Vital signs in last 24 hours: Temp:  [96.4 F (35.8 C)-100.4 F (38 C)] 99.7 F (37.6 C) (04/04 0700) Pulse Rate:  [60-88] 70 (04/04 0700) Cardiac Rhythm: Atrial paced (04/04 0400) Resp:  [12-30] 26 (04/04 0700) BP: (90-156)/(58-93) 101/65 (04/04 0700) SpO2:  [92 %-100 %] 95 % (04/04 0700) Arterial Line BP: (76-193)/(47-77) 163/66 (04/04 0000) FiO2 (%):  [36 %-50 %] 36 % (04/03 1635)  Filed Weights   02/24/18 0956 02/25/18 0604  Weight: 204 lb (92.5 kg) 209 lb 3.2 oz (94.9 kg)    Weight change:    Hemodynamic parameters for last 24 hours: PAP: (17-41)/(5-28) 24/10 CO:  [2.4 L/min-7.9 L/min] 7.9 L/min CI:  [1.4 L/min/m2-3.7 L/min/m2] 3.7 L/min/m2  Intake/Output from previous day: 04/03 0701 - 04/04 0700 In: 5619.6 [I.V.:4369.6; Blood:200; IV Piggyback:1050] Out: 9147 [Urine:3910; Blood:400; Chest Tube:431]  Intake/Output this shift: No intake/output data recorded.  Current Meds: Scheduled Meds: . acetaminophen  1,000 mg Oral Q6H  . aspirin EC  325 mg Oral Daily  . bisacodyl  10 mg Oral Daily   Or  . bisacodyl  10 mg Rectal Daily  . chlorhexidine gluconate (MEDLINE KIT)  15 mL Mouth Rinse BID  . Chlorhexidine Gluconate Cloth  6 each Topical Daily  . docusate sodium  200 mg Oral Daily  . insulin aspart  0-24 Units Subcutaneous Q4H  . mouth rinse  15 mL Mouth Rinse QID  . metoprolol tartrate  12.5 mg Oral BID  . pantoprazole  40 mg Oral Daily  . sodium chloride flush   10-40 mL Intracatheter Q12H  . sodium chloride flush  3 mL Intravenous Q12H   Continuous Infusions: . sodium chloride    . albumin human    .  ceFAZolin (ANCEF) IV Stopped (02/26/18 0050)  . lactated ringers    . lactated ringers 10 mL/hr at 02/26/18 0500   PRN Meds:.albumin human, metoprolol tartrate, morphine injection, ondansetron (ZOFRAN) IV, oxyCODONE, sodium chloride flush, sodium chloride flush, traMADol  General appearance: alert, cooperative, distracted and no distress Heart: regular rate and rhythm Lungs: clear anteriorly  Abdomen: soft, non-tender, + BS Extremities: no LE edema Wound: dressings CDI  Lab Results: CBC: Recent Labs    02/25/18 1812 02/25/18 1820 02/26/18 0344  WBC 14.6*  --  9.8  HGB 11.1* 10.9* 9.5*  HCT 32.8* 32.0* 28.4*  PLT 188  --  134*   BMET:  Recent Labs    02/25/18 0449  02/25/18 1820 02/26/18 0344  NA 140   < > 142 132*  K 4.2   < > 4.2 4.1  CL 103  --  106 103  CO2 26  --   --  23  GLUCOSE 105*   < > 133* 140*  BUN 22*  --  16 15  CREATININE 1.11   < > 0.90 0.99  CALCIUM 9.2  --   --  7.6*   < > = values in this interval not displayed.    CMET: Lab Results  Component Value Date   WBC 9.8 02/26/2018   HGB 9.5 (L) 02/26/2018   HCT 28.4 (L) 02/26/2018   PLT 134 (L) 02/26/2018   GLUCOSE 140 (H) 02/26/2018   CHOL 160 11/21/2017   TRIG 208 (H) 11/21/2017   HDL 43 11/21/2017   LDLDIRECT 98.0 04/21/2015   LDLCALC 75 11/21/2017   ALT 20 02/24/2018   AST 24 02/24/2018   NA 132 (L) 02/26/2018   K 4.1 02/26/2018   CL 103 02/26/2018   CREATININE 0.99 02/26/2018   BUN 15 02/26/2018   CO2 23 02/26/2018   INR 1.40 02/25/2018   HGBA1C 5.5 02/24/2018      PT/INR:  Recent Labs    02/25/18 1237  LABPROT 17.0*  INR 1.40   Radiology: Dg Chest Port 1 View  Result Date: 02/25/2018 CLINICAL DATA:  Atelectasis EXAM: PORTABLE CHEST 1 VIEW COMPARISON:  Yesterday FINDINGS: Endotracheal tube tip at the clavicular heads. Swan-Ganz  catheter via right IJ approach with tip at the pulmonary artery outflow tract. Thoracic drains. Lower lung atelectasis, mainly on the right. No Kerley lines, effusion, or pneumothorax. Interval changes of CABG. Stable heart size. IMPRESSION: 1. Mild atelectasis.  No visible pneumothorax. 2. Unremarkable tubes and central line. Electronically Signed   By: Monte Fantasia M.D.   On: 02/25/2018 14:21     Assessment/Plan: S/P Procedure(s) (LRB): CORONARY ARTERY BYPASS GRAFTING (CABG), ON PUMP, TIMES TWO, USING LEFT INTERNAL MAMMARY ARTERY AND ENDOSCOPICALLY HARVESTED RIGHT GREATER SAPHENOUS VEIN (N/A) TRANSESOPHAGEAL ECHOCARDIOGRAM (TEE) (N/A)  1 hemodyn stable with excellent cardiac index, normal PAP, BP- on no inotropic support. Some vent ectopy, sinus brady under pacer(59), replace magnesium, K+ok at 4.1  2 excellent UOP, wt up about 5 pounds- allow to cont spont diuresis for now but may need some diuretic 3 CT drainage 360 yesterday post-op, 70 cc today- remove tubes a little later today 4 tmax 100.4, no leukocytosis- follow clinically 5 renal fxn normal 6 min hyponatremiia 7 blood sugars adeq controlled, A1C 5.5 8 confusion - baseline dementia/parkinsons is exacerbated by surgery/ICU delirium- will have to go slow with diet to avoid aspiration 9 d/c lines, leave foley for now  John Giovanni 02/26/2018 7:25 AM   I have seen and examined the patient and agree with the assessment and plan as outlined.  Doing very well other than mild confusion and delirium, which is not unexpected.  Mobilize.  D/C tubes and lines.  Minimize narcotics.  Rexene Alberts, MD 02/26/2018 8:26 AM

## 2018-02-27 ENCOUNTER — Inpatient Hospital Stay (HOSPITAL_COMMUNITY): Payer: Medicare Other

## 2018-02-27 LAB — BASIC METABOLIC PANEL
Anion gap: 11 (ref 5–15)
BUN: 21 mg/dL — ABNORMAL HIGH (ref 6–20)
CO2: 24 mmol/L (ref 22–32)
Calcium: 8.9 mg/dL (ref 8.9–10.3)
Chloride: 100 mmol/L — ABNORMAL LOW (ref 101–111)
Creatinine, Ser: 1.16 mg/dL (ref 0.61–1.24)
GFR calc Af Amer: >60 mL/min (ref >60)
GFR calc non Af Amer: 59 mL/min — ABNORMAL LOW (ref >60)
Glucose, Bld: 135 mg/dL — ABNORMAL HIGH (ref 65–99)
Potassium: 4.3 mmol/L (ref 3.5–5.1)
Sodium: 135 mmol/L (ref 135–145)

## 2018-02-27 LAB — CBC
HCT: 32.4 % — ABNORMAL LOW (ref 39.0–52.0)
Hemoglobin: 10.4 g/dL — ABNORMAL LOW (ref 13.0–17.0)
MCH: 29.3 pg (ref 26.0–34.0)
MCHC: 32.1 g/dL (ref 30.0–36.0)
MCV: 91.3 fL (ref 78.0–100.0)
Platelets: 166 10*3/uL (ref 150–400)
RBC: 3.55 MIL/uL — ABNORMAL LOW (ref 4.22–5.81)
RDW: 13.4 % (ref 11.5–15.5)
WBC: 13.5 10*3/uL — ABNORMAL HIGH (ref 4.0–10.5)

## 2018-02-27 LAB — GLUCOSE, CAPILLARY
Glucose-Capillary: 120 mg/dL — ABNORMAL HIGH (ref 65–99)
Glucose-Capillary: 129 mg/dL — ABNORMAL HIGH (ref 65–99)
Glucose-Capillary: 141 mg/dL — ABNORMAL HIGH (ref 65–99)
Glucose-Capillary: 146 mg/dL — ABNORMAL HIGH (ref 65–99)

## 2018-02-27 MED ORDER — MOVING RIGHT ALONG BOOK
Freq: Once | Status: DC
  Filled 2018-02-27: qty 1

## 2018-02-27 MED ORDER — ROSUVASTATIN CALCIUM 10 MG PO TABS
40.0000 mg | ORAL_TABLET | Freq: Every day | ORAL | Status: DC
  Administered 2018-02-27 – 2018-03-02 (×4): 40 mg via ORAL
  Filled 2018-02-27 (×4): qty 4

## 2018-02-27 MED ORDER — POTASSIUM CHLORIDE CRYS ER 20 MEQ PO TBCR
20.0000 meq | EXTENDED_RELEASE_TABLET | Freq: Every day | ORAL | Status: AC
  Administered 2018-02-27 – 2018-03-01 (×3): 20 meq via ORAL
  Filled 2018-02-27 (×3): qty 1

## 2018-02-27 MED ORDER — ENOXAPARIN SODIUM 30 MG/0.3ML ~~LOC~~ SOLN
30.0000 mg | SUBCUTANEOUS | Status: DC
  Administered 2018-02-28 – 2018-03-03 (×3): 30 mg via SUBCUTANEOUS
  Filled 2018-02-27 (×5): qty 0.3

## 2018-02-27 MED ORDER — FUROSEMIDE 40 MG PO TABS
40.0000 mg | ORAL_TABLET | Freq: Every day | ORAL | Status: AC
  Administered 2018-02-27 – 2018-03-01 (×3): 40 mg via ORAL
  Filled 2018-02-27 (×3): qty 1

## 2018-02-27 MED ORDER — LISINOPRIL 5 MG PO TABS
5.0000 mg | ORAL_TABLET | Freq: Every day | ORAL | Status: DC
  Administered 2018-02-27 – 2018-03-01 (×3): 5 mg via ORAL
  Filled 2018-02-27 (×3): qty 1

## 2018-02-27 MED FILL — Heparin Sodium (Porcine) Inj 1000 Unit/ML: INTRAMUSCULAR | Qty: 10 | Status: AC

## 2018-02-27 MED FILL — Electrolyte-R (PH 7.4) Solution: INTRAVENOUS | Qty: 3000 | Status: AC

## 2018-02-27 MED FILL — Mannitol IV Soln 20%: INTRAVENOUS | Qty: 500 | Status: AC

## 2018-02-27 MED FILL — Sodium Chloride IV Soln 0.9%: INTRAVENOUS | Qty: 2000 | Status: AC

## 2018-02-27 MED FILL — Heparin Sodium (Porcine) Inj 1000 Unit/ML: INTRAMUSCULAR | Qty: 30 | Status: AC

## 2018-02-27 MED FILL — Magnesium Sulfate Inj 50%: INTRAMUSCULAR | Qty: 10 | Status: AC

## 2018-02-27 MED FILL — Lidocaine HCl IV Inj 20 MG/ML: INTRAVENOUS | Qty: 5 | Status: AC

## 2018-02-27 MED FILL — Potassium Chloride Inj 2 mEq/ML: INTRAVENOUS | Qty: 40 | Status: AC

## 2018-02-27 MED FILL — Sodium Bicarbonate IV Soln 8.4%: INTRAVENOUS | Qty: 50 | Status: AC

## 2018-02-27 NOTE — Progress Notes (Signed)
Pt arrived to 4e from 2h. Pt and pt's wife oriented to room and staff. Vitals obtained. Telemetry applied and CCMD notified x2. Pt resting in bed at this time; bed alarm is on. Pt denies needs. Will continue current plan of care.   Ara Kussmaul BSN, RN

## 2018-02-27 NOTE — Discharge Instructions (Signed)

## 2018-02-27 NOTE — Progress Notes (Addendum)
T[facility] DAILY ICU PROGRESS NOTE                   [address]            [place] [address]          [phone]   2 Days Post-Op Procedure(s) (LRB): CORONARY ARTERY BYPASS GRAFTING (CABG), ON PUMP, TIMES TWO, USING LEFT INTERNAL MAMMARY ARTERY AND ENDOSCOPICALLY HARVESTED RIGHT GREATER SAPHENOUS VEIN (N/A) TRANSESOPHAGEAL ECHOCARDIOGRAM (TEE) (N/A)  Total Length of Stay:  LOS: 3 days   Subjective: Much more alert and oriented today. C/O some sternal incis discomfort   Objective: Vital signs in last 24 hours: Temp:  [98.2 F (36.8 C)-98.4 F (36.9 C)] 98.2 F (36.8 C) (04/05 0400) Pulse Rate:  [54-82] 75 (04/05 0700) Cardiac Rhythm: Normal sinus rhythm (04/05 0400) Resp:  [14-39] 34 (04/05 0700) BP: (90-175)/(56-122) 110/89 (04/05 0700) SpO2:  [84 %-99 %] 91 % (04/05 0700) Weight:  [221 lb 9.6 oz (100.5 kg)] 221 lb 9.6 oz (100.5 kg) (04/05 0500)  Filed Weights   02/24/18 0956 02/25/18 0604 02/27/18 0500  Weight: 204 lb (92.5 kg) 209 lb 3.2 oz (94.9 kg) 221 lb 9.6 oz (100.5 kg)    Weight change:    Hemodynamic parameters for last 24 hours:    Intake/Output from previous day: 04/04 0701 - 04/05 0700 In: 1150.8 [P.O.:640; I.V.:110.8; IV Piggyback:400] Out: 1000 [Urine:900; Chest Tube:100]  Intake/Output this shift: No intake/output data recorded.  Current Meds: Scheduled Meds: . acetaminophen  1,000 mg Oral Q6H  . aspirin EC  325 mg Oral Daily  . bisacodyl  10 mg Oral Daily   Or  . bisacodyl  10 mg Rectal Daily  . chlorhexidine gluconate (MEDLINE KIT)  15 mL Mouth Rinse BID  . Chlorhexidine Gluconate Cloth  6 each Topical Daily  . docusate sodium  200 mg Oral Daily  . enoxaparin (LOVENOX) injection  40 mg Subcutaneous QHS  . furosemide  40 mg Oral Daily  . lisinopril  5 mg Oral Daily  . magnesium oxide  400 mg Oral Daily  . mouth rinse  15 mL Mouth Rinse QID  . metoprolol tartrate  12.5 mg Oral BID  . moving right along book   Does not  apply Once  . pantoprazole  40 mg Oral Daily  . potassium chloride  20 mEq Oral Daily  . rosuvastatin  40 mg Oral q1800  . sodium chloride flush  10-40 mL Intracatheter Q12H  . sodium chloride flush  3 mL Intravenous Q12H   Continuous Infusions: . sodium chloride    .  ceFAZolin (ANCEF) IV Stopped (02/27/18 0110)  . lactated ringers     PRN Meds:.metoprolol tartrate, morphine injection, ondansetron (ZOFRAN) IV, sodium chloride flush, sodium chloride flush, traMADol  General appearance: alert, cooperative and no distress Heart: regular rate and rhythm Lungs: mildly dim in bases Abdomen: benign Extremities: no LE edema Wound: evh incis healing well, sternal dressing C+ D  Lab Results: CBC: Recent Labs    02/26/18 1638 02/26/18 1640 02/27/18 0535  WBC 12.2*  --  13.5*  HGB 10.5* 10.5* 10.4*  HCT 31.5* 31.0* 32.4*  PLT 149*  --  166   BMET:  Recent Labs    02/26/18 0344  02/26/18 1640 02/27/18 0535  NA 132*  --  138 135  K 4.1  --  4.2 4.3  CL 103  --  101 100*  CO2 23  --   --  24  GLUCOSE 140*  --  141* 135*  BUN 15  --  23* 21*  CREATININE 0.99   < > 1.10 1.16  CALCIUM 7.6*  --   --  8.9   < > = values in this interval not displayed.    CMET: Lab Results  Component Value Date   WBC 13.5 (H) 02/27/2018   HGB 10.4 (L) 02/27/2018   HCT 32.4 (L) 02/27/2018   PLT 166 02/27/2018   GLUCOSE 135 (H) 02/27/2018   CHOL 160 11/21/2017   TRIG 208 (H) 11/21/2017   HDL 43 11/21/2017   LDLDIRECT 98.0 04/21/2015   LDLCALC 75 11/21/2017   ALT 20 02/24/2018   AST 24 02/24/2018   NA 135 02/27/2018   K 4.3 02/27/2018   CL 100 (L) 02/27/2018   CREATININE 1.16 02/27/2018   BUN 21 (H) 02/27/2018   CO2 24 02/27/2018   INR 1.40 02/25/2018   HGBA1C 5.5 02/24/2018      PT/INR:  Recent Labs    02/25/18 1237  LABPROT 17.0*  INR 1.40   Radiology: No results found.   Assessment/Plan: S/P Procedure(s) (LRB): CORONARY ARTERY BYPASS GRAFTING (CABG), ON PUMP, TIMES  TWO, USING LEFT INTERNAL MAMMARY ARTERY AND ENDOSCOPICALLY HARVESTED RIGHT GREATER SAPHENOUS VEIN (N/A) TRANSESOPHAGEAL ECHOCARDIOGRAM (TEE) (N/A)  1 conts to progress nicely, hemodyn stable in sinus rhythm - BP variable with SBP 90's to 170's- may be able to start low dose ACE-I soon- monitor for now 2 mental status showing steady improvement moving towards baseline 3 minor ABL anemia is stable 4 electrolytes normal, BUN/Creat stable , good UOP, Wt is not accurate. No increased vasc congestion on CXR- cont spont diuresis 5 sats ok on RA, Atx - mild, cont routine pulm toilet 6 blood sugars fairly well controlled- cont SSI 7 advance diet 8 tx to [facility] 02/27/2018 7:22 AM   I have seen and examined the patient and agree with the assessment and plan as outlined.  Doing remarkably well.  Restart ACE-I.  Continue low dose beta blocker.  Mobilize.  PT consult to assist due to h/o Parkinson's.  Transfer 4E.  Likely will need bedside sitter at night due to fall risk caused by impulsive behavior w/ frequent attempts to get up out of bed w/out assistance.  Rexene Alberts, MD 02/27/2018 1:55 PM

## 2018-02-27 NOTE — Discharge Summary (Signed)
Physician Discharge Summary       [address]       [place] [address]             [phone]    Patient ID: Calvin Ramirez MRN: [id number] DOB/AGE: Jul 28, 1941 77 y.o.  Admit date: 02/24/2018 Discharge date: 03/04/2018  Admission Diagnoses: 1. Severe coronary artery disease 2. Acute diastolic (congestive) heart failure ([facility]) Discharge Diagnoses:  1. S/P CABG x 2 2. ABL anemia 3. History of hyperlipidemia 4. History of essential hypertension 5. History of bilateral iliac artery aneurysm (H[facility]) 6. History of dementia 7. History of Parkinson's disease ([facility]) 8. History of arthritis  Procedure (s):  RIGHT/LEFT HEART CATH AND CORONARY ANGIOGRAPHY by Dr. Tamala Julian on 02/24/2018:  Conclusion    Critical distal left main coronary artery disease and the culprit for the patient developing flash pulmonary edema 2 weeks ago at his grandsons wedding in [place].  Syntax score less than 22.  Patent proximal LAD stent with eccentric 50-70% stenosis beyond the stent distal margin in the mid LAD.  Widely patent circumflex  Widely patent right coronary including a distal RCA and continuation RCA stents that are widely patent.  A relatively small PDA contains 70% ostial narrowing.  Normal right heart pressures.  Normal left ventricular systolic function with normal LVEDP.  Estimated ejection fraction 55%.  Echo performed in [place] 2 weeks ago confirms normal LV function.  RECOMMENDATIONS:   Heart team approach with TCTS.  He has severe distal left main disease and would probably fare better over the long-term with coronary bypass grafting.  The Syntax score is relatively low and stenting could be done if for some reason he is not a surgical candidate.  Due to the unpredictability and the patient's clinical course with recent flash pulmonary edema, will hospitalize, anticoagulate, and manage further after heart team approach once best decision for treatment is determined.      Coronary Artery Bypass Grafting x 2              Left Internal Mammary Artery to Distal Left Anterior Descending Coronary Artery             Saphenous Vein Graft to Second Obtuse Marginal Branch of Left Circumflex Coronary Artery             Endoscopic Vein Harvest from Right Thigh by Dr. Roxy Manns on 02/25/2018.   History of Presenting Illness: Patient is a 77 year old male with history of coronary artery disease, hypertension, hyperlipidemia, and mild Parkinson's disease who has been referred for surgical consultation to discuss treatment options for management of critical left main coronary artery disease.  The patient's cardiac history dates back to 2011 when he presented with symptoms of angina pectoris.  He underwent cardiac catheterization and was treated with PCI and stenting including drug-eluting stents in the proximal left anterior descending coronary artery, the distal right coronary artery, and the posterolateral branch of the right coronary artery.  He did well clinically and has been followed regularly ever since by Dr. Tamala Julian.  He was last seen in follow-up in October 2018 at which time he was doing well.  Patient was recently traveling in [place] for a wedding.  He was under considerable stress at the time.  He was awoken from his sleep in the early morning of February 13, 2018 with severe resting shortness of breath.  He was found to be hypoxic and taken directly to the emergency room at [facility].  Symptoms improved quickly.  Echocardiogram revealed normal left ventricular systolic function with ejection fraction estimated 55-60%.  There was restrictive left ventricular filling felt secondary to severe diastolic dysfunction.  There was severely dilated left atrium with moderate mitral regurgitation.  The patient was seen by a cardiologist in treated with IV diuretics and did remarkably well.  Troponin levels were slightly elevated but remained flat.  BNP  level was 414.  The patient was advised to have a stress test and/or cardiac catheterization, but the patient insisted on returning to [facility] for further workup.  He was brought in for elective cardiac catheterization by Dr. Tamala Julian earlier today.  Catheterization revealed critical left main coronary artery disease with preserved left ventricular function.  Cardiothoracic surgical consultation was requested.  Patient is married and lives locally in [place] with his wife.  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  He has remained functionally independent throughout retirement but he admits that he is not very active physically.  He has been less motivated to do any sort of physical activity although he does still mow the lawn in tend to jobs around the house.  He has mild chronic back pain but ambulates without assistance.  He has had some mild problems with balance, a tremor involving the left upper extremity, and some intermittent problems with mild confusion.  2 years ago he was evaluated by a neurologist and told that he may have early Parkinson's disease.  He is not on any sort of medical therapy.  He denies any recent history of exertional chest pain or chest tightness.  He has not had problems with shortness of breath other than that which occurred recently in [place].  He denies any history of PND, orthopnea, or lower extremity edema.  Dr. Sherryl Barters the indications, risks, and potential benefits of coronary artery bypass grafting with the patient and his wife. Alternative treatment strategies have been discussed, including the relative risks, benefits and long term prognosis associated with medical therapy, percutaneous coronary intervention, and surgical revascularization.  Expectations for the patient's postoperative convalescence have been discussed.  The patient understands and accepts all potential associated  risks of surgery. Pre operative carotid duplex US showed no significant internal carotid artery stenosis bilaterally. He underwent a CABG x 2 on 02/25/2018.  Brief Hospital Course:  The patient was extubated the evening of surgery without difficulty. He remained afebrile and hemodynamically stable. He was initially paced. Gordy Councilman, a line, chest tubes, and foley were removed early in the post operative course. He had confusion post op but has a history of dementia and Parkinsons.  He was started on Lopressor. He was later started on Lisinopril for better BP control. He was volume over loaded and diuresed. He had ABL anemia. He did not require a post op transfusion. Last H and H was 9.8/28. He was weaned off the insulin drip.  The patient's HGA1C pre op was 5.5. The patient was felt surgically stable for transfer from the ICU to PCTU for further convalescence  He/she continues to progress with cardiac rehab.  The patient has a significant chronic dementia and has had a lot of postoperative confusion.  Arrangements are being made for home care to assist with this matter as his acute delirium component has not reached baseline.  He was ambulating on room air. He has been tolerating a diet and has had a bowel movement. Epicardial pacing wires were removed.  He has had some short episodes of atrial fibrillation with rapid ventricular response and has been started on p.o. amiodarone.  Due to his significant dementia he is not felt to be a candidate for anticoagulation therapy.  Chest tube sutures will be removed the day of discharge. The patient is felt surgically stable for discharge today.   Latest Vital Signs: Blood pressure 126/66, pulse 72, temperature 98.3 F (36.8 C), temperature source Oral, resp. rate 19, height 5\' 10"  (1.778 m), weight 202 lb 1.6 oz (91.7 kg), SpO2 95 %.  Physical Exam: General appearance: alert, cooperative and no distress Heart: regular rate and rhythm Lungs: mildly dim in  bases Abdomen: benign Extremities: no LE edema Wound: evh incis healing well, sternal dressing C+ D   Discharge Condition: Stable and discharged to home with home health.  Recent laboratory studies:  Lab Results  Component Value Date   WBC 9.9 02/28/2018   HGB 9.8 (L) 02/28/2018   HCT 28.9 (L) 02/28/2018   MCV 91.2 02/28/2018   PLT 163 02/28/2018   Lab Results  Component Value Date   NA 139 03/04/2018   K 3.6 03/04/2018   CL 102 03/04/2018   CO2 25 03/04/2018   CREATININE 1.16 03/04/2018   GLUCOSE 117 (H) 03/04/2018      Diagnostic Studies: Dg Chest 2 View  Result Date: 02/28/2018 CLINICAL DATA:  Status post CABG. EXAM: CHEST - 2 VIEW COMPARISON:  02/27/2018 FINDINGS: Stable heart size and mediastinal contours. Mild bibasilar atelectasis present. There are small bilateral posterior pleural effusions. No pulmonary edema, focal airspace consolidation or pneumothorax identified. IMPRESSION: Small bilateral pleural effusions. No pneumothorax. Bibasilar atelectasis present. Electronically Signed   By: Aletta Edouard M.D.   On: 02/28/2018 09:37   Dg Chest Port 1 View  Result Date: 02/27/2018 CLINICAL DATA:  Status post coronary artery bypass grafting for coronary artery disease. Hypertension. EXAM: PORTABLE CHEST 1 VIEW COMPARISON:  February 26, 2018 FINDINGS: Swan-Ganz catheter, left chest tube, and mediastinal drain have been removed. Temporary pacemaker wires are attached to the right heart. No pneumothorax. There is airspace consolidation in both lower lobes with small left pleural effusion. There is cardiomegaly. The pulmonary vascularity is normal. No adenopathy. No bone lesions. IMPRESSION: No pneumothorax. Airspace consolidation in both lower lobes medially. Suspect atelectasis, although there may be superimposed pneumonia in the lung bases. Small left pleural effusion. Lungs elsewhere clear. Stable cardiomegaly. Electronically Signed   By: Lowella Grip III M.D.   On: 02/27/2018  07:53   Dg Chest Port 1 View  Result Date: 02/26/2018 CLINICAL DATA:  CABG.  Atelectasis. EXAM: PORTABLE CHEST 1 VIEW COMPARISON:  02/25/2018. FINDINGS: Patient is extubated. Corresponding low lung volumes. Swan-Ganz catheter tip main pulmonary artery. LEFT chest tube. No pneumothorax. Mediastinal tube. IMPRESSION: Low lung volumes post extubation. No pneumothorax. Satisfactory postoperative appearance. Electronically Signed   By: Staci Righter M.D.   On: 02/26/2018 08:40   Dg Chest Port 1 View  Result Date: 02/25/2018 CLINICAL DATA:  Atelectasis EXAM: PORTABLE CHEST 1 VIEW COMPARISON:  Yesterday FINDINGS: Endotracheal tube tip at the clavicular heads. Swan-Ganz catheter via right IJ approach with tip at the pulmonary artery outflow tract. Thoracic drains. Lower lung atelectasis, mainly on the right. No Kerley lines, effusion, or pneumothorax. Interval changes of CABG. Stable heart size. IMPRESSION: 1. Mild atelectasis.  No visible pneumothorax. 2. Unremarkable tubes and central line. Electronically Signed   By: Monte Fantasia M.D.   On: 02/25/2018 14:21   Dg Chest [facility]  1 View  Result Date: 02/24/2018 CLINICAL DATA:  CHF, hypertension, Parkinson's, coronary artery disease post stenting EXAM: PORTABLE CHEST 1 VIEW COMPARISON:  Portable exam 1653 hours compared to 08/10/2010 FINDINGS: Minimal enlargement of cardiac silhouette. Mediastinal contours and pulmonary vascularity normal. Lungs clear. No pulmonary infiltrate, pleural effusion or pneumothorax. Bones unremarkable. IMPRESSION: Minimal enlargement of cardiac silhouette with out acute infiltrate. Electronically Signed   By: Lavonia Dana M.D.   On: 02/24/2018 17:20       Discharge Instructions    Amb Referral to Cardiac Rehabilitation   Complete by:  As directed    Diagnosis:  CABG   CABG X ___:  2   Discharge patient   Complete by:  As directed    Discharge disposition:  01-Home or Self Care   Discharge patient date:  03/04/2018    Face-to-face encounter (required for Medicare/Medicaid patients)   Complete by:  As directed    I John Giovanni certify that this patient is under my care and that I, or a nurse practitioner or physician's assistant working with me, had a face-to-face encounter that meets the physician face-to-face encounter requirements with this patient on 03/04/2018. The encounter with the patient was in whole, or in part for the following medical condition(s) which is the primary reason for home health care (List medical condition): postop from CABG with dementia   The encounter with the patient was in whole, or in part, for the following medical condition, which is the primary reason for home health care:  post op with chronic dementia   I certify that, based on my findings, the following services are medically necessary home health services:   Nursing Physical therapy     Reason for Medically Necessary Home Health Services:  Skilled Nursing- Change/Decline in Patient Status   My clinical findings support the need for the above services:  Cognitive impairments, dementia, or mental confusion  that make it unsafe to leave home   Further, I certify that my clinical findings support that this patient is homebound due to:  Mental confusion   Home Health   Complete by:  As directed    To provide the following care/treatments:   PT OT RN Home Health Aide        Discharge Medications: Allergies as of 03/04/2018      Reactions   Levaquin [levofloxacin Hemihydrate] Itching, Rash   Penicillins Itching, Rash, Other (See Comments)   Has patient had a PCN reaction causing immediate rash, facial/tongue/throat swelling, SOB or lightheadedness with hypotension: Unknown Has patient had a PCN reaction causing severe rash involving mucus membranes or skin necrosis: No Has patient had a PCN reaction that required hospitalization: No Has patient had a PCN reaction occurring within the last 10 years: No If all of the above  answers are "NO", then may proceed with Cephalosporin use.      Medication List    STOP taking these medications   ADVIL PM PO   aspirin 81 MG tablet Replaced by:  aspirin 325 MG EC tablet   furosemide 20 MG tablet Commonly known as:  LASIX   GLUCOSAMINE-CHONDROITIN PO   rosuvastatin 40 MG tablet Commonly known as:  CRESTOR     TAKE these medications   amiodarone 200 MG tablet Commonly known as:  PACERONE Take 1 tablet (200 mg total) by mouth 2 (two) times daily after a meal.   aspirin 325 MG EC tablet Take 1 tablet (325 mg total) by mouth daily. Start taking  on:  03/05/2018 Replaces:  aspirin 81 MG tablet   diphenhydrAMINE-zinc acetate cream Commonly known as:  BENADRYL Apply topically daily as needed for itching.   lisinopril 20 MG tablet Commonly known as:  PRINIVIL,ZESTRIL Take 1 tablet (20 mg total) by mouth daily. Start taking on:  03/05/2018 What changed:    medication strength  how much to take   metoprolol tartrate 25 MG tablet Commonly known as:  LOPRESSOR Take 1 tablet (25 mg total) by mouth 2 (two) times daily.   ONE-A-DAY 50 PLUS PO Take 1 tablet by mouth daily.            Durable Medical Equipment  (From admission, onward)        Start     Ordered   03/01/18 0831  For home use only DME Walker rolling  Once    Comments:  Please give one with 5" wheels  Question:  Patient needs a walker to treat with the following condition  Answer:  Physical deconditioning   03/01/18 0830     The patient has been discharged on:   1.Beta Blocker:  Yes Blue.Reese   ]                              No   [   ]                              If No, reason:  2.Ace Inhibitor/ARB: Yes [ y  ]                                     No  [    ]                                     If No, reason:  3.Statin:   Yes [   ]                  No  [ n  ]                  If No, reason: Be contributing to dementia  4.Shela CommonsVelta Addison  [ y  ]                  No   [   ]                   If No, reason:   Follow Up Appointments: Follow-up Information    Belva Crome, MD Follow up.   Specialty:  Cardiology Contact information: [address] [place] [address] [phone]        Rexene Alberts, MD. Go on 04/06/2018.   Specialty:  Cardiothoracic Surgery Why:  PA/LAT CXR to be taken (at [facility] which is in the same building as Dr. Guy Sandifer office) on 04/06/2018 at ;Appointment time is at  Contact information: [address] [place] [address] [place] Follow up.   Why:  rolling walker arranged- to be delivered to room prior to discharge Contact information: [address] [place] [address] [phone]  Signed: Gaspar Bidding 03/04/2018, 12:10 PM

## 2018-02-27 NOTE — Progress Notes (Signed)
Report called to 4E.

## 2018-02-27 NOTE — Progress Notes (Signed)
1330 Pt sleeping soundly now. Talked with pt's RN. He walked prior to transfer at about 1200 and had bath. Will let pt sleep and staff can walk later. Will follow up tomorrow. Graylon Good RN BSN 02/27/2018 1:33 PM

## 2018-02-27 NOTE — Progress Notes (Signed)
Epicardial pacing wires removed per order. Sites clean and dry and wire ends intact. Vitals obtained and stable. Patient on bedrest for one hour. Will continue to monitor.

## 2018-02-27 NOTE — Progress Notes (Signed)
Wife into visit this morning concerned about patients confusion. She has taken him to get evaluated for parkinsons. Disease at [facility]. He has had confusion at home before. As the morning has worn on, he has cleared up and knows where he is, the year, who he is and the Software engineer. She will stay with him tonight.  Plan to transfer to floor bed when bed available.

## 2018-02-28 ENCOUNTER — Inpatient Hospital Stay (HOSPITAL_COMMUNITY): Payer: Medicare Other

## 2018-02-28 LAB — BASIC METABOLIC PANEL
Anion gap: 9 (ref 5–15)
BUN: 21 mg/dL — ABNORMAL HIGH (ref 6–20)
CO2: 26 mmol/L (ref 22–32)
Calcium: 8.8 mg/dL — ABNORMAL LOW (ref 8.9–10.3)
Chloride: 103 mmol/L (ref 101–111)
Creatinine, Ser: 1.04 mg/dL (ref 0.61–1.24)
GFR calc Af Amer: >60 mL/min (ref >60)
GFR calc non Af Amer: >60 mL/min (ref >60)
Glucose, Bld: 115 mg/dL — ABNORMAL HIGH (ref 65–99)
Potassium: 3.9 mmol/L (ref 3.5–5.1)
Sodium: 138 mmol/L (ref 135–145)

## 2018-02-28 LAB — CBC
HCT: 28.9 % — ABNORMAL LOW (ref 39.0–52.0)
Hemoglobin: 9.8 g/dL — ABNORMAL LOW (ref 13.0–17.0)
MCH: 30.9 pg (ref 26.0–34.0)
MCHC: 33.9 g/dL (ref 30.0–36.0)
MCV: 91.2 fL (ref 78.0–100.0)
Platelets: 163 10*3/uL (ref 150–400)
RBC: 3.17 MIL/uL — ABNORMAL LOW (ref 4.22–5.81)
RDW: 13.6 % (ref 11.5–15.5)
WBC: 9.9 10*3/uL (ref 4.0–10.5)

## 2018-02-28 MED ORDER — LACTULOSE 10 GM/15ML PO SOLN
20.0000 g | Freq: Once | ORAL | Status: AC
  Administered 2018-02-28: 20 g via ORAL
  Filled 2018-02-28: qty 30

## 2018-02-28 MED ORDER — FA-PYRIDOXINE-CYANOCOBALAMIN 2.5-25-2 MG PO TABS
1.0000 | ORAL_TABLET | Freq: Every day | ORAL | Status: DC
  Administered 2018-02-28 – 2018-03-04 (×5): 1 via ORAL
  Filled 2018-02-28 (×5): qty 1

## 2018-02-28 MED ORDER — POTASSIUM CHLORIDE CRYS ER 20 MEQ PO TBCR
20.0000 meq | EXTENDED_RELEASE_TABLET | Freq: Once | ORAL | Status: AC
  Administered 2018-02-28: 20 meq via ORAL
  Filled 2018-02-28: qty 1

## 2018-02-28 NOTE — Progress Notes (Signed)
Physical Therapy Evaluation and Discharge Patient Details Name: Calvin Ramirez MRN: [id number] DOB: [birth year] Today's Date: 02/28/2018    History of Present Illness 77 year old male with history of coronary artery disease, hypertension, hyperlipidemia, and mild Parkinson's disease, dx  critical left main coronary artery disease.s/p CORONARY ARTERY BYPASS GRAFTING (CABG), ON PUMP, TIMES TWO, USING LEFT INTERNAL MAMMARY ARTERY AND ENDOSCOPICALLY HARVESTED RIGHT GREATER SAPHENOUS VEIN (N/A)    PT Comments    Per notes pt having some confusion and cognitive deficits. During PT evaluation pt oriented x4 and able to recall 2/3 sternal precautions at end of session. Pt currently, min A for trunk to upright in sidelying to sit for maintenance of sternal precautions, min guard for sit>stand transfers and supervision of ambulation of 470 feet with RW. At time of evaluation, pt with adequate safety to return home with 24hr supervision and implementation of of Cardiac Rehab exercises. PT discussed with pt that he would have to listen to and do what his wife said to maintain his sternal precautions and safety. No additional PT needs at this time. PT signing off.    Follow Up Recommendations  Other (comment);Supervision/Assistance - 24 hour(Cardiac Rehab)     Equipment Recommendations  Rolling walker with 5" wheels       Precautions / Restrictions Precautions Precautions: Sternal Precaution Comments: able to recall 2/3 sternal precautions at end of treatment  Restrictions Weight Bearing Restrictions: Yes(sternal prec) RUE Weight Bearing: (sternal prec) LUE Weight Bearing: (sternal prec)    Mobility  Bed Mobility Overal bed mobility: Needs Assistance Bed Mobility: Rolling;Sidelying to Sit Rolling: Min guard Sidelying to sit: Min assist       General bed mobility comments: minA for trunk to upright from sidelying to maintain sternal precautions  Transfers Overall transfer level: Needs  assistance Equipment used: None Transfers: Sit to/from Stand Sit to Stand: Min guard         General transfer comment: min guard for safety, vc for use of stenal pillow and power up with legs, able to stand and steady himself without hands on assist before reaching to EVA walker   Ambulation/Gait Ambulation/Gait assistance: Min guard;Supervision Ambulation Distance (Feet): 470 Feet(470x1 with EVA walker, 470 x1 with RW) Assistive device: Rolling walker (2 wheeled)(EVA walker ) Gait Pattern/deviations: Step-through pattern;Decreased step length - right;Decreased step length - left;Trunk flexed Gait velocity: slowed Gait velocity interpretation: Below normal speed for age/gender General Gait Details: ambulated 470 feet with EVA walker and min guard assist for safety, transitioned to RW and supervision assist, vc for proximity to walker and upright posture       Balance Overall balance assessment: Needs assistance Sitting-balance support: No upper extremity supported;Feet supported Sitting balance-Leahy Scale: Good     Standing balance support: No upper extremity supported;During functional activity Standing balance-Leahy Scale: Fair                              Cognition Arousal/Alertness: Awake/alert Behavior During Therapy: WFL for tasks assessed/performed Overall Cognitive Status: History of cognitive impairments - at baseline                                 General Comments: pt wife worried about pt forgetting precautions and not listening to her         General Comments General comments (skin integrity, edema, etc.): HR max with ambulation 102 bpm, at  rest HR 76 bpm Pt wife is very concerned about pt's safety and not listening to her when she reminds him of sternal precautions, also has questions about what exercises he should be doing, notified Cardiac Rehab team of concerns and education needed on rehab program       Pertinent Vitals/Pain  Pain Assessment: No/denies pain    Home Living Family/patient expects to be discharged to:: Private residence Living Arrangements: Spouse/significant other Available Help at Discharge: Available 24 hours/day;Family Type of Home: House Home Access: Stairs to enter(2 in riser walker depth )   Home Layout: One level Home Equipment: Shower seat;Hand held shower head      Prior Function Level of Independence: Independent      Comments: underlying cognitive deficits. driving, community ambulator   PT Goals (current goals can now be found in the care plan section) Acute Rehab PT Goals Patient Stated Goal: Go home PT Goal Formulation: With patient       PT Plan         AM-PAC PT "6 Clicks" Daily Activity  Outcome Measure  Difficulty turning over in bed (including adjusting bedclothes, sheets and blankets)?: A Little Difficulty moving from lying on back to sitting on the side of the bed? : Unable Difficulty sitting down on and standing up from a chair with arms (e.g., wheelchair, bedside commode, etc,.)?: Unable Help needed moving to and from a bed to chair (including a wheelchair)?: A Little Help needed walking in hospital room?: A Little Help needed climbing 3-5 steps with a railing? : A Little 6 Click Score: 14    End of Session Equipment Utilized During Treatment: Gait belt Activity Tolerance: Patient tolerated treatment well Patient left: in chair;with call bell/phone within reach;with chair alarm set;with family/visitor present Nurse Communication: Mobility status PT Visit Diagnosis: Other abnormalities of gait and mobility (R26.89);Difficulty in walking, not elsewhere classified (R26.2)     Time: 2703-5009 PT Time Calculation (min) (ACUTE ONLY): 35 min  Charges:  $Gait Training: 8-22 mins                    G Codes:       Orbie Grupe B. Migdalia Dk PT, DPT Acute Rehabilitation  [phone] Pager [phone]     [place] 02/28/2018, 10:19  AM

## 2018-02-28 NOTE — Progress Notes (Addendum)
CARDIAC REHAB PHASE I   PRE:  Rate/Rhythm: 63 SR    BP: sitting 120/73    SaO2: 94 RA  MODE:  Ambulation: 380 ft   POST:  Rate/Rhythm: 78 SR    BP: sitting 120/76     SaO2: 97 RA  Pt moving well but needs reminders for sternal precautions. Steady with RW. No c/o. To bed after walk. Reviewed sternal precautions and IS. Encouraged one more walk. Discussed CRPII with pt and wife. Will refer to [place], ACSM 02/28/2018 2:33 PM

## 2018-02-28 NOTE — Progress Notes (Addendum)
      [address]       [place] [address]             [phone]        3 Days Post-Op Procedure(s) (LRB): CORONARY ARTERY BYPASS GRAFTING (CABG), ON PUMP, TIMES TWO, USING LEFT INTERNAL MAMMARY ARTERY AND ENDOSCOPICALLY HARVESTED RIGHT GREATER SAPHENOUS VEIN (N/A) TRANSESOPHAGEAL ECHOCARDIOGRAM (TEE) (N/A)  Subjective: Patient passing flatus but no bowel movement yet.  Objective: Vital signs in last 24 hours: Temp:  [98 F (36.7 C)-98.8 F (37.1 C)] 98 F (36.7 C) (04/06 0422) Pulse Rate:  [67-78] 71 (04/06 0456) Cardiac Rhythm: Normal sinus rhythm (04/06 0701) Resp:  [17-32] 26 (04/06 0632) BP: (116-157)/(58-79) 157/78 (04/06 0422) SpO2:  [96 %-100 %] 97 % (04/06 0456) Weight:  [218 lb (98.9 kg)-221 lb (100.2 kg)] 218 lb (98.9 kg) (04/06 4982)  Pre op weight 94.9 kg Current Weight  02/28/18 218 lb (98.9 kg)       Intake/Output from previous day: 04/05 0701 - 04/06 0700 In: 600 [P.O.:600] Out: 1170 [Urine:1170]   Physical Exam:  Cardiovascular: RRR Pulmonary: Clear to auscultation bilaterally Abdomen: Soft, non tender, bowel sounds present. Extremities: Mild bilateral lower extremity edema. Ecchymosis right thigh. Wounds: Aquacel dressing is removed and wound is clean and dry.  No erythema or signs of infection.  Lab Results: CBC: Recent Labs    02/27/18 0535 02/28/18 0207  WBC 13.5* 9.9  HGB 10.4* 9.8*  HCT 32.4* 28.9*  PLT 166 163   BMET:  Recent Labs    02/27/18 0535 02/28/18 0207  NA 135 138  K 4.3 3.9  CL 100* 103  CO2 24 26  GLUCOSE 135* 115*  BUN 21* 21*  CREATININE 1.16 1.04  CALCIUM 8.9 8.8*    PT/INR:  Lab Results  Component Value Date   INR 1.40 02/25/2018   INR 1.1 02/18/2018   ABG:  INR: Will add last result for INR, ABG once components are confirmed Will add last 4 CBG results once components are confirmed  Assessment/Plan:  1. CV - SR. On Lopressor 12.5 mg bid and Lisinopril 5 mg daily.  Monitor BP as may need to titrate Lisinopril. 2.  Pulmonary - On room air. Encourage incentive spirometer. 3. Volume Overload - On Lasix 40 mg daily 4.  Acute blood loss anemia - H and H slightly decreased to 9.8 and 28.9. Start Foltx 5. Parkinsons-PT assisting with mobility 6. Supplement potassium 7. LOC constipation 8. Hope to discharge Monday    Sharalyn Ink [facility] 02/28/2018,9:11 AM Patient seen and examined, agree with above Did well with PT today  Remo Lipps C. Roxan Hockey, MD [facility] [phone]

## 2018-03-01 MED ORDER — SORBITOL 70 % SOLN
30.0000 mL | Freq: Once | Status: DC
  Filled 2018-03-01: qty 30

## 2018-03-01 MED ORDER — POTASSIUM CHLORIDE CRYS ER 20 MEQ PO TBCR
20.0000 meq | EXTENDED_RELEASE_TABLET | Freq: Every day | ORAL | Status: DC
  Administered 2018-03-02 – 2018-03-03 (×2): 20 meq via ORAL
  Filled 2018-03-01 (×2): qty 1

## 2018-03-01 MED ORDER — TRAMADOL HCL 50 MG PO TABS: 50.0000 mg | ORAL_TABLET | ORAL | Status: DC | PRN

## 2018-03-01 MED ORDER — LISINOPRIL 10 MG PO TABS
20.0000 mg | ORAL_TABLET | Freq: Every day | ORAL | Status: DC
  Administered 2018-03-02: 20 mg via ORAL
  Filled 2018-03-01: qty 2

## 2018-03-01 MED ORDER — FUROSEMIDE 40 MG PO TABS
40.0000 mg | ORAL_TABLET | Freq: Every day | ORAL | Status: DC
  Administered 2018-03-02: 40 mg via ORAL
  Filled 2018-03-01: qty 1

## 2018-03-01 NOTE — Care Management Note (Signed)
Case Management Note Marvetta Gibbons RN, BSN Unit 4E-Case Manager-- [place] coverage [phone]  Patient Details  Name: Calvin Ramirez MRN: [id number] Date of Birth: 1941/01/23  Subjective/Objective:   Pt admitted s/p CABGx2                 Action/Plan: PTA pt lived at home with wife- CM to follow for transition of care needs.   Expected Discharge Date:                  Expected Discharge Plan:  Home/Self Care  In-House Referral:     Discharge planning Services  CM Consult  Post Acute Care Choice:  Durable Medical Equipment Choice offered to:  Patient  DME Arranged:  [place] rolling DME Agency:  [place]:    [place]:     Status of Service:  Completed, signed off  If discussed at [place] of Stay Meetings, dates discussed:    Discharge Disposition: home/self care   Additional Comments:  03/01/18- 1000- Marvetta Gibbons RN, CM- noted DME order for RW- call made to [facility] with [facility] for DME needs- RW to be delivered to room prior to discharge.   Dawayne Patricia, RN 03/01/2018, 10:30 AM

## 2018-03-01 NOTE — Progress Notes (Addendum)
      [address]       [place] [address]             [phone]        4 Days Post-Op Procedure(s) (LRB): CORONARY ARTERY BYPASS GRAFTING (CABG), ON PUMP, TIMES TWO, USING LEFT INTERNAL MAMMARY ARTERY AND ENDOSCOPICALLY HARVESTED RIGHT GREATER SAPHENOUS VEIN (N/A) TRANSESOPHAGEAL ECHOCARDIOGRAM (TEE) (N/A)  Subjective: Patient gets confused at night but lucid during the day. No bowel movement despite Lactulose.  Objective: Vital signs in last 24 hours: Temp:  [97.8 F (36.6 C)-98.4 F (36.9 C)] 98.1 F (36.7 C) (04/07 0612) Pulse Rate:  [63-67] 67 (04/07 0749) Cardiac Rhythm: Sinus bradycardia (04/07 0815) Resp:  [16-29] 29 (04/07 0749) BP: (112-162)/(62-88) 162/88 (04/07 0749) SpO2:  [95 %-97 %] 97 % (04/07 0749)  Pre op weight 94.9 kg Current Weight  02/28/18 218 lb (98.9 kg)       Intake/Output from previous day: 04/06 0701 - 04/07 0700 In: 480 [P.O.:480] Out: 670 [Urine:670]   Physical Exam:  Cardiovascular: RRR Pulmonary: Clear to auscultation bilaterally Abdomen: Soft, non tender, bowel sounds present. Extremities: Mild bilateral lower extremity edema. Ecchymosis right thigh. Wounds:Sternal wound is clean and dry.  No erythema or signs of infection.  Lab Results: CBC: Recent Labs    02/27/18 0535 02/28/18 0207  WBC 13.5* 9.9  HGB 10.4* 9.8*  HCT 32.4* 28.9*  PLT 166 163   BMET:  Recent Labs    02/27/18 0535 02/28/18 0207  NA 135 138  K 4.3 3.9  CL 100* 103  CO2 24 26  GLUCOSE 135* 115*  BUN 21* 21*  CREATININE 1.16 1.04  CALCIUM 8.9 8.8*    PT/INR:  Lab Results  Component Value Date   INR 1.40 02/25/2018   INR 1.1 02/18/2018   ABG:  INR: Will add last result for INR, ABG once components are confirmed Will add last 4 CBG results once components are confirmed  Assessment/Plan:  1. CV - SR, occasional SB (60's). Hypertensive this am. On Lopressor 12.5 mg bid and Lisinopril 5 mg daily. Will increase  Lisinopril for better BP control. 2.  Pulmonary - On room air. Encourage incentive spirometer. 3. Volume Overload - On Lasix 40 mg daily 4.  Acute blood loss anemia - Last H and H slightly decreased to 9.8 and 28.9. Continue Foltx 6. Sorbitol for constipation 5. Parkinsons,night time confusion 7. Hope to discharge in am    Sharalyn Ink [facility] 03/01/2018,8:26 AM  Patient seen and examined, agree with above Apparently he had a disagreement with his wife re: sternal precautions.  Possibly home or SNF early in the week.  Revonda Standard Roxan Hockey, MD [facility] [phone]

## 2018-03-02 MED ORDER — FUROSEMIDE 20 MG PO TABS
20.0000 mg | ORAL_TABLET | Freq: Every day | ORAL | Status: DC
  Administered 2018-03-03: 20 mg via ORAL
  Filled 2018-03-02: qty 1

## 2018-03-02 MED ORDER — LISINOPRIL 10 MG PO TABS
20.0000 mg | ORAL_TABLET | Freq: Every day | ORAL | Status: DC
  Administered 2018-03-03 – 2018-03-04 (×2): 20 mg via ORAL
  Filled 2018-03-02 (×2): qty 2

## 2018-03-02 MED ORDER — ASPIRIN EC 325 MG PO TBEC
325.0000 mg | DELAYED_RELEASE_TABLET | Freq: Every day | ORAL | Status: DC
  Administered 2018-03-03 – 2018-03-04 (×2): 325 mg via ORAL
  Filled 2018-03-02 (×2): qty 1

## 2018-03-02 NOTE — Progress Notes (Addendum)
[address]       [place] [address]             [phone]      5 Days Post-Op Procedure(s) (LRB): CORONARY ARTERY BYPASS GRAFTING (CABG), ON PUMP, TIMES TWO, USING LEFT INTERNAL MAMMARY ARTERY AND ENDOSCOPICALLY HARVESTED RIGHT GREATER SAPHENOUS VEIN (N/A) TRANSESOPHAGEAL ECHOCARDIOGRAM (TEE) (N/A) Subjective: conts to do well but night time confusion persists  Objective: Vital signs in last 24 hours: Temp:  [98.2 F (36.8 C)-98.7 F (37.1 C)] 98.7 F (37.1 C) (04/08 0448) Pulse Rate:  [67-72] 72 (04/07 2258) Cardiac Rhythm: Normal sinus rhythm (04/07 2258) Resp:  [20-29] 20 (04/08 0448) BP: (123-162)/(70-92) 152/76 (04/08 0448) SpO2:  [97 %-99 %] 98 % (04/08 0448)  Hemodynamic parameters for last 24 hours:    Intake/Output from previous day: 04/07 0701 - 04/08 0700 In: 600 [P.O.:600] Out: -  Intake/Output this shift: No intake/output data recorded.  General appearance: alert, cooperative and no distress Heart: regular rate and rhythm Lungs: dim in lower fields Abdomen: benign Extremities: some LE eedema Wound: incis healing well  Lab Results: Recent Labs    02/28/18 0207  WBC 9.9  HGB 9.8*  HCT 28.9*  PLT 163   BMET:  Recent Labs    02/28/18 0207  NA 138  K 3.9  CL 103  CO2 26  GLUCOSE 115*  BUN 21*  CREATININE 1.04  CALCIUM 8.8*    PT/INR: No results for input(s): LABPROT, INR in the last 72 hours. ABG    Component Value Date/Time   PHART 7.379 02/26/2018 0504   HCO3 23.6 02/26/2018 0504   TCO2 24 02/26/2018 1640   ACIDBASEDEF 0.7 02/26/2018 0504   O2SAT 92.3 02/26/2018 0504   CBG (last 3)  Recent Labs    02/27/18 0814 02/27/18 1142  GLUCAP 129* 146*    Meds Scheduled Meds: . acetaminophen  1,000 mg Oral Q6H  . aspirin EC  325 mg Oral Daily  . bisacodyl  10 mg Oral Daily   Or  . bisacodyl  10 mg Rectal Daily  . enoxaparin (LOVENOX) injection  30 mg Subcutaneous Q24H  . folic  acid-pyridoxine-cyancobalamin  1 tablet Oral Daily  . furosemide  40 mg Oral Daily  . lisinopril  20 mg Oral Daily  . magnesium oxide  400 mg Oral Daily  . mouth rinse  15 mL Mouth Rinse QID  . metoprolol tartrate  12.5 mg Oral BID  . moving right along book   Does not apply Once  . pantoprazole  40 mg Oral Daily  . potassium chloride  20 mEq Oral Daily  . rosuvastatin  40 mg Oral q1800  . sorbitol  30 mL Oral Once   Continuous Infusions: PRN Meds:.metoprolol tartrate, traMADol  Xrays Dg Chest 2 View  Result Date: 02/28/2018 CLINICAL DATA:  Status post CABG. EXAM: CHEST - 2 VIEW COMPARISON:  02/27/2018 FINDINGS: Stable heart size and mediastinal contours. Mild bibasilar atelectasis present. There are small bilateral posterior pleural effusions. No pulmonary edema, focal airspace consolidation or pneumothorax identified. IMPRESSION: Small bilateral pleural effusions. No pneumothorax. Bibasilar atelectasis present. Electronically Signed   By: Aletta Edouard M.D.   On: 02/28/2018 09:37    Assessment/Plan: S/P Procedure(s) (LRB): CORONARY ARTERY BYPASS GRAFTING (CABG), ON PUMP, TIMES TWO, USING LEFT INTERNAL MAMMARY ARTERY AND ENDOSCOPICALLY HARVESTED RIGHT GREATER SAPHENOUS VEIN (N/A) TRANSESOPHAGEAL ECHOCARDIOGRAM (TEE) (N/A)  1 doing well, hypertensive at times, lisinopril started yesterday, will monitor. Sinus  rhythm with some PVC's. 2 mild volume overload, cont to diurese 3 no new labs or CXR's 4 disposition uncertain with night time confusion potentially a safety issue with only wife available to assist.   LOS: 6 days    John Giovanni 03/02/2018  I have seen and examined the patient and agree with the assessment and plan as outlined.  Patient is clinically doing quite well although he is still having some intermittent confusion that is somewhat worse than his preop baseline, particularly at night.  His wife has made arrangements for help at home and prefers to take him home if  possible.  Will arrange for home health PT and tentatively plan d/c home tomorrow  Rexene Alberts, MD 03/02/2018 9:30 AM

## 2018-03-02 NOTE — Progress Notes (Signed)
CARDIAC REHAB PHASE I   PRE:  Rate/Rhythm: 65 SR    BP: sitting 141/82    SaO2: 96 RA  MODE:  Ambulation: 660 ft   POST:  Rate/Rhythm: 90 SR    BP: sitting 130/72     SaO2: 96 RA  Pt slightly confused, asking where his wife is. Able to move out of bed and walk to BR with RW. Fairly steady. He walked a short distance with the RW but was steady so the rest of the walk he walked without any AD. Steady, no c/o. Able to handle turns and distractions without problems. To recliner, on chair alarm. Had pt practice IS, able to do 700 consistently. Noted rash on pts upper back, notified RN.  [place], ACSM 03/02/2018 2:12 PM

## 2018-03-03 LAB — BASIC METABOLIC PANEL
Anion gap: 11 (ref 5–15)
BUN: 15 mg/dL (ref 6–20)
CO2: 24 mmol/L (ref 22–32)
Calcium: 9.3 mg/dL (ref 8.9–10.3)
Chloride: 104 mmol/L (ref 101–111)
Creatinine, Ser: 1.09 mg/dL (ref 0.61–1.24)
GFR calc Af Amer: >60 mL/min (ref >60)
GFR calc non Af Amer: >60 mL/min (ref >60)
Glucose, Bld: 106 mg/dL — ABNORMAL HIGH (ref 65–99)
Potassium: 3.7 mmol/L (ref 3.5–5.1)
Sodium: 139 mmol/L (ref 135–145)

## 2018-03-03 MED ORDER — AMIODARONE HCL IN DEXTROSE 360-4.14 MG/200ML-% IV SOLN: 60.0000 mg/h | INTRAVENOUS | Status: DC

## 2018-03-03 MED ORDER — METOPROLOL TARTRATE 25 MG PO TABS
25.0000 mg | ORAL_TABLET | Freq: Two times a day (BID) | ORAL | Status: DC
  Administered 2018-03-03 – 2018-03-04 (×3): 25 mg via ORAL
  Filled 2018-03-03 (×3): qty 1

## 2018-03-03 MED ORDER — AMLODIPINE BESYLATE 5 MG PO TABS: 5.0000 mg | ORAL_TABLET | Freq: Every day | ORAL | Status: DC

## 2018-03-03 MED ORDER — DIPHENHYDRAMINE-ZINC ACETATE 2-0.1 % EX CREA
TOPICAL_CREAM | Freq: Every day | CUTANEOUS | Status: DC | PRN
  Administered 2018-03-03 – 2018-03-04 (×2): via TOPICAL
  Filled 2018-03-03: qty 28

## 2018-03-03 MED ORDER — AMIODARONE IV BOLUS ONLY 150 MG/100ML
150.0000 mg | Freq: Once | INTRAVENOUS | Status: DC
  Filled 2018-03-03: qty 100

## 2018-03-03 MED ORDER — AMIODARONE HCL IN DEXTROSE 360-4.14 MG/200ML-% IV SOLN
INTRAVENOUS | Status: AC
  Filled 2018-03-03: qty 200

## 2018-03-03 MED ORDER — AMIODARONE HCL IN DEXTROSE 360-4.14 MG/200ML-% IV SOLN: 30.0000 mg/h | INTRAVENOUS | Status: DC

## 2018-03-03 MED ORDER — DIPHENHYDRAMINE HCL 25 MG PO CAPS: 25.0000 mg | ORAL_CAPSULE | Freq: Four times a day (QID) | ORAL | Status: DC | PRN

## 2018-03-03 NOTE — Consult Note (Signed)
            [facility] Primary Care Navigator  03/03/2018  JASAUN CARN 06/26/1941 [id number]   Went to see patient at the bedsideto identify possible discharge needs. Patientreports having "shortness of breath, rapid heart rate and chest pain" that resulted to this admission/ surgery (severe coronary artery disease status post CORONARY ARTERY BYPASS GRAFTING). Patient was noted with episodes of forgetfulness but was able to be redirected most times. PatientendorsesDr.Dantae Harris with [facility] ashis primary care provider.   Patientshared usingCostcoand Risk analyst to obtainmedications without any problem.   Patientstatesthatwife has been managinghis medications at homeusing "pill box" system filled every 2 weeks.  Patient reports that he has been driving prior to admission/ surgery but hiswife (Salley) will providetransportation and bring him to hisdoctors'appointmentsafter discharge.  Patientl[facility]withwife at [facility] serves ashisprimary caregiver.   Anticipated discharge plan ishomewith home health services per therapy recommendation.  Patientvoiced understanding to call primary care provider's office whenhereturns home for a post discharge follow-up appointment within1- 2 weeksor sooner if needs arise.Patient letter (with PCP's contact number) was provided asareminder and RN is aware to notify patient's wife about it.  Explained topatientregardingTHN CM services available for health managementat home.  He mentioned having HF this admission (acute diastolic congestive heart failure). He reports monitoring his weight daily and recording. Patient voiced preference and hadverbally agreed for [facility] HF callsto follow-up with hisrecovery at home.  Referral was made for EMMI HFcalls after discharge.  Patient expressed understanding to seekreferral to [facility] care managementfrom primary care  provider if deemed necessary forfurtherservicesin the future.   [facility] care management information provided for future needs that may arise.   For additional questions please contact:  Edwena Felty A. Vardaan Depascale, BSN, RN-BC [facility] PRIMARY CARE Navigator Cell: [phone]

## 2018-03-03 NOTE — Progress Notes (Signed)
Patient in bed voiding and H.R. Went up 139 for a few seconds. Then came back down to the 90's He is in At. Fib EKG done to confirm . R.N. Aware. Patient is asymptomatic 12 Lead EKG did show At. Fib 96. Cont. To monitor patient and rhythm.

## 2018-03-03 NOTE — Evaluation (Signed)
Physical Therapy Evaluation Patient Details Name: Calvin Ramirez MRN: [id number] DOB: Feb 15, 1941 Today's Date: 03/03/2018   History of Present Illness  77 year old male with history of coronary artery disease, hypertension, hyperlipidemia, and mild Parkinson's disease, dx  critical left main coronary artery disease. Pt underwent CABG on 02/25/18. Pt has developed worsening confusion and suffered a fall on 03/03/18  Clinical Impression  Pt re-evaluated by PT and is mobilizing well but his confusion is significant and resulted in a fall this AM. Recommend HHPT for safe transition to home. Will need 24 hour supervision for confusion. Possible Home First program?    Follow Up Recommendations Supervision/Assistance - 24 hour;Home health PT    Equipment Recommendations       Recommendations for Other Services       Precautions / Restrictions Precautions Precautions: Sternal Restrictions Weight Bearing Restrictions: Yes RUE Weight Bearing: (sternal precautions) LUE Weight Bearing: (sternal precautions) Other Position/Activity Restrictions: Sternal precautions      Mobility  Bed Mobility Overal bed mobility: Needs Assistance Bed Mobility: Supine to Sit;Sit to Supine     Supine to sit: Supervision;HOB elevated Sit to supine: Supervision;HOB elevated   General bed mobility comments: Incr time and verbal cues for sternal precautions  Transfers Overall transfer level: Needs assistance Equipment used: None Transfers: Sit to/from Stand Sit to Stand: Supervision         General transfer comment: Supervision for safety. Able to rise following sternal precautions  Ambulation/Gait Ambulation/Gait assistance: Min guard;Supervision Ambulation Distance (Feet): 750 Feet Assistive device: None Gait Pattern/deviations: Step-through pattern;Decreased step length - left;Trunk flexed;Decreased step length - right Gait velocity: slowed Gait velocity interpretation: Below normal speed for  age/gender General Gait Details: Steady gait except one loss of balance with 180 degree turn. Pt able to correct without assist  Stairs Stairs: Yes Stairs assistance: Min guard Stair Management: One rail Left;Alternating pattern;Forwards Number of Stairs: 3 General stair comments: Performed stairs without diffculty  Wheelchair Mobility    Modified Rankin (Stroke Patients Only)       Balance Overall balance assessment: Needs assistance Sitting-balance support: No upper extremity supported;Feet supported Sitting balance-Leahy Scale: Good     Standing balance support: No upper extremity supported;During functional activity Standing balance-Leahy Scale: Good                               Pertinent Vitals/Pain Pain Assessment: No/denies pain    Home Living Family/patient expects to be discharged to:: Private residence Living Arrangements: Spouse/significant other Available Help at Discharge: Available 24 hours/day;Family Type of Home: House Home Access: Stairs to enter(2 in riser walker depth )   Entrance Stairs-Number of Steps: 5 Home Layout: One level Home Equipment: Shower seat;Hand held shower head      Prior Function Level of Independence: Independent         Comments: underlying cognitive deficits. driving, community Programmer, multimedia        Extremity/Trunk Assessment   Upper Extremity Assessment Upper Extremity Assessment: Overall WFL for tasks assessed    Lower Extremity Assessment Lower Extremity Assessment: Overall WFL for tasks assessed       Communication   Communication: No difficulties  Cognition Arousal/Alertness: Awake/alert Behavior During Therapy: Impulsive Overall Cognitive Status: Impaired/Different from baseline Area of Impairment: Orientation;Memory;Problem solving;Safety/judgement                 Orientation Level: Disoriented to;Situation   Memory: Decreased short-term  memory;Decreased recall  of precautions   Safety/Judgement: Decreased awareness of safety;Decreased awareness of deficits   Problem Solving: Requires verbal cues;Requires tactile cues General Comments: Pt very confused this AM and doesn't know why he is here and thinks this is all sabotage.      General Comments General comments (skin integrity, edema, etc.): VSS    Exercises     Assessment/Plan    PT Assessment All further PT needs can be met in the next venue of care  PT Problem List         PT Treatment Interventions      PT Goals (Current goals can be found in the Care Plan section)  Acute Rehab PT Goals PT Goal Formulation: All assessment and education complete, DC therapy    Frequency     Barriers to discharge        Co-evaluation               AM-PAC PT "6 Clicks" Daily Activity  Outcome Measure Difficulty turning over in bed (including adjusting bedclothes, sheets and blankets)?: A Little Difficulty moving from lying on back to sitting on the side of the bed? : A Little Difficulty sitting down on and standing up from a chair with arms (e.g., wheelchair, bedside commode, etc,.)?: A Little Help needed moving to and from a bed to chair (including a wheelchair)?: A Little Help needed walking in hospital room?: A Little Help needed climbing 3-5 steps with a railing? : A Little 6 Click Score: 18    End of Session Equipment Utilized During Treatment: Gait belt Activity Tolerance: Patient tolerated treatment well Patient left: with call bell/phone within reach;with family/visitor present;in bed;with bed alarm set Nurse Communication: Mobility status PT Visit Diagnosis: Other abnormalities of gait and mobility (R26.89);Difficulty in walking, not elsewhere classified (R26.2)    Time: 0379-5583 PT Time Calculation (min) (ACUTE ONLY): 17 min   Charges:   PT Evaluation $PT Eval Moderate Complexity: 1 Mod     PT G CodesMarland Kitchen        [facility] [place] 03/03/2018, 9:56 AM

## 2018-03-03 NOTE — Progress Notes (Addendum)
[address]       , [address]             [phone]      6 Days Post-Op Procedure(s) (LRB): CORONARY ARTERY BYPASS GRAFTING (CABG), ON PUMP, TIMES TWO, USING LEFT INTERNAL MAMMARY ARTERY AND ENDOSCOPICALLY HARVESTED RIGHT GREATER SAPHENOUS VEIN (N/A) TRANSESOPHAGEAL ECHOCARDIOGRAM (TEE) (N/A) Subjective: Just fell Afib last pm- spont converted, not started on amio since happened so quickly Very confused this am  Objective: Vital signs in last 24 hours: Temp:  [97.9 F (36.6 C)-98 F (36.7 C)] 97.9 F (36.6 C) (04/09 0417) Pulse Rate:  [65-92] 72 (04/09 0716) Cardiac Rhythm: Normal sinus rhythm;Other (Comment) (04/09 0206) Resp:  [11-28] 23 (04/09 0716) BP: (133-180)/(71-100) 163/92 (04/09 0716) SpO2:  [94 %-98 %] 96 % (04/09 0716)  Hemodynamic parameters for last 24 hours:    Intake/Output from previous day: 04/08 0701 - 04/09 0700 In: 720 [P.O.:720] Out: 200 [Urine:200] Intake/Output this shift: Total I/O In: -  Out: 175 [Urine:175]  General appearance: alert, cooperative, distracted and confused Heart: regular rate and rhythm Lungs: clear to auscultation bilaterally Abdomen: benign Extremities: no edema Wound: incis healing well, no hip pain, nl ROM, small skin scrape left knee, no bleeding  Lab Results: No results for input(s): WBC, HGB, HCT, PLT in the last 72 hours. BMET:  Recent Labs    03/03/18 0352  NA 139  K 3.7  CL 104  CO2 24  GLUCOSE 106*  BUN 15  CREATININE 1.09  CALCIUM 9.3    PT/INR: No results for input(s): LABPROT, INR in the last 72 hours. ABG    Component Value Date/Time   PHART 7.379 02/26/2018 0504   HCO3 23.6 02/26/2018 0504   TCO2 24 02/26/2018 1640   ACIDBASEDEF 0.7 02/26/2018 0504   O2SAT 92.3 02/26/2018 0504   CBG (last 3)  No results for input(s): GLUCAP in the last 72 hours.  Meds Scheduled Meds: . aspirin EC  325 mg Oral Daily  . bisacodyl  10 mg Oral Daily   Or  . bisacodyl   10 mg Rectal Daily  . enoxaparin (LOVENOX) injection  30 mg Subcutaneous Q24H  . folic acid-pyridoxine-cyancobalamin  1 tablet Oral Daily  . furosemide  20 mg Oral Daily  . lisinopril  20 mg Oral Daily  . magnesium oxide  400 mg Oral Daily  . mouth rinse  15 mL Mouth Rinse QID  . metoprolol tartrate  12.5 mg Oral BID  . moving right along book   Does not apply Once  . pantoprazole  40 mg Oral Daily  . potassium chloride  20 mEq Oral Daily  . rosuvastatin  40 mg Oral q1800  . sorbitol  30 mL Oral Once   Continuous Infusions: . amiodarone Stopped (03/03/18 0205)   Followed by  . amiodarone    . amiodarone Stopped (03/03/18 0205)   PRN Meds:.metoprolol tartrate, traMADol  Xrays No results found.  Assessment/Plan: S/P Procedure(s) (LRB): CORONARY ARTERY BYPASS GRAFTING (CABG), ON PUMP, TIMES TWO, USING LEFT INTERNAL MAMMARY ARTERY AND ENDOSCOPICALLY HARVESTED RIGHT GREATER SAPHENOUS VEIN (N/A) TRANSESOPHAGEAL ECHOCARDIOGRAM (TEE) (N/A)  1 afib- short duration, now sinus 2 htn at times, will add low dose norvasc 3 fall- no apparent injury , monitor clinically- needs disposition that is safe 4 would consider d/c crestor in 77 year old with revasc and significant memory/dementia issues.     LOS: 7 days    Calvin Ramirez 03/03/2018  I have seen and examined the patient and agree with the assessment and plan as outlined.  Reportedly up and somewhat agitated overnight.  Delirium worse last night and this morning but reportedly ambulating well and ate well today.  Currently sleeping.  No further episodes Afib since early this morning.  Will arrange for bedside sitter for tonight due to high risk for fall.  Discuss issues at length with patient's wife at bedside.    Rexene Alberts, MD 03/03/2018 6:27 PM

## 2018-03-03 NOTE — Progress Notes (Signed)
Patient converted to [name] 64 before drip and bolus was started . Will hold off on Amiodarone Drip for now per Tim R.N.

## 2018-03-03 NOTE — Progress Notes (Signed)
   03/03/18 0730  What Happened  Was fall witnessed? Yes  Who witnessed fall? Wife  Patients activity before fall other (comment) (pt is confused, not witnessed by nursing staff)  Point of contact hip/leg  Was patient injured? No  Patient found on floor  Found by  (wife)  Stated prior activity to/from bed, chair, or stretcher  Follow Up  MD notified Jadene Pierini PA-C  Time MD notified [phone]  Family notified Yes-comment (wife in room)  Time family notified 0720  Progress note created (see row info) Yes  Adult Fall Risk Assessment  Risk Factor Category (scoring not indicated) High fall risk per protocol (document High fall risk)  Age 77  Fall History: Fall within 6 months prior to admission 0  Elimination; Bowel and/or Urine Incontinence 0  Elimination; Bowel and/or Urine Urgency/Frequency 0  Medications: includes PCA/Opiates, Anti-convulsants, Anti-hypertensives, Diuretics, Hypnotics, Laxatives, Sedatives, and Psychotropics 5  Patient Care Equipment 2  Mobility-Assistance 2  Mobility-Gait 0  Mobility-Sensory Deficit 0  Altered awareness of immediate physical environment 1  Impulsiveness 2  Lack of understanding of one's physical/cognitive limitations 4  Total Score 18  Adult Fall Risk Interventions  Required Bundle Interventions *See Row Information* High fall risk - low, moderate, and high requirements implemented  Additional Interventions Use of appropriate toileting equipment (bedpan, BSC, etc.);Room near nurses station  Screening for Fall Injury Risk (To be completed on HIGH fall risk patients) - Assessing Need for Low Bed  Risk For Fall Injury- Low Bed Criteria None identified - Continue screening  Screening for Fall Injury Risk (To be completed on HIGH fall risk patients who do not meet crieteria for Low Bed) - Assessing Need for Floor Mats Only  Risk For Fall Injury- Criteria for Floor Mats Confusion/dementia (+CAM, CIWA, TBI, etc.)  Will Implement Floor Mats Yes  Vitals   Temp 98.2 F (36.8 C)  Temp Source Oral  BP (!) 156/93  MAP (mmHg) 113  BP Location Left Arm  BP Method Automatic  Patient Position (if appropriate) Lying  Pulse Rate 71  Pulse Rate Source Monitor  ECG Heart Rate 75  Resp (!) 25  Oxygen Therapy  SpO2 95 %  O2 Device Room Air  Pain Assessment  Pain Scale 0-10  Pain Score 0  Neurological  Neuro (WDL) WDL  Level of Consciousness Alert  Orientation Level Oriented to person;Oriented to time;Disoriented to place;Oriented to situation  Cognition Poor attention/concentration;Poor judgement;Poor safety awareness;Memory impairment  Speech Clear  Pupil Assessment  No  Glasgow Coma Scale  Eye Opening 4  Best Motor Response 6  Best Verbal Response (NON-intubated) 5  Glasgow Coma Scale Score 15  Musculoskeletal  Musculoskeletal (WDL) X  Assistive Device Front wheel walker  Generalized Weakness Yes  Weight Bearing Restrictions Yes  RUE Weight Bearing  (sternal precautions)  LUE Weight Bearing  (sternal precautions)  Integumentary  Integumentary (WDL) X  Skin Color Appropriate for ethnicity  Skin Condition Dry;Itching (rash on upper back)  Itching intervention  (lotion)  Skin Integrity Surgical Incision (see LDA);Abrasion  Abrasion Location Knee  Abrasion Location Orientation Left  Abrasion Intervention Other (Comment) (assessed)  Rash Location Back  Rash Location Orientation Lower;Mid  Skin Turgor Non-tenting

## 2018-03-03 NOTE — Progress Notes (Signed)
Patient H.R.now ranging  Upper 90's to 120's At.FIb. R.N. Aware Dr. Cyndia Bent  called and made aware See orders to start Amiodarone Bolus and Drip per Protocol.

## 2018-03-03 NOTE — Progress Notes (Signed)
CARDIAC REHAB PHASE I   PRE:  Rate/Rhythm: 67 SR    BP: sitting 130/82    SaO2: 95 RA  MODE:  Ambulation: 640 ft   POST:  Rate/Rhythm: 90 SR    BP: sitting 128/70     SaO2: 97 RA  Pt moved out of bed and walked without AD. He looks exhausted today and seemed to have shorter stride. No LOB, fairly steady with min assist. He is without c/o but with distance he began coughing, like a "tickle" cough. This increased with distance and after sitting. We put him on cotton sheets due to rash on back. To recliner with chair alarm and family present. Will f/u tomorrow. [place], ACSM 03/03/2018 3:38 PM

## 2018-03-03 NOTE — Progress Notes (Signed)
  Amiodarone Drug - Drug Interaction Consult Note  Recommendations: Monitor HR, K+, and signs of statin toxicity.  Amiodarone is metabolized by the cytochrome P450 system and therefore has the potential to cause many drug interactions. Amiodarone has an average plasma half-life of 50 days (range 20 to 100 days).   There is potential for drug interactions to occur several weeks or months after stopping treatment and the onset of drug interactions may be slow after initiating amiodarone.   [x]  Statins: (PT ON ROSUVASTATIN)  Increased risk of myopathy. Simvastatin- restrict dose to 20mg  daily. Other statins: counsel patients to report any muscle pain or weakness immediately.  []  Anticoagulants: Amiodarone can increase anticoagulant effect. Consider warfarin dose reduction. Patients should be monitored closely and the dose of anticoagulant altered accordingly, remembering that amiodarone levels take several weeks to stabilize.  []  Antiepileptics: Amiodarone can increase plasma concentration of phenytoin, the dose should be reduced. Note that small changes in phenytoin dose can result in large changes in levels. Monitor patient and counsel on signs of toxicity.  [x]  Beta blockers: (PT ON LOPRESSOR)   increased risk of bradycardia, AV block and myocardial depression. Sotalol - avoid concomitant use.  []   Calcium channel blockers (diltiazem and verapamil): increased risk of bradycardia, AV block and myocardial depression.  []   Cyclosporine: Amiodarone increases levels of cyclosporine. Reduced dose of cyclosporine is recommended.  []  Digoxin dose should be halved when amiodarone is started.  [x]  Diuretics: (PT ON LASIX)   increased risk of cardiotoxicity if hypokalemia occurs.  []  Oral hypoglycemic agents (glyburide, glipizide, glimepiride): increased risk of hypoglycemia. Patient's glucose levels should be monitored closely when initiating amiodarone therapy.   []  Drugs that prolong the QT  interval:  Torsades de pointes risk may be increased with concurrent use - avoid if possible.  Monitor QTc, also keep magnesium/potassium WNL if concurrent therapy can't be avoided. Marland Kitchen Antibiotics: e.g. fluoroquinolones, erythromycin. . Antiarrhythmics: e.g. quinidine, procainamide, disopyramide, sotalol. . Antipsychotics: e.g. phenothiazines, haloperidol.  . Lithium, tricyclic antidepressants, and methadone.  Thank You,  Wynona Neat, PharmD, BCPS  03/03/2018 2:06 AM

## 2018-03-03 NOTE — Care Management Important Message (Signed)
Important Message  Patient Details  Name: Calvin Ramirez MRN: [id number] Date of Birth: [birth year]   Medicare Important Message Given:  Yes    Barb Merino Halia Franey 03/03/2018, 12:47 PM

## 2018-03-03 NOTE — Care Management Note (Signed)
Case Management Note Marvetta Gibbons RN, BSN Unit 4E-Case Manager-- [place] coverage [phone]  Patient Details  Name: Calvin Ramirez MRN: [id number] Date of Birth: [birth year]  Subjective/Objective:   Pt admitted s/p CABGx2                 Action/Plan: PTA pt lived at home with wife- CM to follow for transition of care needs.   Expected Discharge Date:                  Expected Discharge Plan:  [place]  In-House Referral:     Discharge planning Services  CM Consult  Post Acute Care Choice:  Durable Medical Equipment, Home Health Choice offered to:  Patient, Spouse  DME Arranged:  [facility] DME Agency:  [place] Arranged:  RN, Disease Management, PT [place] Agency:  [place]  Status of Service:  Completed, signed off  If discussed at [place] of Stay Meetings, dates discussed:    Discharge Disposition: home/home health   Additional Comments:  03/03/18- 1100- Ilynn Stauffer RN, CM- RW has been delivered to room for d/c- however pt has remained confused at night- wife has been looking into hiring private pay help for home- however has not been able to get this arranged yet- pt discussed in LLOS/QC mtg this am- deemed appropriate for [place] program- pt also may benefit from [facility] First program- have spoken with Tommi Rumps at [facility]- who will look over pt to see if pt will qualify- spoke with pt and wife at bedside- per wife main concern is having someone to assist her with pt on return home in order for her to "get some rest" so that she can care for pt at home- states she does not want to send pt to a SNF.  Update-1230- per Tommi Rumps with [facility]- pt is appropriate for [facility] program and he will come speak with wife about program and she if she is agreeable to program and the assistance it can provide for transition home. They would also be able to assist wife in finding help at home at pt transitions out of the program if that is still  needed. CM to f/u - will need HH orders for HHRN/PT/aide prior to discharge.   03/01/18- 1000- Reba Hulett RN, CM- noted DME order for RW- call made to [facility] with [facility] for DME needs- RW to be delivered to room prior to discharge.   Dawayne Patricia, RN 03/03/2018, 1:29 PM

## 2018-03-04 DIAGNOSIS — F0280 Dementia in other diseases classified elsewhere without behavioral disturbance: Secondary | ICD-10-CM | POA: Diagnosis not present

## 2018-03-04 DIAGNOSIS — Z48812 Encounter for surgical aftercare following surgery on the circulatory system: Secondary | ICD-10-CM | POA: Diagnosis not present

## 2018-03-04 DIAGNOSIS — I4891 Unspecified atrial fibrillation: Secondary | ICD-10-CM | POA: Diagnosis not present

## 2018-03-04 DIAGNOSIS — M160 Bilateral primary osteoarthritis of hip: Secondary | ICD-10-CM | POA: Diagnosis not present

## 2018-03-04 DIAGNOSIS — Z9489 Other transplanted organ and tissue status: Secondary | ICD-10-CM | POA: Diagnosis not present

## 2018-03-04 DIAGNOSIS — E785 Hyperlipidemia, unspecified: Secondary | ICD-10-CM | POA: Diagnosis not present

## 2018-03-04 DIAGNOSIS — Z7982 Long term (current) use of aspirin: Secondary | ICD-10-CM | POA: Diagnosis not present

## 2018-03-04 DIAGNOSIS — Z951 Presence of aortocoronary bypass graft: Secondary | ICD-10-CM | POA: Diagnosis not present

## 2018-03-04 DIAGNOSIS — I110 Hypertensive heart disease with heart failure: Secondary | ICD-10-CM | POA: Diagnosis not present

## 2018-03-04 DIAGNOSIS — I2510 Atherosclerotic heart disease of native coronary artery without angina pectoris: Secondary | ICD-10-CM | POA: Diagnosis not present

## 2018-03-04 DIAGNOSIS — G20 Parkinson's disease: Secondary | ICD-10-CM | POA: Diagnosis not present

## 2018-03-04 DIAGNOSIS — I5031 Acute diastolic (congestive) heart failure: Secondary | ICD-10-CM | POA: Diagnosis not present

## 2018-03-04 LAB — BASIC METABOLIC PANEL
Anion gap: 12 (ref 5–15)
BUN: 17 mg/dL (ref 6–20)
CO2: 25 mmol/L (ref 22–32)
Calcium: 9.1 mg/dL (ref 8.9–10.3)
Chloride: 102 mmol/L (ref 101–111)
Creatinine, Ser: 1.16 mg/dL (ref 0.61–1.24)
GFR calc Af Amer: >60 mL/min (ref >60)
GFR calc non Af Amer: 59 mL/min — ABNORMAL LOW (ref >60)
Glucose, Bld: 117 mg/dL — ABNORMAL HIGH (ref 65–99)
Potassium: 3.6 mmol/L (ref 3.5–5.1)
Sodium: 139 mmol/L (ref 135–145)

## 2018-03-04 MED ORDER — AMIODARONE HCL 200 MG PO TABS
200.0000 mg | ORAL_TABLET | Freq: Two times a day (BID) | ORAL | Status: DC
  Administered 2018-03-04: 200 mg via ORAL
  Filled 2018-03-04: qty 1

## 2018-03-04 MED ORDER — DIPHENHYDRAMINE-ZINC ACETATE 2-0.1 % EX CREA: TOPICAL_CREAM | Freq: Every day | CUTANEOUS | 0 refills | Status: DC | PRN

## 2018-03-04 MED ORDER — AMIODARONE HCL 200 MG PO TABS: 200.0000 mg | ORAL_TABLET | Freq: Two times a day (BID) | ORAL | 1 refills | Status: DC

## 2018-03-04 MED ORDER — LISINOPRIL 20 MG PO TABS: 20.0000 mg | ORAL_TABLET | Freq: Every day | ORAL | 1 refills | Status: DC

## 2018-03-04 MED ORDER — METOPROLOL TARTRATE 25 MG PO TABS: 25.0000 mg | ORAL_TABLET | Freq: Two times a day (BID) | ORAL | 1 refills | Status: DC

## 2018-03-04 MED ORDER — POTASSIUM CHLORIDE CRYS ER 20 MEQ PO TBCR
40.0000 meq | EXTENDED_RELEASE_TABLET | Freq: Once | ORAL | Status: AC
  Administered 2018-03-04: 40 meq via ORAL
  Filled 2018-03-04: qty 2

## 2018-03-04 MED ORDER — ASPIRIN 325 MG PO TBEC: 325.0000 mg | DELAYED_RELEASE_TABLET | Freq: Every day | ORAL | Status: DC

## 2018-03-04 NOTE — Progress Notes (Signed)
CT sutures removed and placed steri stripes. Pt tolerated well. Pola Corn, RN

## 2018-03-04 NOTE — Progress Notes (Signed)
Have confirmed with [facility] that pt will be followed by [facility] program and they will have aides available tonight for pt on discharge to assist wife- Pt will also be followed by [facility] program- Wife has spoken with Tommi Rumps from [place] in regards to [facility] arrangements. Pt will need orders placed for HHRN/PT/OT/aide.

## 2018-03-04 NOTE — Progress Notes (Signed)
Patient trying to get out of bed. H.R. for a few second up 140 At. Fib then back to S>R.

## 2018-03-04 NOTE — Progress Notes (Signed)
Reviewed and educated pt on d/c medications, Rx, follow-up appointments, incision care, instructions and when to call the MD. Answered all questions fully. Pt has all belongings, paperwork and Rx.  IV site and telemetry removed. D/C via wheelchair with family.  Pola Corn, RN

## 2018-03-04 NOTE — Progress Notes (Signed)
Ed completed with pt and wife. Voiced understanding. Already referred to [place] CES, ACSM 1:38 PM 03/04/2018

## 2018-03-04 NOTE — Care Management Note (Signed)
Case Management Note Marvetta Gibbons RN, BSN Unit 4E-Case Manager-- [place] coverage [phone]  Patient Details  Name: Calvin Ramirez MRN: [id number] Date of Birth: [birth year]  Subjective/Objective:   Pt admitted s/p CABGx2                 Action/Plan: PTA pt lived at home with wife- CM to follow for transition of care needs.   Expected Discharge Date:  03/04/18               Expected Discharge Plan:  [place]  In-House Referral:     Discharge planning Services  CM Consult  Post Acute Care Choice:  Durable Medical Equipment, Home Health Choice offered to:  Patient, Spouse  DME Arranged:  [place] [facility] DME Agency:  [place] Arranged:  RN, Disease Management, PT, Nurse's Aide [place] Agency:  [place]  Status of Service:  Completed, signed off  If discussed at [place] of Stay Meetings, dates discussed:    Discharge Disposition: home/home health   Additional Comments:  03/04/18- 1130- Marvetta Gibbons RN, CM- pt for d/c home with wife today- have confirmed with Tommi Rumps at [facility] that services have been arranged and will start this afternoon once pt home with wife- spoke with wife who confirms that she has spoken with Tommi Rumps at [facility] regarding [place] arrangements under [facility] program (pt will also be [place])- orders have been placed for HHRN/PT/OT/aide- and d/c order is in- plan to d/c home with once wife returns early afternoon. Cory with [facility] aware and is arranging needs for home this afternoon/evening when pt and wife arrive at home.   03/03/18- 1100- Margot Oriordan RN, CM- RW has been delivered to room for d/c- however pt has remained confused at night- wife has been looking into hiring private pay help for home- however has not been able to get this arranged yet- pt discussed in LLOS/QC mtg this am- deemed appropriate for [facility] program- pt also may benefit from Home First program- have spoken with Tommi Rumps at [facility]- who will look over  pt to see if pt will qualify- spoke with pt and wife at bedside- per wife main concern is having someone to assist her with pt on return home in order for her to "get some rest" so that she can care for pt at home- states she does not want to send pt to a SNF.  Update-1230- per Tommi Rumps with [facility]- pt is appropriate for [facility] program and he will come speak with wife about program and she if she is agreeable to program and the assistance it can provide for transition home. They would also be able to assist wife in finding help at home at pt transitions out of the program if that is still needed. CM to f/u - will need HH orders for HHRN/PT/aide prior to discharge.   03/01/18- 1000- Demaurion Dicioccio RN, CM- noted DME order for RW- call made to [facility] with [facility] for DME needs- RW to be delivered to room prior to discharge.   Dawayne Patricia, RN 03/04/2018, 12:39 PM

## 2018-03-04 NOTE — Progress Notes (Addendum)
[address]       [place], [address]             [phone]      7 Days Post-Op Procedure(s) (LRB): CORONARY ARTERY BYPASS GRAFTING (CABG), ON PUMP, TIMES TWO, USING LEFT INTERNAL MAMMARY ARTERY AND ENDOSCOPICALLY HARVESTED RIGHT GREATER SAPHENOUS VEIN (N/A) TRANSESOPHAGEAL ECHOCARDIOGRAM (TEE) (N/A) Subjective: Pretty confused this am Short burst of afib, now sinus with pac's  Objective: Vital signs in last 24 hours: Temp:  [98.2 F (36.8 C)-98.8 F (37.1 C)] 98.3 F (36.8 C) (04/10 0453) Pulse Rate:  [52-72] 52 (04/10 0547) Cardiac Rhythm: Atrial fibrillation (04/10 0600) Resp:  [16-32] 32 (04/10 0547) BP: (128-147)/(70-81) 139/71 (04/10 0453) SpO2:  [94 %-99 %] 96 % (04/10 0547) Weight:  [202 lb 1.6 oz (91.7 kg)] 202 lb 1.6 oz (91.7 kg) (04/10 0547)  Hemodynamic parameters for last 24 hours:    Intake/Output from previous day: 04/09 0701 - 04/10 0700 In: 1080 [P.O.:1080] Out: 950 [Urine:950] Intake/Output this shift: No intake/output data recorded.  General appearance: alert, cooperative and no distress Heart: irregularly irregular rhythm Lungs: clear to auscultation bilaterally Abdomen: benign Extremities: no edema Wound: incis healing well  Lab Results: No results for input(s): WBC, HGB, HCT, PLT in the last 72 hours. BMET:  Recent Labs    03/03/18 0352 03/04/18 0315  NA 139 139  K 3.7 3.6  CL 104 102  CO2 24 25  GLUCOSE 106* 117*  BUN 15 17  CREATININE 1.09 1.16  CALCIUM 9.3 9.1    PT/INR: No results for input(s): LABPROT, INR in the last 72 hours. ABG    Component Value Date/Time   PHART 7.379 02/26/2018 0504   HCO3 23.6 02/26/2018 0504   TCO2 24 02/26/2018 1640   ACIDBASEDEF 0.7 02/26/2018 0504   O2SAT 92.3 02/26/2018 0504   CBG (last 3)  No results for input(s): GLUCAP in the last 72 hours.  Meds Scheduled Meds: . aspirin EC  325 mg Oral Daily  . bisacodyl  10 mg Oral Daily   Or  . bisacodyl  10 mg Rectal  Daily  . enoxaparin (LOVENOX) injection  30 mg Subcutaneous Q24H  . folic acid-pyridoxine-cyancobalamin  1 tablet Oral Daily  . furosemide  20 mg Oral Daily  . lisinopril  20 mg Oral Daily  . magnesium oxide  400 mg Oral Daily  . mouth rinse  15 mL Mouth Rinse QID  . metoprolol tartrate  25 mg Oral BID  . moving right along book   Does not apply Once  . pantoprazole  40 mg Oral Daily  . potassium chloride  20 mEq Oral Daily  . sorbitol  30 mL Oral Once   Continuous Infusions: PRN Meds:.diphenhydrAMINE-zinc acetate, metoprolol tartrate, traMADol  Xrays No results found.  Assessment/Plan: S/P Procedure(s) (LRB): CORONARY ARTERY BYPASS GRAFTING (CABG), ON PUMP, TIMES TWO, USING LEFT INTERNAL MAMMARY ARTERY AND ENDOSCOPICALLY HARVESTED RIGHT GREATER SAPHENOUS VEIN (N/A) TRANSESOPHAGEAL ECHOCARDIOGRAM (TEE) (N/A)  1 conts steady physical recovery, delerium/dementia cont to be a safety issue, wife is attempting to get full time help 2 afib, PAC's - will cont to replace K+/Mg++, may require additional agent. HTN is improved 3 wt below preop, I think we can stop diuretic for now 4 renal fxn normal 5 cont routine pulm toilet  LOS: 8 days    Calvin Ramirez 03/04/2018   I have seen and examined the patient and agree with the assessment and plan as  outlined.  Will add oral amiodarone.  I do not feel that Mr Daws should be put on warfarin or DOAC due to very high fall risk and likelihood that post op Afib will resolve soon.  Patient's wife is looking into making satisfactory arrangements for his care at home - she has been contacted by [facility] and is hopeful that Mr Artola might be able to go home later this afternoon.  Rexene Alberts, MD 03/04/2018 9:21 AM

## 2018-03-05 ENCOUNTER — Telehealth (HOSPITAL_COMMUNITY): Payer: Self-pay

## 2018-03-05 ENCOUNTER — Other Ambulatory Visit: Payer: Self-pay | Admitting: *Deleted

## 2018-03-05 DIAGNOSIS — I2510 Atherosclerotic heart disease of native coronary artery without angina pectoris: Secondary | ICD-10-CM | POA: Diagnosis not present

## 2018-03-05 DIAGNOSIS — Z48812 Encounter for surgical aftercare following surgery on the circulatory system: Secondary | ICD-10-CM | POA: Diagnosis not present

## 2018-03-05 NOTE — Telephone Encounter (Signed)
Attempted to call patient to see if interested in the [facility] program - lm on vm

## 2018-03-05 NOTE — Patient Outreach (Addendum)
Made aware by Calvin Ramirez with [facility] that Mr. Calvin Ramirez enrolled in the Calvin Ramirez program. He discharged home with Calvin Ramirez on 03/04/18.  Telephone call made and spoke with Calvin Ramirez to make aware that Calvin Ramirez could potentially assist if pharmacy or transportation needs are identified while Calvin Ramirez is on Calvin Ramirez. Calvin Ramirez expresses understanding.   Will send notification to Calvin Ramirez office to make aware of Calvin Ramirez's enrollment with Calvin Ramirez, [place], Calvin Ramirez,Calvin Ramirez Calvin Ramirez Liaison [phone]

## 2018-03-05 NOTE — Telephone Encounter (Signed)
Patients insurance is active and benefits verified through Medicare A/B - No co-pay, deductible amount of $185.00/$185.00 has been met, no out of pocket, 20% co-insurance, and no pre-authorization is required. Passport/reference [phone]  Patients insurance is active and benefits verified through [place] - No co-pay, no deductible, no out of pocket, no co-insurance, and no pre-authorization is required. Passport/reference [phone]  Will contact patient to see if interested in the Cardiac Rehab Program. If interested, patient will need to complete follow up appt. Once completed, patient will be contacted for scheduling upon review by the RN Navigator.

## 2018-03-09 DIAGNOSIS — D62 Acute posthemorrhagic anemia: Secondary | ICD-10-CM | POA: Diagnosis not present

## 2018-03-09 DIAGNOSIS — I10 Essential (primary) hypertension: Secondary | ICD-10-CM | POA: Diagnosis not present

## 2018-03-09 DIAGNOSIS — E7800 Pure hypercholesterolemia, unspecified: Secondary | ICD-10-CM | POA: Diagnosis not present

## 2018-03-09 DIAGNOSIS — R05 Cough: Secondary | ICD-10-CM | POA: Diagnosis not present

## 2018-03-09 DIAGNOSIS — I2510 Atherosclerotic heart disease of native coronary artery without angina pectoris: Secondary | ICD-10-CM | POA: Diagnosis not present

## 2018-03-10 ENCOUNTER — Emergency Department (HOSPITAL_COMMUNITY): Payer: Medicare Other

## 2018-03-10 ENCOUNTER — Emergency Department (HOSPITAL_COMMUNITY)
Admission: EM | Admit: 2018-03-10 | Discharge: 2018-03-10 | Disposition: A | Discharge status: Home / Self Care | Payer: Medicare Other | Attending: Emergency Medicine | Admitting: Emergency Medicine

## 2018-03-10 DIAGNOSIS — I2510 Atherosclerotic heart disease of native coronary artery without angina pectoris: Secondary | ICD-10-CM | POA: Diagnosis not present

## 2018-03-10 DIAGNOSIS — F0390 Unspecified dementia without behavioral disturbance: Secondary | ICD-10-CM | POA: Insufficient documentation

## 2018-03-10 DIAGNOSIS — I10 Essential (primary) hypertension: Secondary | ICD-10-CM | POA: Diagnosis not present

## 2018-03-10 DIAGNOSIS — F05 Delirium due to known physiological condition: Secondary | ICD-10-CM

## 2018-03-10 DIAGNOSIS — R443 Hallucinations, unspecified: Secondary | ICD-10-CM | POA: Diagnosis not present

## 2018-03-10 DIAGNOSIS — Z79899 Other long term (current) drug therapy: Secondary | ICD-10-CM | POA: Insufficient documentation

## 2018-03-10 DIAGNOSIS — J9811 Atelectasis: Secondary | ICD-10-CM | POA: Diagnosis not present

## 2018-03-10 DIAGNOSIS — R410 Disorientation, unspecified: Secondary | ICD-10-CM | POA: Diagnosis not present

## 2018-03-10 DIAGNOSIS — R4182 Altered mental status, unspecified: Secondary | ICD-10-CM | POA: Diagnosis not present

## 2018-03-10 DIAGNOSIS — Z7982 Long term (current) use of aspirin: Secondary | ICD-10-CM | POA: Diagnosis not present

## 2018-03-10 DIAGNOSIS — I444 Left anterior fascicular block: Secondary | ICD-10-CM | POA: Diagnosis not present

## 2018-03-10 LAB — CBC WITH DIFFERENTIAL/PLATELET
Basophils Absolute: 0 10*3/uL (ref 0.0–0.1)
Basophils Relative: 0 %
Eosinophils Absolute: 0.2 10*3/uL (ref 0.0–0.7)
Eosinophils Relative: 3 %
HCT: 34.7 % — ABNORMAL LOW (ref 39.0–52.0)
Hemoglobin: 11.5 g/dL — ABNORMAL LOW (ref 13.0–17.0)
Lymphocytes Relative: 17 %
Lymphs Abs: 1.5 10*3/uL (ref 0.7–4.0)
MCH: 30.4 pg (ref 26.0–34.0)
MCHC: 33.1 g/dL (ref 30.0–36.0)
MCV: 91.8 fL (ref 78.0–100.0)
Monocytes Absolute: 0.4 10*3/uL (ref 0.1–1.0)
Monocytes Relative: 5 %
Neutro Abs: 6.3 10*3/uL (ref 1.7–7.7)
Neutrophils Relative %: 75 %
Platelets: 407 10*3/uL — ABNORMAL HIGH (ref 150–400)
RBC: 3.78 MIL/uL — ABNORMAL LOW (ref 4.22–5.81)
RDW: 13.5 % (ref 11.5–15.5)
WBC: 8.5 10*3/uL (ref 4.0–10.5)

## 2018-03-10 LAB — I-STAT CHEM 8, ED
BUN: 30 mg/dL — ABNORMAL HIGH (ref 6–20)
Calcium, Ion: 1.16 mmol/L (ref 1.15–1.40)
Chloride: 103 mmol/L (ref 101–111)
Creatinine, Ser: 1.3 mg/dL — ABNORMAL HIGH (ref 0.61–1.24)
Glucose, Bld: 113 mg/dL — ABNORMAL HIGH (ref 65–99)
HCT: 34 % — ABNORMAL LOW (ref 39.0–52.0)
Hemoglobin: 11.6 g/dL — ABNORMAL LOW (ref 13.0–17.0)
Potassium: 4.1 mmol/L (ref 3.5–5.1)
Sodium: 139 mmol/L (ref 135–145)
TCO2: 23 mmol/L (ref 22–32)

## 2018-03-10 LAB — URINALYSIS, ROUTINE W REFLEX MICROSCOPIC
Bilirubin Urine: NEGATIVE
Glucose, UA: NEGATIVE mg/dL
Hgb urine dipstick: NEGATIVE
Ketones, ur: NEGATIVE mg/dL
Leukocytes, UA: NEGATIVE
Nitrite: NEGATIVE
Protein, ur: NEGATIVE mg/dL
Specific Gravity, Urine: 1.013 (ref 1.005–1.030)
pH: 5 (ref 5.0–8.0)

## 2018-03-10 LAB — COMPREHENSIVE METABOLIC PANEL
ALT: 32 U/L (ref 17–63)
AST: 27 U/L (ref 15–41)
Albumin: 3.6 g/dL (ref 3.5–5.0)
Alkaline Phosphatase: 68 U/L (ref 38–126)
Anion gap: 12 (ref 5–15)
BUN: 31 mg/dL — ABNORMAL HIGH (ref 6–20)
CO2: 23 mmol/L (ref 22–32)
Calcium: 9.6 mg/dL (ref 8.9–10.3)
Chloride: 103 mmol/L (ref 101–111)
Creatinine, Ser: 1.35 mg/dL — ABNORMAL HIGH (ref 0.61–1.24)
GFR calc Af Amer: 57 mL/min — ABNORMAL LOW (ref >60)
GFR calc non Af Amer: 49 mL/min — ABNORMAL LOW (ref >60)
Glucose, Bld: 110 mg/dL — ABNORMAL HIGH (ref 65–99)
Potassium: 4.2 mmol/L (ref 3.5–5.1)
Sodium: 138 mmol/L (ref 135–145)
Total Bilirubin: 0.7 mg/dL (ref 0.3–1.2)
Total Protein: 6.6 g/dL (ref 6.5–8.1)

## 2018-03-10 LAB — I-STAT CG4 LACTIC ACID, ED: Lactic Acid, Venous: 1.16 mmol/L (ref 0.5–1.9)

## 2018-03-10 NOTE — ED Provider Notes (Signed)
[facility] EMERGENCY DEPARTMENT Provider Note   CSN: [id number] Arrival date & time: 03/10/18  0433     History   Chief Complaint Chief Complaint  Patient presents with  . Altered Mental Status    HPI Calvin Ramirez is a 77 y.o. male who presents the emergency department for altered mental status.  He is status post CABG on 02/25/2018.  His wife states that after his surgery he had significant delirium in the hospital however it resolved and he was "at least 80% better" when they came home 4 days ago.  The patient was doing very well for the first 2 days.  Over the last 2 days the patient's mental status declined and he has been progressively worsening.  His wife states that last night he ripped the sheets off of the bed twice.  The patient states that he thought he was trapped in a never ending loop of sheets.  He states that.  He did the same this morning and he thought that "the scene had no credibility."  He called 911 and asked for an officer to present.  His wife states that he checked the officers badge and then asked for EMS to arrive.  She states that he has been extremely paranoid and hallucinating.  She also notes that he has had urinary frequency at home.  According to the patient's discharge summary by PA GOLD :"The patient has a significant chronic dementia and has had a lot of postoperative confusion.  Arrangements are being made for home care to assist with this matter as his acute delirium component has not reached baseline.  He was ambulating on room air. He has been tolerating a diet and has had a bowel movement.   He has had some short episodes of atrial fibrillation with rapid ventricular response and has been started on p.o. amiodarone.  Due to his significant dementia he is not felt to be a candidate for anticoagulation therapy."     HPI  Past Medical History:  Diagnosis Date  . Arthritis   . Coronary artery disease   . Essential hypertension  04/21/2014  . Hyperlipidemia   . Parkinson's disease ([place]) 04/27/2015   2016   . 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 Stented coronary artery 2011   x3    Patient Active Problem List   Diagnosis Date Noted  . 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 CAD (coronary artery disease) 02/24/2018  . Dementia   . Acute diastolic (congestive) heart failure ([place]) 02/13/2018  . 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 Parkinson's disease ([place]) 04/27/2015  . CAD in native artery 04/26/2015  . Essential hypertension   . ABSCESS, SKIN 06/27/2008  . Hyperlipidemia   . ACUTE SINUSITIS, UNSPECIFIED 11/24/2007  . CONJUNCTIVITIS, ACUTE NOS 05/06/2007    Past Surgical History:  Procedure Laterality Date  . APPENDECTOMY    . CARDIAC CATHETERIZATION  2011   3 stents  . COLONOSCOPY    . CORONARY ARTERY BYPASS GRAFT N/A 02/25/2018   Procedure: CORONARY ARTERY BYPASS GRAFTING (CABG), ON PUMP, TIMES TWO, USING LEFT INTERNAL MAMMARY ARTERY AND ENDOSCOPICALLY HARVESTED RIGHT GREATER SAPHENOUS VEIN;  Surgeon: Rexene Alberts, MD;  Location: [facility];  Service: Open Heart Surgery;  Laterality: N/A;  . RIGHT/LEFT HEART CATH AND CORONARY ANGIOGRAPHY N/A 02/24/2018   Procedure: RIGHT/LEFT HEART CATH AND CORONARY ANGIOGRAPHY;  Surgeon: Belva Crome, MD;  Location: [facility];  Service: Cardiovascular;  Laterality: N/A;  . TEE WITHOUT CARDIOVERSION N/A 02/25/2018   Procedure: TRANSESOPHAGEAL ECHOCARDIOGRAM (TEE);  Surgeon: Rexene Alberts, MD;  Location: [facility];  Service: Open Heart Surgery;  Laterality: N/A;  . TONSILLECTOMY          Home Medications    Prior to Admission medications   Medication Sig Start Date End Date Taking? Authorizing Provider  amiodarone (PACERONE) 200 MG tablet Take 1 tablet (200 mg total) by mouth 2 (two) times daily after a meal. 03/04/18  Yes Gold, Wilder Glade, PA-C  aspirin EC 325 MG tablet Take 325 mg by mouth daily.   Yes [provider]   diphenhydrAMINE-zinc acetate (BENADRYL) cream Apply topically daily as needed for itching. 03/04/18  Yes Gold, Wayne E, PA-C  lisinopril (PRINIVIL,ZESTRIL) 20 MG tablet Take 1 tablet (20 mg total) by mouth daily. 03/05/18  Yes Gold, Wayne E, PA-C  metoprolol tartrate (LOPRESSOR) 25 MG tablet Take 1 tablet (25 mg total) by mouth 2 (two) times daily. 03/04/18  Yes John Giovanni, PA-C    Family History Family History  Problem Relation Age of Onset  . Heart attack Mother   . Cancer Father     Social History Social History   Tobacco Use  . Smoking status: Never Smoker  . Smokeless tobacco: Never Used  Substance Use Topics  . Alcohol use: Yes    Alcohol/week: 0.0 oz    Comment: occ  . Drug use: No     Allergies   Levaquin [levofloxacin hemihydrate] and Penicillins   Review of Systems Review of Systems  Ten systems reviewed and are negative for acute change, except as noted in the HPI.   Physical Exam Updated Vital Signs BP 111/66 (BP Location: Right Arm)   Pulse 61   Temp 98 F (36.7 C) (Oral)   Resp 19   SpO2 97%   Physical Exam  Constitutional: He appears well-developed and well-nourished. No distress.  HENT:  Head: Normocephalic and atraumatic.  Eyes: Conjunctivae are normal. No scleral icterus.  Neck: Normal range of motion. Neck supple.  Cardiovascular: Normal rate, regular rhythm and normal heart sounds.  Pulmonary/Chest: Effort normal and breath sounds normal. No respiratory distress.  Abdominal: Soft. There is no tenderness.  Musculoskeletal: He exhibits no edema.  Well healing Incision sites over the sternum and the R medial thigh  Neurological: He is alert. He displays normal reflexes. No cranial nerve deficit or sensory deficit. He exhibits normal muscle tone. Coordination normal.  Skin: Skin is warm and dry. He is not diaphoretic.  Psychiatric: His behavior is normal.  Nursing note and vitals reviewed.    ED Treatments / Results  Labs (all labs  ordered are listed, but only abnormal results are displayed) Labs Reviewed  CBC WITH DIFFERENTIAL/PLATELET - Abnormal; Notable for the following components:      Result Value   RBC 3.78 (*)    Hemoglobin 11.5 (*)    HCT 34.7 (*)    Platelets 407 (*)    All other components within normal limits  COMPREHENSIVE METABOLIC PANEL - Abnormal; Notable for the following components:   Glucose, Bld 110 (*)    BUN 31 (*)    Creatinine, Ser 1.35 (*)    GFR calc non Af Amer 49 (*)    GFR calc Af Amer 57 (*)    All other components within normal limits  I-STAT CHEM 8, ED - Abnormal; Notable for the following  components:   BUN 30 (*)    Creatinine, Ser 1.30 (*)    Glucose, Bld 113 (*)    Hemoglobin 11.6 (*)    HCT 34.0 (*)    All other components within normal limits  URINALYSIS, ROUTINE W REFLEX MICROSCOPIC  I-STAT CG4 LACTIC ACID, ED  CBG MONITORING, ED    EKG EKG Interpretation  Date/Time:  Tuesday March 10 2018 04:54:07 EDT Ventricular Rate:  61 PR Interval:    QRS Duration: 96 QT Interval:  468 QTC Calculation: 472 R Axis:   -57 Text Interpretation:  Sinus rhythm LAD, consider left anterior fascicular block atrial fibrillation resolved since last tracing Confirmed by Dorie Rank [phone]) on 03/10/2018 7:11:20 AM   Radiology Ct Head Wo Contrast  Result Date: 03/10/2018 CLINICAL DATA:  Altered mental status, weakness since open heart surgery February 25, 2018. History of Parkinson's disease, hypertension, hyperlipidemia. EXAM: CT HEAD WITHOUT CONTRAST TECHNIQUE: Contiguous axial images were obtained from the base of the skull through the vertex without intravenous contrast. COMPARISON:  None. FINDINGS: BRAIN: No intraparenchymal hemorrhage, mass effect nor midline shift. Moderate parenchymal brain volume loss. No hydrocephalus. Minimal supratentorial white matter hypodensities less than expected for patient's age, though non-specific are most compatible with chronic small vessel ischemic  disease. No acute large vascular territory infarcts. No abnormal extra-axial fluid collections. Basal cisterns are patent. VASCULAR: Mild calcific atherosclerosis of the carotid siphons. SKULL: No skull fracture. No significant scalp soft tissue swelling. SINUSES/ORBITS: The mastoid air-cells and included paranasal sinuses are well-aerated.The included ocular globes and orbital contents are non-suspicious. Status post bilateral ocular lens implants. OTHER: None. IMPRESSION: Negative noncontrast CT HEAD for age. Electronically Signed   By: Elon Alas M.D.   On: 03/10/2018 06:00   Dg Chest Port 1 View  Result Date: 03/10/2018 CLINICAL DATA:  Acute onset of altered mental status. EXAM: PORTABLE CHEST 1 VIEW COMPARISON:  Chest radiograph performed 02/28/2018 FINDINGS: The lungs are well-aerated. Atelectasis is noted at the right midlung. There is no evidence of pleural effusion or pneumothorax. The cardiomediastinal silhouette is borderline normal in size. The patient is status post median sternotomy, with evidence of prior CABG. No acute osseous abnormalities are seen. IMPRESSION: Mild right midlung atelectasis.  Lungs otherwise grossly clear. Electronically Signed   By: Garald Balding M.D.   On: 03/10/2018 06:17    Procedures Procedures (including critical care time)  Medications Ordered in ED Medications - No data to display   Initial Impression / Assessment and Plan / ED Course  I have reviewed the triage vital signs and the nursing notes.  Pertinent labs & imaging results that were available during my care of the patient were reviewed by me and considered in my medical decision making (see chart for details).  Clinical Course as of Mar 10 748  Tue Mar 10, 2018  0732 Patient with acute delirium vs worsening dementia/sundowning vs Post cardiac Surgery delirium. Work up shows no significant abnormalities. No evidence of urinary tract infection.  EKG reviewed and unremarkable. CT head and  chest without sig abnormality   [AH]  [phone] Patient is improved in the interim of his visit and the patient want to go home. His wife is requesting this as well. The Symptoms are only occurring during the night. I suspect Sundowning. The patient has been seen by Dr. Tomi Bamberger and he feels the same. We have discussed the issue of sundowning and the patient is advised to follow closely with his pcp. The patient's work up  is negative and I do not feel his delirium is secondary to infection.   [AH]    Clinical Course User Index [AH] Margarita Mail, PA-C     Final Clinical Impressions(s) / ED Diagnoses   Final diagnoses:  [place]    ED Discharge Orders    None       Margarita Mail, PA-C 03/10/18 3014    Dorie Rank, MD 03/10/18 [phone]

## 2018-03-10 NOTE — ED Triage Notes (Signed)
Family member complained of patient being altered. Pt oriented x3. Disoriented to time. Family member states since pt had surgery, pt has been saying random things. Pt stated " his grandparents were coming to kidnap him."

## 2018-03-10 NOTE — ED Notes (Signed)
Pt able to follow commands, answer question about medical hx. Disoriented to date. Post CABG closed incision on chest. Dry, no drainage. Pt afebrile

## 2018-03-10 NOTE — ED Notes (Signed)
Pt stable, ambulatory, and verbalizes understanding of d/c instructions.  

## 2018-03-10 NOTE — ED Provider Notes (Signed)
MSE was initiated and I personally evaluated the patient and placed orders (if any) at  5:29 AM on March 10, 4665.   77 year old male with a history of severe CAD s/p CABG x 2 on 02/25/18 by Dr. Roxy Manns, diastolic CHF, HTN, NLD, bilateral iliac AA, Parkinson's presents to the emergency department for altered mental status.  He has had 2 occasions recently of hallucinations, feeling as though he is being kidnapped.  Wife states that he did take Tylenol PM prior to sleeping as he was experiencing some mild insomnia.  He was awoken by a sensation of angst tonight and called 9-1-1 to "make sure I wasn't dreaming".   He reports that he has had no pain since hospital discharge following his CABG.  He was weaned off of all narcotic medications prior to hospital discharge 10 days ago.  He denies any associated chest pain, shortness of breath, headache, nausea, vomiting, vision changes, tinnitus or hearing loss.  He has had some slight discomfort at the beginning of voiding, but no other urinary symptoms.  He has remained compliant with his prescribed medications.  Physical Exam  Constitutional: He is oriented to person, place, and time and well-developed, well-nourished, and in no distress.  Slightly pale appearing. Anxious.  HENT:  Head: Normocephalic and atraumatic.  Eyes: Conjunctivae and EOM are normal.  Cardiovascular: Normal rate, regular rhythm and intact distal pulses.  Pulmonary/Chest: Effort normal and breath sounds normal. No respiratory distress. He has no wheezes. He has no rales.  Well healed sternotomy scar without erythema, drainage, induration  Abdominal: Soft. He exhibits no distension. There is no tenderness. There is no rebound.  Soft, nontender, nondistended abdomen.  Musculoskeletal: He exhibits no edema.  Well healed site of EVH to the RLE. No erythema, induration, drainage; mild bruising in various healing stages.  Neurological: He is alert and oriented to person, place, and time.  Coordination normal.  GCS 15. Speech is clear and goal oriented. Moving all extremities spontaneously. Slight tremor to LUE, suspected chronic 2/2 Parkinson's. No focal deficits noted.  Skin: Skin is warm and dry. No rash noted. He is not diaphoretic.  Nursing note and vitals reviewed.  A/P: Will plan to initiate labs, EKG, head CT, port CXR. Patient agreeable to plan and verbalizes understanding of work up. Question influence of Tylenol PM in onset of these hallucinations. Wife reports similar episodes while patient was on narcotics during his hospitalization; this was a motivating factor in their discontinuation.   The patient appears stable so that the remainder of the MSE may be completed by another provider.   Antonietta Breach, PA-C 03/10/18 [place], April, MD 03/10/18 [phone]

## 2018-03-10 NOTE — Discharge Instructions (Addendum)
I have given you a handout on sundowning from the Alzheimer[employer] association. This is not to suggest a diagnosis of alzheimer's but does apply to all forms of dementia. Please follow closely with your primary care doctor to discuss car options.

## 2018-03-13 DIAGNOSIS — I2510 Atherosclerotic heart disease of native coronary artery without angina pectoris: Secondary | ICD-10-CM | POA: Diagnosis not present

## 2018-03-13 DIAGNOSIS — Z48812 Encounter for surgical aftercare following surgery on the circulatory system: Secondary | ICD-10-CM | POA: Diagnosis not present

## 2018-03-16 DIAGNOSIS — Z48812 Encounter for surgical aftercare following surgery on the circulatory system: Secondary | ICD-10-CM | POA: Diagnosis not present

## 2018-03-16 DIAGNOSIS — I2510 Atherosclerotic heart disease of native coronary artery without angina pectoris: Secondary | ICD-10-CM | POA: Diagnosis not present

## 2018-03-16 NOTE — Progress Notes (Signed)
Cardiology Office Note:    Date:  03/17/2018   ID:  Calvin Ramirez, DOB 28-Apr-1941, MRN [id number]  PCP:  Shirline Frees, MD  Cardiologist:  Sinclair Grooms, MD  Referring MD: Shirline Frees, MD   Chief Complaint  Patient presents with  . Follow-up    CABG    History of Present Illness:    Calvin Ramirez is a 77 y.o. male with a past medical history significant for CAD with prior DES to LAD, RCA and PL branch of RCA, diastolic dysfunction with pulmonary edema that led to discovery of MV CAD and underwent CABG X 2 on 02/25/18. Also has hx of HTN, HLD, bilateral iliac artery aneurysm, chronic dementia.   On 02/25/18 the patient underwent CABG X 2 with LIMA to the LAD and SVG to second OM of the left circumflex coronary artery. He had significant delirium postoperatively in the hospital but had improved about "80%" per his wife by discharge. About 2 days later his mental status decline with paranoia and hallucinations and he presented to the ED on 03/10/18.  He was not found to have any significant abnormalities on workup and no UTI. CT head and chest showed no significant abnormality. His delerium was not suspected to be due to infection.  He was suspected to be Sundowning.  Duringhis CABG postoperative period he e also had short episodes of afib with RVR and was started on amiodarone. Due to his significant dementia he was not felt to e a candidate for anticoagulation therapy.   Mr. Sole is here today with his wife for follow up. He is walking outside about 100 feet to the mail box and back several times per day and working with home health. He has had no exertional anginal type chest discomfort or dyspnea. He has mild chest soreness related to the surgery. He has had no orthopnea, PND, edema. No palpitations, lightheadedness, dizziness, near syncope or syncope.  He has an occasional non-productive cough slightly worse with activity. He has not been using his IS because it needs  cleaning and it causes him to cough. He has had no further problems with confusion or hallucinations. His wife says that he has really done well increasing his activity in the last week.    Past Medical History:  Diagnosis Date  . Arthritis   . Coronary artery disease   . Essential hypertension 04/21/2014  . Hyperlipidemia   . Parkinson's disease ([place]) 04/27/2015   2016   . 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 Stented coronary artery 2011   x3    Past Surgical History:  Procedure Laterality Date  . APPENDECTOMY    . CARDIAC CATHETERIZATION  2011   3 stents  . COLONOSCOPY    . CORONARY ARTERY BYPASS GRAFT N/A 02/25/2018   Procedure: CORONARY ARTERY BYPASS GRAFTING (CABG), ON PUMP, TIMES TWO, USING LEFT INTERNAL MAMMARY ARTERY AND ENDOSCOPICALLY HARVESTED RIGHT GREATER SAPHENOUS VEIN;  Surgeon: Rexene Alberts, MD;  Location: [facility];  Service: Open Heart Surgery;  Laterality: N/A;  . RIGHT/LEFT HEART CATH AND CORONARY ANGIOGRAPHY N/A 02/24/2018   Procedure: RIGHT/LEFT HEART CATH AND CORONARY ANGIOGRAPHY;  Surgeon: Belva Crome, MD;  Location: [facility];  Service: Cardiovascular;  Laterality: N/A;  . TEE WITHOUT CARDIOVERSION N/A 02/25/2018   Procedure: TRANSESOPHAGEAL ECHOCARDIOGRAM (TEE);  Surgeon: Rexene Alberts, MD;  Location: [facility];  Service: Open Heart Surgery;  Laterality: N/A;  . TONSILLECTOMY      Current Medications: Current Meds  Medication Sig  . amiodarone (PACERONE) 200 MG tablet Take 1 tablet (200 mg total) by mouth daily for 17 days. Stop taking on 04/03/18  . aspirin EC 325 MG tablet Take 325 mg by mouth daily.  . diphenhydrAMINE-zinc acetate (BENADRYL) cream Apply topically daily as needed for itching.  Marland Kitchen lisinopril (PRINIVIL,ZESTRIL) 20 MG tablet Take 1 tablet (20 mg total) by mouth daily.  . Melatonin 5 MG CAPS Take 1 capsule by mouth as needed (sleep).  . metoprolol tartrate (LOPRESSOR) 25 MG tablet Take 0.5 tablets (12.5  mg total) by mouth 2 (two) times daily.  . [DISCONTINUED] metoprolol tartrate (LOPRESSOR) 25 MG tablet Take 1 tablet (25 mg total) by mouth 2 (two) times daily.     Allergies:   Levaquin [levofloxacin hemihydrate] and Penicillins   Social History   Socioeconomic History  . Marital status: Married    Spouse name: Not on file  . Number of children: Not on file  . Years of education: Not on file  . Highest education level: Not on file  Occupational History  . Not on file  Social Needs  . Financial resource strain: Not on file  . Food insecurity:    Worry: Not on file    Inability: Not on file  . Transportation needs:    Medical: Not on file    Non-medical: Not on file  Tobacco Use  . Smoking status: Never Smoker  . Smokeless tobacco: Never Used  Substance and Sexual Activity  . Alcohol use: Yes    Alcohol/week: 0.0 oz    Comment: occ  . Drug use: No  . Sexual activity: Not on file  Lifestyle  . Physical activity:    Days per week: Not on file    Minutes per session: Not on file  . Stress: Not on file  Relationships  . Social connections:    Talks on phone: Not on file    Gets together: Not on file    Attends religious service: Not on file    Active member of club or organization: Not on file    Attends meetings of clubs or organizations: Not on file    Relationship status: Not on file  Other Topics Concern  . Not on file  Social History Narrative  . Not on file     Family History: The patient's family history includes Cancer in his father; Heart attack in his mother. ROS:   Please see the history of present illness.     All other systems reviewed and are negative.  EKGs/Labs/Other Studies Reviewed:    The following studies were reviewed today:  RIGHT/LEFT HEART CATH AND CORONARY ANGIOGRAPHY  02/24/18  Conclusion   Critical distal left main coronary artery disease and the culprit for the patient developing flash pulmonary edema 2 weeks ago at his grandsons  wedding in [place].  Syntax score less than 22.  Patent proximal LAD stent with eccentric 50-70% stenosis beyond the stent distal margin in the mid LAD.  Widely patent circumflex  Widely patent right coronary including a distal RCA and continuation RCA stents that are widely patent.  A relatively small PDA contains 70% ostial narrowing.  Normal right heart pressures.  Normal left ventricular systolic function with normal LVEDP.  Estimated ejection fraction 55%.  Echo performed in [place] 2 weeks ago confirms normal LV function.  RECOMMENDATIONS:  Heart team approach with TCTS.  He has  severe distal left main disease and would probably fare better over the long-term with coronary bypass grafting.  The Syntax score is relatively low and stenting could be done if for some reason he is not a surgical candidate.  Due to the unpredictability and the patient's clinical course with recent flash pulmonary edema, will hospitalize, anticoagulate, and manage further after heart team approach once best decision for treatment is determined.    EKG:  EKG is ordered today.  The ekg ordered today demonstrates sinus bradycardia with 1st degree AV block (PRI 0.212), QTC 441.   Recent Labs: 02/17/2018: NT-Pro BNP 548 02/26/2018: Magnesium 2.2 03/10/2018: ALT 32; BUN 30; Creatinine, Ser 1.30; Hemoglobin 11.6; Platelets 407; Potassium 4.1; Sodium 139   Recent Lipid Panel    Component Value Date/Time   CHOL 160 11/21/2017 0912   TRIG 208 (H) 11/21/2017 0912   HDL 43 11/21/2017 0912   CHOLHDL 3.7 11/21/2017 0912   CHOLHDL 6.2 (H) 09/06/2016 0952   VLDL 57 (H) 09/06/2016 0952   LDLCALC 75 11/21/2017 0912   LDLDIRECT 98.0 04/21/2015 0858    Physical Exam:    VS:  BP (!) 122/40   Pulse (!) 58   Ht 5\' 10"  (1.778 m)   Wt 200 lb (90.7 kg)   SpO2 97%   BMI 28.70 kg/m     Wt Readings from Last 3 Encounters:  03/17/18 200 lb (90.7 kg)  03/04/18 202 lb 1.6 oz (91.7 kg)  02/17/18 217 lb 3.2 oz (98.5 kg)       Physical Exam  Constitutional: He is oriented to person, place, and time. He appears well-developed and well-nourished. No distress.  HENT:  Head: Normocephalic and atraumatic.  Neck: Normal range of motion. Neck supple. No JVD present.  Cardiovascular: Normal rate, regular rhythm, normal heart sounds and intact distal pulses. Exam reveals no gallop and no friction rub.  No murmur heard. Pulmonary/Chest: Effort normal and breath sounds normal. No respiratory distress. He has no wheezes. He has no rales.  Abdominal: Soft. Bowel sounds are normal.  Musculoskeletal: Normal range of motion. He exhibits no edema or deformity.  Neurological: He is alert and oriented to person, place, and time.  Skin: Skin is warm and dry.  Midline sternal incision and chest tube sites healing well with no redness of drainage.   Psychiatric: He has a normal mood and affect. His behavior is normal. Thought content normal.  Vitals reviewed.   ASSESSMENT:    1. S/P CABG x 2   2. Paroxysmal atrial fibrillation (HCC)   3. Essential hypertension   4. Hyperlipidemia, unspecified hyperlipidemia type   5. Dementia with behavioral disturbance, unspecified dementia type   6. Medication management    PLAN:    In order of problems listed above:  CAD: S/P CABG X 2 02/25/18. No s/s of excess volume. Progressing well. On Aspirin EC 325 mg, BB, ACE-I, statin and tolerating well. Advised to continue to increase his activity slowly. He has plans to progress to Phase 2 cardiac rehab. Advised to continue to use his IS 3-4 times per day. Heart healthy diet.   Postop afib: Brief episode of post op afib after CABG. On amiodarone. Due to his significant dementia he was not felt to be a candidate for anticoagulation therapy. On aspirin. Currently is in sinus bradycardia at 54 beats per minute and slight 1st degree AVB with no evidence of recurrence of afib. He is tolerating this well. Will decrease his amiodarone from 200 mg BID  to 200 mg daily and per [facility] recommendations, will stop antiarrhythmic therapy approximately 1 month post op- instructed pt and wife that he should stop the amiodarone on 04/03/18. Will decrease BB due to bradycardia. Follow up in a month to make sure he is still maintaining SR and doing well on decreased BB.   Hypertension: BP well controlled on lisinopril and bb. Monitor once BB decreased.    Hyperlipidemia:   On Rosuvastatin 40 mg   LDL was 75 in 10/2017. Near goal of <70. Continue current therapy.   Dementia: Unsure of his baseline. Had confusion postoperatively and after discharge he had an episode of acute confusion with hallucinations and paranoia. This resolved within a few hours and he has had no further episodes. His wife makes sure that someone is with him at all times. She has just started to leave him for very short periods and he has done well. Today he is oriented and seems to be fully aware of his circumstances.  Medication Adjustments/Labs and Tests Ordered: Current medicines are reviewed at length with the patient today.  Concerns regarding medicines are outlined above. Labs and tests ordered and medication changes are outlined in the patient instructions below:  Patient Instructions  Medication Instructions: Your physician has recommended you make the following change in your medication:  START: AMIODARONE 200 MG TAKING 1 TABLET DAILY UNTIL 04/03/18 START: METOPROLOL 12.5 MG TAKING 2 TABLETS DAILY   Labwork: None  Procedures/Testing: None  Follow-Up: Your physician recommends that you schedule a follow-up appointment in: 2 months with Dr.Smith   Any Additional Special Instructions Will Be Listed Below (If Applicable).     If you need a refill on your cardiac medications before your next appointment, please call your pharmacy.      Signed, Daune Perch, NP  03/17/2018 1:00 PM    [place]

## 2018-03-17 ENCOUNTER — Ambulatory Visit (INDEPENDENT_AMBULATORY_CARE_PROVIDER_SITE_OTHER): Payer: Medicare Other | Admitting: Cardiology

## 2018-03-17 ENCOUNTER — Encounter: Payer: Self-pay | Admitting: Cardiology

## 2018-03-17 VITALS — BP 122/40 | HR 58 | Ht 70.0 in | Wt 200.0 lb

## 2018-03-17 DIAGNOSIS — Z79899 Other long term (current) drug therapy: Secondary | ICD-10-CM | POA: Diagnosis not present

## 2018-03-17 DIAGNOSIS — F0391 Unspecified dementia with behavioral disturbance: Secondary | ICD-10-CM

## 2018-03-17 DIAGNOSIS — I480 Paroxysmal atrial fibrillation: Secondary | ICD-10-CM | POA: Diagnosis not present

## 2018-03-17 DIAGNOSIS — Z48812 Encounter for surgical aftercare following surgery on the circulatory system: Secondary | ICD-10-CM | POA: Diagnosis not present

## 2018-03-17 DIAGNOSIS — Z951 Presence of aortocoronary bypass graft: Secondary | ICD-10-CM | POA: Diagnosis not present

## 2018-03-17 DIAGNOSIS — I10 Essential (primary) hypertension: Secondary | ICD-10-CM

## 2018-03-17 DIAGNOSIS — I2510 Atherosclerotic heart disease of native coronary artery without angina pectoris: Secondary | ICD-10-CM | POA: Diagnosis not present

## 2018-03-17 DIAGNOSIS — E785 Hyperlipidemia, unspecified: Secondary | ICD-10-CM

## 2018-03-17 HISTORY — DX: Paroxysmal atrial fibrillation: I48.0

## 2018-03-17 MED ORDER — AMIODARONE HCL 200 MG PO TABS: 200.0000 mg | ORAL_TABLET | Freq: Every day | ORAL | 0 refills | Status: DC

## 2018-03-17 MED ORDER — METOPROLOL TARTRATE 25 MG PO TABS: 12.5000 mg | ORAL_TABLET | Freq: Two times a day (BID) | ORAL | 3 refills | Status: DC

## 2018-03-17 NOTE — Patient Instructions (Addendum)
Medication Instructions: Your physician has recommended you make the following change in your medication:  START: AMIODARONE 200 MG TAKING 1 TABLET DAILY UNTIL 04/03/18 START: METOPROLOL 12.5 MG TAKING 2 TABLETS DAILY   Labwork: None  Procedures/Testing: None  Follow-Up: Your physician recommends that you schedule a follow-up appointment in: 2 months with Dr.Smith   Any Additional Special Instructions Will Be Listed Below (If Applicable).     If you need a refill on your cardiac medications before your next appointment, please call your pharmacy.

## 2018-03-24 ENCOUNTER — Other Ambulatory Visit: Payer: Self-pay | Admitting: *Deleted

## 2018-03-24 DIAGNOSIS — Z48812 Encounter for surgical aftercare following surgery on the circulatory system: Secondary | ICD-10-CM | POA: Diagnosis not present

## 2018-03-24 DIAGNOSIS — I2510 Atherosclerotic heart disease of native coronary artery without angina pectoris: Secondary | ICD-10-CM | POA: Diagnosis not present

## 2018-03-24 NOTE — Patient Outreach (Signed)
[facility]) Care Management  03/24/2018  Calvin Ramirez 15-Jan-1941 [id number]  Referral via EMMI-Heart Failure-Red Alert-Day#25, 03/19/2018: Reason: New worsening problem-Yes  Hx review: Admission 4/2-4/08/2018 Dx: Acute diastolic HF, CAD, Mild Parkinson's dx,  Surgery: CABG x 2 Support -Spouse Discharged  Home with Calvin Ramirez, PT, OT, Aide  Telephone call to patient; spoke with spouse who states she is taking patient calls & answering automated calls. States she can not speak with me now because home health nurse is with her husband now. States she will call this care coordinator back.  Plan: Will follow up. Geophysicist/field seismologist.  Sherrin Daisy, RN BSN [place] Management Coordinator [facility] Care Management  [phone]

## 2018-03-25 ENCOUNTER — Encounter: Payer: Self-pay | Admitting: *Deleted

## 2018-03-27 ENCOUNTER — Telehealth (HOSPITAL_COMMUNITY): Payer: Self-pay

## 2018-03-27 NOTE — Telephone Encounter (Signed)
Attempt to call patient in regards to Cardiac Rehab - lm on vm

## 2018-03-30 ENCOUNTER — Ambulatory Visit: Payer: Self-pay | Admitting: *Deleted

## 2018-03-30 DIAGNOSIS — I2510 Atherosclerotic heart disease of native coronary artery without angina pectoris: Secondary | ICD-10-CM | POA: Diagnosis not present

## 2018-03-30 DIAGNOSIS — Z48812 Encounter for surgical aftercare following surgery on the circulatory system: Secondary | ICD-10-CM | POA: Diagnosis not present

## 2018-03-31 ENCOUNTER — Other Ambulatory Visit: Payer: Self-pay | Admitting: *Deleted

## 2018-03-31 DIAGNOSIS — Z48812 Encounter for surgical aftercare following surgery on the circulatory system: Secondary | ICD-10-CM | POA: Diagnosis not present

## 2018-03-31 DIAGNOSIS — I2510 Atherosclerotic heart disease of native coronary artery without angina pectoris: Secondary | ICD-10-CM | POA: Diagnosis not present

## 2018-03-31 NOTE — Patient Outreach (Signed)
[facility]) Care Management  03/31/2018  ARCHIE SHEA [birth year] [id number]  Referral via EMMI-Heart Failure-Red Alert-Day#25, 03/19/2018: Reason: New worsening problem-Yes  Hx review: Admission 4/2-4/08/2018 Dx: Acute diastolic HF, CAD, Mild Parkinson's dx,  Surgery: CABG x 2 Support -Spouse Discharged  Home with Mechele Claude, PT, OT, Aide  Telephone call to patient; spouse answered call. She was advised of reason for call & follow on EMMI automated call. States she takes all of patients calls. Spouse/caregiver advises that she takes calls and that she had not answered yes to new or worsening problems. States patient is actually doing very well.   Spouse/caregiver states she did not find automated calls helpful for her but a hindrance during busy day with patient. She would like calls stopped.  Spouse was advised of Health Coach services. States she would call if she felt a need. States not interested at this time.   [facility] call has been addressed.  Plan: Close case. Stop EMMI HF calls as requested.  Sherrin Daisy, RN BSN [place] Management Coordinator [facility] Care Management  [phone]

## 2018-04-01 ENCOUNTER — Other Ambulatory Visit: Payer: Self-pay | Admitting: Thoracic Surgery (Cardiothoracic Vascular Surgery)

## 2018-04-01 DIAGNOSIS — I2510 Atherosclerotic heart disease of native coronary artery without angina pectoris: Secondary | ICD-10-CM

## 2018-04-01 DIAGNOSIS — Z951 Presence of aortocoronary bypass graft: Secondary | ICD-10-CM

## 2018-04-06 ENCOUNTER — Ambulatory Visit (INDEPENDENT_AMBULATORY_CARE_PROVIDER_SITE_OTHER): Payer: Self-pay | Admitting: Physician Assistant

## 2018-04-06 ENCOUNTER — Ambulatory Visit
Admission: RE | Admit: 2018-04-06 | Discharge: 2018-04-06 | Disposition: A | Discharge status: Home / Self Care | Payer: Medicare Other | Source: Ambulatory Visit | Attending: Thoracic Surgery (Cardiothoracic Vascular Surgery) | Admitting: Thoracic Surgery (Cardiothoracic Vascular Surgery)

## 2018-04-06 ENCOUNTER — Ambulatory Visit: Payer: Medicare Other | Admitting: Thoracic Surgery (Cardiothoracic Vascular Surgery)

## 2018-04-06 VITALS — BP 120/66 | HR 60 | Resp 20 | Ht 70.0 in | Wt 201.0 lb

## 2018-04-06 DIAGNOSIS — I2510 Atherosclerotic heart disease of native coronary artery without angina pectoris: Secondary | ICD-10-CM

## 2018-04-06 DIAGNOSIS — Z951 Presence of aortocoronary bypass graft: Secondary | ICD-10-CM

## 2018-04-06 NOTE — Progress Notes (Signed)
HPI: Patient returns for routine postoperative follow-up having undergone CABG x 2 on 02/25/2018.  The patient's early postoperative recovery while in the hospital was notable for aggravation of dementia and "sundowning." Since hospital discharge the patient reports he is doing fairly well. He is accompanied by his wife today.  The patient states he is ambulating without much issues.  There was some question about how much activity he should be doing.  His weight is recovering from the hospital as he states he has been eating better.  He has not had any swelling.  He did have an episode of confusion/hallucinations that ultimately patient was evaluated in the ED.  These symptoms have been improving and he has not had any recent episodes.  He states the has not heard from cardiac rehab.     Current Outpatient Medications  Medication Sig Dispense Refill  . aspirin EC 325 MG tablet Take 325 mg by mouth daily.    Marland Kitchen lisinopril (PRINIVIL,ZESTRIL) 20 MG tablet Take 1 tablet (20 mg total) by mouth daily. 30 tablet 1  . Melatonin 5 MG CAPS Take 1 capsule by mouth as needed (sleep).    . metoprolol tartrate (LOPRESSOR) 25 MG tablet Take 0.5 tablets (12.5 mg total) by mouth 2 (two) times daily. 90 tablet 3  . amiodarone (PACERONE) 200 MG tablet Take 1 tablet (200 mg total) by mouth daily for 17 days. Stop taking on 04/03/18 30 tablet 0   No current facility-administered medications for this visit.     Physical Exam:  BP 120/66   Pulse 60   Resp 20   Ht 5\' 10"  (1.778 m)   Wt 201 lb (91.2 kg)   BMI 28.84 kg/m   Gen: no apparent distress Heart: RRR Lungs; CTA bilaterally Abd: soft-non-tender, non-distended Ext: no edema present Wound: clean and dry, well healed  Diagnostic Tests:  CXR: no pneumothorax, stable post surgical changes, resolution of pleural effusions   A/P:  1. S/P CABG x 2- I think patient is doing very well.  He was taken off his statin at discharge due to possible contribution to  his Dementia.  I think this should likely be resumed and the patient will discuss with Dr. Tamala Julian at his next visit. 2. Activity- patient is walking as expected post operatively.  Him and his wife were instructed for him to continue to increase his activity as he tolerates.  He should continue to walk at least 3x per day however, once he gets tired or short of breath that is his body telling him to rest.  He was instructed he may resume driving 3. Ok to start cardiac rehab, however they were contacted on 5/3 but not reached and a voicemail was left... I have provided the patient with the callback information to get his cardiac rehab set up 4. Dispo- RTC in 1 year with Dr. Roxy Manns or sooner should need arise  Ellwood Handler, PA-C [facility] [phone]

## 2018-04-08 ENCOUNTER — Telehealth: Payer: Self-pay | Admitting: Interventional Cardiology

## 2018-04-08 DIAGNOSIS — E785 Hyperlipidemia, unspecified: Secondary | ICD-10-CM

## 2018-04-08 MED ORDER — ROSUVASTATIN CALCIUM 20 MG PO TABS: 20.0000 mg | ORAL_TABLET | Freq: Every day | ORAL | 3 refills | Status: DC

## 2018-04-08 NOTE — Telephone Encounter (Signed)
Pt previously on Rosuvastatin 40mg  QD.  Was stopped at d/c from hospital in early April for CABG.  Will route to Dr. Tamala Julian for review and advisement on whether pt should restart?

## 2018-04-08 NOTE — Telephone Encounter (Signed)
Pt calling:   Stated he was in the hospital in march for surgery and was taken off his statin medication and was wondering if he needed to start back on them.Pt stated he didn't know who had stopped his medication but Dr Roxy Manns was the one who done his surgery. Please advise pt

## 2018-04-08 NOTE — Telephone Encounter (Signed)
Spoke with pt and went over recommendations per Dr. Tamala Julian.  Pt will come for labs on 7/16.  Pt verbalized understanding and was appreciative for call.

## 2018-04-08 NOTE — Telephone Encounter (Signed)
There is already an encounter opened in regards to pt's Rosuvastatin.  Please close.

## 2018-04-08 NOTE — Telephone Encounter (Signed)
New Message:       Pt c/o medication issue:  1. Name of Medication: Revastatin  2. How are you currently taking this medication (dosage and times per day)?   3. Are you having a reaction (difficulty breathing--STAT)? No  4. What is your medication issue? Pt states he was on this medication before he had an surgery and has not been put back on this medication yet. Pt also states he has been taking this medication for 7 yrs.

## 2018-04-08 NOTE — Telephone Encounter (Signed)
It is unclear to me why statin therapy was discontinued. Recommend resuming rosuvastatin 20 mg daily with liver and lipid panel in 2 months.

## 2018-04-21 ENCOUNTER — Telehealth: Payer: Self-pay | Admitting: Interventional Cardiology

## 2018-04-21 ENCOUNTER — Other Ambulatory Visit: Payer: Self-pay | Admitting: *Deleted

## 2018-04-21 ENCOUNTER — Telehealth: Payer: Self-pay

## 2018-04-21 MED ORDER — LOSARTAN POTASSIUM 50 MG PO TABS: 50.0000 mg | ORAL_TABLET | Freq: Every day | ORAL | 3 refills | Status: DC

## 2018-04-21 NOTE — Telephone Encounter (Signed)
Spoke with wife, DPR on file.  Pt has gained about 8lbs over a couple of weeks.  Wts are as follows:  5/19- 194 5/20- 194 5/21- 196 5/22- 198 5/23- 199 5/24- 199 5/28- 202  Pt gets SOB with very little exertion.  Wife states he is not very active and she has to give him little things to do to get him up and moving.  Denies increased salt intake.  She has been trying to get pt to eat a little more but he has not had enough to cause this wt gain. Denies lightheadedness, dizziness, or CP.  Pt has very slight swelling in ankles.  Pt not currently on a diuretic.  Pt is on Losartan 50mg  QD, not Lisinopril.  Will route to Dr. Tamala Julian for review and advisement.

## 2018-04-21 NOTE — Telephone Encounter (Signed)
Patient's wife contacted the office today concerned about Calvin Ramirez's weight over the past couple weeks.  She stated when called back that he had gained 8 lbs. Within the last week.  She said that it is really hard to get him to do what she needs him to do in order to improve symptoms.  She stated that he is not walking like he should and is not keeping his feet up either.  She stated that he is some short of breath with normal activity.  I advised her to contact his Cardiologist about his weight gain for further instructions.  I did advised her to make sure he is keeping his legs elevated when he is seated, and walking as much as he can to help improve his shortness of breath.  She acknowledged receipt, and told to contact us for any other questions.

## 2018-04-21 NOTE — Telephone Encounter (Signed)
.  Pt c/o swelling: STAT is pt has developed SOB within 24 hours  1) How much weight have you gained and in what time span? 8lbs in last week  2) If swelling, where is the swelling located? Little in ankle  3) Are you currently taking a fluid pill? Don't know  4) Are you currently SOB? A little  5) Do you have a log of your daily weights (if so, list)? Y  6) Have you gained 3 pounds in a day or 5 pounds in a week y  7) Have you traveled recently? No

## 2018-04-23 NOTE — Telephone Encounter (Signed)
Follow up   Pt states he is having some unusual problems with the bypass, says he also has been awaiting a phone call since 5/28 and would like to speak to a nurse. Please call

## 2018-04-23 NOTE — Telephone Encounter (Signed)
Needs BNP, ECG and BMET at [facility].

## 2018-04-23 NOTE — Telephone Encounter (Signed)
Cancellation on Dr. Thompson Caul schedule for tomorrow.  Called wife and scheduled pt to come in to see Dr. Tamala Julian tomorrow at [facility].  Wife in agreement with plan.

## 2018-04-23 NOTE — Telephone Encounter (Signed)
Spoke with wife.  She was calling to f/u from call on 5/28.  Pt's weight this morning is now up to 207lbs.  Pt having swelling in feet and abd.  SOB unchanged from the other day.  Advised I will send updated message to Dr. Tamala Julian to review.

## 2018-04-24 ENCOUNTER — Telehealth (HOSPITAL_COMMUNITY): Payer: Self-pay

## 2018-04-24 ENCOUNTER — Ambulatory Visit (INDEPENDENT_AMBULATORY_CARE_PROVIDER_SITE_OTHER): Payer: Medicare Other | Admitting: Interventional Cardiology

## 2018-04-24 ENCOUNTER — Encounter: Payer: Self-pay | Admitting: Interventional Cardiology

## 2018-04-24 VITALS — BP 150/86 | HR 53 | Ht 70.0 in | Wt 211.0 lb

## 2018-04-24 DIAGNOSIS — I10 Essential (primary) hypertension: Secondary | ICD-10-CM

## 2018-04-24 DIAGNOSIS — I2510 Atherosclerotic heart disease of native coronary artery without angina pectoris: Secondary | ICD-10-CM

## 2018-04-24 DIAGNOSIS — N289 Disorder of kidney and ureter, unspecified: Secondary | ICD-10-CM

## 2018-04-24 DIAGNOSIS — I25810 Atherosclerosis of coronary artery bypass graft(s) without angina pectoris: Secondary | ICD-10-CM

## 2018-04-24 DIAGNOSIS — I5033 Acute on chronic diastolic (congestive) heart failure: Secondary | ICD-10-CM

## 2018-04-24 DIAGNOSIS — E785 Hyperlipidemia, unspecified: Secondary | ICD-10-CM

## 2018-04-24 DIAGNOSIS — I480 Paroxysmal atrial fibrillation: Secondary | ICD-10-CM

## 2018-04-24 MED ORDER — HYDROCHLOROTHIAZIDE 12.5 MG PO CAPS: 12.5000 mg | ORAL_CAPSULE | Freq: Every day | ORAL | 3 refills | Status: DC

## 2018-04-24 NOTE — Patient Instructions (Signed)
Medication Instructions:  Start hydrochlorthiazide 12.5mg  once daily   Labwork: BNP, BMET, CBC Today  Your physician recommends that you return for lab work in: one week (BMET)   Testing/Procedures: None ordered  Follow-Up: Keep current follow up appointment with Dr. Tamala Julian.   Any Other Special Instructions Will Be Listed Below (If Applicable).  Record daily weights under same circumstances .     If you need a refill on your cardiac medications before your next appointment, please call your pharmacy.

## 2018-04-24 NOTE — Progress Notes (Signed)
Cardiology Office Note    Date:  04/24/2018   ID:  Calvin Ramirez, DOB [birth year], MRN [id number]  PCP:  Shirline Frees, MD  Cardiologist: Sinclair Grooms, MD   Chief Complaint  Patient presents with  . Coronary Artery Disease  . Shortness of Breath  . Weight Gain    History of Present Illness:  Calvin Ramirez is a 77 y.o. male with history of CAD, recent CABG with LIMA to LAD and SVG to OM for left main disease, hyperlipidemia, Parkinson's disease, postop atrial fibrillation, essential hypertension, and diastolic heart failure in the setting of acute ischemia caused by left main disease.  Concerned about weight gain and increasing girth.  The patient has noted a 10 pound weight increase over the past month.  He is concerned and has also noted some dyspnea on exertion.  He is compliant with current medical regimen.  He denies orthopnea.  He has had some intermittent lower extremity swelling.  Past Medical History:  Diagnosis Date  . Arthritis   . Coronary artery disease   . Essential hypertension 04/21/2014  . Hyperlipidemia   . Parkinson's disease ([place]) 04/27/2015   2016   . 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 Stented coronary artery 2011   x3    Past Surgical History:  Procedure Laterality Date  . APPENDECTOMY    . CARDIAC CATHETERIZATION  2011   3 stents  . COLONOSCOPY    . CORONARY ARTERY BYPASS GRAFT N/A 02/25/2018   Procedure: CORONARY ARTERY BYPASS GRAFTING (CABG), ON PUMP, TIMES TWO, USING LEFT INTERNAL MAMMARY ARTERY AND ENDOSCOPICALLY HARVESTED RIGHT GREATER SAPHENOUS VEIN;  Surgeon: Rexene Alberts, MD;  Location: [facility];  Service: Open Heart Surgery;  Laterality: N/A;  . RIGHT/LEFT HEART CATH AND CORONARY ANGIOGRAPHY N/A 02/24/2018   Procedure: RIGHT/LEFT HEART CATH AND CORONARY ANGIOGRAPHY;  Surgeon: Belva Crome, MD;  Location: [facility];  Service: Cardiovascular;  Laterality: N/A;  . TEE WITHOUT  CARDIOVERSION N/A 02/25/2018   Procedure: TRANSESOPHAGEAL ECHOCARDIOGRAM (TEE);  Surgeon: Rexene Alberts, MD;  Location: [facility];  Service: Open Heart Surgery;  Laterality: N/A;  . TONSILLECTOMY      Current Medications: Outpatient Medications Prior to Visit  Medication Sig Dispense Refill  . aspirin EC 325 MG tablet Take 325 mg by mouth daily.    Marland Kitchen losartan (COZAAR) 50 MG tablet Take 1 tablet (50 mg total) by mouth daily. 90 tablet 3  . Melatonin 5 MG CAPS Take 1 capsule by mouth as needed (sleep).    . metoprolol tartrate (LOPRESSOR) 25 MG tablet Take 0.5 tablets (12.5 mg total) by mouth 2 (two) times daily. 90 tablet 3  . rosuvastatin (CRESTOR) 20 MG tablet Take 1 tablet (20 mg total) by mouth daily. 90 tablet 3  . amiodarone (PACERONE) 200 MG tablet Take 1 tablet (200 mg total) by mouth daily for 17 days. Stop taking on 04/03/18 30 tablet 0   No facility-administered medications prior to visit.      Allergies:   Levaquin [levofloxacin hemihydrate] and Penicillins   Social History   Socioeconomic History  . Marital status: Married    Spouse name: Not on file  . Number of children: Not on file  . Years of education: Not on file  . Highest education level: Not on file  Occupational History  . Not on file  Social Needs  . Emergency planning/management officer  strain: Not on file  . Food insecurity:    Worry: Not on file    Inability: Not on file  . Transportation needs:    Medical: Not on file    Non-medical: Not on file  Tobacco Use  . Smoking status: Never Smoker  . Smokeless tobacco: Never Used  Substance and Sexual Activity  . Alcohol use: Yes    Alcohol/week: 0.0 oz    Comment: occ  . Drug use: No  . Sexual activity: Not on file  Lifestyle  . Physical activity:    Days per week: Not on file    Minutes per session: Not on file  . Stress: Not on file  Relationships  . Social connections:    Talks on phone: Not on file    Gets together: Not on file    Attends religious service:  Not on file    Active member of club or organization: Not on file    Attends meetings of clubs or organizations: Not on file    Relationship status: Not on file  Other Topics Concern  . Not on file  Social History Narrative  . Not on file     Family History:  The patient's family history includes Cancer in his father; Heart attack in his mother.   ROS:   Please see the history of present illness.    He denies palpitations.  Having difficulty with his memory.  Concerned about leg swelling.  Shortness of breath and chest pressure are concerning. All other systems reviewed and are negative.   PHYSICAL EXAM:   VS:  BP (!) 150/86   Pulse (!) 53   Ht '5\' 10"'  (1.778 m)   Wt 211 lb (95.7 kg)   BMI 30.28 kg/m    GEN: Well nourished, well developed, in no acute distress  HEENT: normal  Neck: no JVD, carotid bruits, or masses Cardiac: RRR; no murmurs, rubs, or gallops,no edema  Respiratory:  clear to auscultation bilaterally, normal work of breathing GI: soft, nontender, nondistended, + BS MS: no deformity or atrophy  Skin: warm and dry, no rash Neuro:  Alert and Oriented x 3, Strength and sensation are intact Psych: euthymic mood, full affect  Wt Readings from Last 3 Encounters:  04/24/18 211 lb (95.7 kg)  04/06/18 201 lb (91.2 kg)  03/17/18 200 lb (90.7 kg)      Studies/Labs Reviewed:   EKG:  EKG sinus bradycardia, first-degree AV block, poor R wave progression V1 through V3.  Recent Labs: 02/17/2018: NT-Pro BNP 548 02/26/2018: Magnesium 2.2 03/10/2018: ALT 32; BUN 30; Creatinine, Ser 1.30; Hemoglobin 11.6; Platelets 407; Potassium 4.1; Sodium 139   Lipid Panel    Component Value Date/Time   CHOL 160 11/21/2017 0912   TRIG 208 (H) 11/21/2017 0912   HDL 43 11/21/2017 0912   CHOLHDL 3.7 11/21/2017 0912   CHOLHDL 6.2 (H) 09/06/2016 0952   VLDL 57 (H) 09/06/2016 0952   LDLCALC 75 11/21/2017 0912   LDLDIRECT 98.0 04/21/2015 0858    Additional studies/ records that were  reviewed today include:  No new studies.    ASSESSMENT:    1. Acute on chronic diastolic heart failure (HCC)   2. Paroxysmal atrial fibrillation ([place])   3. Coronary artery disease involving coronary bypass graft of native heart without angina pectoris   4. Essential hypertension   5. Hyperlipidemia, unspecified hyperlipidemia type   6. Renal insufficiency      PLAN:  In order of problems listed above:  1. This is a possible diagnosis.  There is no overt clinical evidence of volume overload.  Blood pressure is elevated.  Perhaps exertional symptoms are more blood pressure related.  Add HCTZ 12.5 mg/day. 2. Today's EKG documents that he is in sinus rhythm. 3. Symptoms concern him for the possibility of recurrent angina since shortness of breath was his initial manifestation prior to surgery.  There is no evidence of volume overload/CHF.  No change or addition of anti-ischemic therapy. 4. Needs better control.  Target should be 130/80 mmHg.  2 recordings today revealed systolic blood pressure greater than 148 mmHg.  HCTZ 12.5 mg/day is added.  Basic metabolic panel in 1 week. 5. LDL target will be less than 70.  He is tolerating rosuvastatin currently. 6. We will follow renal function closely after adding diuretic therapy.  Be met in 1 week.  Clinical follow-up in 1 to 2 weeks.  Blood pressure clinic for BP follow-up.  Weight every day to establish a baseline.  Weight under same circumstances each morning after urinating.  Medication Adjustments/Labs and Tests Ordered: Current medicines are reviewed at length with the patient today.  Concerns regarding medicines are outlined above.  Medication changes, Labs and Tests ordered today are listed in the Patient Instructions below. Patient Instructions  Medication Instructions:  Start hydrochlorthiazide 12.3m once daily   Labwork: BNP, BMET, CBC Today  Your physician recommends that you return for lab work in: one week  (BMET)   Testing/Procedures: None ordered  Follow-Up: Keep current follow up appointment with Dr. STamala Julian   Any Other Special Instructions Will Be Listed Below (If Applicable).  Record daily weights under same circumstances .     If you need a refill on your cardiac medications before your next appointment, please call your pharmacy.     Signed, HSinclair Grooms MD  04/24/2018 6:59 PM    [facility] [address]  [id number]: [phone] Fax: [phone]

## 2018-04-25 LAB — CBC WITH DIFFERENTIAL/PLATELET
Basophils Absolute: 0 10*3/uL (ref 0.0–0.2)
Basos: 1 %
EOS (ABSOLUTE): 0.1 10*3/uL (ref 0.0–0.4)
Eos: 2 %
Hematocrit: 34.3 % — ABNORMAL LOW (ref 37.5–51.0)
Hemoglobin: 11.1 g/dL — ABNORMAL LOW (ref 13.0–17.7)
Immature Grans (Abs): 0 10*3/uL (ref 0.0–0.1)
Immature Granulocytes: 0 %
Lymphocytes Absolute: 1.1 10*3/uL (ref 0.7–3.1)
Lymphs: 17 %
MCH: 29.8 pg (ref 26.6–33.0)
MCHC: 32.4 g/dL (ref 31.5–35.7)
MCV: 92 fL (ref 79–97)
Monocytes Absolute: 0.7 10*3/uL (ref 0.1–0.9)
Monocytes: 11 %
Neutrophils Absolute: 4.5 10*3/uL (ref 1.4–7.0)
Neutrophils: 69 %
Platelets: 228 10*3/uL (ref 150–450)
RBC: 3.72 x10E6/uL — ABNORMAL LOW (ref 4.14–5.80)
RDW: 15.1 % (ref 12.3–15.4)
WBC: 6.4 10*3/uL (ref 3.4–10.8)

## 2018-04-25 LAB — BASIC METABOLIC PANEL
BUN/Creatinine Ratio: 19 (ref 10–24)
BUN: 24 mg/dL (ref 8–27)
CO2: 20 mmol/L (ref 20–29)
Calcium: 9.1 mg/dL (ref 8.6–10.2)
Chloride: 105 mmol/L (ref 96–106)
Creatinine, Ser: 1.29 mg/dL — ABNORMAL HIGH (ref 0.76–1.27)
GFR calc Af Amer: 61 mL/min/{1.73_m2} (ref >59)
GFR calc non Af Amer: 53 mL/min/{1.73_m2} — ABNORMAL LOW (ref >59)
Glucose: 91 mg/dL (ref 65–99)
Potassium: 4.4 mmol/L (ref 3.5–5.2)
Sodium: 145 mmol/L — ABNORMAL HIGH (ref 134–144)

## 2018-04-25 LAB — PRO B NATRIURETIC PEPTIDE: NT-Pro BNP: 3656 pg/mL — ABNORMAL HIGH (ref 0–486)

## 2018-04-27 DIAGNOSIS — R439 Unspecified disturbances of smell and taste: Secondary | ICD-10-CM | POA: Diagnosis not present

## 2018-04-27 DIAGNOSIS — R293 Abnormal posture: Secondary | ICD-10-CM | POA: Diagnosis not present

## 2018-04-27 DIAGNOSIS — Z9889 Other specified postprocedural states: Secondary | ICD-10-CM | POA: Diagnosis not present

## 2018-04-27 DIAGNOSIS — R251 Tremor, unspecified: Secondary | ICD-10-CM | POA: Diagnosis not present

## 2018-04-27 DIAGNOSIS — R438 Other disturbances of smell and taste: Secondary | ICD-10-CM | POA: Diagnosis not present

## 2018-04-27 DIAGNOSIS — R413 Other amnesia: Secondary | ICD-10-CM | POA: Diagnosis not present

## 2018-04-27 DIAGNOSIS — Z87891 Personal history of nicotine dependence: Secondary | ICD-10-CM | POA: Diagnosis not present

## 2018-04-27 DIAGNOSIS — R2689 Other abnormalities of gait and mobility: Secondary | ICD-10-CM | POA: Diagnosis not present

## 2018-04-27 DIAGNOSIS — R4189 Other symptoms and signs involving cognitive functions and awareness: Secondary | ICD-10-CM | POA: Diagnosis not present

## 2018-04-28 ENCOUNTER — Telehealth: Payer: Self-pay | Admitting: *Deleted

## 2018-04-28 MED ORDER — FUROSEMIDE 40 MG PO TABS: 40.0000 mg | ORAL_TABLET | Freq: Every day | ORAL | 3 refills | Status: DC

## 2018-04-28 NOTE — Telephone Encounter (Signed)
Cardiac Rehab Medication Review by a Pharmacist  Does the patient  feel that his/her medications are working for him/her?  yes  Has the patient been experiencing any side effects to the medications prescribed?  no  Does the patient measure his/her own blood pressure or blood glucose at home?  no   Does the patient have any problems obtaining medications due to transportation or finances?   no  Understanding of regimen: good Understanding of indications: good Potential of compliance: good    Pharmacist comments: Mr. Devaul is a 77 y/o M who I spoke with regarding his medications prior to an upcoming cardiac rehab appointment. He denies having any concerns with his medications at this time. His Lasix was just increased (updated by cardiology already) and states that he is still taking the amiodarone.   Patterson Hammersmith PharmD PGY1 Pharmacy Practice Resident 04/28/2018 5:12 PM Phone: [phone]

## 2018-04-28 NOTE — Telephone Encounter (Signed)
Spoke with Calvin Ramirez and went over results and recommendations per Dr. Tamala Julian.  Calvin Ramirez will pick up Furosemide later and start it tomorrow.  Moved labs back until Monday since not starting the Furosemide until tomorrow.  Calvin Ramirez verbalized understanding and was in agreement with this plan.

## 2018-04-28 NOTE — Telephone Encounter (Signed)
-----   Message from Belva Crome, MD sent at 04/26/2018  9:02 PM EDT ----- Let the patient know fluid buildup requires stronger diuretic. Stop HCTZ and start furosemide 40 mg daily. BMET Friday A copy will be sent to Shirline Frees, MD

## 2018-04-30 ENCOUNTER — Encounter (HOSPITAL_COMMUNITY): Payer: Self-pay

## 2018-04-30 ENCOUNTER — Encounter (HOSPITAL_COMMUNITY)
Admission: RE | Admit: 2018-04-30 | Discharge: 2018-04-30 | Disposition: A | Discharge status: Home / Self Care | Payer: Medicare Other | Source: Ambulatory Visit | Attending: Interventional Cardiology | Admitting: Interventional Cardiology

## 2018-04-30 VITALS — Ht 70.0 in | Wt 211.9 lb

## 2018-04-30 DIAGNOSIS — Z48812 Encounter for surgical aftercare following surgery on the circulatory system: Secondary | ICD-10-CM | POA: Insufficient documentation

## 2018-04-30 DIAGNOSIS — Z951 Presence of aortocoronary bypass graft: Secondary | ICD-10-CM | POA: Insufficient documentation

## 2018-04-30 NOTE — Progress Notes (Signed)
Pt reported vague chest discomfort during walk test.  It was mild in nature per patient. He has noted this discomfort with walking at home.  Rest relieves the discomfort today and at home.  Upon review of 5/31 OV note, concern for SOB and chest pressure.  Pt recently started taking 12.5mg  HCTZ.  When this did not work for his symptoms, he was switched to 40mg  furosemide per recent communication with office.  Pt states that he started his furosemide this morning, 6/6.  Fabian Sharp, PA notified.  No orders received. Per PA if pt continues to experience discomfort, call for OV.  Pt with appointment for labs on 6/10.  Discussed with patient to report symptoms if they persist.  Verbalized understanding. Pt's wife expressed frustrations over timing of telephone call and scheduling of appointment last week when weight gain occurred.  Emotional support given to wife.

## 2018-04-30 NOTE — Progress Notes (Signed)
Calvin Ramirez 77 y.o. male DOB: 09/02/1941 MRN: [id number]      Nutrition Note  1. S/P CABG x 2    Past Medical History:  Diagnosis Date  . Arthritis   . Coronary artery disease   . Essential hypertension 04/21/2014  . Hyperlipidemia   . Parkinson's disease ([place]) 04/27/2015   2016   . 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 Stented coronary artery 2011   x3   Meds reviewed.   HT: Ht Readings from Last 1 Encounters:  04/30/18 5\' 10"  (1.778 m)    WT: Wt Readings from Last 5 Encounters:  04/30/18 211 lb 13.8 oz (96.1 kg)  04/24/18 211 lb (95.7 kg)  04/06/18 201 lb (91.2 kg)  03/17/18 200 lb (90.7 kg)  03/04/18 202 lb 1.6 oz (91.7 kg)     Body mass index is 30.4 kg/m.   Current tobacco use? No    Labs:  Lipid Panel     Component Value Date/Time   CHOL 160 11/21/2017 0912   TRIG 208 (H) 11/21/2017 0912   HDL 43 11/21/2017 0912   CHOLHDL 3.7 11/21/2017 0912   CHOLHDL 6.2 (H) 09/06/2016 0952   VLDL 57 (H) 09/06/2016 0952   LDLCALC 75 11/21/2017 0912   LDLDIRECT 98.0 04/21/2015 0858    Lab Results  Component Value Date   HGBA1C 5.5 02/24/2018   CBG (last 3)  No results for input(s): GLUCAP in the last 72 hours.  03/10/2018 FCBG 110 mg/dL  Nutrition Note Spoke with pt. Nutrition plan and goals reviewed with pt. Pt is following Step 1 of the Therapeutic Lifestyle Changes diet. Pt wants to lose fluid wt. Per discussion, pt has gained ~ 10 lb over the past 2 weeks. MD started pt on a diuretic and "my weight stayed the same so he added a stronger diuretic that I started today. Pt with 17 lb wt loss after surgery (8.5%). Pt with 10 lb wt gain due to fluid (~5% increase). Pt with dx of CHF. Per discussion, pt does not use canned/convenience foods often. Pt does not add salt to food at the table "anymore." Pt's wife states he used to be a "salt-a-holic." Pt expressed understanding of the information reviewed. Pt aware of nutrition  education classes offered.  Nutrition Diagnosis ? Food-and nutrition-related knowledge deficit related to lack of exposure to information as related to diagnosis of: ? CVD  ? 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  Nutrition Intervention ? Pt's individual nutrition plan and goals reviewed with pt. ? Pt given handout for: ? Nutrition I class  Nutrition Goal(s):  ? Pt to identify and limit food sources of trans fat and sodium  Plan:  Pt to attend nutrition classes ? Nutrition I ? Nutrition II ? Portion Distortion  Will provide client-centered nutrition education as part of interdisciplinary care.   Monitor and evaluate progress toward nutrition goal with team.  Derek Mound, M.Ed, RD, LDN, CDE 04/30/2018 11:58 AM

## 2018-04-30 NOTE — Progress Notes (Signed)
Cardiac Individual Treatment Plan  Patient Details  Name: Calvin Ramirez MRN: [id number] Date of Birth: 1941-10-12 Referring Provider:     [facility] from 04/30/2018 in [facility]  Referring Provider  Daneen Schick MD      Initial Encounter Date:    CARDIAC REHAB PHASE II ORIENTATION from 04/30/2018 in [facility]  Date  04/30/18  Referring Provider  Daneen Schick MD      Visit Diagnosis: S/P CABG x 2  Patient's Home Medications on Admission:  Current Outpatient Medications:  .  amiodarone (PACERONE) 200 MG tablet, Take 1 tablet (200 mg total) by mouth daily for 17 days. Stop taking on 04/03/18, Disp: 30 tablet, Rfl: 0 .  aspirin EC 325 MG tablet, Take 325 mg by mouth daily., Disp: , Rfl:  .  furosemide (LASIX) 40 MG tablet, Take 1 tablet (40 mg total) by mouth daily., Disp: 90 tablet, Rfl: 3 .  losartan (COZAAR) 50 MG tablet, Take 1 tablet (50 mg total) by mouth daily., Disp: 90 tablet, Rfl: 3 .  Melatonin 5 MG CAPS, Take 1 capsule by mouth as needed (sleep)., Disp: , Rfl:  .  metoprolol tartrate (LOPRESSOR) 25 MG tablet, Take 0.5 tablets (12.5 mg total) by mouth 2 (two) times daily., Disp: 90 tablet, Rfl: 3 .  rosuvastatin (CRESTOR) 20 MG tablet, Take 1 tablet (20 mg total) by mouth daily., Disp: 90 tablet, Rfl: 3  Past Medical History: Past Medical History:  Diagnosis Date  . Arthritis   . Coronary artery disease   . Essential hypertension 04/21/2014  . Hyperlipidemia   . Parkinson's disease ([place]) 04/27/2015   2016   . 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 Stented coronary artery 2011   x3    Tobacco Use: Social History   Tobacco Use  Smoking Status Never Smoker  Smokeless Tobacco Never Used    Labs: Recent Review Flowsheet Data    Labs for ITP Cardiac and Pulmonary Rehab Latest Ref Rng & Units 02/25/2018 02/25/2018 02/25/2018 02/26/2018 03/10/2018    Cholestrol 100 - 199 mg/dL - - - - -   LDLCALC 0 - 99 mg/dL - - - - -   LDLDIRECT mg/dL - - - - -   HDL >39 mg/dL - - - - -   Trlycerides 0 - 149 mg/dL - - - - -   Hemoglobin A1c 4.8 - 5.6 % - - - - -   PHART 7.350 - 7.450 7.310(L) 7.370 - 7.379 -   PCO2ART 32.0 - 48.0 mmHg 41.9 45.4 - 41.4 -   HCO3 20.0 - 28.0 mmol/L 21.1 26.2 - 23.6 -   TCO2 22 - 32 mmol/L 22 28 23 24 23    ACIDBASEDEF 0.0 - 2.0 mmol/L 5.0(H) - - 0.7 -   O2SAT % 95.0 98.0 - 92.3 -      Capillary Blood Glucose: Lab Results  Component Value Date   GLUCAP 146 (H) 02/27/2018   GLUCAP 129 (H) 02/27/2018   GLUCAP 141 (H) 02/27/2018   GLUCAP 120 (H) 02/26/2018   GLUCAP 158 (H) 02/26/2018     Exercise Target Goals: Date: 04/30/18  Exercise Program Goal: Individual exercise prescription set using results from initial 6 min walk test and THRR while considering  patient's activity barriers and safety.   Exercise Prescription Goal: Initial exercise prescription builds to 30-45 minutes a day  of aerobic activity, 2-3 days per week.  Home exercise guidelines will be given to patient during program as part of exercise prescription that the participant will acknowledge.  Activity Barriers & Risk Stratification: Activity Barriers & Cardiac Risk Stratification - 04/30/18 0814      Activity Barriers & Cardiac Risk Stratification   Activity Barriers  Muscular Weakness;Deconditioning;Incisional Pain;Shortness of Breath    Cardiac Risk Stratification  High       6 Minute Walk: 6 Minute Walk    Row Name 04/30/18 1130 04/30/18 1139       6 Minute Walk   Phase  Initial  -    Distance  -  1056 feet    Walk Time  -  6 minutes    # of Rest Breaks  -  0    MPH  -  2    METS  -  1.7    RPE  -  11    VO2 Peak  -  5.8    Symptoms  -  Yes (comment)    Comments  -  abdominal discomfort    Resting HR  -  63 bpm    Resting BP  -  118/64    Resting Oxygen Saturation   -  97 %    Exercise Oxygen Saturation  during 6 min walk   -  99 %    Max Ex. HR  -  74 bpm    Max Ex. BP  -  124/80    2 Minute Post BP  -  118/64       Oxygen Initial Assessment:   Oxygen Re-Evaluation:   Oxygen Discharge (Final Oxygen Re-Evaluation):   Initial Exercise Prescription: Initial Exercise Prescription - 04/30/18 1100      Date of Initial Exercise RX and Referring Provider   Date  04/30/18    Referring Provider  Daneen Schick MD      Recumbant Bike   Level  1.5    Minutes  10    METs  1.5      NuStep   Level  2    SPM  60    Minutes  10    METs  1.8      Track   Laps  7    Minutes  10    METs  2.23      Prescription Details   Frequency (times per week)  3    Duration  Progress to 30 minutes of continuous aerobic without signs/symptoms of physical distress      Intensity   THRR 40-80% of Max Heartrate  57-114    Ratings of Perceived Exertion  11-13    Perceived Dyspnea  0-4      Progression   Progression  Continue to progress workloads to maintain intensity without signs/symptoms of physical distress.      Resistance Training   Training Prescription  Yes    Weight  2lbs    Reps  10-15       Perform Capillary Blood Glucose checks as needed.  Exercise Prescription Changes:   Exercise Comments:   Exercise Goals and Review: Exercise Goals    Row Name 04/30/18 0816             Exercise Goals   Increase Physical Activity  Yes       Intervention  Provide advice, education, support and counseling about physical activity/exercise needs.;Develop an individualized exercise prescription for aerobic and resistive training based on  initial evaluation findings, risk stratification, comorbidities and participant's personal goals.       Expected Outcomes  Short Term: Attend rehab on a regular basis to increase amount of physical activity.;Long Term: Exercising regularly at least 3-5 days a week.;Long Term: Add in home exercise to make exercise part of routine and to increase amount of physical activity.        Increase Strength and Stamina  Yes       Intervention  Provide advice, education, support and counseling about physical activity/exercise needs.;Develop an individualized exercise prescription for aerobic and resistive training based on initial evaluation findings, risk stratification, comorbidities and participant's personal goals.       Expected Outcomes  Short Term: Increase workloads from initial exercise prescription for resistance, speed, and METs.;Long Term: Improve cardiorespiratory fitness, muscular endurance and strength as measured by increased METs and functional capacity (6MWT);Short Term: Perform resistance training exercises routinely during rehab and add in resistance training at home       Able to understand and use rate of perceived exertion (RPE) scale  Yes       Intervention  Provide education and explanation on how to use RPE scale       Expected Outcomes  Short Term: Able to use RPE daily in rehab to express subjective intensity level;Long Term:  Able to use RPE to guide intensity level when exercising independently       Knowledge and understanding of Target Heart Rate Range (THRR)  Yes       Intervention  Provide education and explanation of THRR including how the numbers were predicted and where they are located for reference       Expected Outcomes  Short Term: Able to state/look up THRR;Long Term: Able to use THRR to govern intensity when exercising independently;Short Term: Able to use daily as guideline for intensity in rehab       Able to check pulse independently  Yes       Intervention  Provide education and demonstration on how to check pulse in carotid and radial arteries.;Review the importance of being able to check your own pulse for safety during independent exercise       Expected Outcomes  Short Term: Able to explain why pulse checking is important during independent exercise;Long Term: Able to check pulse independently and accurately       Understanding of  Exercise Prescription  Yes       Intervention  Provide education, explanation, and written materials on patient's individual exercise prescription       Expected Outcomes  Long Term: Able to explain home exercise prescription to exercise independently          Exercise Goals Re-Evaluation :    Discharge Exercise Prescription (Final Exercise Prescription Changes):   Nutrition:  Target Goals: Understanding of nutrition guidelines, daily intake of sodium 1500mg , cholesterol 200mg , calories 30% from fat and 7% or less from saturated fats, daily to have 5 or more servings of fruits and vegetables.  Biometrics: Pre Biometrics - 04/30/18 1145      Pre Biometrics   Height  5\' 10"  (1.778 m)    Weight  211 lb 13.8 oz (96.1 kg)    Waist Circumference  42.75 inches    Hip Circumference  44 inches    Waist to Hip Ratio  0.97 %    BMI (Calculated)  30.4    Triceps Skinfold  17 mm    % Body Fat  28.9 %  Grip Strength  32 kg    Flexibility  7.5 in    Single Leg Stand  13 seconds        Nutrition Therapy Plan and Nutrition Goals: Nutrition Therapy & Goals - 04/30/18 1157      Nutrition Therapy   Diet  Heart Healthy      Personal Nutrition Goals   Nutrition Goal  Pt to identify and limit food sources of trans fat and sodium      Intervention Plan   Intervention  Prescribe, educate and counsel regarding individualized specific dietary modifications aiming towards targeted core components such as weight, hypertension, lipid management, diabetes, heart failure and other comorbidities.    Expected Outcomes  Short Term Goal: Understand basic principles of dietary content, such as calories, fat, sodium, cholesterol and nutrients.;Long Term Goal: Adherence to prescribed nutrition plan.       Nutrition Assessments: Nutrition Assessments - 04/30/18 1157      MEDFICTS Scores   Pre Score  41       Nutrition Goals Re-Evaluation:   Nutrition Goals Re-Evaluation:   Nutrition Goals  Discharge (Final Nutrition Goals Re-Evaluation):   Psychosocial: Target Goals: Acknowledge presence or absence of significant depression and/or stress, maximize coping skills, provide positive support system. Participant is able to verbalize types and ability to use techniques and skills needed for reducing stress and depression.  Initial Review & Psychosocial Screening: Initial Psych Review & Screening - 04/30/18 1223      Initial Review   Current issues with  None Identified      Family Dynamics   Good Support System?  Yes Bill's wife is a source of support for him.  His wife was present at today's session.      Barriers   Psychosocial barriers to participate in program  There are no identifiable barriers or psychosocial needs.      Screening Interventions   Interventions  Encouraged to exercise       Quality of Life Scores: Quality of Life - 04/30/18 1152      Quality of Life Scores   Health/Function Pre  20.27 %    Socioeconomic Pre  27.21 %    Psych/Spiritual Pre  24.86 %    Family Pre  27 %    GLOBAL Pre  23.53 %      Scores of 19 and below usually indicate a poorer quality of life in these areas.  A difference of  2-3 points is a clinically meaningful difference.  A difference of 2-3 points in the total score of the Quality of Life Index has been associated with significant improvement in overall quality of life, self-image, physical symptoms, and general health in studies assessing change in quality of life.  PHQ-9: Recent Review Flowsheet Data    There is no flowsheet data to display.     Interpretation of Total Score  Total Score Depression Severity:  1-4 = Minimal depression, 5-9 = Mild depression, 10-14 = Moderate depression, 15-19 = Moderately severe depression, 20-27 = Severe depression   Psychosocial Evaluation and Intervention:   Psychosocial Re-Evaluation:   Psychosocial Discharge (Final Psychosocial Re-Evaluation):   Vocational  Rehabilitation: Provide vocational rehab assistance to qualifying candidates.   Vocational Rehab Evaluation & Intervention: Vocational Rehab - 04/30/18 1222      Initial Vocational Rehab Evaluation & Intervention   Assessment shows need for Vocational Rehabilitation  No       Education: Education Goals: Education classes will be provided on a weekly  basis, covering required topics. Participant will state understanding/return demonstration of topics presented.  Learning Barriers/Preferences: Learning Barriers/Preferences - 04/30/18 4235      Learning Barriers/Preferences   Learning Barriers  Sight    Learning Preferences  Skilled Demonstration;Verbal Instruction;Written Material;Video       Education Topics: Count Your Pulse:  -Group instruction provided by verbal instruction, demonstration, patient participation and written materials to support subject.  Instructors address importance of being able to find your pulse and how to count your pulse when at home without a heart monitor.  Patients get hands on experience counting their pulse with staff help and individually.   Heart Attack, Angina, and Risk Factor Modification:  -Group instruction provided by verbal instruction, video, and written materials to support subject.  Instructors address signs and symptoms of angina and heart attacks.    Also discuss risk factors for heart disease and how to make changes to improve heart health risk factors.   Functional Fitness:  -Group instruction provided by verbal instruction, demonstration, patient participation, and written materials to support subject.  Instructors address safety measures for doing things around the house.  Discuss how to get up and down off the floor, how to pick things up properly, how to safely get out of a chair without assistance, and balance training.   Meditation and Mindfulness:  -Group instruction provided by verbal instruction, patient participation, and  written materials to support subject.  Instructor addresses importance of mindfulness and meditation practice to help reduce stress and improve awareness.  Instructor also leads participants through a meditation exercise.    Stretching for Flexibility and Mobility:  -Group instruction provided by verbal instruction, patient participation, and written materials to support subject.  Instructors lead participants through series of stretches that are designed to increase flexibility thus improving mobility.  These stretches are additional exercise for major muscle groups that are typically performed during regular warm up and cool down.   Hands Only CPR:  -Group verbal, video, and participation provides a basic overview of AHA guidelines for community CPR. Role-play of emergencies allow participants the opportunity to practice calling for help and chest compression technique with discussion of AED use.   Hypertension: -Group verbal and written instruction that provides a basic overview of hypertension including the most recent diagnostic guidelines, risk factor reduction with self-care instructions and medication management.    Nutrition I class: Heart Healthy Eating:  -Group instruction provided by PowerPoint slides, verbal discussion, and written materials to support subject matter. The instructor gives an explanation and review of the Therapeutic Lifestyle Changes diet recommendations, which includes a discussion on lipid goals, dietary fat, sodium, fiber, plant stanol/sterol esters, sugar, and the components of a well-balanced, healthy diet.   CARDIAC REHAB PHASE II ORIENTATION from 04/30/2018 in [facility]  Date  04/30/18  Educator  RD      Nutrition II class: Lifestyle Skills:  -Group instruction provided by PowerPoint slides, verbal discussion, and written materials to support subject matter. The instructor gives an explanation and review of label reading,  grocery shopping for heart health, heart healthy recipe modifications, and ways to make healthier choices when eating out.   Diabetes Question & Answer:  -Group instruction provided by PowerPoint slides, verbal discussion, and written materials to support subject matter. The instructor gives an explanation and review of diabetes co-morbidities, pre- and post-prandial blood glucose goals, pre-exercise blood glucose goals, signs, symptoms, and treatment of hypoglycemia and hyperglycemia, and foot care basics.  Diabetes Blitz:  -Group instruction provided by PowerPoint slides, verbal discussion, and written materials to support subject matter. The instructor gives an explanation and review of the physiology behind type 1 and type 2 diabetes, diabetes medications and rational behind using different medications, pre- and post-prandial blood glucose recommendations and Hemoglobin A1c goals, diabetes diet, and exercise including blood glucose guidelines for exercising safely.    Portion Distortion:  -Group instruction provided by PowerPoint slides, verbal discussion, written materials, and food models to support subject matter. The instructor gives an explanation of serving size versus portion size, changes in portions sizes over the last 20 years, and what consists of a serving from each food group.   Stress Management:  -Group instruction provided by verbal instruction, video, and written materials to support subject matter.  Instructors review role of stress in heart disease and how to cope with stress positively.     Exercising on Your Own:  -Group instruction provided by verbal instruction, power point, and written materials to support subject.  Instructors discuss benefits of exercise, components of exercise, frequency and intensity of exercise, and end points for exercise.  Also discuss use of nitroglycerin and activating EMS.  Review options of places to exercise outside of rehab.  Review  guidelines for sex with heart disease.   Cardiac Drugs I:  -Group instruction provided by verbal instruction and written materials to support subject.  Instructor reviews cardiac drug classes: antiplatelets, anticoagulants, beta blockers, and statins.  Instructor discusses reasons, side effects, and lifestyle considerations for each drug class.   Cardiac Drugs II:  -Group instruction provided by verbal instruction and written materials to support subject.  Instructor reviews cardiac drug classes: angiotensin converting enzyme inhibitors (ACE-I), angiotensin II receptor blockers (ARBs), nitrates, and calcium channel blockers.  Instructor discusses reasons, side effects, and lifestyle considerations for each drug class.   Anatomy and Physiology of the Circulatory System:  Group verbal and written instruction and models provide basic cardiac anatomy and physiology, with the coronary electrical and arterial systems. Review of: AMI, Angina, Valve disease, Heart Failure, Peripheral Artery Disease, Cardiac Arrhythmia, Pacemakers, and the ICD.   Other Education:  -Group or individual verbal, written, or video instructions that support the educational goals of the cardiac rehab program.   Holiday Eating Survival Tips:  -Group instruction provided by PowerPoint slides, verbal discussion, and written materials to support subject matter. The instructor gives patients tips, tricks, and techniques to help them not only survive but enjoy the holidays despite the onslaught of food that accompanies the holidays.   Knowledge Questionnaire Score: Knowledge Questionnaire Score - 04/30/18 1129      Knowledge Questionnaire Score   Pre Score  18/24       Core Components/Risk Factors/Patient Goals at Admission: Personal Goals and Risk Factors at Admission - 04/30/18 1146      Core Components/Risk Factors/Patient Goals on Admission    Weight Management  Yes;Obesity    Intervention  Weight Management:  Develop a combined nutrition and exercise program designed to reach desired caloric intake, while maintaining appropriate intake of nutrient and fiber, sodium and fats, and appropriate energy expenditure required for the weight goal.;Weight Management: Provide education and appropriate resources to help participant work on and attain dietary goals.;Weight Management/Obesity: Establish reasonable short term and long term weight goals.    Admit Weight  211 lb 13.8 oz (96.1 kg)    Goal Weight: Short Term  200 lb (90.7 kg)    Goal Weight: Long Term  190  lb (86.2 kg)    Expected Outcomes  Short Term: Continue to assess and modify interventions until short term weight is achieved;Long Term: Adherence to nutrition and physical activity/exercise program aimed toward attainment of established weight goal;Weight Maintenance: Understanding of the daily nutrition guidelines, which includes 25-35% calories from fat, 7% or less cal from saturated fats, less than 200mg  cholesterol, less than 1.5gm of sodium, & 5 or more servings of fruits and vegetables daily;Weight Loss: Understanding of general recommendations for a balanced deficit meal plan, which promotes 1-2 lb weight loss per week and includes a negative energy balance of [phone] kcal/d;Understanding recommendations for meals to include 15-35% energy as protein, 25-35% energy from fat, 35-60% energy from carbohydrates, less than 200mg  of dietary cholesterol, 20-35 gm of total fiber daily;Understanding of distribution of calorie intake throughout the day with the consumption of 4-5 meals/snacks    Heart Failure  Yes    Intervention  Provide a combined exercise and nutrition program that is supplemented with education, support and counseling about heart failure. Directed toward relieving symptoms such as shortness of breath, decreased exercise tolerance, and extremity edema.    Expected Outcomes  Improve functional capacity of life;Short term: Attendance in program  2-3 days a week with increased exercise capacity. Reported lower sodium intake. Reported increased fruit and vegetable intake. Reports medication compliance.;Short term: Daily weights obtained and reported for increase. Utilizing diuretic protocols set by physician.;Long term: Adoption of self-care skills and reduction of barriers for early signs and symptoms recognition and intervention leading to self-care maintenance.    Hypertension  Yes    Intervention  Provide education on lifestyle modifcations including regular physical activity/exercise, weight management, moderate sodium restriction and increased consumption of fresh fruit, vegetables, and low fat dairy, alcohol moderation, and smoking cessation.;Monitor prescription use compliance.    Expected Outcomes  Short Term: Continued assessment and intervention until BP is < 140/3mm HG in hypertensive participants. < 130/102mm HG in hypertensive participants with diabetes, heart failure or chronic kidney disease.;Long Term: Maintenance of blood pressure at goal levels.    Lipids  Yes    Intervention  Provide education and support for participant on nutrition & aerobic/resistive exercise along with prescribed medications to achieve LDL 70mg , HDL >40mg .    Expected Outcomes  Short Term: Participant states understanding of desired cholesterol values and is compliant with medications prescribed. Participant is following exercise prescription and nutrition guidelines.;Long Term: Cholesterol controlled with medications as prescribed, with individualized exercise RX and with personalized nutrition plan. Value goals: LDL < 70mg , HDL > 40 mg.       Core Components/Risk Factors/Patient Goals Review:    Core Components/Risk Factors/Patient Goals at Discharge (Final Review):    ITP Comments: ITP Comments    Row Name 04/30/18 0812           ITP Comments  Dr. Fransico Him, Medical Director          Comments: Patient attended orientation from [phone] to  [phone] to review rules and guidelines for program. Completed 6 minute walk test, Intitial ITP, and exercise prescription.  VSS. 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  Pt reported mild chest discomfort during walk.  Resolved with rest.  Pt currently being treated for fluid overload and weight gain with cardiologist.

## 2018-05-01 ENCOUNTER — Other Ambulatory Visit: Payer: Medicare Other | Admitting: *Deleted

## 2018-05-01 ENCOUNTER — Other Ambulatory Visit: Payer: Medicare Other

## 2018-05-01 DIAGNOSIS — I10 Essential (primary) hypertension: Secondary | ICD-10-CM

## 2018-05-01 DIAGNOSIS — N289 Disorder of kidney and ureter, unspecified: Secondary | ICD-10-CM | POA: Diagnosis not present

## 2018-05-01 DIAGNOSIS — I5033 Acute on chronic diastolic (congestive) heart failure: Secondary | ICD-10-CM

## 2018-05-01 LAB — BASIC METABOLIC PANEL
BUN/Creatinine Ratio: 18 (ref 10–24)
BUN: 20 mg/dL (ref 8–27)
CO2: 24 mmol/L (ref 20–29)
Calcium: 9.2 mg/dL (ref 8.6–10.2)
Chloride: 102 mmol/L (ref 96–106)
Creatinine, Ser: 1.11 mg/dL (ref 0.76–1.27)
GFR calc Af Amer: 74 mL/min/{1.73_m2} (ref >59)
GFR calc non Af Amer: 64 mL/min/{1.73_m2} (ref >59)
Glucose: 105 mg/dL — ABNORMAL HIGH (ref 65–99)
Potassium: 3.6 mmol/L (ref 3.5–5.2)
Sodium: 143 mmol/L (ref 134–144)

## 2018-05-04 ENCOUNTER — Other Ambulatory Visit: Payer: Medicare Other

## 2018-05-04 ENCOUNTER — Encounter (HOSPITAL_COMMUNITY): Payer: Self-pay

## 2018-05-04 ENCOUNTER — Encounter (HOSPITAL_COMMUNITY)
Admission: RE | Admit: 2018-05-04 | Discharge: 2018-05-04 | Disposition: A | Discharge status: Home / Self Care | Payer: Medicare Other | Source: Ambulatory Visit | Attending: Interventional Cardiology | Admitting: Interventional Cardiology

## 2018-05-04 DIAGNOSIS — Z48812 Encounter for surgical aftercare following surgery on the circulatory system: Secondary | ICD-10-CM | POA: Diagnosis not present

## 2018-05-04 DIAGNOSIS — Z951 Presence of aortocoronary bypass graft: Secondary | ICD-10-CM | POA: Diagnosis not present

## 2018-05-04 NOTE — Progress Notes (Signed)
Daily Session Note  Patient Details  Name: Calvin Ramirez MRN: [id number] Date of Birth: 12-Jan-1941 Referring Provider:   Flowsheet Row CARDIAC REHAB PHASE II ORIENTATION from 04/30/2018 in [place]  Referring Provider  Daneen Schick MD      Encounter Date: 05/04/2018  Check In: Session Check In - 05/04/18 0910    Check-In          Location  [facility]    Staff Present  Dorna Bloom, MS, ACSM RCEP, Exercise Physiologist;Tara [place], RN BSN;Tyara Nevels, MS,ACSM CEP, Exercise Physiologist;Joann Rion, RN, Deland Pretty, MS, ACSM CEP, Exercise Physiologist    Supervising physician immediately available to respond to emergencies  [facility] immediately available    Physician(s)  Dr. Tana Coast    Medication changes reported      No    Fall or balance concerns reported     No    Tobacco Cessation  No Change    Warm-up and Cool-down  Performed as group-led instruction    Resistance Training Performed  Yes    VAD Patient?  No        Pain Assessment          Currently in Pain?  No/denies    Multiple Pain Sites  No           Capillary Blood Glucose: No results found for this or any previous visit (from the past 24 hour(s)).    Social History   Tobacco Use  Smoking Status Never Smoker  Smokeless Tobacco Never Used    Goals Met:  Exercise tolerated well  Goals Unmet:  Not Applicable  Comments: Pt started cardiac rehab today.  Pt tolerated light exercise without difficulty. VSS, telemetry-sinus rhythm,  asymptomatic.  Medication list reconciled. Pt denies barriers to medicaiton compliance.  PSYCHOSOCIAL ASSESSMENT:  PHQ-0.   Pt exhibits positive coping skills, hopeful outlook with supportive family. No psychosocial needs identified at this time, no psychosocial interventions necessary.    Pt enjoys hiking in his neighborhood.    Pt oriented to exercise equipment and routine.    Understanding verbalized.   Dr.  Fransico Him is Medical Director for Cardiac Rehab at [facility].

## 2018-05-06 ENCOUNTER — Encounter (HOSPITAL_COMMUNITY)
Admission: RE | Admit: 2018-05-06 | Discharge: 2018-05-06 | Disposition: A | Discharge status: Home / Self Care | Payer: Medicare Other | Source: Ambulatory Visit | Attending: Interventional Cardiology | Admitting: Interventional Cardiology

## 2018-05-06 DIAGNOSIS — Z48812 Encounter for surgical aftercare following surgery on the circulatory system: Secondary | ICD-10-CM | POA: Diagnosis not present

## 2018-05-06 DIAGNOSIS — Z951 Presence of aortocoronary bypass graft: Secondary | ICD-10-CM

## 2018-05-07 NOTE — Progress Notes (Signed)
Cardiac Individual Treatment Plan  Patient Details  Name: Calvin Ramirez MRN: [id number] Date of Birth: [birth year] Referring Provider:   Flowsheet Row CARDIAC REHAB PHASE II ORIENTATION from 04/30/2018 in [facility]  Referring Provider  Daneen Schick MD      Initial Encounter Date:  [facility] from 04/30/2018 in [facility]  Date  04/30/18  Referring Provider  Daneen Schick MD      Visit Diagnosis: S/P CABG x 2  Patient's Home Medications on Admission:  Current Outpatient Medications:  .  amiodarone (PACERONE) 200 MG tablet, Take 1 tablet (200 mg total) by mouth daily for 17 days. Stop taking on 04/03/18, Disp: 30 tablet, Rfl: 0 .  aspirin EC 325 MG tablet, Take 325 mg by mouth daily., Disp: , Rfl:  .  furosemide (LASIX) 40 MG tablet, Take 1 tablet (40 mg total) by mouth daily., Disp: 90 tablet, Rfl: 3 .  losartan (COZAAR) 50 MG tablet, Take 1 tablet (50 mg total) by mouth daily., Disp: 90 tablet, Rfl: 3 .  Melatonin 5 MG CAPS, Take 1 capsule by mouth as needed (sleep)., Disp: , Rfl:  .  metoprolol tartrate (LOPRESSOR) 25 MG tablet, Take 0.5 tablets (12.5 mg total) by mouth 2 (two) times daily., Disp: 90 tablet, Rfl: 3 .  rosuvastatin (CRESTOR) 20 MG tablet, Take 1 tablet (20 mg total) by mouth daily., Disp: 90 tablet, Rfl: 3  Past Medical History: Past Medical History:  Diagnosis Date  . Arthritis   . Coronary artery disease   . Essential hypertension 04/21/2014  . Hyperlipidemia   . Parkinson's disease ([place]) 04/27/2015   2016   . 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 Stented coronary artery 2011   x3    Tobacco Use: Social History   Tobacco Use  Smoking Status Never Smoker  Smokeless Tobacco Never Used    Labs: Recent Review Flowsheet Data    Labs for ITP Cardiac and Pulmonary Rehab Latest Ref Rng & Units 02/25/2018 02/25/2018 02/25/2018  02/26/2018 03/10/2018   Cholestrol 100 - 199 mg/dL - - - - -   LDLCALC 0 - 99 mg/dL - - - - -   LDLDIRECT mg/dL - - - - -   HDL >39 mg/dL - - - - -   Trlycerides 0 - 149 mg/dL - - - - -   Hemoglobin A1c 4.8 - 5.6 % - - - - -   PHART 7.350 - 7.450 7.310(L) 7.370 - 7.379 -   PCO2ART 32.0 - 48.0 mmHg 41.9 45.4 - 41.4 -   HCO3 20.0 - 28.0 mmol/L 21.1 26.2 - 23.6 -   TCO2 22 - 32 mmol/L 22 28 23 24 23    ACIDBASEDEF 0.0 - 2.0 mmol/L 5.0(H) - - 0.7 -   O2SAT % 95.0 98.0 - 92.3 -      Capillary Blood Glucose: Lab Results  Component Value Date   GLUCAP 146 (H) 02/27/2018   GLUCAP 129 (H) 02/27/2018   GLUCAP 141 (H) 02/27/2018   GLUCAP 120 (H) 02/26/2018   GLUCAP 158 (H) 02/26/2018     Exercise Target Goals:    Exercise Program Goal: Individual exercise prescription set using results from initial 6 min walk test and THRR while considering  patient's activity barriers and safety.   Exercise Prescription Goal: Initial exercise prescription builds to 30-45 minutes a day  of aerobic activity, 2-3 days per week.  Home exercise guidelines will be given to patient during program as part of exercise prescription that the participant will acknowledge.  Activity Barriers & Risk Stratification: Activity Barriers & Cardiac Risk Stratification - 04/30/18 0814    Activity Barriers & Cardiac Risk Stratification          Activity Barriers  Muscular Weakness;Deconditioning;Incisional Pain;Shortness of Breath    Cardiac Risk Stratification  High           6 Minute Walk: 6 Minute Walk    6 Minute Walk    Row Name 04/30/18 1130 04/30/18 1139   Phase  Initial  no documentation   Distance  no documentation  1056 feet   Walk Time  no documentation  6 minutes   # of Rest Breaks  no documentation  0   MPH  no documentation  2   METS  no documentation  1.7   RPE  no documentation  11   VO2 Peak  no documentation  5.8   Symptoms  no documentation  Yes (comment)   Comments  no documentation   abdominal discomfort   Resting HR  no documentation  63 bpm   Resting BP  no documentation  118/64   Resting Oxygen Saturation   no documentation  97 %   Exercise Oxygen Saturation  during 6 min walk  no documentation  99 %   Max Ex. HR  no documentation  74 bpm   Max Ex. BP  no documentation  124/80   2 Minute Post BP  no documentation  118/64          Oxygen Initial Assessment:   Oxygen Re-Evaluation:   Oxygen Discharge (Final Oxygen Re-Evaluation):   Initial Exercise Prescription: Initial Exercise Prescription - 04/30/18 1100    Date of Initial Exercise RX and Referring Provider          Date  04/30/18    Referring Provider  Daneen Schick MD        Recumbant Bike          Level  1.5    Minutes  10    METs  1.5        NuStep          Level  2    SPM  60    Minutes  10    METs  1.8        Track          Laps  7    Minutes  10    METs  2.23        Prescription Details          Frequency (times per week)  3    Duration  Progress to 30 minutes of continuous aerobic without signs/symptoms of physical distress        Intensity          THRR 40-80% of Max Heartrate  57-114    Ratings of Perceived Exertion  11-13    Perceived Dyspnea  0-4        Progression          Progression  Continue to progress workloads to maintain intensity without signs/symptoms of physical distress.        Resistance Training          Training Prescription  Yes    Weight  2lbs    Reps  10-15  Perform Capillary Blood Glucose checks as needed.  Exercise Prescription Changes: Exercise Prescription Changes    Response to Exercise    Row Name 05/04/18 1446 05/05/18 1400   Blood Pressure (Admit)  122/60  no documentation   Blood Pressure (Exercise)  140/80  no documentation   Blood Pressure (Exit)  122/70  no documentation   Heart Rate (Admit)  54 bpm  no documentation   Heart Rate (Exercise)  70 bpm  no documentation   Heart Rate (Exit)  49 bpm  no  documentation   Rating of Perceived Exertion (Exercise)  13  no documentation   Symptoms  none  no documentation   Comments  pt was oriented to exercise equipment  no documentation   Duration  Continue with 30 min of aerobic exercise without signs/symptoms of physical distress.  no documentation   Intensity  THRR unchanged  no documentation       Progression    Row Name 05/04/18 1446 05/05/18 1400   Progression  Continue to progress workloads to maintain intensity without signs/symptoms of physical distress.  no documentation   Average METs  2.4  no documentation       Resistance Training    Row Name 05/04/18 1446 05/05/18 1400   Training Prescription  Yes  no documentation   Weight  2lbs  no documentation   Reps  10-15  no documentation   Time  10 Minutes  no documentation       [place] Name 05/04/18 1446 05/05/18 1400   Level  1.5  no documentation   Minutes  10  no documentation   METs  2.5  no documentation       NuStep    Row Name 05/04/18 1446 05/05/18 1400   Level  2  no documentation   SPM  60  no documentation   Minutes  10  no documentation   METs  1.8  no documentation       Track    Row Name 05/04/18 1446 05/05/18 1400   Laps  11  no documentation   Minutes  10  no documentation   METs  2.92  no documentation          Exercise Comments: Exercise Comments    Row Name 05/04/18 1443   Exercise Comments  Pt was oriented to exercise equipment. Pt was able to exercise safely for 30 minutes without any signs/symptoms of physical distress.      Exercise Goals and Review: Exercise Goals    Exercise Goals    Row Name 04/30/18 0816   Increase Physical Activity  Yes   Intervention  Provide advice, education, support and counseling about physical activity/exercise needs.;Develop an individualized exercise prescription for aerobic and resistive training based on initial evaluation findings, risk stratification, comorbidities and participant's  personal goals.   Expected Outcomes  Short Term: Attend rehab on a regular basis to increase amount of physical activity.;Long Term: Exercising regularly at least 3-5 days a week.;Long Term: Add in home exercise to make exercise part of routine and to increase amount of physical activity.   Increase Strength and Stamina  Yes   Intervention  Provide advice, education, support and counseling about physical activity/exercise needs.;Develop an individualized exercise prescription for aerobic and resistive training based on initial evaluation findings, risk stratification, comorbidities and participant's personal goals.   Expected Outcomes  Short Term: Increase workloads from initial exercise prescription for resistance, speed, and METs.;Long Term: Improve cardiorespiratory  fitness, muscular endurance and strength as measured by increased METs and functional capacity (6MWT);Short Term: Perform resistance training exercises routinely during rehab and add in resistance training at home   Able to understand and use rate of perceived exertion (RPE) scale  Yes   Intervention  Provide education and explanation on how to use RPE scale   Expected Outcomes  Short Term: Able to use RPE daily in rehab to express subjective intensity level;Long Term:  Able to use RPE to guide intensity level when exercising independently   Knowledge and understanding of Target Heart Rate Range (THRR)  Yes   Intervention  Provide education and explanation of THRR including how the numbers were predicted and where they are located for reference   Expected Outcomes  Short Term: Able to state/look up THRR;Long Term: Able to use THRR to govern intensity when exercising independently;Short Term: Able to use daily as guideline for intensity in rehab   Able to check pulse independently  Yes   Intervention  Provide education and demonstration on how to check pulse in carotid and radial arteries.;Review the importance of being able to check your  own pulse for safety during independent exercise   Expected Outcomes  Short Term: Able to explain why pulse checking is important during independent exercise;Long Term: Able to check pulse independently and accurately   Understanding of Exercise Prescription  Yes   Intervention  Provide education, explanation, and written materials on patient's individual exercise prescription   Expected Outcomes  Long Term: Able to explain home exercise prescription to exercise independently          Exercise Goals Re-Evaluation : Exercise Goals Re-Evaluation    Exercise Goal Re-Evaluation    [place] Name 05/05/18 1444   Exercise Goals Review  Increase Physical Activity;Able to understand and use rate of perceived exertion (RPE) scale   Comments  Pt demonstrated proper use of RPE scale and was able to exercise for 30 minutes without difficulty.    Expected Outcomes  Pt will improve in cardiorespiratory fitness and musculoskeletal endurance.            Discharge Exercise Prescription (Final Exercise Prescription Changes): Exercise Prescription Changes - 05/05/18 1400    Response to Exercise          Blood Pressure (Admit)  --    Blood Pressure (Exercise)  --    Blood Pressure (Exit)  --    Heart Rate (Admit)  --    Heart Rate (Exercise)  --    Heart Rate (Exit)  --    Rating of Perceived Exertion (Exercise)  --    Symptoms  --    Comments  --    Duration  --    Intensity  --        Progression          Progression  --    Average METs  --        Resistance Training          Training Prescription  --    Weight  --    Reps  --    Time  --        Recumbant Bike          Level  --    Minutes  --    METs  --        NuStep          Level  --    SPM  --    Minutes  --  METs  --        Track          Laps  --    Minutes  --    METs  --           Nutrition:  Target Goals: Understanding of nutrition guidelines, daily intake of sodium 1500mg , cholesterol 200mg ,  calories 30% from fat and 7% or less from saturated fats, daily to have 5 or more servings of fruits and vegetables.  Biometrics: Pre Biometrics - 04/30/18 1145    Pre Biometrics          Height  5\' 10"  (1.778 m)    Weight  211 lb 13.8 oz (96.1 kg)    Waist Circumference  42.75 inches    Hip Circumference  44 inches    Waist to Hip Ratio  0.97 %    BMI (Calculated)  30.4    Triceps Skinfold  17 mm    % Body Fat  28.9 %    Grip Strength  32 kg    Flexibility  7.5 in    Single Leg Stand  13 seconds            Nutrition Therapy Plan and Nutrition Goals: Nutrition Therapy & Goals - 04/30/18 1157    Nutrition Therapy          Diet  Heart Healthy        Personal Nutrition Goals          Nutrition Goal  Pt to identify and limit food sources of trans fat and sodium        Intervention Plan          Intervention  Prescribe, educate and counsel regarding individualized specific dietary modifications aiming towards targeted core components such as weight, hypertension, lipid management, diabetes, heart failure and other comorbidities.    Expected Outcomes  Short Term Goal: Understand basic principles of dietary content, such as calories, fat, sodium, cholesterol and nutrients.;Long Term Goal: Adherence to prescribed nutrition plan.           Nutrition Assessments: Nutrition Assessments - 04/30/18 1157    MEDFICTS Scores          Pre Score  41           Nutrition Goals Re-Evaluation:   Nutrition Goals Re-Evaluation:   Nutrition Goals Discharge (Final Nutrition Goals Re-Evaluation):   Psychosocial: Target Goals: Acknowledge presence or absence of significant depression and/or stress, maximize coping skills, provide positive support system. Participant is able to verbalize types and ability to use techniques and skills needed for reducing stress and depression.  Initial Review & Psychosocial Screening: Initial Psych Review & Screening - 04/30/18 1223    Initial  Review          Current issues with  None Identified        Family Dynamics          Good Support System?  Yes Bill's wife is a source of support for him.  His wife was present at today's session.        Barriers          Psychosocial barriers to participate in program  There are no identifiable barriers or psychosocial needs.        Screening Interventions          Interventions  Encouraged to exercise           Quality of Life Scores: Quality of Life - 04/30/18 1152  Quality of Life Scores          Health/Function Pre  20.27 %    Socioeconomic Pre  27.21 %    Psych/Spiritual Pre  24.86 %    Family Pre  27 %    GLOBAL Pre  23.53 %          Scores of 19 and below usually indicate a poorer quality of life in these areas.  A difference of  2-3 points is a clinically meaningful difference.  A difference of 2-3 points in the total score of the Quality of Life Index has been associated with significant improvement in overall quality of life, self-image, physical symptoms, and general health in studies assessing change in quality of life.  PHQ-9: Recent Review Flowsheet Data    Depression screen [facility] 2/9 05/04/2018   Decreased Interest 0   Down, Depressed, Hopeless 0   PHQ - 2 Score 0     Interpretation of Total Score  Total Score Depression Severity:  1-4 = Minimal depression, 5-9 = Mild depression, 10-14 = Moderate depression, 15-19 = Moderately severe depression, 20-27 = Severe depression   Psychosocial Evaluation and Intervention: Psychosocial Evaluation - 05/04/18 1032    Psychosocial Evaluation & Interventions          Interventions  Encouraged to exercise with the program and follow exercise prescription    Comments  no psychosocial needs identified, no interventions necessary. pt enjoys hiking in his neighborhood.     Expected Outcomes  pt will exhibit positive outlook with good coping skills.     Continue Psychosocial Services   No Follow up required            Psychosocial Re-Evaluation: Psychosocial Re-Evaluation    Psychosocial Re-Evaluation    [place] Name 05/07/18 1100   Current issues with  None Identified   Comments  no psychosocial needs identified, no interventions necessary   Expected Outcomes  pt will exhibit positive outlook with good coping skills.    Interventions  Encouraged to attend Cardiac Rehabilitation for the exercise   Continue Psychosocial Services   No Follow up required          Psychosocial Discharge (Final Psychosocial Re-Evaluation): Psychosocial Re-Evaluation - 05/07/18 1100    Psychosocial Re-Evaluation          Current issues with  None Identified    Comments  no psychosocial needs identified, no interventions necessary    Expected Outcomes  pt will exhibit positive outlook with good coping skills.     Interventions  Encouraged to attend Cardiac Rehabilitation for the exercise    Continue Psychosocial Services   No Follow up required           Vocational Rehabilitation: Provide vocational rehab assistance to qualifying candidates.   Vocational Rehab Evaluation & Intervention: Vocational Rehab - 04/30/18 1222    Initial Vocational Rehab Evaluation & Intervention          Assessment shows need for Vocational Rehabilitation  No           Education: Education Goals: Education classes will be provided on a weekly basis, covering required topics. Participant will state understanding/return demonstration of topics presented.  Learning Barriers/Preferences: Learning Barriers/Preferences - 04/30/18 4193    Learning Barriers/Preferences          Learning Barriers  Sight    Learning Preferences  Skilled Demonstration;Verbal Instruction;Written Material;Video           Education Topics: Count Your Pulse:  -Group instruction  provided by verbal instruction, demonstration, patient participation and written materials to support subject.  Instructors address importance of being able to find your  pulse and how to count your pulse when at home without a heart monitor.  Patients get hands on experience counting their pulse with staff help and individually.   Heart Attack, Angina, and Risk Factor Modification:  -Group instruction provided by verbal instruction, video, and written materials to support subject.  Instructors address signs and symptoms of angina and heart attacks.    Also discuss risk factors for heart disease and how to make changes to improve heart health risk factors.   Functional Fitness:  -Group instruction provided by verbal instruction, demonstration, patient participation, and written materials to support subject.  Instructors address safety measures for doing things around the house.  Discuss how to get up and down off the floor, how to pick things up properly, how to safely get out of a chair without assistance, and balance training.   Meditation and Mindfulness:  -Group instruction provided by verbal instruction, patient participation, and written materials to support subject.  Instructor addresses importance of mindfulness and meditation practice to help reduce stress and improve awareness.  Instructor also leads participants through a meditation exercise.    Stretching for Flexibility and Mobility:  -Group instruction provided by verbal instruction, patient participation, and written materials to support subject.  Instructors lead participants through series of stretches that are designed to increase flexibility thus improving mobility.  These stretches are additional exercise for major muscle groups that are typically performed during regular warm up and cool down.   Hands Only CPR:  -Group verbal, video, and participation provides a basic overview of AHA guidelines for community CPR. Role-play of emergencies allow participants the opportunity to practice calling for help and chest compression technique with discussion of AED use.   Hypertension: -Group verbal  and written instruction that provides a basic overview of hypertension including the most recent diagnostic guidelines, risk factor reduction with self-care instructions and medication management.    Nutrition I class: Heart Healthy Eating:  -Group instruction provided by PowerPoint slides, verbal discussion, and written materials to support subject matter. The instructor gives an explanation and review of the Therapeutic Lifestyle Changes diet recommendations, which includes a discussion on lipid goals, dietary fat, sodium, fiber, plant stanol/sterol esters, sugar, and the components of a well-balanced, healthy diet. Flowsheet Row CARDIAC REHAB PHASE II EXERCISE from 05/06/2018 in [facility]  Date  04/30/18  Educator  RD      Nutrition II class: Lifestyle Skills:  -Group instruction provided by PowerPoint slides, verbal discussion, and written materials to support subject matter. The instructor gives an explanation and review of label reading, grocery shopping for heart health, heart healthy recipe modifications, and ways to make healthier choices when eating out.   Diabetes Question & Answer:  -Group instruction provided by PowerPoint slides, verbal discussion, and written materials to support subject matter. The instructor gives an explanation and review of diabetes co-morbidities, pre- and post-prandial blood glucose goals, pre-exercise blood glucose goals, signs, symptoms, and treatment of hypoglycemia and hyperglycemia, and foot care basics.   Diabetes Blitz:  -Group instruction provided by PowerPoint slides, verbal discussion, and written materials to support subject matter. The instructor gives an explanation and review of the physiology behind type 1 and type 2 diabetes, diabetes medications and rational behind using different medications, pre- and post-prandial blood glucose recommendations and Hemoglobin A1c goals, diabetes diet, and exercise  including  blood glucose guidelines for exercising safely.    Portion Distortion:  -Group instruction provided by PowerPoint slides, verbal discussion, written materials, and food models to support subject matter. The instructor gives an explanation of serving size versus portion size, changes in portions sizes over the last 20 years, and what consists of a serving from each food group.   Stress Management:  -Group instruction provided by verbal instruction, video, and written materials to support subject matter.  Instructors review role of stress in heart disease and how to cope with stress positively.     Exercising on Your Own:  -Group instruction provided by verbal instruction, power point, and written materials to support subject.  Instructors discuss benefits of exercise, components of exercise, frequency and intensity of exercise, and end points for exercise.  Also discuss use of nitroglycerin and activating EMS.  Review options of places to exercise outside of rehab.  Review guidelines for sex with heart disease.   Cardiac Drugs I:  -Group instruction provided by verbal instruction and written materials to support subject.  Instructor reviews cardiac drug classes: antiplatelets, anticoagulants, beta blockers, and statins.  Instructor discusses reasons, side effects, and lifestyle considerations for each drug class.   Cardiac Drugs II:  -Group instruction provided by verbal instruction and written materials to support subject.  Instructor reviews cardiac drug classes: angiotensin converting enzyme inhibitors (ACE-I), angiotensin II receptor blockers (ARBs), nitrates, and calcium channel blockers.  Instructor discusses reasons, side effects, and lifestyle considerations for each drug class. Flowsheet Row CARDIAC REHAB PHASE II EXERCISE from 05/06/2018 in [facility]  Date  05/06/18  Instruction Review Code  2- Demonstrated Understanding      Anatomy and Physiology  of the Circulatory System:  Group verbal and written instruction and models provide basic cardiac anatomy and physiology, with the coronary electrical and arterial systems. Review of: AMI, Angina, Valve disease, Heart Failure, Peripheral Artery Disease, Cardiac Arrhythmia, Pacemakers, and the ICD.   Other Education:  -Group or individual verbal, written, or video instructions that support the educational goals of the cardiac rehab program.   Holiday Eating Survival Tips:  -Group instruction provided by PowerPoint slides, verbal discussion, and written materials to support subject matter. The instructor gives patients tips, tricks, and techniques to help them not only survive but enjoy the holidays despite the onslaught of food that accompanies the holidays.   Knowledge Questionnaire Score: Knowledge Questionnaire Score - 04/30/18 1129    Knowledge Questionnaire Score          Pre Score  18/24           Core Components/Risk Factors/Patient Goals at Admission: Personal Goals and Risk Factors at Admission - 04/30/18 1146    Core Components/Risk Factors/Patient Goals on Admission           Weight Management  Yes;Obesity    Intervention  Weight Management: Develop a combined nutrition and exercise program designed to reach desired caloric intake, while maintaining appropriate intake of nutrient and fiber, sodium and fats, and appropriate energy expenditure required for the weight goal.;Weight Management: Provide education and appropriate resources to help participant work on and attain dietary goals.;Weight Management/Obesity: Establish reasonable short term and long term weight goals.    Admit Weight  211 lb 13.8 oz (96.1 kg)    Goal Weight: Short Term  200 lb (90.7 kg)    Goal Weight: Long Term  190 lb (86.2 kg)    Expected Outcomes  Short Term: Continue  to assess and modify interventions until short term weight is achieved;Long Term: Adherence to nutrition and physical activity/exercise  program aimed toward attainment of established weight goal;Weight Maintenance: Understanding of the daily nutrition guidelines, which includes 25-35% calories from fat, 7% or less cal from saturated fats, less than 200mg  cholesterol, less than 1.5gm of sodium, & 5 or more servings of fruits and vegetables daily;Weight Loss: Understanding of general recommendations for a balanced deficit meal plan, which promotes 1-2 lb weight loss per week and includes a negative energy balance of [id number] kcal/d;Understanding recommendations for meals to include 15-35% energy as protein, 25-35% energy from fat, 35-60% energy from carbohydrates, less than 200mg  of dietary cholesterol, 20-35 gm of total fiber daily;Understanding of distribution of calorie intake throughout the day with the consumption of 4-5 meals/snacks    Heart Failure  Yes    Intervention  Provide a combined exercise and nutrition program that is supplemented with education, support and counseling about heart failure. Directed toward relieving symptoms such as shortness of breath, decreased exercise tolerance, and extremity edema.    Expected Outcomes  Improve functional capacity of life;Short term: Attendance in program 2-3 days a week with increased exercise capacity. Reported lower sodium intake. Reported increased fruit and vegetable intake. Reports medication compliance.;Short term: Daily weights obtained and reported for increase. Utilizing diuretic protocols set by physician.;Long term: Adoption of self-care skills and reduction of barriers for early signs and symptoms recognition and intervention leading to self-care maintenance.    Hypertension  Yes    Intervention  Provide education on lifestyle modifcations including regular physical activity/exercise, weight management, moderate sodium restriction and increased consumption of fresh fruit, vegetables, and low fat dairy, alcohol moderation, and smoking cessation.;Monitor prescription use compliance.     Expected Outcomes  Short Term: Continued assessment and intervention until BP is < 140/72mm HG in hypertensive participants. < 130/74mm HG in hypertensive participants with diabetes, heart failure or chronic kidney disease.;Long Term: Maintenance of blood pressure at goal levels.    Lipids  Yes    Intervention  Provide education and support for participant on nutrition & aerobic/resistive exercise along with prescribed medications to achieve LDL 70mg , HDL >40mg .    Expected Outcomes  Short Term: Participant states understanding of desired cholesterol values and is compliant with medications prescribed. Participant is following exercise prescription and nutrition guidelines.;Long Term: Cholesterol controlled with medications as prescribed, with individualized exercise RX and with personalized nutrition plan. Value goals: LDL < 70mg , HDL > 40 mg.           Core Components/Risk Factors/Patient Goals Review:  Goals and Risk Factor Review    Core Components/Risk Factors/Patient Goals Review    Row Name 05/04/18 1026   Personal Goals Review  Weight Management/Obesity;Heart Failure;Hypertension;Lipids   Review  pt with multiple CAD RF demonstrates willingness to participate in CR exercise program. pt personal goal is lose weight to 180lb. pt encouraged to participate in CR exercise,nutrition and risk factor education classes.    Expected Outcomes  pt will participate in CR Exercise, nutrition and lifestyle modification opportunities.           Core Components/Risk Factors/Patient Goals at Discharge (Final Review):  Goals and Risk Factor Review - 05/04/18 1026    Core Components/Risk Factors/Patient Goals Review          Personal Goals Review  Weight Management/Obesity;Heart Failure;Hypertension;Lipids    Review  pt with multiple CAD RF demonstrates willingness to participate in CR exercise program. pt personal goal is lose  weight to 180lb. pt encouraged to participate in CR exercise,nutrition  and risk factor education classes.     Expected Outcomes  pt will participate in CR Exercise, nutrition and lifestyle modification opportunities.            ITP Comments: ITP Comments    Row Name 04/30/18 4859 05/04/18 1024 05/07/18 1058   ITP Comments  Dr. Fransico Him, Medical Director  pt started group exercise session today.  pt tolerated light activity without difficulty.  pt oriented to exercise equipment and safety routine.    30 day ITP review.  pt demonstrates willingness to participate in group exercise program.      Comments:

## 2018-05-08 ENCOUNTER — Encounter (HOSPITAL_COMMUNITY)
Admission: RE | Admit: 2018-05-08 | Discharge: 2018-05-08 | Disposition: A | Discharge status: Home / Self Care | Payer: Medicare Other | Source: Ambulatory Visit | Attending: Interventional Cardiology | Admitting: Interventional Cardiology

## 2018-05-08 DIAGNOSIS — Z951 Presence of aortocoronary bypass graft: Secondary | ICD-10-CM | POA: Diagnosis not present

## 2018-05-08 DIAGNOSIS — Z48812 Encounter for surgical aftercare following surgery on the circulatory system: Secondary | ICD-10-CM | POA: Diagnosis not present

## 2018-05-11 ENCOUNTER — Encounter (HOSPITAL_COMMUNITY)
Admission: RE | Admit: 2018-05-11 | Discharge: 2018-05-11 | Disposition: A | Discharge status: Home / Self Care | Payer: Medicare Other | Source: Ambulatory Visit | Attending: Interventional Cardiology | Admitting: Interventional Cardiology

## 2018-05-11 DIAGNOSIS — Z951 Presence of aortocoronary bypass graft: Secondary | ICD-10-CM | POA: Diagnosis not present

## 2018-05-11 DIAGNOSIS — Z48812 Encounter for surgical aftercare following surgery on the circulatory system: Secondary | ICD-10-CM | POA: Diagnosis not present

## 2018-05-11 NOTE — Progress Notes (Signed)
Reviewed home exercise with pt today.  Pt plans to walk or do silver sneakers for exercise, 2x/week in addition to coming to cardiac rehab.  Reviewed THR, pulse, RPE, sign and symptoms, NTG use, and when to call 911 or MD.  Also discussed weather considerations and indoor options.  Pt voiced understanding.   Calvin Ramirez [employer]

## 2018-05-12 DIAGNOSIS — I10 Essential (primary) hypertension: Secondary | ICD-10-CM | POA: Diagnosis not present

## 2018-05-12 DIAGNOSIS — E7800 Pure hypercholesterolemia, unspecified: Secondary | ICD-10-CM | POA: Diagnosis not present

## 2018-05-12 DIAGNOSIS — I2510 Atherosclerotic heart disease of native coronary artery without angina pectoris: Secondary | ICD-10-CM | POA: Diagnosis not present

## 2018-05-12 DIAGNOSIS — E538 Deficiency of other specified B group vitamins: Secondary | ICD-10-CM | POA: Diagnosis not present

## 2018-05-13 ENCOUNTER — Encounter (HOSPITAL_COMMUNITY)
Admission: RE | Admit: 2018-05-13 | Discharge: 2018-05-13 | Disposition: A | Discharge status: Home / Self Care | Payer: Medicare Other | Source: Ambulatory Visit | Attending: Interventional Cardiology | Admitting: Interventional Cardiology

## 2018-05-13 DIAGNOSIS — Z48812 Encounter for surgical aftercare following surgery on the circulatory system: Secondary | ICD-10-CM | POA: Diagnosis not present

## 2018-05-13 DIAGNOSIS — Z951 Presence of aortocoronary bypass graft: Secondary | ICD-10-CM

## 2018-05-15 ENCOUNTER — Encounter (HOSPITAL_COMMUNITY)
Admission: RE | Admit: 2018-05-15 | Discharge: 2018-05-15 | Disposition: A | Discharge status: Home / Self Care | Payer: Medicare Other | Source: Ambulatory Visit | Attending: Interventional Cardiology | Admitting: Interventional Cardiology

## 2018-05-15 DIAGNOSIS — Z48812 Encounter for surgical aftercare following surgery on the circulatory system: Secondary | ICD-10-CM | POA: Diagnosis not present

## 2018-05-15 DIAGNOSIS — Z951 Presence of aortocoronary bypass graft: Secondary | ICD-10-CM | POA: Diagnosis not present

## 2018-05-15 NOTE — Progress Notes (Signed)
Calvin Ramirez 77 y.o. male Nutrition Note Spoke with pt. Nutrition Plan and Nutrition Survey goals reviewed with pt. Pt is following a Heart Healthy diet. Pt wants to lose wt. Pt has been trying to lose wt by watching portion size of foods eaten, limiting consumption of sweets/ice cream/baked goods to 1x a month. Additional wt loss tips reviewed. Encouraged pt to incorporate alternate cooking styles such as baking grilling broiling in place of deep fried foods. Additionally discussed with patient moderating portion size of sweets/baked goods/icream when eaten 1x a month.  Pt expressed understanding of the information reviewed. Pt aware of nutrition education classes offered.  Lab Results  Component Value Date   HGBA1C 5.5 02/24/2018    Wt Readings from Last 3 Encounters:  04/30/18 211 lb 13.8 oz (96.1 kg)  04/24/18 211 lb (95.7 kg)  04/06/18 201 lb (91.2 kg)    Nutrition Diagnosis ? Food-and nutrition-related knowledge deficit related to lack of exposure to information as related to diagnosis of: ? CVD  ? Obesity related to excessive energy intake as evidenced by a BMI of 30.4 kg/m2  Nutrition Intervention ? Pt's individual nutrition plan reviewed with pt.  Goal(s)  ? Pt to identify and limit food sources of saturated fat, trans fat, and sodium  Plan:  Pt to attend nutrition classes ? Nutrition I ? Nutrition II ? Portion Distortion  Will provide client-centered nutrition education as part of interdisciplinary care.   Monitor and evaluate progress toward nutrition goal with team    Laurina Bustle, MS, RD, LDN 05/15/2018 9:33 AM

## 2018-05-18 ENCOUNTER — Encounter (HOSPITAL_COMMUNITY)
Admission: RE | Admit: 2018-05-18 | Discharge: 2018-05-18 | Disposition: A | Discharge status: Home / Self Care | Payer: Medicare Other | Source: Ambulatory Visit | Attending: Interventional Cardiology | Admitting: Interventional Cardiology

## 2018-05-18 DIAGNOSIS — Z951 Presence of aortocoronary bypass graft: Secondary | ICD-10-CM | POA: Diagnosis not present

## 2018-05-18 DIAGNOSIS — Z48812 Encounter for surgical aftercare following surgery on the circulatory system: Secondary | ICD-10-CM | POA: Diagnosis not present

## 2018-05-20 ENCOUNTER — Encounter (HOSPITAL_COMMUNITY)
Admission: RE | Admit: 2018-05-20 | Discharge: 2018-05-20 | Disposition: A | Discharge status: Home / Self Care | Payer: Medicare Other | Source: Ambulatory Visit | Attending: Interventional Cardiology | Admitting: Interventional Cardiology

## 2018-05-20 DIAGNOSIS — D1801 Hemangioma of skin and subcutaneous tissue: Secondary | ICD-10-CM | POA: Diagnosis not present

## 2018-05-20 DIAGNOSIS — D225 Melanocytic nevi of trunk: Secondary | ICD-10-CM | POA: Diagnosis not present

## 2018-05-20 DIAGNOSIS — Z951 Presence of aortocoronary bypass graft: Secondary | ICD-10-CM | POA: Diagnosis not present

## 2018-05-20 DIAGNOSIS — L738 Other specified follicular disorders: Secondary | ICD-10-CM | POA: Diagnosis not present

## 2018-05-20 DIAGNOSIS — L814 Other melanin hyperpigmentation: Secondary | ICD-10-CM | POA: Diagnosis not present

## 2018-05-20 DIAGNOSIS — D2271 Melanocytic nevi of right lower limb, including hip: Secondary | ICD-10-CM | POA: Diagnosis not present

## 2018-05-20 DIAGNOSIS — D2272 Melanocytic nevi of left lower limb, including hip: Secondary | ICD-10-CM | POA: Diagnosis not present

## 2018-05-20 DIAGNOSIS — L570 Actinic keratosis: Secondary | ICD-10-CM | POA: Diagnosis not present

## 2018-05-20 DIAGNOSIS — Z48812 Encounter for surgical aftercare following surgery on the circulatory system: Secondary | ICD-10-CM | POA: Diagnosis not present

## 2018-05-20 DIAGNOSIS — L821 Other seborrheic keratosis: Secondary | ICD-10-CM | POA: Diagnosis not present

## 2018-05-20 DIAGNOSIS — D2261 Melanocytic nevi of right upper limb, including shoulder: Secondary | ICD-10-CM | POA: Diagnosis not present

## 2018-05-21 NOTE — Progress Notes (Signed)
Cardiology Office Note    Date:  05/22/2018   ID:  Calvin Ramirez, DOB Aug 22, 1941, MRN [id number]  PCP:  Shirline Frees, MD  Cardiologist: Sinclair Grooms, MD   Chief Complaint  Patient presents with  . Coronary Artery Disease  . Congestive Heart Failure    History of Present Illness:  Calvin Ramirez is a 77 y.o. male  with history of CAD, recent CABG with LIMA to LAD and SVG to OM for left main disease, hyperlipidemia, Parkinson's disease, postop atrial fibrillation, essential hypertension, and diastolic heart failure in the setting of acute ischemia caused by left main disease. Recent acute on chronic combined systolic and diastolic HF treated with diuretics.  Required addition of furosemide for volume control.  Diastolic heart failure has resolved.  Weight came down approximately 8 pounds.  He denies orthopnea.  No exertional dyspnea.  No chest pain.  Overall quite happy with his progress.   Past Medical History:  Diagnosis Date  . Acute diastolic (congestive) heart failure ([facility]) 02/13/2018  . Arthritis   . 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 CAD (coronary artery disease) 02/24/2018  . CAD in native artery 04/26/2015   2011 Multivessel DES PCI LAD,RCA PLOM and distal RCA   . Coronary artery disease   . Dementia   . Essential hypertension 04/21/2014  . Hyperlipidemia   . Parkinson's disease ([place]) 04/27/2015   2016   . Paroxysmal atrial fibrillation ([place]) 03/17/2018  . 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 Stented coronary artery 2011   x3    Past Surgical History:  Procedure Laterality Date  . APPENDECTOMY    . CARDIAC CATHETERIZATION  2011   3 stents  . COLONOSCOPY    . CORONARY ARTERY BYPASS GRAFT N/A 02/25/2018   Procedure: CORONARY ARTERY BYPASS GRAFTING (CABG), ON PUMP, TIMES TWO, USING LEFT INTERNAL MAMMARY ARTERY AND ENDOSCOPICALLY HARVESTED RIGHT GREATER SAPHENOUS VEIN;  Surgeon: Rexene Alberts, MD;   Location: [facility];  Service: Open Heart Surgery;  Laterality: N/A;  . RIGHT/LEFT HEART CATH AND CORONARY ANGIOGRAPHY N/A 02/24/2018   Procedure: RIGHT/LEFT HEART CATH AND CORONARY ANGIOGRAPHY;  Surgeon: Belva Crome, MD;  Location: [facility];  Service: Cardiovascular;  Laterality: N/A;  . TEE WITHOUT CARDIOVERSION N/A 02/25/2018   Procedure: TRANSESOPHAGEAL ECHOCARDIOGRAM (TEE);  Surgeon: Rexene Alberts, MD;  Location: [facility];  Service: Open Heart Surgery;  Laterality: N/A;  . TONSILLECTOMY      Current Medications: Outpatient Medications Prior to Visit  Medication Sig Dispense Refill  . aspirin EC 325 MG tablet Take 325 mg by mouth daily.    . furosemide (LASIX) 40 MG tablet Take 1 tablet (40 mg total) by mouth daily. 90 tablet 3  . losartan (COZAAR) 50 MG tablet Take 1 tablet (50 mg total) by mouth daily. 90 tablet 3  . Melatonin 5 MG CAPS Take 1 capsule by mouth as needed (sleep).    . metoprolol tartrate (LOPRESSOR) 25 MG tablet Take 0.5 tablets (12.5 mg total) by mouth 2 (two) times daily. 90 tablet 3  . rosuvastatin (CRESTOR) 20 MG tablet Take 1 tablet (20 mg total) by mouth daily. 90 tablet 3  . amiodarone (PACERONE) 200 MG tablet Take 1 tablet (200 mg total) by mouth daily for 17 days. Stop taking on 04/03/18 30 tablet 0   No facility-administered medications prior to visit.  Allergies:   Levaquin [levofloxacin hemihydrate] and Penicillins   Social History   Socioeconomic History  . Marital status: Married    Spouse name: Not on file  . Number of children: Not on file  . Years of education: Not on file  . Highest education level: Not on file  Occupational History  . Not on file  Social Needs  . Financial resource strain: Not on file  . Food insecurity:    Worry: Not on file    Inability: Not on file  . Transportation needs:    Medical: Not on file    Non-medical: Not on file  Tobacco Use  . Smoking status: Never Smoker  . Smokeless tobacco: Never Used    Substance and Sexual Activity  . Alcohol use: Yes    Alcohol/week: 0.0 oz    Comment: occ  . Drug use: No  . Sexual activity: Not on file  Lifestyle  . Physical activity:    Days per week: Not on file    Minutes per session: Not on file  . Stress: Not on file  Relationships  . Social connections:    Talks on phone: Not on file    Gets together: Not on file    Attends religious service: Not on file    Active member of club or organization: Not on file    Attends meetings of clubs or organizations: Not on file    Relationship status: Not on file  Other Topics Concern  . Not on file  Social History Narrative  . Not on file     Family History:  The patient's family history includes Cancer in his father; Heart attack in his mother.   ROS:   Please see the history of present illness.    He has lost 8 pounds since furosemide was initiated.  Initial blood work 3 days after starting was adequate. All other systems reviewed and are negative.   PHYSICAL EXAM:   VS:  BP 120/72   Pulse 67   Ht _0  (1.778 m)   Wt 201 lb 3.2 oz (91.3 kg)   BMI 28.87 kg/m    GEN: Well nourished, well developed, in no acute distress  HEENT: normal  Neck: no JVD, carotid bruits, or masses Cardiac: RRR; no murmurs, rubs, or gallops,no edema  Respiratory:  clear to auscultation bilaterally, normal work of breathing GI: soft, nontender, nondistended, + BS MS: no deformity or atrophy  Skin: warm and dry, no rash Neuro:  Alert and Oriented x 3, Strength and sensation are intact Psych: euthymic mood, full affect  Wt Readings from Last 3 Encounters:  05/22/18 201 lb 3.2 oz (91.3 kg)  04/30/18 211 lb 13.8 oz (96.1 kg)  04/24/18 211 lb (95.7 kg)      Studies/Labs Reviewed:   EKG:  EKG  Not repeated  Recent Labs: 02/26/2018: Magnesium 2.2 03/10/2018: ALT 32 04/24/2018: Hemoglobin 11.1; NT-Pro BNP 3,656; Platelets 228 05/01/2018: BUN 20; Creatinine, Ser 1.11; Potassium 3.6; Sodium 143   Lipid  Panel    Component Value Date/Time   CHOL 160 11/21/2017 0912   TRIG 208 (H) 11/21/2017 0912   HDL 43 11/21/2017 0912   CHOLHDL 3.7 11/21/2017 0912   CHOLHDL 6.2 (H) 09/06/2016 0952   VLDL 57 (H) 09/06/2016 0952   LDLCALC 75 11/21/2017 0912   LDLDIRECT 98.0 04/21/2015 0858    Additional studies/ records that were reviewed today include:  none    ASSESSMENT:    1. Paroxysmal atrial  fibrillation ([facility])   2. Coronary artery disease involving coronary bypass graft of native heart without angina pectoris   3. Hyperlipidemia, unspecified hyperlipidemia type   4. Essential hypertension   5. Acute on chronic diastolic heart failure ([facility])      PLAN:  In order of problems listed above:  1. Resolved and was a postoperative phenomenon 2. Doing well without angina.  Continue 325 mg of aspirin daily until the one year anniversary of bypass surgery. 3. Continue statin therapy with LDL target less than 70 4. Excellent blood pressure control with target 130/80 mmHg. 5. Acute volume overload is resolved.  Needs basic metabolic panel today as we have found an adequate medical regimen to control symptoms.  Clinical follow-up in 6 months.  Continue current medication as listed.  Be met today.  Medication Adjustments/Labs and Tests Ordered: Current medicines are reviewed at length with the patient today.  Concerns regarding medicines are outlined above.  Medication changes, Labs and Tests ordered today are listed in the Patient Instructions below. Patient Instructions  Medication Instructions:  Your physician recommends that you continue on your current medications as directed. Please refer to the Current Medication list given to you today.  Labwork: BMET today  Testing/Procedures: None  Follow-Up: Your physician recommends that you schedule a follow-up appointment in: 6 months with Dr. Tamala Julian.   Any Other Special Instructions Will Be Listed Below (If Applicable).     If you need  a refill on your cardiac medications before your next appointment, please call your pharmacy.      Signed, Sinclair Grooms, MD  05/22/2018 12:17 PM    [facility] [address], [place], [place]  [address] Phone: [phone]; Fax: [phone]

## 2018-05-22 ENCOUNTER — Ambulatory Visit (INDEPENDENT_AMBULATORY_CARE_PROVIDER_SITE_OTHER): Payer: Medicare Other | Admitting: Interventional Cardiology

## 2018-05-22 ENCOUNTER — Encounter: Payer: Self-pay | Admitting: Interventional Cardiology

## 2018-05-22 ENCOUNTER — Encounter (HOSPITAL_COMMUNITY)
Admission: RE | Admit: 2018-05-22 | Discharge: 2018-05-22 | Disposition: A | Discharge status: Home / Self Care | Payer: Medicare Other | Source: Ambulatory Visit | Attending: Interventional Cardiology | Admitting: Interventional Cardiology

## 2018-05-22 DIAGNOSIS — I5033 Acute on chronic diastolic (congestive) heart failure: Secondary | ICD-10-CM

## 2018-05-22 DIAGNOSIS — E785 Hyperlipidemia, unspecified: Secondary | ICD-10-CM

## 2018-05-22 DIAGNOSIS — I25810 Atherosclerosis of coronary artery bypass graft(s) without angina pectoris: Secondary | ICD-10-CM

## 2018-05-22 DIAGNOSIS — I480 Paroxysmal atrial fibrillation: Secondary | ICD-10-CM | POA: Diagnosis not present

## 2018-05-22 DIAGNOSIS — I10 Essential (primary) hypertension: Secondary | ICD-10-CM

## 2018-05-22 DIAGNOSIS — I2510 Atherosclerotic heart disease of native coronary artery without angina pectoris: Secondary | ICD-10-CM | POA: Diagnosis not present

## 2018-05-22 DIAGNOSIS — Z951 Presence of aortocoronary bypass graft: Secondary | ICD-10-CM | POA: Diagnosis not present

## 2018-05-22 DIAGNOSIS — Z48812 Encounter for surgical aftercare following surgery on the circulatory system: Secondary | ICD-10-CM | POA: Diagnosis not present

## 2018-05-22 LAB — BASIC METABOLIC PANEL
BUN/Creatinine Ratio: 21 (ref 10–24)
BUN: 23 mg/dL (ref 8–27)
CO2: 25 mmol/L (ref 20–29)
Calcium: 9.9 mg/dL (ref 8.6–10.2)
Chloride: 102 mmol/L (ref 96–106)
Creatinine, Ser: 1.12 mg/dL (ref 0.76–1.27)
GFR calc Af Amer: 73 mL/min/{1.73_m2} (ref >59)
GFR calc non Af Amer: 63 mL/min/{1.73_m2} (ref >59)
Glucose: 104 mg/dL — ABNORMAL HIGH (ref 65–99)
Potassium: 4.5 mmol/L (ref 3.5–5.2)
Sodium: 143 mmol/L (ref 134–144)

## 2018-05-22 NOTE — Patient Instructions (Signed)
Medication Instructions:  Your physician recommends that you continue on your current medications as directed. Please refer to the Current Medication list given to you today.  Labwork: BMET today  Testing/Procedures: None  Follow-Up: Your physician recommends that you schedule a follow-up appointment in: 6 months with Dr. Tamala Julian.   Any Other Special Instructions Will Be Listed Below (If Applicable).     If you need a refill on your cardiac medications before your next appointment, please call your pharmacy.

## 2018-05-25 ENCOUNTER — Encounter (HOSPITAL_COMMUNITY)
Admission: RE | Admit: 2018-05-25 | Discharge: 2018-05-25 | Disposition: A | Discharge status: Home / Self Care | Payer: Medicare Other | Source: Ambulatory Visit | Attending: Interventional Cardiology | Admitting: Interventional Cardiology

## 2018-05-25 ENCOUNTER — Encounter: Payer: Self-pay | Admitting: *Deleted

## 2018-05-25 DIAGNOSIS — Z48812 Encounter for surgical aftercare following surgery on the circulatory system: Secondary | ICD-10-CM | POA: Diagnosis not present

## 2018-05-25 DIAGNOSIS — Z951 Presence of aortocoronary bypass graft: Secondary | ICD-10-CM | POA: Diagnosis not present

## 2018-05-26 ENCOUNTER — Encounter (HOSPITAL_COMMUNITY): Payer: Self-pay

## 2018-05-27 ENCOUNTER — Encounter (HOSPITAL_COMMUNITY)
Admission: RE | Admit: 2018-05-27 | Discharge: 2018-05-27 | Disposition: A | Discharge status: Home / Self Care | Payer: Medicare Other | Source: Ambulatory Visit | Attending: Interventional Cardiology | Admitting: Interventional Cardiology

## 2018-05-27 DIAGNOSIS — Z951 Presence of aortocoronary bypass graft: Secondary | ICD-10-CM | POA: Diagnosis not present

## 2018-05-27 DIAGNOSIS — Z48812 Encounter for surgical aftercare following surgery on the circulatory system: Secondary | ICD-10-CM | POA: Diagnosis not present

## 2018-05-29 ENCOUNTER — Encounter (HOSPITAL_COMMUNITY)
Admission: RE | Admit: 2018-05-29 | Discharge: 2018-05-29 | Disposition: A | Discharge status: Home / Self Care | Payer: Medicare Other | Source: Ambulatory Visit | Attending: Interventional Cardiology | Admitting: Interventional Cardiology

## 2018-05-29 DIAGNOSIS — Z951 Presence of aortocoronary bypass graft: Secondary | ICD-10-CM | POA: Diagnosis not present

## 2018-05-29 DIAGNOSIS — Z48812 Encounter for surgical aftercare following surgery on the circulatory system: Secondary | ICD-10-CM | POA: Diagnosis not present

## 2018-06-01 ENCOUNTER — Encounter (HOSPITAL_COMMUNITY)
Admission: RE | Admit: 2018-06-01 | Discharge: 2018-06-01 | Disposition: A | Discharge status: Home / Self Care | Payer: Medicare Other | Source: Ambulatory Visit | Attending: Interventional Cardiology | Admitting: Interventional Cardiology

## 2018-06-01 DIAGNOSIS — Z951 Presence of aortocoronary bypass graft: Secondary | ICD-10-CM

## 2018-06-01 DIAGNOSIS — Z48812 Encounter for surgical aftercare following surgery on the circulatory system: Secondary | ICD-10-CM | POA: Diagnosis not present

## 2018-06-03 ENCOUNTER — Encounter (HOSPITAL_COMMUNITY)
Admission: RE | Admit: 2018-06-03 | Discharge: 2018-06-03 | Disposition: A | Discharge status: Home / Self Care | Payer: Medicare Other | Source: Ambulatory Visit | Attending: Interventional Cardiology | Admitting: Interventional Cardiology

## 2018-06-03 DIAGNOSIS — Z951 Presence of aortocoronary bypass graft: Secondary | ICD-10-CM

## 2018-06-03 DIAGNOSIS — Z48812 Encounter for surgical aftercare following surgery on the circulatory system: Secondary | ICD-10-CM | POA: Diagnosis not present

## 2018-06-04 ENCOUNTER — Encounter (HOSPITAL_COMMUNITY): Payer: Self-pay

## 2018-06-04 NOTE — Progress Notes (Signed)
Cardiac Individual Treatment Plan  Patient Details  Name: Calvin Ramirez MRN: [id number] Date of Birth: 11-25-1941 Referring Provider:   Flowsheet Row CARDIAC REHAB PHASE II ORIENTATION from 04/30/2018 in [place]  Referring Provider  Daneen Schick MD      Initial Encounter Date:  [place] from 04/30/2018 in [place]  Date  04/30/18      Visit Diagnosis: S/P CABG x 2  Patient's Home Medications on Admission:  Current Outpatient Medications:  .  aspirin EC 325 MG tablet, Take 325 mg by mouth daily., Disp: , Rfl:  .  furosemide (LASIX) 40 MG tablet, Take 1 tablet (40 mg total) by mouth daily., Disp: 90 tablet, Rfl: 3 .  losartan (COZAAR) 50 MG tablet, Take 1 tablet (50 mg total) by mouth daily., Disp: 90 tablet, Rfl: 3 .  Melatonin 5 MG CAPS, Take 1 capsule by mouth as needed (sleep)., Disp: , Rfl:  .  metoprolol tartrate (LOPRESSOR) 25 MG tablet, Take 0.5 tablets (12.5 mg total) by mouth 2 (two) times daily., Disp: 90 tablet, Rfl: 3 .  rosuvastatin (CRESTOR) 20 MG tablet, Take 1 tablet (20 mg total) by mouth daily., Disp: 90 tablet, Rfl: 3  Past Medical History: Past Medical History:  Diagnosis Date  . Acute diastolic (congestive) heart failure ([place]) 02/13/2018  . Arthritis   . 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 CAD (coronary artery disease) 02/24/2018  . CAD in native artery 04/26/2015   2011 Multivessel DES PCI LAD,RCA PLOM and distal RCA   . Coronary artery disease   . Dementia   . Essential hypertension 04/21/2014  . Hyperlipidemia   . Parkinson's disease ([place]) 04/27/2015   2016   . Paroxysmal atrial fibrillation ([place]) 03/17/2018  . 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 Stented coronary artery 2011   x3    Tobacco Use: Social History   Tobacco Use  Smoking Status Never Smoker  Smokeless Tobacco Never Used     Labs: Recent Review Flowsheet Data    Labs for ITP Cardiac and Pulmonary Rehab Latest Ref Rng & Units 02/25/2018 02/25/2018 02/25/2018 02/26/2018 03/10/2018   Cholestrol 100 - 199 mg/dL - - - - -   LDLCALC 0 - 99 mg/dL - - - - -   LDLDIRECT mg/dL - - - - -   HDL >39 mg/dL - - - - -   Trlycerides 0 - 149 mg/dL - - - - -   Hemoglobin A1c 4.8 - 5.6 % - - - - -   PHART 7.350 - 7.450 7.310(L) 7.370 - 7.379 -   PCO2ART 32.0 - 48.0 mmHg 41.9 45.4 - 41.4 -   HCO3 20.0 - 28.0 mmol/L 21.1 26.2 - 23.6 -   TCO2 22 - 32 mmol/L 22 28 23 24 23    ACIDBASEDEF 0.0 - 2.0 mmol/L 5.0(H) - - 0.7 -   O2SAT % 95.0 98.0 - 92.3 -      Capillary Blood Glucose: Lab Results  Component Value Date   GLUCAP 146 (H) 02/27/2018   GLUCAP 129 (H) 02/27/2018   GLUCAP 141 (H) 02/27/2018   GLUCAP 120 (H) 02/26/2018   GLUCAP 158 (H) 02/26/2018     Exercise Target Goals:    Exercise Program Goal: Individual exercise prescription set using results from initial 6 min walk test and THRR while considering  patient's activity barriers and safety.   Exercise Prescription Goal: Initial exercise prescription builds to 30-45 minutes a day of aerobic activity, 2-3 days per week.  Home exercise guidelines will be given to patient during program as part of exercise prescription that the participant will acknowledge.  Activity Barriers & Risk Stratification: Activity Barriers & Cardiac Risk Stratification - 04/30/18 0814    Activity Barriers & Cardiac Risk Stratification          Activity Barriers  Muscular Weakness;Deconditioning;Incisional Pain;Shortness of Breath    Cardiac Risk Stratification  High           6 Minute Walk: 6 Minute Walk    6 Minute Walk    Row Name 04/30/18 1130 04/30/18 1139   Phase  Initial  no documentation   Distance  no documentation  1056 feet   Walk Time  no documentation  6 minutes   # of Rest Breaks  no documentation  0   MPH  no documentation  2   METS  no documentation  1.7   RPE   no documentation  11   VO2 Peak  no documentation  5.8   Symptoms  no documentation  Yes (comment)   Comments  no documentation  abdominal discomfort   Resting HR  no documentation  63 bpm   Resting BP  no documentation  118/64   Resting Oxygen Saturation   no documentation  97 %   Exercise Oxygen Saturation  during 6 min walk  no documentation  99 %   Max Ex. HR  no documentation  74 bpm   Max Ex. BP  no documentation  124/80   2 Minute Post BP  no documentation  118/64          Oxygen Initial Assessment:   Oxygen Re-Evaluation:   Oxygen Discharge (Final Oxygen Re-Evaluation):   Initial Exercise Prescription: Initial Exercise Prescription - 04/30/18 1100    Date of Initial Exercise RX and Referring Provider          Date  04/30/18    Referring Provider  Daneen Schick MD        Recumbant Bike          Level  1.5    Minutes  10    METs  1.5        NuStep          Level  2    SPM  60    Minutes  10    METs  1.8        Track          Laps  7    Minutes  10    METs  2.23        Prescription Details          Frequency (times per week)  3    Duration  Progress to 30 minutes of continuous aerobic without signs/symptoms of physical distress        Intensity          THRR 40-80% of Max Heartrate  57-114    Ratings of Perceived Exertion  11-13    Perceived Dyspnea  0-4        Progression          Progression  Continue to progress workloads to maintain intensity without signs/symptoms of physical distress.        Resistance Training          Training Prescription  Yes  Weight  2lbs    Reps  10-15           Perform Capillary Blood Glucose checks as needed.  Exercise Prescription Changes: Exercise Prescription Changes    Response to Exercise    Row Name 05/04/18 1446 05/05/18 1400 05/11/18 1632 05/11/18 1634 05/25/18 1455   Blood Pressure (Admit)  122/60  no documentation  116/70  116/70  122/64   Blood Pressure (Exercise)  140/80  no  documentation  120/70  120/70  124/70   Blood Pressure (Exit)  122/70  no documentation  104/68  104/68  104/70   Heart Rate (Admit)  54 bpm  no documentation  57 bpm  57 bpm  65 bpm   Heart Rate (Exercise)  70 bpm  no documentation  88 bpm  88 bpm  75 bpm   Heart Rate (Exit)  49 bpm  no documentation  50 bpm  50 bpm  55 bpm   Rating of Perceived Exertion (Exercise)  13  no documentation  12  12  12    Symptoms  none  no documentation  none  nonr  none   Comments  pt was oriented to exercise equipment  no documentation  no documentation  no documentation  no documentation   Duration  Continue with 30 min of aerobic exercise without signs/symptoms of physical distress.  no documentation  Continue with 30 min of aerobic exercise without signs/symptoms of physical distress.  Continue with 30 min of aerobic exercise without signs/symptoms of physical distress.  Continue with 30 min of aerobic exercise without signs/symptoms of physical distress.   Intensity  THRR unchanged  no documentation  THRR unchanged  THRR unchanged  THRR unchanged       Progression    Row Name 05/04/18 1446 05/05/18 1400 05/11/18 1632 05/11/18 1634 05/25/18 1455   Progression  Continue to progress workloads to maintain intensity without signs/symptoms of physical distress.  no documentation  Continue to progress workloads to maintain intensity without signs/symptoms of physical distress.  Continue to progress workloads to maintain intensity without signs/symptoms of physical distress.  Continue to progress workloads to maintain intensity without signs/symptoms of physical distress.   Average METs  2.4  no documentation  2.5  2.8  2.8       Resistance Training    Row Name 05/04/18 1446 05/05/18 1400 05/11/18 1632 05/11/18 1634 05/25/18 1455   Training Prescription  Yes  no documentation  Yes  Yes  Yes   Weight  2lbs  no documentation  2lbs  2lbs  2lbs   Reps  10-15  no documentation  10-15  10-15  10-15   Time  10 Minutes  no  documentation  10 Minutes  10 Minutes  10 [place] Name 05/04/18 1446 05/05/18 1400 05/11/18 1632 05/11/18 1634 05/25/18 1455   Level  1.5  no documentation  1.5  1.5  3   Minutes  10  no documentation  10  10  10    METs  2.5  no documentation  3.11  3.1  2.1       NuStep    Row Name 05/04/18 1446 05/05/18 1400 05/11/18 1632 05/11/18 1634 05/25/18 1455   Level  2  no documentation  2  3  3    SPM  60  no documentation  60  70  70   Minutes  10  no documentation  10  10  10   METs  1.8  no documentation  2.1  2.1  2.5       Track    Row Name 05/04/18 1446 05/05/18 1400 05/11/18 1632 05/11/18 1634 05/25/18 1455   Laps  11  no documentation  13  13  16    Minutes  10  no documentation  10  10  10    METs  2.92  no documentation  2.92  3.26  3.79       [place] Name 05/04/18 1446 05/05/18 1400 05/11/18 1632 05/11/18 1634 05/25/18 1455   Plans to continue exercise at  no documentation  no documentation  no documentation  Home (comment) walking  Home (comment) walking   Frequency  no documentation  no documentation  no documentation  Add 2 additional days to program exercise sessions.  Add 2 additional days to program exercise sessions.   Initial Home Exercises Provided  no documentation  no documentation  no documentation  05/11/18  05/11/18          Exercise Comments: Exercise Comments    Row Name 05/04/18 1443 06/03/18 1232   Exercise Comments  Pt was oriented to exercise equipment. Pt was able to exercise safely for 30 minutes without any signs/symptoms of physical distress.  Reviewed METs and goals. Pt is tolerating exercise program fairly well. Pt will continue to exercise for 30 minutes without signs and symtpoms of physical distress.       Exercise Goals and Review: Exercise Goals    Exercise Goals    Row Name 04/30/18 0816   Increase Physical Activity  Yes   Intervention  Provide advice, education, support and counseling about  physical activity/exercise needs.;Develop an individualized exercise prescription for aerobic and resistive training based on initial evaluation findings, risk stratification, comorbidities and participant's personal goals.   Expected Outcomes  Short Term: Attend rehab on a regular basis to increase amount of physical activity.;Long Term: Exercising regularly at least 3-5 days a week.;Long Term: Add in home exercise to make exercise part of routine and to increase amount of physical activity.   Increase Strength and Stamina  Yes   Intervention  Provide advice, education, support and counseling about physical activity/exercise needs.;Develop an individualized exercise prescription for aerobic and resistive training based on initial evaluation findings, risk stratification, comorbidities and participant's personal goals.   Expected Outcomes  Short Term: Increase workloads from initial exercise prescription for resistance, speed, and METs.;Long Term: Improve cardiorespiratory fitness, muscular endurance and strength as measured by increased METs and functional capacity (6MWT);Short Term: Perform resistance training exercises routinely during rehab and add in resistance training at home   Able to understand and use rate of perceived exertion (RPE) scale  Yes   Intervention  Provide education and explanation on how to use RPE scale   Expected Outcomes  Short Term: Able to use RPE daily in rehab to express subjective intensity level;Long Term:  Able to use RPE to guide intensity level when exercising independently   Knowledge and understanding of Target Heart Rate Range (THRR)  Yes   Intervention  Provide education and explanation of THRR including how the numbers were predicted and where they are located for reference   Expected Outcomes  Short Term: Able to state/look up THRR;Long Term: Able to use THRR to govern intensity when exercising independently;Short Term: Able to use daily as guideline for intensity  in rehab   Able to check pulse independently  Yes  Intervention  Provide education and demonstration on how to check pulse in carotid and radial arteries.;Review the importance of being able to check your own pulse for safety during independent exercise   Expected Outcomes  Short Term: Able to explain why pulse checking is important during independent exercise;Long Term: Able to check pulse independently and accurately   Understanding of Exercise Prescription  Yes   Intervention  Provide education, explanation, and written materials on patient's individual exercise prescription   Expected Outcomes  Long Term: Able to explain home exercise prescription to exercise independently          Exercise Goals Re-Evaluation : Exercise Goals Re-Evaluation    Exercise Goal Re-Evaluation    Row Name 05/05/18 1444 05/11/18 1512 06/03/18 1230   Exercise Goals Review  Increase Physical Activity;Able to understand and use rate of perceived exertion (RPE) scale  Increase Physical Activity;Knowledge and understanding of Target Heart Rate Range (THRR);Able to understand and use rate of perceived exertion (RPE) scale;Understanding of Exercise Prescription;Increase Strength and Stamina;Able to check pulse independently  Increase Physical Activity;Knowledge and understanding of Target Heart Rate Range (THRR);Able to understand and use rate of perceived exertion (RPE) scale;Understanding of Exercise Prescription;Increase Strength and Stamina;Able to check pulse independently   Comments  Pt demonstrated proper use of RPE scale and was able to exercise for 30 minutes without difficulty.   Reviewed home exercise with pt today.  Pt plans to walk or do silver sneakers for exercise, 2x/week in addition to coming to cardiac rehab.  Reviewed THR, pulse, RPE, sign and symptoms, NTG use, and when to call 911 or MD.  Also discussed weather considerations and indoor options.  Pt voiced understanding.  Pt is active and walking at home  5x/week in addition to coming to cardiac rehab. Pt is able to walk a steady pace and gait for ~ one hour without difficulty.    Expected Outcomes  Pt will improve in cardiorespiratory fitness and musculoskeletal endurance.   Pt will improve in cardiorespiratory fitness and musculoskeletal endurance.   Pt will improve in cardiorespiratory fitness and musculoskeletal endurance.            Discharge Exercise Prescription (Final Exercise Prescription Changes): Exercise Prescription Changes - 05/25/18 1455    Response to Exercise          Blood Pressure (Admit)  122/64    Blood Pressure (Exercise)  124/70    Blood Pressure (Exit)  104/70    Heart Rate (Admit)  65 bpm    Heart Rate (Exercise)  75 bpm    Heart Rate (Exit)  55 bpm    Rating of Perceived Exertion (Exercise)  12    Symptoms  none    Duration  Continue with 30 min of aerobic exercise without signs/symptoms of physical distress.    Intensity  THRR unchanged        Progression          Progression  Continue to progress workloads to maintain intensity without signs/symptoms of physical distress.    Average METs  2.8        Resistance Training          Training Prescription  Yes    Weight  2lbs    Reps  10-15    Time  10 Minutes        Recumbant Bike          Level  3    Minutes  10    METs  2.1  NuStep          Level  3    SPM  70    Minutes  10    METs  2.5        Track          Laps  16    Minutes  10    METs  3.79        Home Exercise Plan          Plans to continue exercise at  Home (comment) walking    Frequency  Add 2 additional days to program exercise sessions.    Initial Home Exercises Provided  05/11/18           Nutrition:  Target Goals: Understanding of nutrition guidelines, daily intake of sodium 1500mg , cholesterol 200mg , calories 30% from fat and 7% or less from saturated fats, daily to have 5 or more servings of fruits and vegetables.  Biometrics: Pre Biometrics -  04/30/18 1145    Pre Biometrics          Height  5\' 10"  (1.778 m)    Weight  211 lb 13.8 oz (96.1 kg)    Waist Circumference  42.75 inches    Hip Circumference  44 inches    Waist to Hip Ratio  0.97 %    BMI (Calculated)  30.4    Triceps Skinfold  17 mm    % Body Fat  28.9 %    Grip Strength  32 kg    Flexibility  7.5 in    Single Leg Stand  13 seconds            Nutrition Therapy Plan and Nutrition Goals: Nutrition Therapy & Goals - 04/30/18 1157    Nutrition Therapy          Diet  Heart Healthy        Personal Nutrition Goals          Nutrition Goal  Pt to identify and limit food sources of trans fat and sodium        Intervention Plan          Intervention  Prescribe, educate and counsel regarding individualized specific dietary modifications aiming towards targeted core components such as weight, hypertension, lipid management, diabetes, heart failure and other comorbidities.    Expected Outcomes  Short Term Goal: Understand basic principles of dietary content, such as calories, fat, sodium, cholesterol and nutrients.;Long Term Goal: Adherence to prescribed nutrition plan.           Nutrition Assessments: Nutrition Assessments - 04/30/18 1157    MEDFICTS Scores          Pre Score  41           Nutrition Goals Re-Evaluation:   Nutrition Goals Re-Evaluation:   Nutrition Goals Discharge (Final Nutrition Goals Re-Evaluation):   Psychosocial: Target Goals: Acknowledge presence or absence of significant depression and/or stress, maximize coping skills, provide positive support system. Participant is able to verbalize types and ability to use techniques and skills needed for reducing stress and depression.  Initial Review & Psychosocial Screening: Initial Psych Review & Screening - 04/30/18 1223    Initial Review          Current issues with  None Identified        Family Dynamics          Good Support System?  Yes Bill's wife is a source of  support for him.  His wife was present at  today's session.        Barriers          Psychosocial barriers to participate in program  There are no identifiable barriers or psychosocial needs.        Screening Interventions          Interventions  Encouraged to exercise           Quality of Life Scores: Quality of Life - 04/30/18 1152    Quality of Life Scores          Health/Function Pre  20.27 %    Socioeconomic Pre  27.21 %    Psych/Spiritual Pre  24.86 %    Family Pre  27 %    GLOBAL Pre  23.53 %          Scores of 19 and below usually indicate a poorer quality of life in these areas.  A difference of  2-3 points is a clinically meaningful difference.  A difference of 2-3 points in the total score of the Quality of Life Index has been associated with significant improvement in overall quality of life, self-image, physical symptoms, and general health in studies assessing change in quality of life.  PHQ-9: Recent Review Flowsheet Data    Depression screen [facility] 2/9 05/04/2018   Decreased Interest 0   Down, Depressed, Hopeless 0   PHQ - 2 Score 0     Interpretation of Total Score  Total Score Depression Severity:  1-4 = Minimal depression, 5-9 = Mild depression, 10-14 = Moderate depression, 15-19 = Moderately severe depression, 20-27 = Severe depression   Psychosocial Evaluation and Intervention: Psychosocial Evaluation - 05/04/18 1032    Psychosocial Evaluation & Interventions          Interventions  Encouraged to exercise with the program and follow exercise prescription    Comments  no psychosocial needs identified, no interventions necessary. pt enjoys hiking in his neighborhood.     Expected Outcomes  pt will exhibit positive outlook with good coping skills.     Continue Psychosocial Services   No Follow up required           Psychosocial Re-Evaluation: Psychosocial Re-Evaluation    Psychosocial Re-Evaluation    Row Name 05/07/18 1100 05/26/18 0749    Current issues with  None Identified  None Identified   Comments  no psychosocial needs identified, no interventions necessary  no psychosocial needs identified, no interventions necessary   Expected Outcomes  pt will exhibit positive outlook with good coping skills.   pt will exhibit positive outlook with good coping skills.    Interventions  Encouraged to attend Cardiac Rehabilitation for the exercise  Encouraged to attend Cardiac Rehabilitation for the exercise   Continue Psychosocial Services   No Follow up required  No Follow up required          Psychosocial Discharge (Final Psychosocial Re-Evaluation): Psychosocial Re-Evaluation - 05/26/18 0749    Psychosocial Re-Evaluation          Current issues with  None Identified    Comments  no psychosocial needs identified, no interventions necessary    Expected Outcomes  pt will exhibit positive outlook with good coping skills.     Interventions  Encouraged to attend Cardiac Rehabilitation for the exercise    Continue Psychosocial Services   No Follow up required           Vocational Rehabilitation: Provide vocational rehab assistance to qualifying candidates.   Vocational Rehab Evaluation & Intervention:  Vocational Rehab - 04/30/18 1222    Initial Vocational Rehab Evaluation & Intervention          Assessment shows need for Vocational Rehabilitation  No           Education: Education Goals: Education classes will be provided on a weekly basis, covering required topics. Participant will state understanding/return demonstration of topics presented.  Learning Barriers/Preferences: Learning Barriers/Preferences - 04/30/18 2248    Learning Barriers/Preferences          Learning Barriers  Sight    Learning Preferences  Skilled Demonstration;Verbal Instruction;Written Material;Video           Education Topics: Count Your Pulse:  -Group instruction provided by verbal instruction, demonstration, patient participation and  written materials to support subject.  Instructors address importance of being able to find your pulse and how to count your pulse when at home without a heart monitor.  Patients get hands on experience counting their pulse with staff help and individually. Flowsheet Row CARDIAC REHAB PHASE II EXERCISE from 06/03/2018 in [place]  Date  05/22/18  Instruction Review Code  2- Demonstrated Understanding      Heart Attack, Angina, and Risk Factor Modification:  -Group instruction provided by verbal instruction, video, and written materials to support subject.  Instructors address signs and symptoms of angina and heart attacks.    Also discuss risk factors for heart disease and how to make changes to improve heart health risk factors.   Functional Fitness:  -Group instruction provided by verbal instruction, demonstration, patient participation, and written materials to support subject.  Instructors address safety measures for doing things around the house.  Discuss how to get up and down off the floor, how to pick things up properly, how to safely get out of a chair without assistance, and balance training.   Meditation and Mindfulness:  -Group instruction provided by verbal instruction, patient participation, and written materials to support subject.  Instructor addresses importance of mindfulness and meditation practice to help reduce stress and improve awareness.  Instructor also leads participants through a meditation exercise.    Stretching for Flexibility and Mobility:  -Group instruction provided by verbal instruction, patient participation, and written materials to support subject.  Instructors lead participants through series of stretches that are designed to increase flexibility thus improving mobility.  These stretches are additional exercise for major muscle groups that are typically performed during regular warm up and cool down.   Hands Only CPR:   -Group verbal, video, and participation provides a basic overview of AHA guidelines for community CPR. Role-play of emergencies allow participants the opportunity to practice calling for help and chest compression technique with discussion of AED use.   Hypertension: -Group verbal and written instruction that provides a basic overview of hypertension including the most recent diagnostic guidelines, risk factor reduction with self-care instructions and medication management. Flowsheet Row CARDIAC REHAB PHASE II EXERCISE from 06/03/2018 in [place]  Date  05/08/18  Educator  RN  Instruction Review Code  2- Demonstrated Understanding       Nutrition I class: Heart Healthy Eating:  -Group instruction provided by PowerPoint slides, verbal discussion, and written materials to support subject matter. The instructor gives an explanation and review of the Therapeutic Lifestyle Changes diet recommendations, which includes a discussion on lipid goals, dietary fat, sodium, fiber, plant stanol/sterol esters, sugar, and the components of a well-balanced, healthy diet. Flowsheet Row CARDIAC REHAB PHASE II EXERCISE  from 06/03/2018 in [place]  Date  04/30/18  Educator  RD      Nutrition II class: Lifestyle Skills:  -Group instruction provided by PowerPoint slides, verbal discussion, and written materials to support subject matter. The instructor gives an explanation and review of label reading, grocery shopping for heart health, heart healthy recipe modifications, and ways to make healthier choices when eating out.   Diabetes Question & Answer:  -Group instruction provided by PowerPoint slides, verbal discussion, and written materials to support subject matter. The instructor gives an explanation and review of diabetes co-morbidities, pre- and post-prandial blood glucose goals, pre-exercise blood glucose goals, signs, symptoms, and  treatment of hypoglycemia and hyperglycemia, and foot care basics.   Diabetes Blitz:  -Group instruction provided by PowerPoint slides, verbal discussion, and written materials to support subject matter. The instructor gives an explanation and review of the physiology behind type 1 and type 2 diabetes, diabetes medications and rational behind using different medications, pre- and post-prandial blood glucose recommendations and Hemoglobin A1c goals, diabetes diet, and exercise including blood glucose guidelines for exercising safely.    Portion Distortion:  -Group instruction provided by PowerPoint slides, verbal discussion, written materials, and food models to support subject matter. The instructor gives an explanation of serving size versus portion size, changes in portions sizes over the last 20 years, and what consists of a serving from each food group.   Stress Management:  -Group instruction provided by verbal instruction, video, and written materials to support subject matter.  Instructors review role of stress in heart disease and how to cope with stress positively.     Exercising on Your Own:  -Group instruction provided by verbal instruction, power point, and written materials to support subject.  Instructors discuss benefits of exercise, components of exercise, frequency and intensity of exercise, and end points for exercise.  Also discuss use of nitroglycerin and activating EMS.  Review options of places to exercise outside of rehab.  Review guidelines for sex with heart disease.   Cardiac Drugs I:  -Group instruction provided by verbal instruction and written materials to support subject.  Instructor reviews cardiac drug classes: antiplatelets, anticoagulants, beta blockers, and statins.  Instructor discusses reasons, side effects, and lifestyle considerations for each drug class. Flowsheet Row CARDIAC REHAB PHASE II EXERCISE from 06/03/2018 in [place]  Date  06/03/18  Instruction Review Code  2- Demonstrated Understanding      Cardiac Drugs II:  -Group instruction provided by verbal instruction and written materials to support subject.  Instructor reviews cardiac drug classes: angiotensin converting enzyme inhibitors (ACE-I), angiotensin II receptor blockers (ARBs), nitrates, and calcium channel blockers.  Instructor discusses reasons, side effects, and lifestyle considerations for each drug class. Flowsheet Row CARDIAC REHAB PHASE II EXERCISE from 06/03/2018 in [place]  Date  05/06/18  Instruction Review Code  2- Demonstrated Understanding      Anatomy and Physiology of the Circulatory System:  Group verbal and written instruction and models provide basic cardiac anatomy and physiology, with the coronary electrical and arterial systems. Review of: AMI, Angina, Valve disease, Heart Failure, Peripheral Artery Disease, Cardiac Arrhythmia, Pacemakers, and the ICD.   Other Education:  -Group or individual verbal, written, or video instructions that support the educational goals of the cardiac rehab program.   Holiday Eating Survival Tips:  -Group instruction provided by PowerPoint slides, verbal discussion, and written materials to support subject matter. The  instructor gives patients tips, tricks, and techniques to help them not only survive but enjoy the holidays despite the onslaught of food that accompanies the holidays.   Knowledge Questionnaire Score: Knowledge Questionnaire Score - 04/30/18 1129    Knowledge Questionnaire Score          Pre Score  18/24           Core Components/Risk Factors/Patient Goals at Admission: Personal Goals and Risk Factors at Admission - 04/30/18 1146    Core Components/Risk Factors/Patient Goals on Admission           Weight Management  Yes;Obesity    Intervention  Weight Management: Develop a combined nutrition and exercise program designed to reach  desired caloric intake, while maintaining appropriate intake of nutrient and fiber, sodium and fats, and appropriate energy expenditure required for the weight goal.;Weight Management: Provide education and appropriate resources to help participant work on and attain dietary goals.;Weight Management/Obesity: Establish reasonable short term and long term weight goals.    Admit Weight  211 lb 13.8 oz (96.1 kg)    Goal Weight: Short Term  200 lb (90.7 kg)    Goal Weight: Long Term  190 lb (86.2 kg)    Expected Outcomes  Short Term: Continue to assess and modify interventions until short term weight is achieved;Long Term: Adherence to nutrition and physical activity/exercise program aimed toward attainment of established weight goal;Weight Maintenance: Understanding of the daily nutrition guidelines, which includes 25-35% calories from fat, 7% or less cal from saturated fats, less than 200mg  cholesterol, less than 1.5gm of sodium, & 5 or more servings of fruits and vegetables daily;Weight Loss: Understanding of general recommendations for a balanced deficit meal plan, which promotes 1-2 lb weight loss per week and includes a negative energy balance of [phone] kcal/d;Understanding recommendations for meals to include 15-35% energy as protein, 25-35% energy from fat, 35-60% energy from carbohydrates, less than 200mg  of dietary cholesterol, 20-35 gm of total fiber daily;Understanding of distribution of calorie intake throughout the day with the consumption of 4-5 meals/snacks    Heart Failure  Yes    Intervention  Provide a combined exercise and nutrition program that is supplemented with education, support and counseling about heart failure. Directed toward relieving symptoms such as shortness of breath, decreased exercise tolerance, and extremity edema.    Expected Outcomes  Improve functional capacity of life;Short term: Attendance in program 2-3 days a week with increased exercise capacity. Reported lower  sodium intake. Reported increased fruit and vegetable intake. Reports medication compliance.;Short term: Daily weights obtained and reported for increase. Utilizing diuretic protocols set by physician.;Long term: Adoption of self-care skills and reduction of barriers for early signs and symptoms recognition and intervention leading to self-care maintenance.    Hypertension  Yes    Intervention  Provide education on lifestyle modifcations including regular physical activity/exercise, weight management, moderate sodium restriction and increased consumption of fresh fruit, vegetables, and low fat dairy, alcohol moderation, and smoking cessation.;Monitor prescription use compliance.    Expected Outcomes  Short Term: Continued assessment and intervention until BP is < 140/60mm HG in hypertensive participants. < 130/22mm HG in hypertensive participants with diabetes, heart failure or chronic kidney disease.;Long Term: Maintenance of blood pressure at goal levels.    Lipids  Yes    Intervention  Provide education and support for participant on nutrition & aerobic/resistive exercise along with prescribed medications to achieve LDL 70mg , HDL >40mg .    Expected Outcomes  Short Term: Participant states understanding of  desired cholesterol values and is compliant with medications prescribed. Participant is following exercise prescription and nutrition guidelines.;Long Term: Cholesterol controlled with medications as prescribed, with individualized exercise RX and with personalized nutrition plan. Value goals: LDL < 70mg , HDL > 40 mg.           Core Components/Risk Factors/Patient Goals Review:  Goals and Risk Factor Review    Core Components/Risk Factors/Patient Goals Review    Row Name 05/04/18 1026 05/26/18 0749   Personal Goals Review  Weight Management/Obesity;Heart Failure;Hypertension;Lipids  Weight Management/Obesity;Heart Failure;Hypertension;Lipids   Review  pt with multiple CAD RF demonstrates  willingness to participate in CR exercise program. pt personal goal is lose weight to 180lb. pt encouraged to participate in CR exercise,nutrition and risk factor education classes.   pt with multiple CAD RF demonstrates willingness to participate in CR exercise program. pt personal goal is lose weight to 180lb. pt pleased with improving endurance.    Expected Outcomes  pt will participate in CR Exercise, nutrition and lifestyle modification opportunities.   pt will participate in CR Exercise, nutrition and lifestyle modification opportunities.           Core Components/Risk Factors/Patient Goals at Discharge (Final Review):  Goals and Risk Factor Review - 05/26/18 0749    Core Components/Risk Factors/Patient Goals Review          Personal Goals Review  Weight Management/Obesity;Heart Failure;Hypertension;Lipids    Review  pt with multiple CAD RF demonstrates willingness to participate in CR exercise program. pt personal goal is lose weight to 180lb. pt pleased with improving endurance.     Expected Outcomes  pt will participate in CR Exercise, nutrition and lifestyle modification opportunities.            ITP Comments: ITP Comments    Row Name 04/30/18 0812 05/04/18 1024 05/07/18 1058 05/26/18 0748   ITP Comments  Dr. Fransico Him, Medical Director  pt started group exercise session today.  pt tolerated light activity without difficulty.  pt oriented to exercise equipment and safety routine.    30 day ITP review.  pt demonstrates willingness to participate in group exercise program.  30 day ITP review.  pt demonstrates willingness to participate in group exercise program.      Comments:

## 2018-06-05 ENCOUNTER — Encounter (HOSPITAL_COMMUNITY)
Admission: RE | Admit: 2018-06-05 | Discharge: 2018-06-05 | Disposition: A | Discharge status: Home / Self Care | Payer: Medicare Other | Source: Ambulatory Visit | Attending: Interventional Cardiology | Admitting: Interventional Cardiology

## 2018-06-05 DIAGNOSIS — Z951 Presence of aortocoronary bypass graft: Secondary | ICD-10-CM | POA: Diagnosis not present

## 2018-06-05 DIAGNOSIS — Z48812 Encounter for surgical aftercare following surgery on the circulatory system: Secondary | ICD-10-CM | POA: Diagnosis not present

## 2018-06-08 ENCOUNTER — Encounter (HOSPITAL_COMMUNITY)
Admission: RE | Admit: 2018-06-08 | Discharge: 2018-06-08 | Disposition: A | Discharge status: Home / Self Care | Payer: Medicare Other | Source: Ambulatory Visit | Attending: Cardiology | Admitting: Cardiology

## 2018-06-08 DIAGNOSIS — Z48812 Encounter for surgical aftercare following surgery on the circulatory system: Secondary | ICD-10-CM | POA: Diagnosis not present

## 2018-06-08 DIAGNOSIS — Z951 Presence of aortocoronary bypass graft: Secondary | ICD-10-CM | POA: Diagnosis not present

## 2018-06-09 ENCOUNTER — Other Ambulatory Visit: Payer: Medicare Other

## 2018-06-09 DIAGNOSIS — E785 Hyperlipidemia, unspecified: Secondary | ICD-10-CM | POA: Diagnosis not present

## 2018-06-09 LAB — LIPID PANEL
Chol/HDL Ratio: 4.2 ratio (ref 0.0–5.0)
Cholesterol, Total: 157 mg/dL (ref 100–199)
HDL: 37 mg/dL — ABNORMAL LOW (ref >39)
LDL Calculated: 81 mg/dL (ref 0–99)
Triglycerides: 195 mg/dL — ABNORMAL HIGH (ref 0–149)
VLDL Cholesterol Cal: 39 mg/dL (ref 5–40)

## 2018-06-09 LAB — HEPATIC FUNCTION PANEL
ALT: 17 IU/L (ref 0–44)
AST: 24 IU/L (ref 0–40)
Albumin: 4.3 g/dL (ref 3.5–4.8)
Alkaline Phosphatase: 58 IU/L (ref 39–117)
Bilirubin Total: 0.5 mg/dL (ref 0.0–1.2)
Bilirubin, Direct: 0.13 mg/dL (ref 0.00–0.40)
Total Protein: 6.5 g/dL (ref 6.0–8.5)

## 2018-06-10 ENCOUNTER — Encounter (HOSPITAL_COMMUNITY)
Admission: RE | Admit: 2018-06-10 | Discharge: 2018-06-10 | Disposition: A | Discharge status: Home / Self Care | Payer: Medicare Other | Source: Ambulatory Visit | Attending: Interventional Cardiology | Admitting: Interventional Cardiology

## 2018-06-10 DIAGNOSIS — Z951 Presence of aortocoronary bypass graft: Secondary | ICD-10-CM | POA: Diagnosis not present

## 2018-06-10 DIAGNOSIS — Z48812 Encounter for surgical aftercare following surgery on the circulatory system: Secondary | ICD-10-CM | POA: Diagnosis not present

## 2018-06-12 ENCOUNTER — Encounter (HOSPITAL_COMMUNITY)
Admission: RE | Admit: 2018-06-12 | Discharge: 2018-06-12 | Disposition: A | Discharge status: Home / Self Care | Payer: Medicare Other | Source: Ambulatory Visit | Attending: Interventional Cardiology | Admitting: Interventional Cardiology

## 2018-06-12 DIAGNOSIS — Z48812 Encounter for surgical aftercare following surgery on the circulatory system: Secondary | ICD-10-CM | POA: Diagnosis not present

## 2018-06-12 DIAGNOSIS — Z951 Presence of aortocoronary bypass graft: Secondary | ICD-10-CM | POA: Diagnosis not present

## 2018-06-15 ENCOUNTER — Encounter (HOSPITAL_COMMUNITY)
Admission: RE | Admit: 2018-06-15 | Discharge: 2018-06-15 | Disposition: A | Discharge status: Home / Self Care | Payer: Medicare Other | Source: Ambulatory Visit | Attending: Interventional Cardiology | Admitting: Interventional Cardiology

## 2018-06-15 DIAGNOSIS — Z48812 Encounter for surgical aftercare following surgery on the circulatory system: Secondary | ICD-10-CM | POA: Diagnosis not present

## 2018-06-15 DIAGNOSIS — Z951 Presence of aortocoronary bypass graft: Secondary | ICD-10-CM | POA: Diagnosis not present

## 2018-06-17 ENCOUNTER — Encounter (HOSPITAL_COMMUNITY)
Admission: RE | Admit: 2018-06-17 | Discharge: 2018-06-17 | Disposition: A | Discharge status: Home / Self Care | Payer: Medicare Other | Source: Ambulatory Visit | Attending: Interventional Cardiology | Admitting: Interventional Cardiology

## 2018-06-17 DIAGNOSIS — Z48812 Encounter for surgical aftercare following surgery on the circulatory system: Secondary | ICD-10-CM | POA: Diagnosis not present

## 2018-06-17 DIAGNOSIS — Z951 Presence of aortocoronary bypass graft: Secondary | ICD-10-CM | POA: Diagnosis not present

## 2018-06-19 ENCOUNTER — Encounter (HOSPITAL_COMMUNITY)
Admission: RE | Admit: 2018-06-19 | Discharge: 2018-06-19 | Disposition: A | Discharge status: Home / Self Care | Payer: Medicare Other | Source: Ambulatory Visit | Attending: Interventional Cardiology | Admitting: Interventional Cardiology

## 2018-06-19 DIAGNOSIS — Z48812 Encounter for surgical aftercare following surgery on the circulatory system: Secondary | ICD-10-CM | POA: Diagnosis not present

## 2018-06-19 DIAGNOSIS — Z951 Presence of aortocoronary bypass graft: Secondary | ICD-10-CM | POA: Diagnosis not present

## 2018-06-22 ENCOUNTER — Encounter (HOSPITAL_COMMUNITY)
Admission: RE | Admit: 2018-06-22 | Discharge: 2018-06-22 | Disposition: A | Discharge status: Home / Self Care | Payer: Medicare Other | Source: Ambulatory Visit | Attending: Interventional Cardiology | Admitting: Interventional Cardiology

## 2018-06-22 DIAGNOSIS — Z951 Presence of aortocoronary bypass graft: Secondary | ICD-10-CM | POA: Diagnosis not present

## 2018-06-22 DIAGNOSIS — E538 Deficiency of other specified B group vitamins: Secondary | ICD-10-CM | POA: Diagnosis not present

## 2018-06-22 DIAGNOSIS — Z48812 Encounter for surgical aftercare following surgery on the circulatory system: Secondary | ICD-10-CM | POA: Diagnosis not present

## 2018-06-24 ENCOUNTER — Encounter (HOSPITAL_COMMUNITY)
Admission: RE | Admit: 2018-06-24 | Discharge: 2018-06-24 | Disposition: A | Discharge status: Home / Self Care | Payer: Medicare Other | Source: Ambulatory Visit | Attending: Interventional Cardiology | Admitting: Interventional Cardiology

## 2018-06-24 DIAGNOSIS — Z951 Presence of aortocoronary bypass graft: Secondary | ICD-10-CM

## 2018-06-24 DIAGNOSIS — Z48812 Encounter for surgical aftercare following surgery on the circulatory system: Secondary | ICD-10-CM | POA: Diagnosis not present

## 2018-06-26 ENCOUNTER — Encounter (HOSPITAL_COMMUNITY)
Admission: RE | Admit: 2018-06-26 | Discharge: 2018-06-26 | Disposition: A | Discharge status: Home / Self Care | Payer: Medicare Other | Source: Ambulatory Visit | Attending: Interventional Cardiology | Admitting: Interventional Cardiology

## 2018-06-26 DIAGNOSIS — Z48812 Encounter for surgical aftercare following surgery on the circulatory system: Secondary | ICD-10-CM | POA: Insufficient documentation

## 2018-06-26 DIAGNOSIS — Z951 Presence of aortocoronary bypass graft: Secondary | ICD-10-CM | POA: Diagnosis not present

## 2018-06-29 ENCOUNTER — Encounter (HOSPITAL_COMMUNITY): Payer: Medicare Other

## 2018-07-01 ENCOUNTER — Encounter (HOSPITAL_COMMUNITY)
Admission: RE | Admit: 2018-07-01 | Discharge: 2018-07-01 | Disposition: A | Discharge status: Home / Self Care | Payer: Medicare Other | Source: Ambulatory Visit | Attending: Interventional Cardiology | Admitting: Interventional Cardiology

## 2018-07-01 DIAGNOSIS — Z951 Presence of aortocoronary bypass graft: Secondary | ICD-10-CM | POA: Diagnosis not present

## 2018-07-01 DIAGNOSIS — Z48812 Encounter for surgical aftercare following surgery on the circulatory system: Secondary | ICD-10-CM | POA: Diagnosis not present

## 2018-07-01 NOTE — Progress Notes (Signed)
Cardiac Individual Treatment Plan  Patient Details  Name: Calvin Ramirez MRN: [id number] Date of Birth: 05-12-1941 Referring Provider:   Flowsheet Row CARDIAC REHAB PHASE II ORIENTATION from 04/30/2018 in [facility]  Referring Provider  Daneen Schick MD      Initial Encounter Date:  [place] from 04/30/2018 in [facility]  Date  04/30/18      Visit Diagnosis: S/P CABG x 2  Patient's Home Medications on Admission:  Current Outpatient Medications:  .  aspirin EC 325 MG tablet, Take 325 mg by mouth daily., Disp: , Rfl:  .  furosemide (LASIX) 40 MG tablet, Take 1 tablet (40 mg total) by mouth daily., Disp: 90 tablet, Rfl: 3 .  losartan (COZAAR) 50 MG tablet, Take 1 tablet (50 mg total) by mouth daily., Disp: 90 tablet, Rfl: 3 .  Melatonin 5 MG CAPS, Take 1 capsule by mouth as needed (sleep)., Disp: , Rfl:  .  metoprolol tartrate (LOPRESSOR) 25 MG tablet, Take 0.5 tablets (12.5 mg total) by mouth 2 (two) times daily., Disp: 90 tablet, Rfl: 3 .  rosuvastatin (CRESTOR) 20 MG tablet, Take 1 tablet (20 mg total) by mouth daily., Disp: 90 tablet, Rfl: 3  Past Medical History: Past Medical History:  Diagnosis Date  . Acute diastolic (congestive) heart failure ([place]) 02/13/2018  . Arthritis   . 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 CAD (coronary artery disease) 02/24/2018  . CAD in native artery 04/26/2015   2011 Multivessel DES PCI LAD,RCA PLOM and distal RCA   . Coronary artery disease   . Dementia   . Essential hypertension 04/21/2014  . Hyperlipidemia   . Parkinson's disease ([place]) 04/27/2015   2016   . Paroxysmal atrial fibrillation ([place]) 03/17/2018  . 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 Stented coronary artery 2011   x3    Tobacco Use: Social History   Tobacco Use  Smoking Status Never Smoker  Smokeless Tobacco Never Used     Labs: Recent Review Flowsheet Data    Labs for ITP Cardiac and Pulmonary Rehab Latest Ref Rng & Units 02/25/2018 02/25/2018 02/26/2018 03/10/2018 06/09/2018   Cholestrol 100 - 199 mg/dL - - - - 157   LDLCALC 0 - 99 mg/dL - - - - 81   LDLDIRECT mg/dL - - - - -   HDL >39 mg/dL - - - - 37(L)   Trlycerides 0 - 149 mg/dL - - - - 195(H)   Hemoglobin A1c 4.8 - 5.6 % - - - - -   PHART 7.350 - 7.450 7.370 - 7.379 - -   PCO2ART 32.0 - 48.0 mmHg 45.4 - 41.4 - -   HCO3 20.0 - 28.0 mmol/L 26.2 - 23.6 - -   TCO2 22 - 32 mmol/L 28 23 24 23  -   ACIDBASEDEF 0.0 - 2.0 mmol/L - - 0.7 - -   O2SAT % 98.0 - 92.3 - -      Capillary Blood Glucose: Lab Results  Component Value Date   GLUCAP 146 (H) 02/27/2018   GLUCAP 129 (H) 02/27/2018   GLUCAP 141 (H) 02/27/2018   GLUCAP 120 (H) 02/26/2018   GLUCAP 158 (H) 02/26/2018     Exercise Target Goals:    Exercise Program Goal: Individual exercise prescription set using results from initial 6 min walk test and THRR while considering  patient's activity barriers and safety.   Exercise Prescription Goal: Initial exercise prescription builds to 30-45 minutes a day of aerobic activity, 2-3 days per week.  Home exercise guidelines will be given to patient during program as part of exercise prescription that the participant will acknowledge.  Activity Barriers & Risk Stratification: Activity Barriers & Cardiac Risk Stratification - 04/30/18 0814    Activity Barriers & Cardiac Risk Stratification          Activity Barriers  Muscular Weakness;Deconditioning;Incisional Pain;Shortness of Breath    Cardiac Risk Stratification  High           6 Minute Walk: 6 Minute Walk    6 Minute Walk    Row Name 04/30/18 1130 04/30/18 1139   Phase  Initial  no documentation   Distance  no documentation  1056 feet   Walk Time  no documentation  6 minutes   # of Rest Breaks  no documentation  0   MPH  no documentation  2   METS  no documentation  1.7   RPE  no  documentation  11   VO2 Peak  no documentation  5.8   Symptoms  no documentation  Yes (comment)   Comments  no documentation  abdominal discomfort   Resting HR  no documentation  63 bpm   Resting BP  no documentation  118/64   Resting Oxygen Saturation   no documentation  97 %   Exercise Oxygen Saturation  during 6 min walk  no documentation  99 %   Max Ex. HR  no documentation  74 bpm   Max Ex. BP  no documentation  124/80   2 Minute Post BP  no documentation  118/64          Oxygen Initial Assessment:   Oxygen Re-Evaluation:   Oxygen Discharge (Final Oxygen Re-Evaluation):   Initial Exercise Prescription: Initial Exercise Prescription - 04/30/18 1100    Date of Initial Exercise RX and Referring Provider          Date  04/30/18    Referring Provider  Daneen Schick MD        Recumbant Bike          Level  1.5    Minutes  10    METs  1.5        NuStep          Level  2    SPM  60    Minutes  10    METs  1.8        Track          Laps  7    Minutes  10    METs  2.23        Prescription Details          Frequency (times per week)  3    Duration  Progress to 30 minutes of continuous aerobic without signs/symptoms of physical distress        Intensity          THRR 40-80% of Max Heartrate  57-114    Ratings of Perceived Exertion  11-13    Perceived Dyspnea  0-4        Progression          Progression  Continue to progress workloads to maintain intensity without signs/symptoms of physical distress.        Resistance Training          Training Prescription  Yes  Weight  2lbs    Reps  10-15           Perform Capillary Blood Glucose checks as needed.  Exercise Prescription Changes: Exercise Prescription Changes    Response to Exercise    Row Name 05/04/18 1446 05/05/18 1400 05/11/18 1632 05/11/18 1634 05/25/18 1455   Blood Pressure (Admit)  122/60  no documentation  116/70  116/70  122/64   Blood Pressure (Exercise)  140/80  no  documentation  120/70  120/70  124/70   Blood Pressure (Exit)  122/70  no documentation  104/68  104/68  104/70   Heart Rate (Admit)  54 bpm  no documentation  57 bpm  57 bpm  65 bpm   Heart Rate (Exercise)  70 bpm  no documentation  88 bpm  88 bpm  75 bpm   Heart Rate (Exit)  49 bpm  no documentation  50 bpm  50 bpm  55 bpm   Rating of Perceived Exertion (Exercise)  13  no documentation  12  12  12    Symptoms  none  no documentation  none  nonr  none   Comments  pt was oriented to exercise equipment  no documentation  no documentation  no documentation  no documentation   Duration  Continue with 30 min of aerobic exercise without signs/symptoms of physical distress.  no documentation  Continue with 30 min of aerobic exercise without signs/symptoms of physical distress.  Continue with 30 min of aerobic exercise without signs/symptoms of physical distress.  Continue with 30 min of aerobic exercise without signs/symptoms of physical distress.   Intensity  THRR unchanged  no documentation  THRR unchanged  THRR unchanged  THRR unchanged       Progression    Row Name 05/04/18 1446 05/05/18 1400 05/11/18 1632 05/11/18 1634 05/25/18 1455   Progression  Continue to progress workloads to maintain intensity without signs/symptoms of physical distress.  no documentation  Continue to progress workloads to maintain intensity without signs/symptoms of physical distress.  Continue to progress workloads to maintain intensity without signs/symptoms of physical distress.  Continue to progress workloads to maintain intensity without signs/symptoms of physical distress.   Average METs  2.4  no documentation  2.5  2.8  2.8       Resistance Training    Row Name 05/04/18 1446 05/05/18 1400 05/11/18 1632 05/11/18 1634 05/25/18 1455   Training Prescription  Yes  no documentation  Yes  Yes  Yes   Weight  2lbs  no documentation  2lbs  2lbs  2lbs   Reps  10-15  no documentation  10-15  10-15  10-15   Time  10 Minutes  no  documentation  10 Minutes  10 Minutes  10 [place] Name 05/04/18 1446 05/05/18 1400 05/11/18 1632 05/11/18 1634 05/25/18 1455   Level  1.5  no documentation  1.5  1.5  3   Minutes  10  no documentation  10  10  10    METs  2.5  no documentation  3.11  3.1  2.1       NuStep    Row Name 05/04/18 1446 05/05/18 1400 05/11/18 1632 05/11/18 1634 05/25/18 1455   Level  2  no documentation  2  3  3    SPM  60  no documentation  60  70  70   Minutes  10  no documentation  10  10  10   METs  1.8  no documentation  2.1  2.1  2.5       Track    Row Name 05/04/18 1446 05/05/18 1400 05/11/18 1632 05/11/18 1634 05/25/18 1455   Laps  11  no documentation  13  13  16    Minutes  10  no documentation  10  10  10    METs  2.92  no documentation  2.92  3.26  3.79       [place] Name 05/04/18 1446 05/05/18 1400 05/11/18 1632 05/11/18 1634 05/25/18 1455   Plans to continue exercise at  no documentation  no documentation  no documentation  Home (comment) walking  Home (comment) walking   Frequency  no documentation  no documentation  no documentation  Add 2 additional days to program exercise sessions.  Add 2 additional days to program exercise sessions.   Initial Home Exercises Provided  no documentation  no documentation  no documentation  05/11/18  05/11/18       Response to Exercise    Row Name 06/08/18 1621 06/26/18 1200   Blood Pressure (Admit)  124/60  102/64   Blood Pressure (Exercise)  114/62  110/60   Blood Pressure (Exit)  102/58  104/62   Heart Rate (Admit)  64 bpm  58 bpm   Heart Rate (Exercise)  78 bpm  83 bpm   Heart Rate (Exit)  59 bpm  58 bpm   Rating of Perceived Exertion (Exercise)  13  13   Symptoms  none  none   Duration  Continue with 30 min of aerobic exercise without signs/symptoms of physical distress.  Continue with 30 min of aerobic exercise without signs/symptoms of physical distress.   Intensity  THRR unchanged  THRR unchanged        Progression    Row Name 06/08/18 1621 06/26/18 1200   Progression  Continue to progress workloads to maintain intensity without signs/symptoms of physical distress.  Continue to progress workloads to maintain intensity without signs/symptoms of physical distress.   Average METs  3.2  3.6       Resistance Training    Row Name 06/08/18 1621 06/26/18 1200   Training Prescription  Yes  Yes   Weight  3lbs  4lbs   Reps  10-15  10-15   Time  10 Minutes  [place] Name 06/08/18 1621 06/26/18 1200   Level  3.5  3.5   Minutes  10  10   METs  3.1  3       NuStep    Row Name 06/08/18 1621 06/26/18 1200   Level  4  5   SPM  80  90   Minutes  10  10   METs  3.1  4.5       Track    Row Name 06/08/18 1621 06/26/18 1200   Laps  13  14   Minutes  10  10   METs  3.26  3.43       Home Exercise Plan    [place] Name 06/08/18 1621 06/26/18 1200   Plans to continue exercise at  Home (comment) walking  Home (comment) walking   Frequency  Add 2 additional days to program exercise sessions.  Add 2 additional days to program exercise sessions.   Initial Home Exercises Provided  05/11/18  05/11/18  Exercise Comments: Exercise Comments    Row Name 05/04/18 1443 06/03/18 1232 06/17/18 1532   Exercise Comments  Pt was oriented to exercise equipment. Pt was able to exercise safely for 30 minutes without any signs/symptoms of physical distress.  Reviewed METs and goals. Pt is tolerating exercise program fairly well. Pt will continue to exercise for 30 minutes without signs and symtpoms of physical distress.   Reviewed METs and goals. Pt is tolerating exercise program fairly well. Pt will continue to exercise for 30 minutes without signs and symtpoms of physical distress.       Exercise Goals and Review: Exercise Goals    Exercise Goals    Row Name 04/30/18 0816   Increase Physical Activity  Yes   Intervention  Provide advice, education, support and  counseling about physical activity/exercise needs.;Develop an individualized exercise prescription for aerobic and resistive training based on initial evaluation findings, risk stratification, comorbidities and participant's personal goals.   Expected Outcomes  Short Term: Attend rehab on a regular basis to increase amount of physical activity.;Long Term: Exercising regularly at least 3-5 days a week.;Long Term: Add in home exercise to make exercise part of routine and to increase amount of physical activity.   Increase Strength and Stamina  Yes   Intervention  Provide advice, education, support and counseling about physical activity/exercise needs.;Develop an individualized exercise prescription for aerobic and resistive training based on initial evaluation findings, risk stratification, comorbidities and participant's personal goals.   Expected Outcomes  Short Term: Increase workloads from initial exercise prescription for resistance, speed, and METs.;Long Term: Improve cardiorespiratory fitness, muscular endurance and strength as measured by increased METs and functional capacity (6MWT);Short Term: Perform resistance training exercises routinely during rehab and add in resistance training at home   Able to understand and use rate of perceived exertion (RPE) scale  Yes   Intervention  Provide education and explanation on how to use RPE scale   Expected Outcomes  Short Term: Able to use RPE daily in rehab to express subjective intensity level;Long Term:  Able to use RPE to guide intensity level when exercising independently   Knowledge and understanding of Target Heart Rate Range (THRR)  Yes   Intervention  Provide education and explanation of THRR including how the numbers were predicted and where they are located for reference   Expected Outcomes  Short Term: Able to state/look up THRR;Long Term: Able to use THRR to govern intensity when exercising independently;Short Term: Able to use daily as  guideline for intensity in rehab   Able to check pulse independently  Yes   Intervention  Provide education and demonstration on how to check pulse in carotid and radial arteries.;Review the importance of being able to check your own pulse for safety during independent exercise   Expected Outcomes  Short Term: Able to explain why pulse checking is important during independent exercise;Long Term: Able to check pulse independently and accurately   Understanding of Exercise Prescription  Yes   Intervention  Provide education, explanation, and written materials on patient's individual exercise prescription   Expected Outcomes  Long Term: Able to explain home exercise prescription to exercise independently          Exercise Goals Re-Evaluation : Exercise Goals Re-Evaluation    Exercise Goal Re-Evaluation    Row Name 05/05/18 1444 05/11/18 1512 06/03/18 1230 06/17/18 1528   Exercise Goals Review  Increase Physical Activity;Able to understand and use rate of perceived exertion (RPE) scale  Increase Physical Activity;Knowledge and  understanding of Target Heart Rate Range (THRR);Able to understand and use rate of perceived exertion (RPE) scale;Understanding of Exercise Prescription;Increase Strength and Stamina;Able to check pulse independently  Increase Physical Activity;Knowledge and understanding of Target Heart Rate Range (THRR);Able to understand and use rate of perceived exertion (RPE) scale;Understanding of Exercise Prescription;Increase Strength and Stamina;Able to check pulse independently  Increase Physical Activity;Knowledge and understanding of Target Heart Rate Range (THRR);Able to understand and use rate of perceived exertion (RPE) scale;Understanding of Exercise Prescription;Increase Strength and Stamina;Able to check pulse independently   Comments  Pt demonstrated proper use of RPE scale and was able to exercise for 30 minutes without difficulty.   Reviewed home exercise with pt today.  Pt  plans to walk or do silver sneakers for exercise, 2x/week in addition to coming to cardiac rehab.  Reviewed THR, pulse, RPE, sign and symptoms, NTG use, and when to call 911 or MD.  Also discussed weather considerations and indoor options.  Pt voiced understanding.  Pt is active and walking at home 5x/week in addition to coming to cardiac rehab. Pt is able to walk a steady pace and gait for ~ one hour without difficulty.   Pt has been mowing the lawn,washing/waxing the car and walking approx. 1.5 miles 3x/week. Pt reported feeling great and having increased energy   Expected Outcomes  Pt will improve in cardiorespiratory fitness and musculoskeletal endurance.   Pt will improve in cardiorespiratory fitness and musculoskeletal endurance.   Pt will improve in cardiorespiratory fitness and musculoskeletal endurance.   Pt will improve in cardiorespiratory fitness and musculoskeletal endurance in order to perform household chores and functional activities.            Discharge Exercise Prescription (Final Exercise Prescription Changes): Exercise Prescription Changes - 06/26/18 1200    Response to Exercise          Blood Pressure (Admit)  102/64    Blood Pressure (Exercise)  110/60    Blood Pressure (Exit)  104/62    Heart Rate (Admit)  58 bpm    Heart Rate (Exercise)  83 bpm    Heart Rate (Exit)  58 bpm    Rating of Perceived Exertion (Exercise)  13    Symptoms  none    Duration  Continue with 30 min of aerobic exercise without signs/symptoms of physical distress.    Intensity  THRR unchanged        Progression          Progression  Continue to progress workloads to maintain intensity without signs/symptoms of physical distress.    Average METs  3.6        Resistance Training          Training Prescription  Yes    Weight  4lbs    Reps  10-15    Time  10 Minutes        Recumbant Bike          Level  3.5    Minutes  10    METs  3        NuStep          Level  5    SPM  90     Minutes  10    METs  4.5        Track          Laps  14    Minutes  10    METs  3.43        Home Exercise  Plan          Plans to continue exercise at  Home (comment) walking    Frequency  Add 2 additional days to program exercise sessions.    Initial Home Exercises Provided  05/11/18           Nutrition:  Target Goals: Understanding of nutrition guidelines, daily intake of sodium 1500mg , cholesterol 200mg , calories 30% from fat and 7% or less from saturated fats, daily to have 5 or more servings of fruits and vegetables.  Biometrics: Pre Biometrics - 04/30/18 1145    Pre Biometrics          Height  5\' 10"  (1.778 m)    Weight  211 lb 13.8 oz (96.1 kg)    Waist Circumference  42.75 inches    Hip Circumference  44 inches    Waist to Hip Ratio  0.97 %    BMI (Calculated)  30.4    Triceps Skinfold  17 mm    % Body Fat  28.9 %    Grip Strength  32 kg    Flexibility  7.5 in    Single Leg Stand  13 seconds            Nutrition Therapy Plan and Nutrition Goals: Nutrition Therapy & Goals - 04/30/18 1157    Nutrition Therapy          Diet  Heart Healthy        Personal Nutrition Goals          Nutrition Goal  Pt to identify and limit food sources of trans fat and sodium        Intervention Plan          Intervention  Prescribe, educate and counsel regarding individualized specific dietary modifications aiming towards targeted core components such as weight, hypertension, lipid management, diabetes, heart failure and other comorbidities.    Expected Outcomes  Short Term Goal: Understand basic principles of dietary content, such as calories, fat, sodium, cholesterol and nutrients.;Long Term Goal: Adherence to prescribed nutrition plan.           Nutrition Assessments: Nutrition Assessments - 04/30/18 1157    MEDFICTS Scores          Pre Score  41           Nutrition Goals Re-Evaluation:   Nutrition Goals Re-Evaluation:   Nutrition Goals Discharge  (Final Nutrition Goals Re-Evaluation):   Psychosocial: Target Goals: Acknowledge presence or absence of significant depression and/or stress, maximize coping skills, provide positive support system. Participant is able to verbalize types and ability to use techniques and skills needed for reducing stress and depression.  Initial Review & Psychosocial Screening: Initial Psych Review & Screening - 04/30/18 1223    Initial Review          Current issues with  None Identified        Family Dynamics          Good Support System?  Yes Bill's wife is a source of support for him.  His wife was present at today's session.        Barriers          Psychosocial barriers to participate in program  There are no identifiable barriers or psychosocial needs.        Screening Interventions          Interventions  Encouraged to exercise           Quality of Life Scores: Quality of  Life - 04/30/18 1152    Quality of Life Scores          Health/Function Pre  20.27 %    Socioeconomic Pre  27.21 %    Psych/Spiritual Pre  24.86 %    Family Pre  27 %    GLOBAL Pre  23.53 %          Scores of 19 and below usually indicate a poorer quality of life in these areas.  A difference of  2-3 points is a clinically meaningful difference.  A difference of 2-3 points in the total score of the Quality of Life Index has been associated with significant improvement in overall quality of life, self-image, physical symptoms, and general health in studies assessing change in quality of life.  PHQ-9: Recent Review Flowsheet Data    Depression screen [facility] 2/9 05/04/2018   Decreased Interest 0   Down, Depressed, Hopeless 0   PHQ - 2 Score 0     Interpretation of Total Score  Total Score Depression Severity:  1-4 = Minimal depression, 5-9 = Mild depression, 10-14 = Moderate depression, 15-19 = Moderately severe depression, 20-27 = Severe depression   Psychosocial Evaluation and Intervention: Psychosocial  Evaluation - 05/04/18 1032    Psychosocial Evaluation & Interventions          Interventions  Encouraged to exercise with the program and follow exercise prescription    Comments  no psychosocial needs identified, no interventions necessary. pt enjoys hiking in his neighborhood.     Expected Outcomes  pt will exhibit positive outlook with good coping skills.     Continue Psychosocial Services   No Follow up required           Psychosocial Re-Evaluation: Psychosocial Re-Evaluation    Psychosocial Re-Evaluation    Row Name 05/07/18 1100 05/26/18 0749 07/01/18 1637   Current issues with  None Identified  None Identified  None Identified   Comments  no psychosocial needs identified, no interventions necessary  no psychosocial needs identified, no interventions necessary  no psychosocial needs identified, no interventions necessary   Expected Outcomes  pt will exhibit positive outlook with good coping skills.   pt will exhibit positive outlook with good coping skills.   pt will exhibit positive outlook with good coping skills.    Interventions  Encouraged to attend Cardiac Rehabilitation for the exercise  Encouraged to attend Cardiac Rehabilitation for the exercise  Encouraged to attend Cardiac Rehabilitation for the exercise   Continue Psychosocial Services   No Follow up required  No Follow up required  No Follow up required          Psychosocial Discharge (Final Psychosocial Re-Evaluation): Psychosocial Re-Evaluation - 07/01/18 1637    Psychosocial Re-Evaluation          Current issues with  None Identified    Comments  no psychosocial needs identified, no interventions necessary    Expected Outcomes  pt will exhibit positive outlook with good coping skills.     Interventions  Encouraged to attend Cardiac Rehabilitation for the exercise    Continue Psychosocial Services   No Follow up required           Vocational Rehabilitation: Provide vocational rehab assistance to  qualifying candidates.   Vocational Rehab Evaluation & Intervention: Vocational Rehab - 04/30/18 1222    Initial Vocational Rehab Evaluation & Intervention          Assessment shows need for Vocational Rehabilitation  No  Education: Education Goals: Education classes will be provided on a weekly basis, covering required topics. Participant will state understanding/return demonstration of topics presented.  Learning Barriers/Preferences: Learning Barriers/Preferences - 04/30/18 1610    Learning Barriers/Preferences          Learning Barriers  Sight    Learning Preferences  Skilled Demonstration;Verbal Instruction;Written Material;Video           Education Topics: Count Your Pulse:  -Group instruction provided by verbal instruction, demonstration, patient participation and written materials to support subject.  Instructors address importance of being able to find your pulse and how to count your pulse when at home without a heart monitor.  Patients get hands on experience counting their pulse with staff help and individually. Flowsheet Row CARDIAC REHAB PHASE II EXERCISE from 07/01/2018 in [facility]  Date  05/22/18  Instruction Review Code  2- Demonstrated Understanding      Heart Attack, Angina, and Risk Factor Modification:  -Group instruction provided by verbal instruction, video, and written materials to support subject.  Instructors address signs and symptoms of angina and heart attacks.    Also discuss risk factors for heart disease and how to make changes to improve heart health risk factors. Flowsheet Row CARDIAC REHAB PHASE II EXERCISE from 07/01/2018 in [place]  Date  07/01/18  Educator  RN  Instruction Review Code  2- Demonstrated Understanding      Functional Fitness:  -Group instruction provided by verbal instruction, demonstration, patient participation, and written materials to support  subject.  Instructors address safety measures for doing things around the house.  Discuss how to get up and down off the floor, how to pick things up properly, how to safely get out of a chair without assistance, and balance training. Flowsheet Row CARDIAC REHAB PHASE II EXERCISE from 07/01/2018 in [place]  Date  06/05/18  Instruction Review Code  2- Demonstrated Understanding      Meditation and Mindfulness:  -Group instruction provided by verbal instruction, patient participation, and written materials to support subject.  Instructor addresses importance of mindfulness and meditation practice to help reduce stress and improve awareness.  Instructor also leads participants through a meditation exercise.  Flowsheet Row CARDIAC REHAB PHASE II EXERCISE from 07/01/2018 in [place]  Date  06/10/18  Educator  Jeanella Craze  Instruction Review Code  2- Demonstrated Understanding      Stretching for Flexibility and Mobility:  -Group instruction provided by verbal instruction, patient participation, and written materials to support subject.  Instructors lead participants through series of stretches that are designed to increase flexibility thus improving mobility.  These stretches are additional exercise for major muscle groups that are typically performed during regular warm up and cool down.   Hands Only CPR:  -Group verbal, video, and participation provides a basic overview of AHA guidelines for community CPR. Role-play of emergencies allow participants the opportunity to practice calling for help and chest compression technique with discussion of AED use.   Hypertension: -Group verbal and written instruction that provides a basic overview of hypertension including the most recent diagnostic guidelines, risk factor reduction with self-care instructions and medication management. Flowsheet Row CARDIAC REHAB PHASE II EXERCISE from  07/01/2018 in [place]  Date  06/12/18  Educator  RN  Instruction Review Code  2- Demonstrated Understanding       Nutrition I class: Heart Healthy  Eating:  -Group instruction provided by PowerPoint slides, verbal discussion, and written materials to support subject matter. The instructor gives an explanation and review of the Therapeutic Lifestyle Changes diet recommendations, which includes a discussion on lipid goals, dietary fat, sodium, fiber, plant stanol/sterol esters, sugar, and the components of a well-balanced, healthy diet. Flowsheet Row CARDIAC REHAB PHASE II EXERCISE from 07/01/2018 in [place]  Date  04/30/18  Educator  RD      Nutrition II class: Lifestyle Skills:  -Group instruction provided by PowerPoint slides, verbal discussion, and written materials to support subject matter. The instructor gives an explanation and review of label reading, grocery shopping for heart health, heart healthy recipe modifications, and ways to make healthier choices when eating out.   Diabetes Question & Answer:  -Group instruction provided by PowerPoint slides, verbal discussion, and written materials to support subject matter. The instructor gives an explanation and review of diabetes co-morbidities, pre- and post-prandial blood glucose goals, pre-exercise blood glucose goals, signs, symptoms, and treatment of hypoglycemia and hyperglycemia, and foot care basics.   Diabetes Blitz:  -Group instruction provided by PowerPoint slides, verbal discussion, and written materials to support subject matter. The instructor gives an explanation and review of the physiology behind type 1 and type 2 diabetes, diabetes medications and rational behind using different medications, pre- and post-prandial blood glucose recommendations and Hemoglobin A1c goals, diabetes diet, and exercise including blood glucose guidelines for exercising safely.     Portion Distortion:  -Group instruction provided by PowerPoint slides, verbal discussion, written materials, and food models to support subject matter. The instructor gives an explanation of serving size versus portion size, changes in portions sizes over the last 20 years, and what consists of a serving from each food group.   Stress Management:  -Group instruction provided by verbal instruction, video, and written materials to support subject matter.  Instructors review role of stress in heart disease and how to cope with stress positively.   Flowsheet Row CARDIAC REHAB PHASE II EXERCISE from 07/01/2018 in [place]  Date  06/17/18  Educator  RN  Instruction Review Code  2- Demonstrated Understanding      Exercising on Your Own:  -Group instruction provided by verbal instruction, power point, and written materials to support subject.  Instructors discuss benefits of exercise, components of exercise, frequency and intensity of exercise, and end points for exercise.  Also discuss use of nitroglycerin and activating EMS.  Review options of places to exercise outside of rehab.  Review guidelines for sex with heart disease.   Cardiac Drugs I:  -Group instruction provided by verbal instruction and written materials to support subject.  Instructor reviews cardiac drug classes: antiplatelets, anticoagulants, beta blockers, and statins.  Instructor discusses reasons, side effects, and lifestyle considerations for each drug class. Flowsheet Row CARDIAC REHAB PHASE II EXERCISE from 07/01/2018 in [place]  Date  06/03/18  Instruction Review Code  2- Demonstrated Understanding      Cardiac Drugs II:  -Group instruction provided by verbal instruction and written materials to support subject.  Instructor reviews cardiac drug classes: angiotensin converting enzyme inhibitors (ACE-I), angiotensin II receptor blockers (ARBs), nitrates,  and calcium channel blockers.  Instructor discusses reasons, side effects, and lifestyle considerations for each drug class. Flowsheet Row CARDIAC REHAB PHASE II EXERCISE from 07/01/2018 in [place]  Date  05/06/18  Instruction Review Code  2- Demonstrated Understanding  Anatomy and Physiology of the Circulatory System:  Group verbal and written instruction and models provide basic cardiac anatomy and physiology, with the coronary electrical and arterial systems. Review of: AMI, Angina, Valve disease, Heart Failure, Peripheral Artery Disease, Cardiac Arrhythmia, Pacemakers, and the ICD. Flowsheet Row CARDIAC REHAB PHASE II EXERCISE from 07/01/2018 in [place]  Date  06/24/18  Educator  RN  Instruction Review Code  2- Demonstrated Understanding      Other Education:  -Group or individual verbal, written, or video instructions that support the educational goals of the cardiac rehab program.   Holiday Eating Survival Tips:  -Group instruction provided by PowerPoint slides, verbal discussion, and written materials to support subject matter. The instructor gives patients tips, tricks, and techniques to help them not only survive but enjoy the holidays despite the onslaught of food that accompanies the holidays.   Knowledge Questionnaire Score: Knowledge Questionnaire Score - 04/30/18 1129    Knowledge Questionnaire Score          Pre Score  18/24           Core Components/Risk Factors/Patient Goals at Admission: Personal Goals and Risk Factors at Admission - 04/30/18 1146    Core Components/Risk Factors/Patient Goals on Admission           Weight Management  Yes;Obesity    Intervention  Weight Management: Develop a combined nutrition and exercise program designed to reach desired caloric intake, while maintaining appropriate intake of nutrient and fiber, sodium and fats, and appropriate energy expenditure required  for the weight goal.;Weight Management: Provide education and appropriate resources to help participant work on and attain dietary goals.;Weight Management/Obesity: Establish reasonable short term and long term weight goals.    Admit Weight  211 lb 13.8 oz (96.1 kg)    Goal Weight: Short Term  200 lb (90.7 kg)    Goal Weight: Long Term  190 lb (86.2 kg)    Expected Outcomes  Short Term: Continue to assess and modify interventions until short term weight is achieved;Long Term: Adherence to nutrition and physical activity/exercise program aimed toward attainment of established weight goal;Weight Maintenance: Understanding of the daily nutrition guidelines, which includes 25-35% calories from fat, 7% or less cal from saturated fats, less than 200mg  cholesterol, less than 1.5gm of sodium, & 5 or more servings of fruits and vegetables daily;Weight Loss: Understanding of general recommendations for a balanced deficit meal plan, which promotes 1-2 lb weight loss per week and includes a negative energy balance of [phone] kcal/d;Understanding recommendations for meals to include 15-35% energy as protein, 25-35% energy from fat, 35-60% energy from carbohydrates, less than 200mg  of dietary cholesterol, 20-35 gm of total fiber daily;Understanding of distribution of calorie intake throughout the day with the consumption of 4-5 meals/snacks    Heart Failure  Yes    Intervention  Provide a combined exercise and nutrition program that is supplemented with education, support and counseling about heart failure. Directed toward relieving symptoms such as shortness of breath, decreased exercise tolerance, and extremity edema.    Expected Outcomes  Improve functional capacity of life;Short term: Attendance in program 2-3 days a week with increased exercise capacity. Reported lower sodium intake. Reported increased fruit and vegetable intake. Reports medication compliance.;Short term: Daily weights obtained and reported for  increase. Utilizing diuretic protocols set by physician.;Long term: Adoption of self-care skills and reduction of barriers for early signs and symptoms recognition and intervention leading to self-care maintenance.    Hypertension  Yes    Intervention  Provide education on lifestyle modifcations including regular physical activity/exercise, weight management, moderate sodium restriction and increased consumption of fresh fruit, vegetables, and low fat dairy, alcohol moderation, and smoking cessation.;Monitor prescription use compliance.    Expected Outcomes  Short Term: Continued assessment and intervention until BP is < 140/6mm HG in hypertensive participants. < 130/26mm HG in hypertensive participants with diabetes, heart failure or chronic kidney disease.;Long Term: Maintenance of blood pressure at goal levels.    Lipids  Yes    Intervention  Provide education and support for participant on nutrition & aerobic/resistive exercise along with prescribed medications to achieve LDL 70mg , HDL >40mg .    Expected Outcomes  Short Term: Participant states understanding of desired cholesterol values and is compliant with medications prescribed. Participant is following exercise prescription and nutrition guidelines.;Long Term: Cholesterol controlled with medications as prescribed, with individualized exercise RX and with personalized nutrition plan. Value goals: LDL < 70mg , HDL > 40 mg.           Core Components/Risk Factors/Patient Goals Review:  Goals and Risk Factor Review    Core Components/Risk Factors/Patient Goals Review    Row Name 05/04/18 1026 05/26/18 0749 07/01/18 1636   Personal Goals Review  Weight Management/Obesity;Heart Failure;Hypertension;Lipids  Weight Management/Obesity;Heart Failure;Hypertension;Lipids  Weight Management/Obesity;Heart Failure;Hypertension;Lipids   Review  pt with multiple CAD RF demonstrates willingness to participate in CR exercise program. pt personal goal is lose  weight to 180lb. pt encouraged to participate in CR exercise,nutrition and risk factor education classes.   pt with multiple CAD RF demonstrates willingness to participate in CR exercise program. pt personal goal is lose weight to 180lb. pt pleased with improving endurance.   pt with multiple CAD RF demonstrates willingness to participate in CR exercise program. pt personal goal is lose weight to 180lb. pt maintaining stable weight.  pt pleased with improving endurance.    Expected Outcomes  pt will participate in CR Exercise, nutrition and lifestyle modification opportunities.   pt will participate in CR Exercise, nutrition and lifestyle modification opportunities.   pt will participate in CR Exercise, nutrition and lifestyle modification opportunities.           Core Components/Risk Factors/Patient Goals at Discharge (Final Review):  Goals and Risk Factor Review - 07/01/18 1636    Core Components/Risk Factors/Patient Goals Review          Personal Goals Review  Weight Management/Obesity;Heart Failure;Hypertension;Lipids    Review  pt with multiple CAD RF demonstrates willingness to participate in CR exercise program. pt personal goal is lose weight to 180lb. pt maintaining stable weight.  pt pleased with improving endurance.     Expected Outcomes  pt will participate in CR Exercise, nutrition and lifestyle modification opportunities.            ITP Comments: ITP Comments    Row Name 04/30/18 0812 05/04/18 1024 05/07/18 1058 05/26/18 0748   ITP Comments  Dr. Fransico Him, Medical Director  pt started group exercise session today.  pt tolerated light activity without difficulty.  pt oriented to exercise equipment and safety routine.    30 day ITP review.  pt demonstrates willingness to participate in group exercise program.  30 day ITP review.  pt demonstrates willingness to participate in group exercise program.      Comments:

## 2018-07-03 ENCOUNTER — Encounter (HOSPITAL_COMMUNITY)
Admission: RE | Admit: 2018-07-03 | Discharge: 2018-07-03 | Disposition: A | Discharge status: Home / Self Care | Payer: Medicare Other | Source: Ambulatory Visit | Attending: Interventional Cardiology | Admitting: Interventional Cardiology

## 2018-07-03 DIAGNOSIS — Z48812 Encounter for surgical aftercare following surgery on the circulatory system: Secondary | ICD-10-CM | POA: Diagnosis not present

## 2018-07-03 DIAGNOSIS — Z951 Presence of aortocoronary bypass graft: Secondary | ICD-10-CM

## 2018-07-06 ENCOUNTER — Encounter (HOSPITAL_COMMUNITY)
Admission: RE | Admit: 2018-07-06 | Discharge: 2018-07-06 | Disposition: A | Discharge status: Home / Self Care | Payer: Medicare Other | Source: Ambulatory Visit | Attending: Interventional Cardiology | Admitting: Interventional Cardiology

## 2018-07-06 DIAGNOSIS — Z951 Presence of aortocoronary bypass graft: Secondary | ICD-10-CM | POA: Diagnosis not present

## 2018-07-06 DIAGNOSIS — Z48812 Encounter for surgical aftercare following surgery on the circulatory system: Secondary | ICD-10-CM | POA: Diagnosis not present

## 2018-07-08 ENCOUNTER — Encounter (HOSPITAL_COMMUNITY)
Admission: RE | Admit: 2018-07-08 | Discharge: 2018-07-08 | Disposition: A | Discharge status: Home / Self Care | Payer: Medicare Other | Source: Ambulatory Visit | Attending: Interventional Cardiology | Admitting: Interventional Cardiology

## 2018-07-08 DIAGNOSIS — Z951 Presence of aortocoronary bypass graft: Secondary | ICD-10-CM | POA: Diagnosis not present

## 2018-07-08 DIAGNOSIS — Z48812 Encounter for surgical aftercare following surgery on the circulatory system: Secondary | ICD-10-CM | POA: Diagnosis not present

## 2018-07-10 ENCOUNTER — Encounter (HOSPITAL_COMMUNITY)
Admission: RE | Admit: 2018-07-10 | Discharge: 2018-07-10 | Disposition: A | Discharge status: Home / Self Care | Payer: Medicare Other | Source: Ambulatory Visit | Attending: Interventional Cardiology | Admitting: Interventional Cardiology

## 2018-07-10 DIAGNOSIS — Z951 Presence of aortocoronary bypass graft: Secondary | ICD-10-CM

## 2018-07-10 DIAGNOSIS — Z48812 Encounter for surgical aftercare following surgery on the circulatory system: Secondary | ICD-10-CM | POA: Diagnosis not present

## 2018-07-13 ENCOUNTER — Encounter (HOSPITAL_COMMUNITY)
Admission: RE | Admit: 2018-07-13 | Discharge: 2018-07-13 | Disposition: A | Discharge status: Home / Self Care | Payer: Medicare Other | Source: Ambulatory Visit | Attending: Interventional Cardiology | Admitting: Interventional Cardiology

## 2018-07-13 VITALS — Ht 70.0 in | Wt 181.7 lb

## 2018-07-13 DIAGNOSIS — Z951 Presence of aortocoronary bypass graft: Secondary | ICD-10-CM

## 2018-07-13 DIAGNOSIS — Z48812 Encounter for surgical aftercare following surgery on the circulatory system: Secondary | ICD-10-CM | POA: Diagnosis not present

## 2018-07-15 ENCOUNTER — Encounter (HOSPITAL_COMMUNITY)
Admission: RE | Admit: 2018-07-15 | Discharge: 2018-07-15 | Disposition: A | Discharge status: Home / Self Care | Payer: Medicare Other | Source: Ambulatory Visit | Attending: Interventional Cardiology | Admitting: Interventional Cardiology

## 2018-07-15 DIAGNOSIS — Z951 Presence of aortocoronary bypass graft: Secondary | ICD-10-CM

## 2018-07-15 DIAGNOSIS — Z48812 Encounter for surgical aftercare following surgery on the circulatory system: Secondary | ICD-10-CM | POA: Diagnosis not present

## 2018-07-17 ENCOUNTER — Encounter (HOSPITAL_COMMUNITY)
Admission: RE | Admit: 2018-07-17 | Discharge: 2018-07-17 | Disposition: A | Discharge status: Home / Self Care | Payer: Medicare Other | Source: Ambulatory Visit | Attending: Interventional Cardiology | Admitting: Interventional Cardiology

## 2018-07-17 DIAGNOSIS — Z48812 Encounter for surgical aftercare following surgery on the circulatory system: Secondary | ICD-10-CM | POA: Diagnosis not present

## 2018-07-17 DIAGNOSIS — Z951 Presence of aortocoronary bypass graft: Secondary | ICD-10-CM

## 2018-07-20 ENCOUNTER — Encounter (HOSPITAL_COMMUNITY)
Admission: RE | Admit: 2018-07-20 | Discharge: 2018-07-20 | Disposition: A | Discharge status: Home / Self Care | Payer: Medicare Other | Source: Ambulatory Visit | Attending: Interventional Cardiology | Admitting: Interventional Cardiology

## 2018-07-20 DIAGNOSIS — Z951 Presence of aortocoronary bypass graft: Secondary | ICD-10-CM | POA: Diagnosis not present

## 2018-07-20 DIAGNOSIS — Z48812 Encounter for surgical aftercare following surgery on the circulatory system: Secondary | ICD-10-CM | POA: Diagnosis not present

## 2018-07-22 ENCOUNTER — Encounter (HOSPITAL_COMMUNITY)
Admission: RE | Admit: 2018-07-22 | Discharge: 2018-07-22 | Disposition: A | Discharge status: Home / Self Care | Payer: Medicare Other | Source: Ambulatory Visit | Attending: Interventional Cardiology | Admitting: Interventional Cardiology

## 2018-07-22 DIAGNOSIS — Z951 Presence of aortocoronary bypass graft: Secondary | ICD-10-CM | POA: Diagnosis not present

## 2018-07-22 DIAGNOSIS — Z48812 Encounter for surgical aftercare following surgery on the circulatory system: Secondary | ICD-10-CM | POA: Diagnosis not present

## 2018-07-24 ENCOUNTER — Encounter (HOSPITAL_COMMUNITY)
Admission: RE | Admit: 2018-07-24 | Discharge: 2018-07-24 | Disposition: A | Discharge status: Home / Self Care | Payer: Medicare Other | Source: Ambulatory Visit | Attending: Interventional Cardiology | Admitting: Interventional Cardiology

## 2018-07-24 DIAGNOSIS — Z951 Presence of aortocoronary bypass graft: Secondary | ICD-10-CM | POA: Diagnosis not present

## 2018-07-24 DIAGNOSIS — Z48812 Encounter for surgical aftercare following surgery on the circulatory system: Secondary | ICD-10-CM | POA: Diagnosis not present

## 2018-07-29 ENCOUNTER — Encounter (HOSPITAL_COMMUNITY): Payer: Medicare Other

## 2018-07-30 NOTE — Progress Notes (Signed)
Cardiac Individual Treatment Plan  Patient Details  Name: JANZEN SACKS MRN: [id number] Date of Birth: [birth year] Referring Provider:   Flowsheet Row CARDIAC REHAB PHASE II ORIENTATION from 04/30/2018 in [facility]  Referring Provider  Daneen Schick MD      Initial Encounter Date:  [place] from 04/30/2018 in [place]  Date  04/30/18      Visit Diagnosis: S/P CABG x 2  Patient's Home Medications on Admission:  Current Outpatient Medications:  .  aspirin EC 325 MG tablet, Take 325 mg by mouth daily., Disp: , Rfl:  .  furosemide (LASIX) 40 MG tablet, Take 1 tablet (40 mg total) by mouth daily., Disp: 90 tablet, Rfl: 3 .  Melatonin 5 MG CAPS, Take 1 capsule by mouth as needed (sleep)., Disp: , Rfl:  .  rosuvastatin (CRESTOR) 20 MG tablet, Take 1 tablet (20 mg total) by mouth daily., Disp: 90 tablet, Rfl: 3 .  losartan (COZAAR) 50 MG tablet, Take 1 tablet (50 mg total) by mouth daily., Disp: 90 tablet, Rfl: 3 .  metoprolol tartrate (LOPRESSOR) 25 MG tablet, Take 0.5 tablets (12.5 mg total) by mouth 2 (two) times daily., Disp: 90 tablet, Rfl: 3  Past Medical History: Past Medical History:  Diagnosis Date  . Acute diastolic (congestive) heart failure ([place]) 02/13/2018  . Arthritis   . 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 CAD (coronary artery disease) 02/24/2018  . CAD in native artery 04/26/2015   2011 Multivessel DES PCI LAD,RCA PLOM and distal RCA   . Coronary artery disease   . Dementia   . Essential hypertension 04/21/2014  . Hyperlipidemia   . Parkinson's disease ([place]) 04/27/2015   2016   . Paroxysmal atrial fibrillation ([place]) 03/17/2018  . 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 Stented coronary artery 2011   x3    Tobacco Use: Social History   Tobacco Use  Smoking Status Never Smoker  Smokeless Tobacco Never Used     Labs: Recent Review Flowsheet Data    Labs for ITP Cardiac and Pulmonary Rehab Latest Ref Rng & Units 02/25/2018 02/25/2018 02/26/2018 03/10/2018 06/09/2018   Cholestrol 100 - 199 mg/dL - - - - 157   LDLCALC 0 - 99 mg/dL - - - - 81   LDLDIRECT mg/dL - - - - -   HDL >39 mg/dL - - - - 37(L)   Trlycerides 0 - 149 mg/dL - - - - 195(H)   Hemoglobin A1c 4.8 - 5.6 % - - - - -   PHART 7.350 - 7.450 7.370 - 7.379 - -   PCO2ART 32.0 - 48.0 mmHg 45.4 - 41.4 - -   HCO3 20.0 - 28.0 mmol/L 26.2 - 23.6 - -   TCO2 22 - 32 mmol/L '28 23 24 23 ' -   ACIDBASEDEF 0.0 - 2.0 mmol/L - - 0.7 - -   O2SAT % 98.0 - 92.3 - -      Capillary Blood Glucose: Lab Results  Component Value Date   GLUCAP 146 (H) 02/27/2018   GLUCAP 129 (H) 02/27/2018   GLUCAP 141 (H) 02/27/2018   GLUCAP 120 (H) 02/26/2018   GLUCAP 158 (H) 02/26/2018     Exercise Target Goals: Exercise Program Goal: Individual exercise prescription set using results from initial 6 min walk test and THRR while considering  patient's activity barriers  and safety.   Exercise Prescription Goal: Initial exercise prescription builds to 30-45 minutes a day of aerobic activity, 2-3 days per week.  Home exercise guidelines will be given to patient during program as part of exercise prescription that the participant will acknowledge.  Activity Barriers & Risk Stratification: Activity Barriers & Cardiac Risk Stratification - 04/30/18 0814    Activity Barriers & Cardiac Risk Stratification          Activity Barriers  Muscular Weakness;Deconditioning;Incisional Pain;Shortness of Breath    Cardiac Risk Stratification  High           6 Minute Walk: 6 Minute Walk    6 Minute Walk    Row Name 04/30/18 1130 04/30/18 1139 07/13/18 1146   Phase  Initial  no documentation  Discharge   Distance  no documentation  1056 feet  1638 feet   Distance % Change  no documentation  no documentation  55.11 %   Distance Feet Change  no documentation  no documentation   582 ft   Walk Time  no documentation  6 minutes  6 minutes   # of Rest Breaks  no documentation  0  0   MPH  no documentation  2  3.1   METS  no documentation  1.7  2.9   RPE  no documentation  11  13   VO2 Peak  no documentation  5.8  10.64   Symptoms  no documentation  Yes (comment)  No   Comments  no documentation  abdominal discomfort  no documentation   Resting HR  no documentation  63 bpm  60 bpm   Resting BP  no documentation  118/64  118/80   Resting Oxygen Saturation   no documentation  97 %  no documentation   Exercise Oxygen Saturation  during 6 min walk  no documentation  99 %  no documentation   Max Ex. HR  no documentation  74 bpm  80 bpm   Max Ex. BP  no documentation  124/80  134/62   2 Minute Post BP  no documentation  118/64  no documentation       6 Minute Walk    Row Name 07/13/18 1641   2 Minute Post BP  104/62          Oxygen Initial Assessment:   Oxygen Re-Evaluation:   Oxygen Discharge (Final Oxygen Re-Evaluation):   Initial Exercise Prescription: Initial Exercise Prescription - 04/30/18 1100    Date of Initial Exercise RX and Referring Provider          Date  04/30/18    Referring Provider  Daneen Schick MD        Recumbant Bike          Level  1.5    Minutes  10    METs  1.5        NuStep          Level  2    SPM  60    Minutes  10    METs  1.8        Track          Laps  7    Minutes  10    METs  2.23        Prescription Details          Frequency (times per week)  3    Duration  Progress to 30 minutes of continuous aerobic without signs/symptoms of physical distress  Intensity          THRR 40-80% of Max Heartrate  57-114    Ratings of Perceived Exertion  11-13    Perceived Dyspnea  0-4        Progression          Progression  Continue to progress workloads to maintain intensity without signs/symptoms of physical distress.        Resistance Training          Training Prescription  Yes    Weight   2lbs    Reps  10-15           Perform Capillary Blood Glucose checks as needed.  Exercise Prescription Changes: Exercise Prescription Changes    Response to Exercise    Row Name 05/04/18 1446 05/05/18 1400 05/11/18 1632 05/11/18 1634 05/25/18 1455   Blood Pressure (Admit)  122/60  no documentation  116/70  116/70  122/64   Blood Pressure (Exercise)  140/80  no documentation  120/70  120/70  124/70   Blood Pressure (Exit)  122/70  no documentation  104/68  104/68  104/70   Heart Rate (Admit)  54 bpm  no documentation  57 bpm  57 bpm  65 bpm   Heart Rate (Exercise)  70 bpm  no documentation  88 bpm  88 bpm  75 bpm   Heart Rate (Exit)  49 bpm  no documentation  50 bpm  50 bpm  55 bpm   Rating of Perceived Exertion (Exercise)  13  no documentation  '12  12  12   ' Symptoms  none  no documentation  none  nonr  none   Comments  pt was oriented to exercise equipment  no documentation  no documentation  no documentation  no documentation   Duration  Continue with 30 min of aerobic exercise without signs/symptoms of physical distress.  no documentation  Continue with 30 min of aerobic exercise without signs/symptoms of physical distress.  Continue with 30 min of aerobic exercise without signs/symptoms of physical distress.  Continue with 30 min of aerobic exercise without signs/symptoms of physical distress.   Intensity  THRR unchanged  no documentation  THRR unchanged  THRR unchanged  THRR unchanged       Progression    Row Name 05/04/18 1446 05/05/18 1400 05/11/18 1632 05/11/18 1634 05/25/18 1455   Progression  Continue to progress workloads to maintain intensity without signs/symptoms of physical distress.  no documentation  Continue to progress workloads to maintain intensity without signs/symptoms of physical distress.  Continue to progress workloads to maintain intensity without signs/symptoms of physical distress.  Continue to progress workloads to maintain intensity without signs/symptoms of  physical distress.   Average METs  2.4  no documentation  2.5  2.8  2.8       Resistance Training    Row Name 05/04/18 1446 05/05/18 1400 05/11/18 1632 05/11/18 1634 05/25/18 1455   Training Prescription  Yes  no documentation  Yes  Yes  Yes   Weight  2lbs  no documentation  2lbs  2lbs  2lbs   Reps  10-15  no documentation  10-15  10-15  10-15   Time  10 Minutes  no documentation  10 Minutes  10 Minutes  [place] Name 05/04/18 1446 05/05/18 1400 05/11/18 1632 05/11/18 1634 05/25/18 1455   Level  1.5  no documentation  1.5  1.5  3  Minutes  10  no documentation  '10  10  10   ' METs  2.5  no documentation  3.11  3.1  2.1       NuStep    Row Name 05/04/18 1446 05/05/18 1400 05/11/18 1632 05/11/18 1634 05/25/18 1455   Level  2  no documentation  '2  3  3   ' SPM  60  no documentation  60  70  70   Minutes  10  no documentation  '10  10  10   ' METs  1.8  no documentation  2.1  2.1  2.5       Track    Row Name 05/04/18 1446 05/05/18 1400 05/11/18 1632 05/11/18 1634 05/25/18 1455   Laps  11  no documentation  '13  13  16   ' Minutes  10  no documentation  '10  10  10   ' METs  2.92  no documentation  2.92  3.26  3.79       [place] Name 05/04/18 1446 05/05/18 1400 05/11/18 1632 05/11/18 1634 05/25/18 1455   Plans to continue exercise at  no documentation  no documentation  no documentation  Home (comment) walking  Home (comment) walking   Frequency  no documentation  no documentation  no documentation  Add 2 additional days to program exercise sessions.  Add 2 additional days to program exercise sessions.   Initial Home Exercises Provided  no documentation  no documentation  no documentation  05/11/18  05/11/18       Response to Exercise    Row Name 06/08/18 1621 06/26/18 1200 07/08/18 1100 07/24/18 1430   Blood Pressure (Admit)  124/60  102/64  118/70  110/70   Blood Pressure (Exercise)  114/62  110/60  124/60  136/60   Blood Pressure (Exit)   102/58  104/62  100/62  108/60   Heart Rate (Admit)  64 bpm  58 bpm  64 bpm  57 bpm   Heart Rate (Exercise)  78 bpm  83 bpm  80 bpm  87 bpm   Heart Rate (Exit)  59 bpm  58 bpm  57 bpm  57 bpm   Rating of Perceived Exertion (Exercise)  '13  13  12  12   ' Perceived Dyspnea (Exercise)  no documentation  no documentation  no documentation  0   Symptoms  none  none  none  None   Comments  no documentation  no documentation  no documentation  Pt graduated from Cardiac Rehab   Duration  Continue with 30 min of aerobic exercise without signs/symptoms of physical distress.  Continue with 30 min of aerobic exercise without signs/symptoms of physical distress.  Continue with 30 min of aerobic exercise without signs/symptoms of physical distress.  Continue with 30 min of aerobic exercise without signs/symptoms of physical distress.   Intensity  THRR unchanged  THRR unchanged  THRR unchanged  THRR unchanged       Progression    Row Name 06/08/18 1621 06/26/18 1200 07/08/18 1100 07/24/18 1430   Progression  Continue to progress workloads to maintain intensity without signs/symptoms of physical distress.  Continue to progress workloads to maintain intensity without signs/symptoms of physical distress.  Continue to progress workloads to maintain intensity without signs/symptoms of physical distress.  Continue to progress workloads to maintain intensity without signs/symptoms of physical distress.   Average METs  3.2  3.6  3.5  3.5  Resistance Training    Row Name 06/08/18 1621 06/26/18 1200 07/08/18 1100 07/24/18 1430   Training Prescription  Yes  Yes  No relaxation day  Yes   Weight  3lbs  4lbs  no documentation  4lbs   Reps  10-15  10-15  no documentation  10-15   Time  10 Minutes  10 Minutes  10 Minutes  10 [place] Name 06/08/18 1621 06/26/18 1200 07/08/18 1100 07/24/18 1430   Level  3.5  3.5  3.5  3.5   Minutes  '10  10  10  10   ' METs  3.1  3  2.7  2.8       NuStep     Row Name 06/08/18 1621 06/26/18 1200 07/08/18 1100 07/24/18 1430   Level  '4  5  5  5   ' SPM  80  90  85  85   Minutes  '10  10  10  10   ' METs  3.1  4.5  4  3.9       Track    Row Name 06/08/18 1621 06/26/18 1200 07/08/18 1100 07/24/18 1430   Laps  '13  14  16  13   ' Minutes  '10  10  10  10   ' METs  3.26  3.43  3.79  3.3       Home Exercise Plan    Row Name 06/08/18 1621 06/26/18 1200 07/08/18 1100 07/24/18 1430   Plans to continue exercise at  Home (comment) walking  Home (comment) walking  Home (comment) walking  Home (comment) walking   Frequency  Add 2 additional days to program exercise sessions.  Add 2 additional days to program exercise sessions.  Add 2 additional days to program exercise sessions.  Add 2 additional days to program exercise sessions.   Initial Home Exercises Provided  05/11/18  05/11/18  05/11/18  05/11/18          Exercise Comments: Exercise Comments    Row Name 05/04/18 1443 06/03/18 1232 06/17/18 1532 07/20/18 1723 07/30/18 1542   Exercise Comments  Pt was oriented to exercise equipment. Pt was able to exercise safely for 30 minutes without any signs/symptoms of physical distress.  Reviewed METs and goals. Pt is tolerating exercise program fairly well. Pt will continue to exercise for 30 minutes without signs and symtpoms of physical distress.   Reviewed METs and goals. Pt is tolerating exercise program fairly well. Pt will continue to exercise for 30 minutes without signs and symtpoms of physical distress.   Reviewed METs and goals. Pt is tolerating exercise program fairly well. Pt will continue to exercise for 30 minutes without signs and symtpoms of physical distress.   Pt graduated from Cardiac Rehab, with all 36 sessions completed. Pt made good progress while in rehab. Spoke with pt's wife about post exercise plans. Pt's wife is determined to keep pt exercising post rehab.       Exercise Goals and Review: Exercise Goals    Exercise Goals    Row Name  04/30/18 0816   Increase Physical Activity  Yes   Intervention  Provide advice, education, support and counseling about physical activity/exercise needs.;Develop an individualized exercise prescription for aerobic and resistive training based on initial evaluation findings, risk stratification, comorbidities and participant's personal goals.   Expected Outcomes  Short Term: Attend rehab on a regular basis to increase amount of physical activity.;Long Term: Exercising  regularly at least 3-5 days a week.;Long Term: Add in home exercise to make exercise part of routine and to increase amount of physical activity.   Increase Strength and Stamina  Yes   Intervention  Provide advice, education, support and counseling about physical activity/exercise needs.;Develop an individualized exercise prescription for aerobic and resistive training based on initial evaluation findings, risk stratification, comorbidities and participant's personal goals.   Expected Outcomes  Short Term: Increase workloads from initial exercise prescription for resistance, speed, and METs.;Long Term: Improve cardiorespiratory fitness, muscular endurance and strength as measured by increased METs and functional capacity (6MWT);Short Term: Perform resistance training exercises routinely during rehab and add in resistance training at home   Able to understand and use rate of perceived exertion (RPE) scale  Yes   Intervention  Provide education and explanation on how to use RPE scale   Expected Outcomes  Short Term: Able to use RPE daily in rehab to express subjective intensity level;Long Term:  Able to use RPE to guide intensity level when exercising independently   Knowledge and understanding of Target Heart Rate Range (THRR)  Yes   Intervention  Provide education and explanation of THRR including how the numbers were predicted and where they are located for reference   Expected Outcomes  Short Term: Able to state/look up THRR;Long Term:  Able to use THRR to govern intensity when exercising independently;Short Term: Able to use daily as guideline for intensity in rehab   Able to check pulse independently  Yes   Intervention  Provide education and demonstration on how to check pulse in carotid and radial arteries.;Review the importance of being able to check your own pulse for safety during independent exercise   Expected Outcomes  Short Term: Able to explain why pulse checking is important during independent exercise;Long Term: Able to check pulse independently and accurately   Understanding of Exercise Prescription  Yes   Intervention  Provide education, explanation, and written materials on patient's individual exercise prescription   Expected Outcomes  Long Term: Able to explain home exercise prescription to exercise independently          Exercise Goals Re-Evaluation : Exercise Goals Re-Evaluation    Exercise Goal Re-Evaluation    Row Name 05/05/18 1444 05/11/18 1512 06/03/18 1230 06/17/18 1528 07/20/18 1723   Exercise Goals Review  Increase Physical Activity;Able to understand and use rate of perceived exertion (RPE) scale  Increase Physical Activity;Knowledge and understanding of Target Heart Rate Range (THRR);Able to understand and use rate of perceived exertion (RPE) scale;Understanding of Exercise Prescription;Increase Strength and Stamina;Able to check pulse independently  Increase Physical Activity;Knowledge and understanding of Target Heart Rate Range (THRR);Able to understand and use rate of perceived exertion (RPE) scale;Understanding of Exercise Prescription;Increase Strength and Stamina;Able to check pulse independently  Increase Physical Activity;Knowledge and understanding of Target Heart Rate Range (THRR);Able to understand and use rate of perceived exertion (RPE) scale;Understanding of Exercise Prescription;Increase Strength and Stamina;Able to check pulse independently  Increase Physical Activity;Knowledge and  understanding of Target Heart Rate Range (THRR);Able to understand and use rate of perceived exertion (RPE) scale;Understanding of Exercise Prescription;Increase Strength and Stamina;Able to check pulse independently   Comments  Pt demonstrated proper use of RPE scale and was able to exercise for 30 minutes without difficulty.   Reviewed home exercise with pt today.  Pt plans to walk or do silver sneakers for exercise, 2x/week in addition to coming to cardiac rehab.  Reviewed THR, pulse, RPE, sign and symptoms, NTG use,  and when to call 911 or MD.  Also discussed weather considerations and indoor options.  Pt voiced understanding.  Pt is active and walking at home 5x/week in addition to coming to cardiac rehab. Pt is able to walk a steady pace and gait for ~ one hour without difficulty.   Pt has been mowing the lawn,washing/waxing the car and walking approx. 1.5 miles 3x/week. Pt reported feeling great and having increased energy  Pt has met personal and program goals. Pt increased walking capacity from 6 minute walk test and is happy about his physical fitness levels.    Expected Outcomes  Pt will improve in cardiorespiratory fitness and musculoskeletal endurance.   Pt will improve in cardiorespiratory fitness and musculoskeletal endurance.   Pt will improve in cardiorespiratory fitness and musculoskeletal endurance.   Pt will improve in cardiorespiratory fitness and musculoskeletal endurance in order to perform household chores and functional activities.   Pt will continue to improve in cardiorespiratory fitness and musculoskeletal endurance to perfom activities at home and in the community.        Exercise Goal Re-Evaluation    Row Name 07/30/18 1546   Exercise Goals Review  Increase Physical Activity;Understanding of Exercise Prescription   Comments  Pt completed 36 sessions of Cardiac Rehab. Pt increased functional mobility and functional capacity by 55%. Pt decreased percent body fat by 28.9%. Spoke  with pt and wife to stress the importance of exercise not only for pt's heart health, but for his Parkinson's Disease.    Expected Outcomes  Pt will continue to walk for exercise 3 days a week for 20-30 minutes. Pt also plans to start kick boxing for Parkinson's. Pt's wife is excited about pt starting that new class.           Discharge Exercise Prescription (Final Exercise Prescription Changes): Exercise Prescription Changes - 07/24/18 1430    Response to Exercise          Blood Pressure (Admit)  110/70    Blood Pressure (Exercise)  136/60    Blood Pressure (Exit)  108/60    Heart Rate (Admit)  57 bpm    Heart Rate (Exercise)  87 bpm    Heart Rate (Exit)  57 bpm    Rating of Perceived Exertion (Exercise)  12    Perceived Dyspnea (Exercise)  0    Symptoms  None    Comments  Pt graduated from Cardiac Rehab    Duration  Continue with 30 min of aerobic exercise without signs/symptoms of physical distress.    Intensity  THRR unchanged        Progression          Progression  Continue to progress workloads to maintain intensity without signs/symptoms of physical distress.    Average METs  3.5        Resistance Training          Training Prescription  Yes    Weight  4lbs    Reps  10-15    Time  10 Minutes        Recumbant Bike          Level  3.5    Minutes  10    METs  2.8        NuStep          Level  5    SPM  85    Minutes  10    METs  3.9        Track  Laps  13    Minutes  10    METs  3.3        Home Exercise Plan          Plans to continue exercise at  Home (comment)   walking   Frequency  Add 2 additional days to program exercise sessions.    Initial Home Exercises Provided  05/11/18           Nutrition:  Target Goals: Understanding of nutrition guidelines, daily intake of sodium <1575m, cholesterol <2068m calories 30% from fat and 7% or less from saturated fats, daily to have 5 or more servings of fruits and  vegetables.  Biometrics: Pre Biometrics - 04/30/18 1145    Pre Biometrics          Height  '5\' 10"'  (1.778 m)    Weight  96.1 kg    Waist Circumference  42.75 inches    Hip Circumference  44 inches    Waist to Hip Ratio  0.97 %    BMI (Calculated)  30.4    Triceps Skinfold  17 mm    % Body Fat  28.9 %    Grip Strength  32 kg    Flexibility  7.5 in    Single Leg Stand  13 seconds          Post Biometrics - 07/13/18 1159     Post  Biometrics          Height  '5\' 10"'  (1.778 m)    Weight  82.4 kg    Waist Circumference  40.5 inches    Hip Circumference  41.25 inches    Waist to Hip Ratio  0.98 %    BMI (Calculated)  26.07    Triceps Skinfold  17 mm    % Body Fat  28.9 %    Grip Strength  42 kg    Flexibility  12.5 in    Single Leg Stand  6.41 seconds           Nutrition Therapy Plan and Nutrition Goals: Nutrition Therapy & Goals - 04/30/18 1157    Nutrition Therapy          Diet  Heart Healthy        Personal Nutrition Goals          Nutrition Goal  Pt to identify and limit food sources of trans fat and sodium        Intervention Plan          Intervention  Prescribe, educate and counsel regarding individualized specific dietary modifications aiming towards targeted core components such as weight, hypertension, lipid management, diabetes, heart failure and other comorbidities.    Expected Outcomes  Short Term Goal: Understand basic principles of dietary content, such as calories, fat, sodium, cholesterol and nutrients.;Long Term Goal: Adherence to prescribed nutrition plan.           Nutrition Assessments: Nutrition Assessments - 04/30/18 1157    MEDFICTS Scores          Pre Score  41           Nutrition Goals Re-Evaluation:   Nutrition Goals Re-Evaluation:   Nutrition Goals Discharge (Final Nutrition Goals Re-Evaluation):   Psychosocial: Target Goals: Acknowledge presence or absence of significant depression and/or stress, maximize coping  skills, provide positive support system. Participant is able to verbalize types and ability to use techniques and skills needed for reducing stress and depression.  Initial Review & Psychosocial Screening: Initial  Psych Review & Screening - 04/30/18 1223    Initial Review          Current issues with  None Identified        Family Dynamics          Good Support System?  Yes   Bill's wife is a source of support for him.  His wife was present at today's session.       Barriers          Psychosocial barriers to participate in program  There are no identifiable barriers or psychosocial needs.        Screening Interventions          Interventions  Encouraged to exercise           Quality of Life Scores: Quality of Life - 07/17/18 1112    Quality of Life          Select  Quality of Life        Quality of Life Scores          Health/Function Pre  20.27 %    Health/Function Post  27.1 %    Health/Function % Change  33.7 %    Socioeconomic Pre  27.21 %    Socioeconomic Post  29.17 %    Socioeconomic % Change   7.2 %    Psych/Spiritual Pre  24.86 %    Psych/Spiritual Post  30 %    Psych/Spiritual % Change  20.68 %    Family Pre  27 %    Family Post  28.8 %    Family % Change  6.67 %    GLOBAL Pre  23.53 %    GLOBAL Post  28.35 %    GLOBAL % Change  20.48 %          Scores of 19 and below usually indicate a poorer quality of life in these areas.  A difference of  2-3 points is a clinically meaningful difference.  A difference of 2-3 points in the total score of the Quality of Life Index has been associated with significant improvement in overall quality of life, self-image, physical symptoms, and general health in studies assessing change in quality of life.  PHQ-9: Recent Review Flowsheet Data    Depression screen [facility] 2/9 07/29/2018 05/04/2018   Decreased Interest 0 0   Down, Depressed, Hopeless 0 0   PHQ - 2 Score 0 0     Interpretation of Total Score  Total  Score Depression Severity:  1-4 = Minimal depression, 5-9 = Mild depression, 10-14 = Moderate depression, 15-19 = Moderately severe depression, 20-27 = Severe depression   Psychosocial Evaluation and Intervention: Psychosocial Evaluation - 07/29/18 1632    Discharge Psychosocial Assessment & Intervention          Comments  no psychosocial needs identified, no interventions necessary            Psychosocial Re-Evaluation: Psychosocial Re-Evaluation    Psychosocial Re-Evaluation    Row Name 05/07/18 1100 05/26/18 0749 07/01/18 1637   Current issues with  None Identified  None Identified  None Identified   Comments  no psychosocial needs identified, no interventions necessary  no psychosocial needs identified, no interventions necessary  no psychosocial needs identified, no interventions necessary   Expected Outcomes  pt will exhibit positive outlook with good coping skills.   pt will exhibit positive outlook with good coping skills.   pt will exhibit positive outlook with good coping skills.  Interventions  Encouraged to attend Cardiac Rehabilitation for the exercise  Encouraged to attend Cardiac Rehabilitation for the exercise  Encouraged to attend Cardiac Rehabilitation for the exercise   Continue Psychosocial Services   No Follow up required  No Follow up required  No Follow up required          Psychosocial Discharge (Final Psychosocial Re-Evaluation): Psychosocial Re-Evaluation - 07/01/18 1637    Psychosocial Re-Evaluation          Current issues with  None Identified    Comments  no psychosocial needs identified, no interventions necessary    Expected Outcomes  pt will exhibit positive outlook with good coping skills.     Interventions  Encouraged to attend Cardiac Rehabilitation for the exercise    Continue Psychosocial Services   No Follow up required           Vocational Rehabilitation: Provide vocational rehab assistance to qualifying candidates.   Vocational  Rehab Evaluation & Intervention: Vocational Rehab - 04/30/18 1222    Initial Vocational Rehab Evaluation & Intervention          Assessment shows need for Vocational Rehabilitation  No           Education: Education Goals: Education classes will be provided on a weekly basis, covering required topics. Participant will state understanding/return demonstration of topics presented.  Learning Barriers/Preferences: Learning Barriers/Preferences - 04/30/18 4270    Learning Barriers/Preferences          Learning Barriers  Sight    Learning Preferences  Skilled Demonstration;Verbal Instruction;Written Material;Video           Education Topics: Count Your Pulse:  -Group instruction provided by verbal instruction, demonstration, patient participation and written materials to support subject.  Instructors address importance of being able to find your pulse and how to count your pulse when at home without a heart monitor.  Patients get hands on experience counting their pulse with staff help and individually. Flowsheet Row CARDIAC REHAB PHASE II EXERCISE from 07/10/2018 in [place]  Date  05/22/18  Instruction Review Code  2- Demonstrated Understanding      Heart Attack, Angina, and Risk Factor Modification:  -Group instruction provided by verbal instruction, video, and written materials to support subject.  Instructors address signs and symptoms of angina and heart attacks.    Also discuss risk factors for heart disease and how to make changes to improve heart health risk factors. Flowsheet Row CARDIAC REHAB PHASE II EXERCISE from 07/10/2018 in [place]  Date  07/01/18  Educator  RN  Instruction Review Code  2- Demonstrated Understanding      Functional Fitness:  -Group instruction provided by verbal instruction, demonstration, patient participation, and written materials to support subject.  Instructors address safety  measures for doing things around the house.  Discuss how to get up and down off the floor, how to pick things up properly, how to safely get out of a chair without assistance, and balance training. Flowsheet Row CARDIAC REHAB PHASE II EXERCISE from 07/10/2018 in [place]  Date  07/10/18  Instruction Review Code  2- Demonstrated Understanding      Meditation and Mindfulness:  -Group instruction provided by verbal instruction, patient participation, and written materials to support subject.  Instructor addresses importance of mindfulness and meditation practice to help reduce stress and improve awareness.  Instructor also leads participants through a meditation exercise.  Olivia Lopez de Gutierrez  REHAB PHASE II EXERCISE from 07/10/2018 in [place]  Date  06/10/18  Educator  Jeanella Craze  Instruction Review Code  2- Demonstrated Understanding      Stretching for Flexibility and Mobility:  -Group instruction provided by verbal instruction, patient participation, and written materials to support subject.  Instructors lead participants through series of stretches that are designed to increase flexibility thus improving mobility.  These stretches are additional exercise for major muscle groups that are typically performed during regular warm up and cool down.   Hands Only CPR:  -Group verbal, video, and participation provides a basic overview of AHA guidelines for community CPR. Role-play of emergencies allow participants the opportunity to practice calling for help and chest compression technique with discussion of AED use.   Hypertension: -Group verbal and written instruction that provides a basic overview of hypertension including the most recent diagnostic guidelines, risk factor reduction with self-care instructions and medication management. Flowsheet Row CARDIAC REHAB PHASE II EXERCISE from 07/10/2018 in [place]  Date  06/12/18  Educator  RN  Instruction Review Code  2- Demonstrated Understanding       Nutrition I class: Heart Healthy Eating:  -Group instruction provided by PowerPoint slides, verbal discussion, and written materials to support subject matter. The instructor gives an explanation and review of the Therapeutic Lifestyle Changes diet recommendations, which includes a discussion on lipid goals, dietary fat, sodium, fiber, plant stanol/sterol esters, sugar, and the components of a well-balanced, healthy diet. Flowsheet Row CARDIAC REHAB PHASE II EXERCISE from 07/10/2018 in [place]  Date  04/30/18  Educator  RD      Nutrition II class: Lifestyle Skills:  -Group instruction provided by PowerPoint slides, verbal discussion, and written materials to support subject matter. The instructor gives an explanation and review of label reading, grocery shopping for heart health, heart healthy recipe modifications, and ways to make healthier choices when eating out.   Diabetes Question & Answer:  -Group instruction provided by PowerPoint slides, verbal discussion, and written materials to support subject matter. The instructor gives an explanation and review of diabetes co-morbidities, pre- and post-prandial blood glucose goals, pre-exercise blood glucose goals, signs, symptoms, and treatment of hypoglycemia and hyperglycemia, and foot care basics.   Diabetes Blitz:  -Group instruction provided by PowerPoint slides, verbal discussion, and written materials to support subject matter. The instructor gives an explanation and review of the physiology behind type 1 and type 2 diabetes, diabetes medications and rational behind using different medications, pre- and post-prandial blood glucose recommendations and Hemoglobin A1c goals, diabetes diet, and exercise including blood glucose guidelines for exercising safely.    Portion Distortion:  -Group  instruction provided by PowerPoint slides, verbal discussion, written materials, and food models to support subject matter. The instructor gives an explanation of serving size versus portion size, changes in portions sizes over the last 20 years, and what consists of a serving from each food group.   Stress Management:  -Group instruction provided by verbal instruction, video, and written materials to support subject matter.  Instructors review role of stress in heart disease and how to cope with stress positively.   Flowsheet Row CARDIAC REHAB PHASE II EXERCISE from 07/10/2018 in [place]  Date  06/17/18  Educator  RN  Instruction Review Code  2- Demonstrated Understanding      Exercising on Your Own:  -Group instruction provided by verbal instruction,  power point, and written materials to support subject.  Instructors discuss benefits of exercise, components of exercise, frequency and intensity of exercise, and end points for exercise.  Also discuss use of nitroglycerin and activating EMS.  Review options of places to exercise outside of rehab.  Review guidelines for sex with heart disease.   Cardiac Drugs I:  -Group instruction provided by verbal instruction and written materials to support subject.  Instructor reviews cardiac drug classes: antiplatelets, anticoagulants, beta blockers, and statins.  Instructor discusses reasons, side effects, and lifestyle considerations for each drug class. Flowsheet Row CARDIAC REHAB PHASE II EXERCISE from 07/10/2018 in [place]  Date  06/03/18  Instruction Review Code  2- Demonstrated Understanding      Cardiac Drugs II:  -Group instruction provided by verbal instruction and written materials to support subject.  Instructor reviews cardiac drug classes: angiotensin converting enzyme inhibitors (ACE-I), angiotensin II receptor blockers (ARBs), nitrates, and calcium channel blockers.   Instructor discusses reasons, side effects, and lifestyle considerations for each drug class. Flowsheet Row CARDIAC REHAB PHASE II EXERCISE from 07/10/2018 in [place]  Date  05/06/18  Instruction Review Code  2- Demonstrated Understanding      Anatomy and Physiology of the Circulatory System:  Group verbal and written instruction and models provide basic cardiac anatomy and physiology, with the coronary electrical and arterial systems. Review of: AMI, Angina, Valve disease, Heart Failure, Peripheral Artery Disease, Cardiac Arrhythmia, Pacemakers, and the ICD. Flowsheet Row CARDIAC REHAB PHASE II EXERCISE from 07/10/2018 in [place]  Date  06/24/18  Educator  RN  Instruction Review Code  2- Demonstrated Understanding      Other Education:  -Group or individual verbal, written, or video instructions that support the educational goals of the cardiac rehab program.   Holiday Eating Survival Tips:  -Group instruction provided by PowerPoint slides, verbal discussion, and written materials to support subject matter. The instructor gives patients tips, tricks, and techniques to help them not only survive but enjoy the holidays despite the onslaught of food that accompanies the holidays.   Knowledge Questionnaire Score: Knowledge Questionnaire Score - 07/17/18 1112    Knowledge Questionnaire Score          Post Score  20/24           Core Components/Risk Factors/Patient Goals at Admission: Personal Goals and Risk Factors at Admission - 04/30/18 1146    Core Components/Risk Factors/Patient Goals on Admission           Weight Management  Yes;Obesity    Intervention  Weight Management: Develop a combined nutrition and exercise program designed to reach desired caloric intake, while maintaining appropriate intake of nutrient and fiber, sodium and fats, and appropriate energy expenditure required for the weight goal.;Weight  Management: Provide education and appropriate resources to help participant work on and attain dietary goals.;Weight Management/Obesity: Establish reasonable short term and long term weight goals.    Admit Weight  211 lb 13.8 oz (96.1 kg)    Goal Weight: Short Term  200 lb (90.7 kg)    Goal Weight: Long Term  190 lb (86.2 kg)    Expected Outcomes  Short Term: Continue to assess and modify interventions until short term weight is achieved;Long Term: Adherence to nutrition and physical activity/exercise program aimed toward attainment of established weight goal;Weight Maintenance: Understanding of the daily nutrition guidelines, which includes 25-35% calories from fat, 7% or less cal from  saturated fats, less than 232m cholesterol, less than 1.5gm of sodium, & 5 or more servings of fruits and vegetables daily;Weight Loss: Understanding of general recommendations for a balanced deficit meal plan, which promotes 1-2 lb weight loss per week and includes a negative energy balance of [phone] kcal/d;Understanding recommendations for meals to include 15-35% energy as protein, 25-35% energy from fat, 35-60% energy from carbohydrates, less than 2068mof dietary cholesterol, 20-35 gm of total fiber daily;Understanding of distribution of calorie intake throughout the day with the consumption of 4-5 meals/snacks    Heart Failure  Yes    Intervention  Provide a combined exercise and nutrition program that is supplemented with education, support and counseling about heart failure. Directed toward relieving symptoms such as shortness of breath, decreased exercise tolerance, and extremity edema.    Expected Outcomes  Improve functional capacity of life;Short term: Attendance in program 2-3 days a week with increased exercise capacity. Reported lower sodium intake. Reported increased fruit and vegetable intake. Reports medication compliance.;Short term: Daily weights obtained and reported for increase. Utilizing diuretic  protocols set by physician.;Long term: Adoption of self-care skills and reduction of barriers for early signs and symptoms recognition and intervention leading to self-care maintenance.    Hypertension  Yes    Intervention  Provide education on lifestyle modifcations including regular physical activity/exercise, weight management, moderate sodium restriction and increased consumption of fresh fruit, vegetables, and low fat dairy, alcohol moderation, and smoking cessation.;Monitor prescription use compliance.    Expected Outcomes  Short Term: Continued assessment and intervention until BP is < 140/9063mG in hypertensive participants. < 130/63m22m in hypertensive participants with diabetes, heart failure or chronic kidney disease.;Long Term: Maintenance of blood pressure at goal levels.    Lipids  Yes    Intervention  Provide education and support for participant on nutrition & aerobic/resistive exercise along with prescribed medications to achieve LDL <70mg75mL >40mg.29mExpected Outcomes  Short Term: Participant states understanding of desired cholesterol values and is compliant with medications prescribed. Participant is following exercise prescription and nutrition guidelines.;Long Term: Cholesterol controlled with medications as prescribed, with individualized exercise RX and with personalized nutrition plan. Value goals: LDL < 70mg, 75m> 40 mg.           Core Components/Risk Factors/Patient Goals Review:  Goals and Risk Factor Review    Core Components/Risk Factors/Patient Goals Review    Row Name 05/04/18 1026 05/26/18 0749 07/01/18 1636 07/29/18 1632   Personal Goals Review  Weight Management/Obesity;Heart Failure;Hypertension;Lipids  Weight Management/Obesity;Heart Failure;Hypertension;Lipids  Weight Management/Obesity;Heart Failure;Hypertension;Lipids  Weight Management/Obesity;Heart Failure;Hypertension;Lipids   Review  pt with multiple CAD RF demonstrates willingness to participate in  CR exercise program. pt personal goal is lose weight to 180lb. pt encouraged to participate in CR exercise,nutrition and risk factor education classes.   pt with multiple CAD RF demonstrates willingness to participate in CR exercise program. pt personal goal is lose weight to 180lb. pt pleased with improving endurance.   pt with multiple CAD RF demonstrates willingness to participate in CR exercise program. pt personal goal is lose weight to 180lb. pt maintaining stable weight.  pt pleased with improving endurance.   pt with multiple CAD RF demonstrates willingness to participate in CR exercise program. pt maintaining stable weight.  pt pleased with improved endurance and exercise tolerance.  pt plans to walk at home and considering maintenance program participation.    Expected Outcomes  pt will participate in CR Exercise, nutrition and lifestyle modification  opportunities.   pt will participate in CR Exercise, nutrition and lifestyle modification opportunities.   pt will participate in CR Exercise, nutrition and lifestyle modification opportunities.   pt will participate in Exercise, nutrition and lifestyle modification opportunities in the community.          Core Components/Risk Factors/Patient Goals at Discharge (Final Review):  Goals and Risk Factor Review - 07/29/18 1632    Core Components/Risk Factors/Patient Goals Review          Personal Goals Review  Weight Management/Obesity;Heart Failure;Hypertension;Lipids    Review  pt with multiple CAD RF demonstrates willingness to participate in CR exercise program. pt maintaining stable weight.  pt pleased with improved endurance and exercise tolerance.  pt plans to walk at home and considering maintenance program participation.     Expected Outcomes  pt will participate in Exercise, nutrition and lifestyle modification opportunities in the community.           ITP Comments: ITP Comments    Row Name 04/30/18 0034 05/04/18 1024 05/07/18 1058  05/26/18 0748 07/29/18 1630   ITP Comments  Dr. Fransico Him, Medical Director  pt started group exercise session today.  pt tolerated light activity without difficulty.  pt oriented to exercise equipment and safety routine.    30 day ITP review.  pt demonstrates willingness to participate in group exercise program.  30 day ITP review.  pt demonstrates willingness to participate in group exercise program.  pt graduated from CR program with 36 sessions. pt with good attendance and participation      Comments:

## 2018-07-31 ENCOUNTER — Encounter (HOSPITAL_COMMUNITY): Payer: Medicare Other

## 2018-08-03 ENCOUNTER — Encounter (HOSPITAL_COMMUNITY): Payer: Medicare Other

## 2018-08-05 ENCOUNTER — Encounter (HOSPITAL_COMMUNITY): Payer: Medicare Other

## 2018-08-05 NOTE — Progress Notes (Signed)
Discharge Progress Report  Patient Details  Name: Calvin Ramirez MRN: [id number] Date of Birth: [birth year] Referring Provider:   Flowsheet Row CARDIAC REHAB PHASE II ORIENTATION from 04/30/2018 in [place]  Referring Provider  Daneen Schick MD       Number of Visits: 36   Reason for Discharge:  Patient has met program and personal goals.  Smoking History:  Social History   Tobacco Use  Smoking Status Never Smoker  Smokeless Tobacco Never Used    Diagnosis:  S/P CABG x 2  ADL [facility]:   Initial Exercise Prescription: Initial Exercise Prescription - 04/30/18 1100    Date of Initial Exercise RX and Referring Provider          Date  04/30/18    Referring Provider  Daneen Schick MD        Recumbant Bike          Level  1.5    Minutes  10    METs  1.5        NuStep          Level  2    SPM  60    Minutes  10    METs  1.8        Track          Laps  7    Minutes  10    METs  2.23        Prescription Details          Frequency (times per week)  3    Duration  Progress to 30 minutes of continuous aerobic without signs/symptoms of physical distress        Intensity          THRR 40-80% of Max Heartrate  57-114    Ratings of Perceived Exertion  11-13    Perceived Dyspnea  0-4        Progression          Progression  Continue to progress workloads to maintain intensity without signs/symptoms of physical distress.        Resistance Training          Training Prescription  Yes    Weight  2lbs    Reps  10-15           Discharge Exercise Prescription (Final Exercise Prescription Changes): Exercise Prescription Changes - 07/24/18 1430    Response to Exercise          Blood Pressure (Admit)  110/70    Blood Pressure (Exercise)  136/60    Blood Pressure (Exit)  108/60    Heart Rate (Admit)  57 bpm    Heart Rate (Exercise)  87 bpm    Heart Rate (Exit)  57 bpm    Rating of Perceived Exertion (Exercise)  12     Perceived Dyspnea (Exercise)  0    Symptoms  None    Comments  Pt graduated from Cardiac Rehab    Duration  Continue with 30 min of aerobic exercise without signs/symptoms of physical distress.    Intensity  THRR unchanged        Progression          Progression  Continue to progress workloads to maintain intensity without signs/symptoms of physical distress.    Average METs  3.5        Resistance Training          Training Prescription  Yes    Weight  4lbs    Reps  10-15    Time  10 Minutes        Recumbant Bike          Level  3.5    Minutes  10    METs  2.8        NuStep          Level  5    SPM  85    Minutes  10    METs  3.9        Track          Laps  13    Minutes  10    METs  3.3        Home Exercise Plan          Plans to continue exercise at  Home (comment)   walking   Frequency  Add 2 additional days to program exercise sessions.    Initial Home Exercises Provided  05/11/18           Functional Capacity: 6 Minute Walk    6 Minute Walk    Row Name 04/30/18 1130 04/30/18 1139 07/13/18 1146   Phase  Initial  no documentation  Discharge   Distance  no documentation  1056 feet  1638 feet   Distance % Change  no documentation  no documentation  55.11 %   Distance Feet Change  no documentation  no documentation  582 ft   Walk Time  no documentation  6 minutes  6 minutes   # of Rest Breaks  no documentation  0  0   MPH  no documentation  2  3.1   METS  no documentation  1.7  2.9   RPE  no documentation  11  13   VO2 Peak  no documentation  5.8  10.64   Symptoms  no documentation  Yes (comment)  No   Comments  no documentation  abdominal discomfort  no documentation   Resting HR  no documentation  63 bpm  60 bpm   Resting BP  no documentation  118/64  118/80   Resting Oxygen Saturation   no documentation  97 %  no documentation   Exercise Oxygen Saturation  during 6 min walk  no documentation  99 %  no documentation   Max Ex. HR  no  documentation  74 bpm  80 bpm   Max Ex. BP  no documentation  124/80  134/62   2 Minute Post BP  no documentation  118/64  no documentation       6 Minute Walk    Row Name 07/13/18 1641   2 Minute Post BP  104/62          Psychological, QOL, Others - Outcomes: PHQ 2/9: Depression screen [facility] 2/9 07/29/2018 05/04/2018  Decreased Interest 0 0  Down, Depressed, Hopeless 0 0  PHQ - 2 Score 0 0    Quality of Life: Quality of Life - 07/17/18 1112    Quality of Life          Select  Quality of Life        Quality of Life Scores          Health/Function Pre  20.27 %    Health/Function Post  27.1 %    Health/Function % Change  33.7 %    Socioeconomic Pre  27.21 %    Socioeconomic Post  29.17 %    Socioeconomic % Change  7.2 %    Psych/Spiritual Pre  24.86 %    Psych/Spiritual Post  30 %    Psych/Spiritual % Change  20.68 %    Family Pre  27 %    Family Post  28.8 %    Family % Change  6.67 %    GLOBAL Pre  23.53 %    GLOBAL Post  28.35 %    GLOBAL % Change  20.48 %           Personal Goals: Goals established at orientation with interventions provided to work toward goal. Personal Goals and Risk Factors at Admission - 04/30/18 1146    Core Components/Risk Factors/Patient Goals on Admission           Weight Management  Yes;Obesity    Intervention  Weight Management: Develop a combined nutrition and exercise program designed to reach desired caloric intake, while maintaining appropriate intake of nutrient and fiber, sodium and fats, and appropriate energy expenditure required for the weight goal.;Weight Management: Provide education and appropriate resources to help participant work on and attain dietary goals.;Weight Management/Obesity: Establish reasonable short term and long term weight goals.    Admit Weight  211 lb 13.8 oz (96.1 kg)    Goal Weight: Short Term  200 lb (90.7 kg)    Goal Weight: Long Term  190 lb (86.2 kg)    Expected Outcomes  Short Term: Continue to  assess and modify interventions until short term weight is achieved;Long Term: Adherence to nutrition and physical activity/exercise program aimed toward attainment of established weight goal;Weight Maintenance: Understanding of the daily nutrition guidelines, which includes 25-35% calories from fat, 7% or less cal from saturated fats, less than 257m cholesterol, less than 1.5gm of sodium, & 5 or more servings of fruits and vegetables daily;Weight Loss: Understanding of general recommendations for a balanced deficit meal plan, which promotes 1-2 lb weight loss per week and includes a negative energy balance of [phone] kcal/d;Understanding recommendations for meals to include 15-35% energy as protein, 25-35% energy from fat, 35-60% energy from carbohydrates, less than 2041mof dietary cholesterol, 20-35 gm of total fiber daily;Understanding of distribution of calorie intake throughout the day with the consumption of 4-5 meals/snacks    Heart Failure  Yes    Intervention  Provide a combined exercise and nutrition program that is supplemented with education, support and counseling about heart failure. Directed toward relieving symptoms such as shortness of breath, decreased exercise tolerance, and extremity edema.    Expected Outcomes  Improve functional capacity of life;Short term: Attendance in program 2-3 days a week with increased exercise capacity. Reported lower sodium intake. Reported increased fruit and vegetable intake. Reports medication compliance.;Short term: Daily weights obtained and reported for increase. Utilizing diuretic protocols set by physician.;Long term: Adoption of self-care skills and reduction of barriers for early signs and symptoms recognition and intervention leading to self-care maintenance.    Hypertension  Yes    Intervention  Provide education on lifestyle modifcations including regular physical activity/exercise, weight management, moderate sodium restriction and increased  consumption of fresh fruit, vegetables, and low fat dairy, alcohol moderation, and smoking cessation.;Monitor prescription use compliance.    Expected Outcomes  Short Term: Continued assessment and intervention until BP is < 140/9060mG in hypertensive participants. < 130/76m75m in hypertensive participants with diabetes, heart failure or chronic kidney disease.;Long Term: Maintenance of blood pressure at goal levels.    Lipids  Yes    Intervention  Provide education and support for  participant on nutrition & aerobic/resistive exercise along with prescribed medications to achieve LDL <73m, HDL >415m    Expected Outcomes  Short Term: Participant states understanding of desired cholesterol values and is compliant with medications prescribed. Participant is following exercise prescription and nutrition guidelines.;Long Term: Cholesterol controlled with medications as prescribed, with individualized exercise RX and with personalized nutrition plan. Value goals: LDL < 7022mHDL > 40 mg.            Personal Goals Discharge: Goals and Risk Factor Review    Core Components/Risk Factors/Patient Goals Review    Row Name 05/04/18 1026 05/26/18 0749 07/01/18 1636 07/29/18 1632   Personal Goals Review  Weight Management/Obesity;Heart Failure;Hypertension;Lipids  Weight Management/Obesity;Heart Failure;Hypertension;Lipids  Weight Management/Obesity;Heart Failure;Hypertension;Lipids  Weight Management/Obesity;Heart Failure;Hypertension;Lipids   Review  pt with multiple CAD RF demonstrates willingness to participate in CR exercise program. pt personal goal is lose weight to 180lb. pt encouraged to participate in CR exercise,nutrition and risk factor education classes.   pt with multiple CAD RF demonstrates willingness to participate in CR exercise program. pt personal goal is lose weight to 180lb. pt pleased with improving endurance.   pt with multiple CAD RF demonstrates willingness to participate in CR exercise  program. pt personal goal is lose weight to 180lb. pt maintaining stable weight.  pt pleased with improving endurance.   pt with multiple CAD RF demonstrates willingness to participate in CR exercise program. pt maintaining stable weight.  pt pleased with improved endurance and exercise tolerance.  pt plans to walk at home and considering maintenance program participation.    Expected Outcomes  pt will participate in CR Exercise, nutrition and lifestyle modification opportunities.   pt will participate in CR Exercise, nutrition and lifestyle modification opportunities.   pt will participate in CR Exercise, nutrition and lifestyle modification opportunities.   pt will participate in Exercise, nutrition and lifestyle modification opportunities in the community.          Exercise Goals and Review: Exercise Goals    Exercise Goals    Row Name 04/30/18 0816   Increase Physical Activity  Yes   Intervention  Provide advice, education, support and counseling about physical activity/exercise needs.;Develop an individualized exercise prescription for aerobic and resistive training based on initial evaluation findings, risk stratification, comorbidities and participant's personal goals.   Expected Outcomes  Short Term: Attend rehab on a regular basis to increase amount of physical activity.;Long Term: Exercising regularly at least 3-5 days a week.;Long Term: Add in home exercise to make exercise part of routine and to increase amount of physical activity.   Increase Strength and Stamina  Yes   Intervention  Provide advice, education, support and counseling about physical activity/exercise needs.;Develop an individualized exercise prescription for aerobic and resistive training based on initial evaluation findings, risk stratification, comorbidities and participant's personal goals.   Expected Outcomes  Short Term: Increase workloads from initial exercise prescription for resistance, speed, and METs.;Long Term:  Improve cardiorespiratory fitness, muscular endurance and strength as measured by increased METs and functional capacity (6MWT);Short Term: Perform resistance training exercises routinely during rehab and add in resistance training at home   Able to understand and use rate of perceived exertion (RPE) scale  Yes   Intervention  Provide education and explanation on how to use RPE scale   Expected Outcomes  Short Term: Able to use RPE daily in rehab to express subjective intensity level;Long Term:  Able to use RPE to guide intensity level when exercising independently  Knowledge and understanding of Target Heart Rate Range (THRR)  Yes   Intervention  Provide education and explanation of THRR including how the numbers were predicted and where they are located for reference   Expected Outcomes  Short Term: Able to state/look up THRR;Long Term: Able to use THRR to govern intensity when exercising independently;Short Term: Able to use daily as guideline for intensity in rehab   Able to check pulse independently  Yes   Intervention  Provide education and demonstration on how to check pulse in carotid and radial arteries.;Review the importance of being able to check your own pulse for safety during independent exercise   Expected Outcomes  Short Term: Able to explain why pulse checking is important during independent exercise;Long Term: Able to check pulse independently and accurately   Understanding of Exercise Prescription  Yes   Intervention  Provide education, explanation, and written materials on patient's individual exercise prescription   Expected Outcomes  Long Term: Able to explain home exercise prescription to exercise independently          Nutrition & Weight - Outcomes: Pre Biometrics - 04/30/18 1145    Pre Biometrics          Height  '5\' 10"'  (1.778 m)    Weight  96.1 kg    Waist Circumference  42.75 inches    Hip Circumference  44 inches    Waist to Hip Ratio  0.97 %    BMI  (Calculated)  30.4    Triceps Skinfold  17 mm    % Body Fat  28.9 %    Grip Strength  32 kg    Flexibility  7.5 in    Single Leg Stand  13 seconds          Post Biometrics - 07/13/18 1159     Post  Biometrics          Height  '5\' 10"'  (1.778 m)    Weight  82.4 kg    Waist Circumference  40.5 inches    Hip Circumference  41.25 inches    Waist to Hip Ratio  0.98 %    BMI (Calculated)  26.07    Triceps Skinfold  17 mm    % Body Fat  28.9 %    Grip Strength  42 kg    Flexibility  12.5 in    Single Leg Stand  6.41 seconds           Nutrition: Nutrition Therapy & Goals - 04/30/18 1157    Nutrition Therapy          Diet  Heart Healthy        Personal Nutrition Goals          Nutrition Goal  Pt to identify and limit food sources of trans fat and sodium        Intervention Plan          Intervention  Prescribe, educate and counsel regarding individualized specific dietary modifications aiming towards targeted core components such as weight, hypertension, lipid management, diabetes, heart failure and other comorbidities.    Expected Outcomes  Short Term Goal: Understand basic principles of dietary content, such as calories, fat, sodium, cholesterol and nutrients.;Long Term Goal: Adherence to prescribed nutrition plan.           Nutrition Discharge: Nutrition Assessments - 07/30/18 1728    MEDFICTS Scores          Pre Score  41    Post Score  --  did not return survey          Education Questionnaire Score: Knowledge Questionnaire Score - 07/17/18 1112    Knowledge Questionnaire Score          Post Score  20/24           Goals reviewed with patient; copy given to patient.

## 2018-08-21 ENCOUNTER — Telehealth: Payer: Self-pay | Admitting: Interventional Cardiology

## 2018-08-21 ENCOUNTER — Encounter: Payer: Self-pay | Admitting: *Deleted

## 2018-08-21 NOTE — Telephone Encounter (Signed)
Spoke with pt's wife, DPR on file.  Made her aware that letter was ready.  She will come by Monday to pick up.

## 2018-08-21 NOTE — Telephone Encounter (Signed)
New Message        Patient's wife called today states patient needs a letter stating that he is ok to exercise. He is joining the [employer] and wants to do other exercise  Programs. Pls call and advise.

## 2018-08-21 NOTE — Telephone Encounter (Signed)
Okay to exercise.  No restrictions but activity should be aerobic/isotonic with low resistance training.

## 2018-08-21 NOTE — Telephone Encounter (Signed)
Will route to Dr. Tamala Julian for approval on exercise.  Ok to write a letter?  Any restrictions?

## 2018-09-01 DIAGNOSIS — E538 Deficiency of other specified B group vitamins: Secondary | ICD-10-CM | POA: Diagnosis not present

## 2018-09-05 DIAGNOSIS — Z23 Encounter for immunization: Secondary | ICD-10-CM | POA: Diagnosis not present

## 2018-09-24 DIAGNOSIS — R453 Demoralization and apathy: Secondary | ICD-10-CM | POA: Diagnosis not present

## 2018-09-24 DIAGNOSIS — G4752 REM sleep behavior disorder: Secondary | ICD-10-CM | POA: Diagnosis not present

## 2018-09-24 DIAGNOSIS — G20 Parkinson's disease: Secondary | ICD-10-CM | POA: Diagnosis not present

## 2018-09-24 DIAGNOSIS — R413 Other amnesia: Secondary | ICD-10-CM | POA: Diagnosis not present

## 2018-09-24 DIAGNOSIS — Z79899 Other long term (current) drug therapy: Secondary | ICD-10-CM | POA: Diagnosis not present

## 2018-11-12 DIAGNOSIS — E538 Deficiency of other specified B group vitamins: Secondary | ICD-10-CM | POA: Diagnosis not present

## 2018-11-25 NOTE — Progress Notes (Signed)
Cardiology Office Note:    Date:  11/27/2018   ID:  Calvin Ramirez, DOB March 23, 1941, MRN [id number]  PCP:  Shirline Frees, MD  Cardiologist:  Sinclair Grooms, MD   Referring MD: Shirline Frees, MD   Chief Complaint  Patient presents with  . Coronary Artery Disease    History of Present Illness:    Calvin Ramirez is a 78 y.o. male with a hx of history of CAD, recent CABG with LIMA to LAD and SVG to OM for left main disease, hyperlipidemia, Parkinson's disease, postop atrial fibrillation, essential hypertension, and diastolic heart failure in the setting of acute ischemia caused by left main disease. Recent acute on chronic combined systolic and diastolic HF treated with diuretics.  He and his wife are present.  The wife does much of the talking.  He is concerned that a cholesterol level is not been recently performed.  Last LDL C was 80 in July.  He is done with cardiac rehab.  Not as active as during rehab.  No chest discomfort, dyspnea, palpitations, or other significant complaints.  He denies angina.  He has not had syncope.  He recalls one episode of dizziness several weeks ago that had components consistent with vertigo.  Wife is concerned about fits of anger.  Getting lost in his neighborhood.  Decreased memory.  She feels that the heart attack because this change in personality.  She wonders if medications are contributing.  Past Medical History:  Diagnosis Date  . Acute diastolic (congestive) heart failure ([place]) 02/13/2018  . Arthritis   . 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 CAD (coronary artery disease) 02/24/2018  . CAD in native artery 04/26/2015   2011 Multivessel DES PCI LAD,RCA PLOM and distal RCA   . Coronary artery disease   . Dementia ([place])   . Essential hypertension 04/21/2014  . Hyperlipidemia   . Parkinson's disease ([place]) 04/27/2015   2016   . Paroxysmal atrial fibrillation ([place]) 03/17/2018  . 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 Stented coronary artery 2011   x3    Past Surgical History:  Procedure Laterality Date  . APPENDECTOMY    . CARDIAC CATHETERIZATION  2011   3 stents  . COLONOSCOPY    . CORONARY ARTERY BYPASS GRAFT N/A 02/25/2018   Procedure: CORONARY ARTERY BYPASS GRAFTING (CABG), ON PUMP, TIMES TWO, USING LEFT INTERNAL MAMMARY ARTERY AND ENDOSCOPICALLY HARVESTED RIGHT GREATER SAPHENOUS VEIN;  Surgeon: Rexene Alberts, MD;  Location: [facility];  Service: Open Heart Surgery;  Laterality: N/A;  . RIGHT/LEFT HEART CATH AND CORONARY ANGIOGRAPHY N/A 02/24/2018   Procedure: RIGHT/LEFT HEART CATH AND CORONARY ANGIOGRAPHY;  Surgeon: Belva Crome, MD;  Location: [facility];  Service: Cardiovascular;  Laterality: N/A;  . TEE WITHOUT CARDIOVERSION N/A 02/25/2018   Procedure: TRANSESOPHAGEAL ECHOCARDIOGRAM (TEE);  Surgeon: Rexene Alberts, MD;  Location: [facility];  Service: Open Heart Surgery;  Laterality: N/A;  . TONSILLECTOMY      Current Medications: Current Meds  Medication Sig  . aspirin EC 325 MG tablet Take 325 mg by mouth daily.  . furosemide (LASIX) 40 MG tablet Take 1 tablet (40 mg total) by mouth daily.  Marland Kitchen losartan (COZAAR) 50 MG tablet Take 1 tablet (50 mg total) by mouth daily.  . Melatonin 5 MG CAPS Take 1 capsule by mouth as needed (sleep).  . metoprolol tartrate (LOPRESSOR) 25 MG tablet  Take 0.5 tablets (12.5 mg total) by mouth 2 (two) times daily.  . rosuvastatin (CRESTOR) 20 MG tablet Take 1 tablet (20 mg total) by mouth daily.     Allergies:   Levaquin [levofloxacin hemihydrate] and Penicillins   Social History   Socioeconomic History  . Marital status: Married    Spouse name: Not on file  . Number of children: Not on file  . Years of education: Not on file  . Highest education level: Not on file  Occupational History  . Not on file  Social Needs  . Financial resource strain: Not on file  . Food insecurity:    Worry: Not on file    Inability: Not on  file  . Transportation needs:    Medical: Not on file    Non-medical: Not on file  Tobacco Use  . Smoking status: Never Smoker  . Smokeless tobacco: Never Used  Substance and Sexual Activity  . Alcohol use: Yes    Alcohol/week: 0.0 standard drinks    Comment: occ  . Drug use: No  . Sexual activity: Not on file  Lifestyle  . Physical activity:    Days per week: Not on file    Minutes per session: Not on file  . Stress: Not on file  Relationships  . Social connections:    Talks on phone: Not on file    Gets together: Not on file    Attends religious service: Not on file    Active member of club or organization: Not on file    Attends meetings of clubs or organizations: Not on file    Relationship status: Not on file  Other Topics Concern  . Not on file  Social History Narrative  . Not on file     Family History: The patient's family history includes Cancer in his father; Heart attack in his mother.  ROS:   Please see the history of present illness.    Constipation has been an issue.  Some arthritis in the knees.  Wife is concerned about changes in personality and memory.  Otherwise okay.  All other systems reviewed and are negative.  EKGs/Labs/Other Studies Reviewed:    The following studies were reviewed today: No new or recent studies.  EKG:  EKG is not repeated today.  Recent Labs: 02/26/2018: Magnesium 2.2 04/24/2018: Hemoglobin 11.1; NT-Pro BNP 3,656; Platelets 228 05/22/2018: BUN 23; Creatinine, Ser 1.12; Potassium 4.5; Sodium 143 06/09/2018: ALT 17  Recent Lipid Panel    Component Value Date/Time   CHOL 157 06/09/2018 0801   TRIG 195 (H) 06/09/2018 0801   HDL 37 (L) 06/09/2018 0801   CHOLHDL 4.2 06/09/2018 0801   CHOLHDL 6.2 (H) 09/06/2016 0952   VLDL 57 (H) 09/06/2016 0952   LDLCALC 81 06/09/2018 0801   LDLDIRECT 98.0 04/21/2015 0858    Physical Exam:    VS:  BP 116/62   Pulse (!) 56   Ht 5\' 10"  (1.778 m)   Wt 205 lb 6.4 oz (93.2 kg)   SpO2 96%    BMI 29.47 kg/m     Wt Readings from Last 3 Encounters:  11/27/18 205 lb 6.4 oz (93.2 kg)  07/13/18 181 lb 10.5 oz (82.4 kg)  05/22/18 201 lb 3.2 oz (91.3 kg)     GEN: . No acute distress HEENT: Normal NECK: No JVD. LYMPHATICS: No lymphadenopathy CARDIAC: RRR.  No murmur, gallop, edema VASCULAR: Pulses 2+ radial and carotid, Bruits are not heard in carotids or femoral RESPIRATORY:  Clear to auscultation without rales, wheezing or rhonchi  ABDOMEN: Soft, non-tender, non-distended, No pulsatile mass, MUSCULOSKELETAL: No deformity  SKIN: Warm and dry NEUROLOGIC:  Alert and oriented x 3 PSYCHIATRIC:  Normal affect   ASSESSMENT:    1. S/P CABG x 2   2. Paroxysmal atrial fibrillation (HCC)   3. Acute on chronic diastolic heart failure ([place])   4. Essential hypertension   5. Hyperlipidemia, unspecified hyperlipidemia type   6. Change in personality    PLAN:    In order of problems listed above:  1. Doing well from cardiac standpoint.  Aggressive secondary risk prevention is rediscussed.  Falling short on physical activity.  Strongly encouraged 150 minutes of moderate aerobic activity per week. 2. No clinical recurrence.  Continue low-dose beta-blocker therapy. 3. Volume status is stable.  Continue current medication regimen.  We will check a comprehensive metabolic panel today to assess renal function and liver function 4. Excellent blood pressure control.  Target 130/80 mmHg or less.  Low-sodium diet is reviewed. 5. Lipid panel is pending.  Will push LDL-C to less than 70.  May need to have an increase in rosuvastatin intensity. 6. Strongly recommended earlier appointment to see neurologist and to discuss the change in personality and progressive memory loss.  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  Plan is aggressive secondary risk prevention: Overall education and awareness concerning primary/secondary risk prevention was discussed in detail: LDL less than 70, hemoglobin A1c less than  7, blood pressure target less than 130/80 mmHg, >150 minutes of moderate aerobic activity per week, avoidance of smoking, weight control (via diet and exercise), and continued surveillance/management of/for obstructive sleep apnea.  Clinical follow-up 9 to 12 months.  Medication Adjustments/Labs and Tests Ordered: Current medicines are reviewed at length with the patient today.  Concerns regarding medicines are outlined above.  Orders Placed This Encounter  Procedures  . Lipid panel  . Hepatic function panel  . Basic metabolic panel   No orders of the defined types were placed in this encounter.   Patient Instructions  Medication Instructions:  1) In April 2020, you can decrease Aspirin to 81mg  once daily  If you need a refill on your cardiac medications before your next appointment, please call your pharmacy.   Lab work: Lipid, liver and BMET today  If you have labs (blood work) drawn today and your tests are completely normal, you will receive your results only by: Marland Kitchen MyChart Message (if you have MyChart) OR . A paper copy in the mail If you have any lab test that is abnormal or we need to change your treatment, we will call you to review the results.  Testing/Procedures: None  Follow-Up: At [facility], you and your health needs are our priority.  As part of our continuing mission to provide you with exceptional heart care, we have created designated Provider Care Teams.  These Care Teams include your primary Cardiologist (physician) and Advanced Practice Providers (APPs -  Physician Assistants and Nurse Practitioners) who all work together to provide you with the care you need, when you need it. You will need a follow up appointment in 9-12 months.  Please call our office 2 months in advance to schedule this appointment.  You may see Sinclair Grooms, MD or one of the following Advanced Practice Providers on your designated Care Team:   Truitt Merle, NP Cecilie Kicks,  NP . Kathyrn Drown, NP  Any Other Special Instructions Will Be Listed Below (If Applicable).  Dr.  Tamala Julian recommends that you get 150 minutes a week of moderate aerobic activity.  Dr. Tamala Julian recommends that you reach out to your Neurologist at [place], Sinclair Grooms, MD  11/27/2018 8:35 AM    [place]

## 2018-11-27 ENCOUNTER — Encounter: Payer: Self-pay | Admitting: Interventional Cardiology

## 2018-11-27 ENCOUNTER — Ambulatory Visit (INDEPENDENT_AMBULATORY_CARE_PROVIDER_SITE_OTHER): Payer: Medicare Other | Admitting: Interventional Cardiology

## 2018-11-27 VITALS — BP 116/62 | HR 56 | Ht 70.0 in | Wt 205.4 lb

## 2018-11-27 DIAGNOSIS — F688 Other specified disorders of adult personality and behavior: Secondary | ICD-10-CM | POA: Diagnosis not present

## 2018-11-27 DIAGNOSIS — I5033 Acute on chronic diastolic (congestive) heart failure: Secondary | ICD-10-CM | POA: Diagnosis not present

## 2018-11-27 DIAGNOSIS — E785 Hyperlipidemia, unspecified: Secondary | ICD-10-CM | POA: Diagnosis not present

## 2018-11-27 DIAGNOSIS — I480 Paroxysmal atrial fibrillation: Secondary | ICD-10-CM

## 2018-11-27 DIAGNOSIS — I10 Essential (primary) hypertension: Secondary | ICD-10-CM

## 2018-11-27 DIAGNOSIS — Z951 Presence of aortocoronary bypass graft: Secondary | ICD-10-CM

## 2018-11-27 LAB — BASIC METABOLIC PANEL
BUN/Creatinine Ratio: 19 (ref 10–24)
BUN: 24 mg/dL (ref 8–27)
CO2: 23 mmol/L (ref 20–29)
Calcium: 9.9 mg/dL (ref 8.6–10.2)
Chloride: 99 mmol/L (ref 96–106)
Creatinine, Ser: 1.26 mg/dL (ref 0.76–1.27)
GFR calc Af Amer: 63 mL/min/{1.73_m2} (ref >59)
GFR calc non Af Amer: 54 mL/min/{1.73_m2} — ABNORMAL LOW (ref >59)
Glucose: 104 mg/dL — ABNORMAL HIGH (ref 65–99)
Potassium: 4.3 mmol/L (ref 3.5–5.2)
Sodium: 141 mmol/L (ref 134–144)

## 2018-11-27 LAB — LIPID PANEL
Chol/HDL Ratio: 4.1 ratio (ref 0.0–5.0)
Cholesterol, Total: 161 mg/dL (ref 100–199)
HDL: 39 mg/dL — ABNORMAL LOW (ref >39)
LDL Calculated: 80 mg/dL (ref 0–99)
Triglycerides: 212 mg/dL — ABNORMAL HIGH (ref 0–149)
VLDL Cholesterol Cal: 42 mg/dL — ABNORMAL HIGH (ref 5–40)

## 2018-11-27 LAB — HEPATIC FUNCTION PANEL
ALT: 24 IU/L (ref 0–44)
AST: 25 IU/L (ref 0–40)
Albumin: 4.7 g/dL (ref 3.5–4.8)
Alkaline Phosphatase: 62 IU/L (ref 39–117)
Bilirubin Total: 0.6 mg/dL (ref 0.0–1.2)
Bilirubin, Direct: 0.15 mg/dL (ref 0.00–0.40)
Total Protein: 7.1 g/dL (ref 6.0–8.5)

## 2018-11-27 NOTE — Patient Instructions (Signed)
Medication Instructions:  1) In April 2020, you can decrease Aspirin to 81mg  once daily  If you need a refill on your cardiac medications before your next appointment, please call your pharmacy.   Lab work: Lipid, liver and BMET today  If you have labs (blood work) drawn today and your tests are completely normal, you will receive your results only by: Marland Kitchen MyChart Message (if you have MyChart) OR . A paper copy in the mail If you have any lab test that is abnormal or we need to change your treatment, we will call you to review the results.  Testing/Procedures: None  Follow-Up: At [facility], you and your health needs are our priority.  As part of our continuing mission to provide you with exceptional heart care, we have created designated Provider Care Teams.  These Care Teams include your primary Cardiologist (physician) and Advanced Practice Providers (APPs -  Physician Assistants and Nurse Practitioners) who all work together to provide you with the care you need, when you need it. You will need a follow up appointment in 9-12 months.  Please call our office 2 months in advance to schedule this appointment.  You may see Sinclair Grooms, MD or one of the following Advanced Practice Providers on your designated Care Team:   Truitt Merle, NP Cecilie Kicks, NP . Kathyrn Drown, NP  Any Other Special Instructions Will Be Listed Below (If Applicable).  Dr. Tamala Julian recommends that you get 150 minutes a week of moderate aerobic activity.  Dr. Tamala Julian recommends that you reach out to your Neurologist at [facility].

## 2018-11-30 ENCOUNTER — Telehealth: Payer: Self-pay | Admitting: Interventional Cardiology

## 2018-11-30 DIAGNOSIS — E785 Hyperlipidemia, unspecified: Secondary | ICD-10-CM

## 2018-11-30 NOTE — Telephone Encounter (Signed)
Spoke with pt and went over results and recommendations per Dr. Tamala Julian.  Pt would like to speak to his wife prior to starting the Vasce[place].  He will call once he speaks to her and let me know his final decision.  Pt appreciative for call.

## 2018-11-30 NOTE — Telephone Encounter (Signed)
New Message   Patient is returning call in reference to lab results. Please call to discuss.  

## 2018-12-14 DIAGNOSIS — E538 Deficiency of other specified B group vitamins: Secondary | ICD-10-CM | POA: Diagnosis not present

## 2018-12-15 NOTE — Telephone Encounter (Signed)
Follow up   Pt is returning the phone call from 2 weeks ago about his triglyceride level  Please call

## 2018-12-15 NOTE — Telephone Encounter (Signed)
Pt called back and states that he would like to adjust his diet and increase activity before adding another medication. He states that he has already adjusted his diet and has lost 2lbs. States he had been eating a lot of sweets but he has cut that out now. Pt would like to work on this for 6 months and recheck labs if Dr. Tamala Julian is agreeable. Advised I would call back once I get his recommendations.  See result note for further information

## 2018-12-15 NOTE — Addendum Note (Signed)
Addended by: Loren Racer on: 12/15/2018 03:34 PM   Modules accepted: Orders

## 2018-12-22 ENCOUNTER — Telehealth: Payer: Self-pay | Admitting: Interventional Cardiology

## 2018-12-22 NOTE — Telephone Encounter (Signed)
  Patient states he is returning a call from Maude Leriche

## 2018-12-22 NOTE — Telephone Encounter (Signed)
Spoke with wife and she states pt was actually calling about some issues she has been having.  Will place note in her chart

## 2018-12-29 DIAGNOSIS — G20 Parkinson's disease: Secondary | ICD-10-CM | POA: Diagnosis not present

## 2018-12-29 DIAGNOSIS — G3184 Mild cognitive impairment, so stated: Secondary | ICD-10-CM | POA: Diagnosis not present

## 2018-12-29 DIAGNOSIS — F688 Other specified disorders of adult personality and behavior: Secondary | ICD-10-CM | POA: Diagnosis not present

## 2019-01-02 ENCOUNTER — Other Ambulatory Visit: Payer: Self-pay | Admitting: Interventional Cardiology

## 2019-01-07 ENCOUNTER — Telehealth: Payer: Self-pay | Admitting: Interventional Cardiology

## 2019-01-07 NOTE — Telephone Encounter (Signed)
New Message   Pt c/o medication issue:  1. Name of Medication: metoprolol tartrate (LOPRESSOR) 25 MG tablet     2. How are you currently taking this medication (dosage and times per day)? Take 0.5 tablets (12.5 mg total) by mouth 2 (two) times daily.  3. Are you having a reaction (difficulty breathing--STAT)?   4. What is your medication issue? Patients wife is calling on his behalf. She is wanting to know if he should still be taking this medication.

## 2019-01-07 NOTE — Telephone Encounter (Signed)
Left message to call back  

## 2019-01-07 NOTE — Telephone Encounter (Signed)
Spoke with wife and made her aware that pt does need to be taking the Metoprolol.  Wife verbalized understanding and was appreciative for call.

## 2019-01-14 DIAGNOSIS — H0220C Unspecified lagophthalmos, bilateral, upper and lower eyelids: Secondary | ICD-10-CM | POA: Diagnosis not present

## 2019-01-14 DIAGNOSIS — H43393 Other vitreous opacities, bilateral: Secondary | ICD-10-CM | POA: Diagnosis not present

## 2019-01-14 DIAGNOSIS — G20 Parkinson's disease: Secondary | ICD-10-CM | POA: Diagnosis not present

## 2019-01-14 DIAGNOSIS — Z961 Presence of intraocular lens: Secondary | ICD-10-CM | POA: Diagnosis not present

## 2019-01-14 DIAGNOSIS — H524 Presbyopia: Secondary | ICD-10-CM | POA: Diagnosis not present

## 2019-01-14 DIAGNOSIS — H43813 Vitreous degeneration, bilateral: Secondary | ICD-10-CM | POA: Diagnosis not present

## 2019-01-14 DIAGNOSIS — H5201 Hypermetropia, right eye: Secondary | ICD-10-CM | POA: Diagnosis not present

## 2019-01-15 DIAGNOSIS — E538 Deficiency of other specified B group vitamins: Secondary | ICD-10-CM | POA: Diagnosis not present

## 2019-03-04 ENCOUNTER — Other Ambulatory Visit: Payer: Self-pay | Admitting: *Deleted

## 2019-03-04 MED ORDER — ASPIRIN EC 81 MG PO TBEC: 81.0000 mg | DELAYED_RELEASE_TABLET | Freq: Every day | ORAL | 3 refills | Status: DC

## 2019-04-03 ENCOUNTER — Other Ambulatory Visit: Payer: Self-pay | Admitting: Interventional Cardiology

## 2019-04-05 ENCOUNTER — Ambulatory Visit: Payer: Medicare Other | Admitting: Thoracic Surgery (Cardiothoracic Vascular Surgery)

## 2019-04-12 ENCOUNTER — Ambulatory Visit: Payer: Medicare Other | Admitting: Thoracic Surgery (Cardiothoracic Vascular Surgery)

## 2019-05-04 DIAGNOSIS — G20 Parkinson's disease: Secondary | ICD-10-CM | POA: Diagnosis not present

## 2019-05-04 DIAGNOSIS — F0390 Unspecified dementia without behavioral disturbance: Secondary | ICD-10-CM | POA: Diagnosis not present

## 2019-06-04 ENCOUNTER — Other Ambulatory Visit: Payer: Medicare Other | Admitting: *Deleted

## 2019-06-04 ENCOUNTER — Other Ambulatory Visit: Payer: Self-pay

## 2019-06-04 DIAGNOSIS — E785 Hyperlipidemia, unspecified: Secondary | ICD-10-CM

## 2019-06-04 LAB — LIPID PANEL
Chol/HDL Ratio: 3.9 ratio (ref 0.0–5.0)
Cholesterol, Total: 147 mg/dL (ref 100–199)
HDL: 38 mg/dL — ABNORMAL LOW (ref >39)
LDL Calculated: 85 mg/dL (ref 0–99)
Triglycerides: 122 mg/dL (ref 0–149)
VLDL Cholesterol Cal: 24 mg/dL (ref 5–40)

## 2019-06-04 LAB — HEPATIC FUNCTION PANEL
ALT: 31 IU/L (ref 0–44)
AST: 34 IU/L (ref 0–40)
Albumin: 4.6 g/dL (ref 3.7–4.7)
Alkaline Phosphatase: 64 IU/L (ref 39–117)
Bilirubin Total: 0.8 mg/dL (ref 0.0–1.2)
Bilirubin, Direct: 0.23 mg/dL (ref 0.00–0.40)
Total Protein: 6.6 g/dL (ref 6.0–8.5)

## 2019-06-09 ENCOUNTER — Other Ambulatory Visit: Payer: Self-pay | Admitting: Interventional Cardiology

## 2019-08-01 DIAGNOSIS — Z23 Encounter for immunization: Secondary | ICD-10-CM | POA: Diagnosis not present

## 2019-08-10 DIAGNOSIS — D1801 Hemangioma of skin and subcutaneous tissue: Secondary | ICD-10-CM | POA: Diagnosis not present

## 2019-08-10 DIAGNOSIS — L821 Other seborrheic keratosis: Secondary | ICD-10-CM | POA: Diagnosis not present

## 2019-08-10 DIAGNOSIS — L570 Actinic keratosis: Secondary | ICD-10-CM | POA: Diagnosis not present

## 2019-08-10 DIAGNOSIS — D225 Melanocytic nevi of trunk: Secondary | ICD-10-CM | POA: Diagnosis not present

## 2019-08-10 DIAGNOSIS — D2272 Melanocytic nevi of left lower limb, including hip: Secondary | ICD-10-CM | POA: Diagnosis not present

## 2019-08-10 DIAGNOSIS — L814 Other melanin hyperpigmentation: Secondary | ICD-10-CM | POA: Diagnosis not present

## 2019-08-10 DIAGNOSIS — D2372 Other benign neoplasm of skin of left lower limb, including hip: Secondary | ICD-10-CM | POA: Diagnosis not present

## 2019-09-03 DIAGNOSIS — Z23 Encounter for immunization: Secondary | ICD-10-CM | POA: Diagnosis not present

## 2019-09-27 NOTE — Progress Notes (Signed)
Cardiology Office Note:    Date:  09/29/2019   ID:  Calvin Ramirez, DOB [birth year], MRN [id number]  PCP:  Shirline Frees, MD  Cardiologist:  Sinclair Grooms, MD   Referring MD: Shirline Frees, MD   Chief Complaint  Patient presents with  . Coronary Artery Disease  . Hypertension    History of Present Illness:    Calvin Ramirez is a 78 y.o. male with a hx of CAD, recent CABG with LIMA to LAD and SVG to OM for left main disease (April 2018), hyperlipidemia, Parkinson's disease, postop atrial fibrillation, essential hypertension, and diastolic heart failure in the setting of acute ischemia caused by left main disease.Recent acute on chronic combined systolic and diastolic HF treated with diuretics.  Calvin Ramirez is now 1.5 years post coronary bypass surgery.  He is walking 2 miles every day.  He still has some sternal musculoskeletal discomfort.  He denies orthopnea, PND, chest pain, ankle edema, difficulty with his medications.  He has not had palpitations.  Past Medical History:  Diagnosis Date  . Acute diastolic (congestive) heart failure ([place]) 02/13/2018  . Arthritis   . 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 CAD (coronary artery disease) 02/24/2018  . CAD in native artery 04/26/2015   2011 Multivessel DES PCI LAD,RCA PLOM and distal RCA   . Coronary artery disease   . Dementia ([place])   . Essential hypertension 04/21/2014  . Hyperlipidemia   . Parkinson's disease ([place]) 04/27/2015   2016   . Paroxysmal atrial fibrillation ([place]) 03/17/2018  . 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 Stented coronary artery 2011   x3    Past Surgical History:  Procedure Laterality Date  . APPENDECTOMY    . CARDIAC CATHETERIZATION  2011   3 stents  . COLONOSCOPY    . CORONARY ARTERY BYPASS GRAFT N/A 02/25/2018   Procedure: CORONARY ARTERY BYPASS GRAFTING (CABG), ON PUMP, TIMES TWO, USING LEFT INTERNAL MAMMARY ARTERY AND ENDOSCOPICALLY HARVESTED  RIGHT GREATER SAPHENOUS VEIN;  Surgeon: Rexene Alberts, MD;  Location: [facility];  Service: Open Heart Surgery;  Laterality: N/A;  . RIGHT/LEFT HEART CATH AND CORONARY ANGIOGRAPHY N/A 02/24/2018   Procedure: RIGHT/LEFT HEART CATH AND CORONARY ANGIOGRAPHY;  Surgeon: Belva Crome, MD;  Location: [facility];  Service: Cardiovascular;  Laterality: N/A;  . TEE WITHOUT CARDIOVERSION N/A 02/25/2018   Procedure: TRANSESOPHAGEAL ECHOCARDIOGRAM (TEE);  Surgeon: Rexene Alberts, MD;  Location: [facility];  Service: Open Heart Surgery;  Laterality: N/A;  . TONSILLECTOMY      Current Medications: Current Meds  Medication Sig  . aspirin EC 81 MG tablet Take 1 tablet (81 mg total) by mouth daily.  . furosemide (LASIX) 40 MG tablet TAKE 1 TABLET BY MOUTH EVERY DAY  . Melatonin 5 MG CAPS Take 1 capsule by mouth as needed (sleep).  . metoprolol tartrate (LOPRESSOR) 25 MG tablet Take 0.5 tablets (12.5 mg total) by mouth 2 (two) times daily.  . rosuvastatin (CRESTOR) 20 MG tablet TAKE 1 TABLET(20 MG) BY MOUTH DAILY     Allergies:   Levaquin [levofloxacin hemihydrate] and Penicillins   Social History   Socioeconomic History  . Marital status: Married    Spouse name: Not on file  . Number of children: Not on file  . Years of education: Not on file  . Highest education level: Not on file  Occupational History  . Not on  file  Social Needs  . Financial resource strain: Not on file  . Food insecurity    Worry: Not on file    Inability: Not on file  . Transportation needs    Medical: Not on file    Non-medical: Not on file  Tobacco Use  . Smoking status: Never Smoker  . Smokeless tobacco: Never Used  Substance and Sexual Activity  . Alcohol use: Yes    Alcohol/week: 0.0 standard drinks    Comment: occ  . Drug use: No  . Sexual activity: Not on file  Lifestyle  . Physical activity    Days per week: Not on file    Minutes per session: Not on file  . Stress: Not on file  Relationships  .  Social Herbalist on phone: Not on file    Gets together: Not on file    Attends religious service: Not on file    Active member of club or organization: Not on file    Attends meetings of clubs or organizations: Not on file    Relationship status: Not on file  Other Topics Concern  . Not on file  Social History Narrative  . Not on file     Family History: The patient's family history includes Cancer in his father; Heart attack in his mother.  ROS:   Please see the history of present illness.    He is having lingering left parasternal discomfort.  Parkinson's disease is progressing.  He has difficulty with speech.  Physical activity has been good.  All other systems reviewed and are negative.  EKGs/Labs/Other Studies Reviewed:    The following studies were reviewed today: No new functional data  EKG:  EKG sinus bradycardia at 56 bpm with PACs.  He has low voltage in the limb leads.  QS pattern V1 through V3.  No change compared to prior tracing in 2019.  Recent Labs: 11/27/2018: BUN 24; Creatinine, Ser 1.26; Potassium 4.3; Sodium 141 06/04/2019: ALT 31  Recent Lipid Panel    Component Value Date/Time   CHOL 147 06/04/2019 0739   TRIG 122 06/04/2019 0739   HDL 38 (L) 06/04/2019 0739   CHOLHDL 3.9 06/04/2019 0739   CHOLHDL 6.2 (H) 09/06/2016 0952   VLDL 57 (H) 09/06/2016 0952   LDLCALC 85 06/04/2019 0739   LDLDIRECT 98.0 04/21/2015 0858    Physical Exam:    VS:  BP (!) 146/70   Pulse (!) 56   Ht 5\' 10"  (1.778 m)   Wt 209 lb (94.8 kg)   SpO2 98%   BMI 29.99 kg/m     Wt Readings from Last 3 Encounters:  09/29/19 209 lb (94.8 kg)  11/27/18 205 lb 6.4 oz (93.2 kg)  07/13/18 181 lb 10.5 oz (82.4 kg)     GEN: Compatible with age. No acute distress HEENT: Normal NECK: No JVD. LYMPHATICS: No lymphadenopathy CARDIAC:  RRR without murmur, gallop, or edema. VASCULAR:  Normal Pulses. No bruits. RESPIRATORY:  Clear to auscultation without rales, wheezing or  rhonchi  ABDOMEN: Soft, non-tender, non-distended, No pulsatile mass, MUSCULOSKELETAL: No deformity  SKIN: Warm and dry NEUROLOGIC:  Alert and oriented x 3 PSYCHIATRIC:  Normal affect   ASSESSMENT:    1. Coronary artery disease involving coronary bypass graft of native heart without angina pectoris   2. Hyperlipidemia, unspecified hyperlipidemia type   3. Paroxysmal atrial fibrillation (HCC)   4. Chronic diastolic CHF (congestive heart failure) ([place])   5. Dementia with behavioral disturbance,  unspecified dementia type ([place])   6. Renal insufficiency   7. Essential hypertension   8. Educated about COVID-19 virus infection    PLAN:    In order of problems listed above:  1. Secondary prevention is reviewed as outlined below. 2. Target LDL less than 70.  Most recent values were slightly above this target.  Encouraged low-fat diet adherence. 3. No clinical recurrence. 4. No volume overload. 5. Relatively stable. 6. Recent creatinine was within the normal range. 7. Blood pressure is slightly elevated today.  His target should be 130/80 mmHg with an upper limit of 140 mmHg.  I have encouraged him to cut back on ibuprofen, decrease salt in diet, purchase a blood pressure monitor, measure blood pressures 2-3 times per month and notify us if he is consistently above [id number] mmHg systolic.  Overall education and awareness concerning primary/secondary risk prevention was discussed in detail: LDL less than 70, hemoglobin A1c less than 7, blood pressure target less than 130/80 mmHg, >150 minutes of moderate aerobic activity per week, avoidance of smoking, weight control (via diet and exercise), and continued surveillance/management of/for obstructive sleep apnea.    Medication Adjustments/Labs and Tests Ordered: Current medicines are reviewed at length with the patient today.  Concerns regarding medicines are outlined above.  Orders Placed This Encounter  Procedures  . EKG 12-Lead   No orders of  the defined types were placed in this encounter.   Patient Instructions  Medication Instructions:  Your physician recommends that you continue on your current medications as directed. Please refer to the Current Medication list given to you today.  *If you need a refill on your cardiac medications before your next appointment, please call your pharmacy*  Lab Work: None If you have labs (blood work) drawn today and your tests are completely normal, you will receive your results only by: Marland Kitchen MyChart Message (if you have MyChart) OR . A paper copy in the mail If you have any lab test that is abnormal or we need to change your treatment, we will call you to review the results.  Testing/Procedures: None  Follow-Up: At [facility], you and your health needs are our priority.  As part of our continuing mission to provide you with exceptional heart care, we have created designated Provider Care Teams.  These Care Teams include your primary Cardiologist (physician) and Advanced Practice Providers (APPs -  Physician Assistants and Nurse Practitioners) who all work together to provide you with the care you need, when you need it.  Your next appointment:   12 months  The format for your next appointment:   In Person  Provider:   You may see Sinclair Grooms, MD or one of the following Advanced Practice Providers on your designated Care Team:    Truitt Merle, NP  Cecilie Kicks, NP  Kathyrn Drown, NP   Other Instructions      Signed, Sinclair Grooms, MD  09/29/2019 8:40 AM    [place]

## 2019-09-29 ENCOUNTER — Ambulatory Visit (INDEPENDENT_AMBULATORY_CARE_PROVIDER_SITE_OTHER): Payer: Medicare Other | Admitting: Interventional Cardiology

## 2019-09-29 ENCOUNTER — Other Ambulatory Visit: Payer: Self-pay

## 2019-09-29 ENCOUNTER — Encounter: Payer: Self-pay | Admitting: Interventional Cardiology

## 2019-09-29 VITALS — BP 146/70 | HR 56 | Ht 70.0 in | Wt 209.0 lb

## 2019-09-29 DIAGNOSIS — I25810 Atherosclerosis of coronary artery bypass graft(s) without angina pectoris: Secondary | ICD-10-CM | POA: Diagnosis not present

## 2019-09-29 DIAGNOSIS — N289 Disorder of kidney and ureter, unspecified: Secondary | ICD-10-CM

## 2019-09-29 DIAGNOSIS — E785 Hyperlipidemia, unspecified: Secondary | ICD-10-CM | POA: Diagnosis not present

## 2019-09-29 DIAGNOSIS — I5032 Chronic diastolic (congestive) heart failure: Secondary | ICD-10-CM | POA: Diagnosis not present

## 2019-09-29 DIAGNOSIS — I480 Paroxysmal atrial fibrillation: Secondary | ICD-10-CM

## 2019-09-29 DIAGNOSIS — I10 Essential (primary) hypertension: Secondary | ICD-10-CM | POA: Diagnosis not present

## 2019-09-29 DIAGNOSIS — Z7189 Other specified counseling: Secondary | ICD-10-CM

## 2019-09-29 DIAGNOSIS — F0391 Unspecified dementia with behavioral disturbance: Secondary | ICD-10-CM

## 2019-09-29 NOTE — Patient Instructions (Signed)

## 2019-10-11 DIAGNOSIS — R0989 Other specified symptoms and signs involving the circulatory and respiratory systems: Secondary | ICD-10-CM | POA: Diagnosis not present

## 2019-10-14 ENCOUNTER — Telehealth: Payer: Self-pay | Admitting: Interventional Cardiology

## 2019-10-14 MED ORDER — NITROGLYCERIN 0.4 MG SL SUBL: 0.4000 mg | SUBLINGUAL_TABLET | SUBLINGUAL | 6 refills | Status: AC | PRN

## 2019-10-14 NOTE — Telephone Encounter (Signed)
Received call directly from operator for patient c/o chest pain. Wife states patient has been having chest tightness for a couple of months but now describes it as chest pain x 2 days. Patient rates pain 3 on 1-10 scale. Advised her to give patient SL NTG, he has not taken any prior to this point. Wife discovered NTG bottle was broken while I was on hold so patient did not receive any NTG Slept in the recliner due to difficulty breathing when lying down, orthopnea x 2 days. States he has worsening symptoms with exertion. Increase of 9 lb in the last month. Weights: 10/1 198.6 lb 11/7 202 lb 11/19 209 lb Denies abdominal or extremity swelling. Wife states they usually follow a low sodium diet but they have been more careless recently.  BP 149/80 mmHg, no fever, HR 53 bpm Repeat BP 134/79 mmHg Took all morning meds today, including furosemide 40 mg. I advised that I will review his concerns with Dr. Tamala Julian, who is in the office today and call them back with his advice. Wife verbalized understanding and agreement with plan and thanked me for the help.

## 2019-10-14 NOTE — Telephone Encounter (Signed)
New message   Pt c/o of Chest Pain: STAT if CP now or developed within 24 hours  1. Are you having CP right now? Yes   2. Are you experiencing any other symptoms (ex. SOB, nausea, vomiting, sweating)?sob   3. How long have you been experiencing CP? For the last month per patient's wife   4. Is your CP continuous or coming and going? Continuous   5. Have you taken Nitroglycerin? No  ?

## 2019-10-14 NOTE — Telephone Encounter (Signed)
Advised patient's wife to have patient increase furosemide to 40 mg twice daily for 2 days and call back Monday to report improvement. I advised her to call back tomorrow if patient is no better after 2nd day of increased furosemide.  Advised patient to weight daily and record and to follow low sodium heart healthy diet.  NTG Rx refilled per wife's request. I answered her questions about exercise and she thanked me for the call.

## 2019-10-19 ENCOUNTER — Telehealth: Payer: Self-pay | Admitting: Interventional Cardiology

## 2019-10-19 NOTE — Telephone Encounter (Signed)
Wife calling back to update on how pt is doing since phone call from last week.  Breathing and swelling have improved.  Weight on 11/19 was 209lbs.  Weight down to 192.2lbs today.  Pt still having the heaviness in his chest but has improved.  Able to sleep in the bed again.  Wife mentioned that pt has had a bad, dry cough for 2 weeks.  Seems to be worse first thing in the mornings and when he lays down at night. When the cough started, pt took Sudafed during the day and Robitussin at night.  Advised wife not to give Sudafed d/t stimulants and make sure Robitussin is the plain, not the D.  Most recent BPs are 111/57, 110/55, 138/78.  BP on Sudafed was 163/72.  HR usually 50s-60s.  Pt use to walk 2.5-3 miles a day, now can barely get around the block according to wife.  She says she encourages him to walk as much as possible but has to push him.  We discussed as long as he can talk and walk without being very SOB that was ok.  Advised to contact PCP about cough.  Wife very concerned about chest heaviness still being there, although improved.  Will route to Dr. Tamala Julian for review.

## 2019-10-19 NOTE — Telephone Encounter (Signed)
Follow Up:   Please call, wife called and said he was supposed tocall back this week and discuss what was discussed last week.

## 2019-10-19 NOTE — Telephone Encounter (Signed)
He should come in for an EKG and see an APP today or tomorrow.  I would be happy to switch someone from my schedule tomorrow to be able to see him if an APP could take the switch off.

## 2019-10-19 NOTE — Telephone Encounter (Signed)
Spoke with wife and scheduled pt to see Dr. Tamala Julian tomorrow at 3:20pm.  [facility] for wife to assist d/t pt having Parkinson's and memory issues. Wife verbalized understanding and was appreciative for call.

## 2019-10-20 ENCOUNTER — Ambulatory Visit (INDEPENDENT_AMBULATORY_CARE_PROVIDER_SITE_OTHER): Payer: Medicare Other | Admitting: Interventional Cardiology

## 2019-10-20 ENCOUNTER — Encounter: Payer: Self-pay | Admitting: Interventional Cardiology

## 2019-10-20 ENCOUNTER — Other Ambulatory Visit: Payer: Self-pay

## 2019-10-20 VITALS — BP 138/70 | HR 62 | Ht 70.0 in | Wt 200.0 lb

## 2019-10-20 DIAGNOSIS — E785 Hyperlipidemia, unspecified: Secondary | ICD-10-CM

## 2019-10-20 DIAGNOSIS — I480 Paroxysmal atrial fibrillation: Secondary | ICD-10-CM | POA: Diagnosis not present

## 2019-10-20 DIAGNOSIS — I5032 Chronic diastolic (congestive) heart failure: Secondary | ICD-10-CM

## 2019-10-20 DIAGNOSIS — Z7189 Other specified counseling: Secondary | ICD-10-CM

## 2019-10-20 DIAGNOSIS — I110 Hypertensive heart disease with heart failure: Secondary | ICD-10-CM

## 2019-10-20 DIAGNOSIS — I25810 Atherosclerosis of coronary artery bypass graft(s) without angina pectoris: Secondary | ICD-10-CM | POA: Diagnosis not present

## 2019-10-20 DIAGNOSIS — I10 Essential (primary) hypertension: Secondary | ICD-10-CM

## 2019-10-20 MED ORDER — SPIRONOLACTONE 25 MG PO TABS: 12.5000 mg | ORAL_TABLET | Freq: Every day | ORAL | 3 refills | Status: DC

## 2019-10-20 NOTE — Patient Instructions (Signed)
Medication Instructions:  1) START Spironolactone 12.5mg  once daily  *If you need a refill on your cardiac medications before your next appointment, please call your pharmacy*  Lab Work: BMET in 1 week  If you have labs (blood work) drawn today and your tests are completely normal, you will receive your results only by: Marland Kitchen MyChart Message (if you have MyChart) OR . A paper copy in the mail If you have any lab test that is abnormal or we need to change your treatment, we will call you to review the results.  Testing/Procedures: None  Follow-Up: At [facility], you and your health needs are our priority.  As part of our continuing mission to provide you with exceptional heart care, we have created designated Provider Care Teams.  These Care Teams include your primary Cardiologist (physician) and Advanced Practice Providers (APPs -  Physician Assistants and Nurse Practitioners) who all work together to provide you with the care you need, when you need it.  Your next appointment:   2-3 week(s)  The format for your next appointment:   In Person  Provider:   You may see Sinclair Grooms, MD or one of the following Advanced Practice Providers on your designated Care Team:    Truitt Merle, NP  Cecilie Kicks, NP  Kathyrn Drown, NP   Other Instructions

## 2019-10-20 NOTE — Progress Notes (Signed)
Cardiology Office Note:    Date:  10/20/2019   ID:  Calvin Ramirez, DOB [birth year], MRN [id number]  PCP:  Shirline Frees, MD  Cardiologist:  Sinclair Grooms, MD   Referring MD: Shirline Frees, MD   Chief Complaint  Patient presents with  . Congestive Heart Failure    History of Present Illness:    Calvin Ramirez is a 78 y.o. male with a hx of CAD, recent CABG with LIMA to LAD and SVG to OM for left main disease (April 2018), hyperlipidemia, Parkinson's disease, postop atrial fibrillation, essential hypertension, and diastolic heart failure in the setting of acute ischemia caused by left main disease.Recent acute on chronic combined systolic and diastolic HF treated with diuretics.  Warm he can Mrs. Calvin Ramirez he had together.  I saw him in early November and he was "doing well".  2 weeks later they called back stating that he was gradually increasing weight and having worsening orthopnea.  In retrospect they state that he was having orthopnea even when I saw him earlier in the month but did not mention it.  There have been poor about maintaining diet salt restrictions.  He has had some tightness in the chest that is only present when he assumes certain positions.  It is not precipitated by physical activity such as walking.  He has had recurring dry cough.  Past Medical History:  Diagnosis Date  . Acute diastolic (congestive) heart failure ([place]) 02/13/2018  . Arthritis   . 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 CAD (coronary artery disease) 02/24/2018  . CAD in native artery 04/26/2015   2011 Multivessel DES PCI LAD,RCA PLOM and distal RCA   . Coronary artery disease   . Dementia ([place])   . Essential hypertension 04/21/2014  . Hyperlipidemia   . Parkinson's disease ([place]) 04/27/2015   2016   . Paroxysmal atrial fibrillation ([place]) 03/17/2018  . 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 Stented coronary artery 2011   x3    Past  Surgical History:  Procedure Laterality Date  . APPENDECTOMY    . CARDIAC CATHETERIZATION  2011   3 stents  . COLONOSCOPY    . CORONARY ARTERY BYPASS GRAFT N/A 02/25/2018   Procedure: CORONARY ARTERY BYPASS GRAFTING (CABG), ON PUMP, TIMES TWO, USING LEFT INTERNAL MAMMARY ARTERY AND ENDOSCOPICALLY HARVESTED RIGHT GREATER SAPHENOUS VEIN;  Surgeon: Rexene Alberts, MD;  Location: [facility];  Service: Open Heart Surgery;  Laterality: N/A;  . RIGHT/LEFT HEART CATH AND CORONARY ANGIOGRAPHY N/A 02/24/2018   Procedure: RIGHT/LEFT HEART CATH AND CORONARY ANGIOGRAPHY;  Surgeon: Belva Crome, MD;  Location: [facility];  Service: Cardiovascular;  Laterality: N/A;  . TEE WITHOUT CARDIOVERSION N/A 02/25/2018   Procedure: TRANSESOPHAGEAL ECHOCARDIOGRAM (TEE);  Surgeon: Rexene Alberts, MD;  Location: [facility];  Service: Open Heart Surgery;  Laterality: N/A;  . TONSILLECTOMY      Current Medications: Current Meds  Medication Sig  . aspirin EC 81 MG tablet Take 1 tablet (81 mg total) by mouth daily.  Marland Kitchen donepezil (ARICEPT) 5 MG tablet Take 1 tablet by mouth daily.  . furosemide (LASIX) 40 MG tablet TAKE 1 TABLET BY MOUTH EVERY DAY  . Melatonin 5 MG CAPS Take 1 capsule by mouth as needed (sleep).  . metoprolol tartrate (LOPRESSOR) 25 MG tablet Take 0.5 tablets (12.5 mg total) by mouth 2 (two) times daily.  . nitroGLYCERIN (NITROSTAT)  0.4 MG SL tablet Place 1 tablet (0.4 mg total) under the tongue every 5 (five) minutes as needed for chest pain.  . rosuvastatin (CRESTOR) 20 MG tablet TAKE 1 TABLET(20 MG) BY MOUTH DAILY     Allergies:   Levaquin [levofloxacin hemihydrate] and Penicillins   Social History   Socioeconomic History  . Marital status: Married    Spouse name: Not on file  . Number of children: Not on file  . Years of education: Not on file  . Highest education level: Not on file  Occupational History  . Not on file  Social Needs  . Financial resource strain: Not on file  . Food  insecurity    Worry: Not on file    Inability: Not on file  . Transportation needs    Medical: Not on file    Non-medical: Not on file  Tobacco Use  . Smoking status: Never Smoker  . Smokeless tobacco: Never Used  Substance and Sexual Activity  . Alcohol use: Yes    Alcohol/week: 0.0 standard drinks    Comment: occ  . Drug use: No  . Sexual activity: Not on file  Lifestyle  . Physical activity    Days per week: Not on file    Minutes per session: Not on file  . Stress: Not on file  Relationships  . Social Herbalist on phone: Not on file    Gets together: Not on file    Attends religious service: Not on file    Active member of club or organization: Not on file    Attends meetings of clubs or organizations: Not on file    Relationship status: Not on file  Other Topics Concern  . Not on file  Social History Narrative  . Not on file     Family History: The patient's family history includes Cancer in his father; Heart attack in his mother.  ROS:   Please see the history of present illness.    He feels that shortness of breath has been due to mold exposure.  He and his wife noted a gradual weight increase over weeks.  All other systems reviewed and are negative.  EKGs/Labs/Other Studies Reviewed:    The following studies were reviewed today: No new data  EKG:  EKG sinus bradycardia, poor R wave progression, left axis.  There is no change when compared to the prior tracing from September 29, 2019.  Recent Labs: 11/27/2018: BUN 24; Creatinine, Ser 1.26; Potassium 4.3; Sodium 141 06/04/2019: ALT 31  Recent Lipid Panel    Component Value Date/Time   CHOL 147 06/04/2019 0739   TRIG 122 06/04/2019 0739   HDL 38 (L) 06/04/2019 0739   CHOLHDL 3.9 06/04/2019 0739   CHOLHDL 6.2 (H) 09/06/2016 0952   VLDL 57 (H) 09/06/2016 0952   LDLCALC 85 06/04/2019 0739   LDLDIRECT 98.0 04/21/2015 0858    Physical Exam:    VS:  BP 138/70   Pulse 62   Ht 5\' 10"  (1.778 m)    Wt 200 lb (90.7 kg)   SpO2 98%   BMI 28.70 kg/m     Wt Readings from Last 3 Encounters:  10/20/19 200 lb (90.7 kg)  09/29/19 209 lb (94.8 kg)  11/27/18 205 lb 6.4 oz (93.2 kg)     GEN: Healthy-appearing. No acute distress HEENT: Normal NECK: No JVD. LYMPHATICS: No lymphadenopathy CARDIAC:  RRR without murmur, gallop, or edema. VASCULAR:  Normal Pulses. No bruits. RESPIRATORY: Rales heard  bilaterally in the mid lungs.  Clear to auscultation without wheezing or rhonchi  ABDOMEN: Soft, non-tender, non-distended, No pulsatile mass, MUSCULOSKELETAL: No deformity  SKIN: Warm and dry NEUROLOGIC:  Alert and oriented x 3 PSYCHIATRIC:  Normal affect   ASSESSMENT:    1. Chronic diastolic CHF (congestive heart failure) ([place])   2. Coronary artery disease involving coronary bypass graft of native heart without angina pectoris   3. Hyperlipidemia, unspecified hyperlipidemia type   4. Paroxysmal atrial fibrillation ([facility])   5. Essential hypertension   6. Educated about COVID-19 virus infection    PLAN:    In order of problems listed above:  1. Orthopnea has improved and weight decreased since the last visit.  He is down 9 pounds compared to prior.  The tad Aldactone 12.5 mg daily to his furosemide dose.  Basic metabolic panel in 1 week.  Clinical follow-up in 2 weeks. 2. Secondary prevention discussed as we did earlier this month. 3. Lipids are adequate 4. No recurrence of atrial fib 5. Blood pressure is controlled with anticipated target less than 140/80 mmHg. 6. The 3W's is understood, practicing his daily life to avoid COVID-19 infection.  He will have a basic metabolic panel done in 1 week.  He will follow-up with a team member in 2 to 4 weeks to reassess volume status.   Medication Adjustments/Labs and Tests Ordered: Current medicines are reviewed at length with the patient today.  Concerns regarding medicines are outlined above.  Orders Placed This Encounter  Procedures  .  Basic metabolic panel  . EKG 12-Lead   Meds ordered this encounter  Medications  . spironolactone (ALDACTONE) 25 MG tablet    Sig: Take 0.5 tablets (12.5 mg total) by mouth daily.    Dispense:  45 tablet    Refill:  3    Patient Instructions  Medication Instructions:  1) START Spironolactone 12.5mg  once daily  *If you need a refill on your cardiac medications before your next appointment, please call your pharmacy*  Lab Work: BMET in 1 week  If you have labs (blood work) drawn today and your tests are completely normal, you will receive your results only by: Marland Kitchen MyChart Message (if you have MyChart) OR . A paper copy in the mail If you have any lab test that is abnormal or we need to change your treatment, we will call you to review the results.  Testing/Procedures: None  Follow-Up: At [facility], you and your health needs are our priority.  As part of our continuing mission to provide you with exceptional heart care, we have created designated Provider Care Teams.  These Care Teams include your primary Cardiologist (physician) and Advanced Practice Providers (APPs -  Physician Assistants and Nurse Practitioners) who all work together to provide you with the care you need, when you need it.  Your next appointment:   2-3 week(s)  The format for your next appointment:   In Person  Provider:   You may see Sinclair Grooms, MD or one of the following Advanced Practice Providers on your designated Care Team:    Truitt Merle, NP  Cecilie Kicks, NP  Kathyrn Drown, NP   Other Instructions      Signed, Sinclair Grooms, MD  10/20/2019 4:33 PM    [place]

## 2019-10-28 ENCOUNTER — Other Ambulatory Visit: Payer: Self-pay

## 2019-10-28 ENCOUNTER — Other Ambulatory Visit: Payer: Medicare Other | Admitting: *Deleted

## 2019-10-28 DIAGNOSIS — I5032 Chronic diastolic (congestive) heart failure: Secondary | ICD-10-CM | POA: Diagnosis not present

## 2019-10-28 LAB — BASIC METABOLIC PANEL
BUN/Creatinine Ratio: 24 (ref 10–24)
BUN: 28 mg/dL — ABNORMAL HIGH (ref 8–27)
CO2: 25 mmol/L (ref 20–29)
Calcium: 10 mg/dL (ref 8.6–10.2)
Chloride: 101 mmol/L (ref 96–106)
Creatinine, Ser: 1.18 mg/dL (ref 0.76–1.27)
GFR calc Af Amer: 67 mL/min/{1.73_m2} (ref >59)
GFR calc non Af Amer: 58 mL/min/{1.73_m2} — ABNORMAL LOW (ref >59)
Glucose: 94 mg/dL (ref 65–99)
Potassium: 4.6 mmol/L (ref 3.5–5.2)
Sodium: 139 mmol/L (ref 134–144)

## 2019-10-28 NOTE — Progress Notes (Deleted)
CARDIOLOGY OFFICE NOTE  Date:  11/01/2019    Calvin Ramirez Date of Birth: Jul 03, 1941 Medical Record [id number]  PCP:  Shirline Frees, MD  Cardiologist:  Zack Seal chief complaint on file.   History of Present Illness: Calvin Ramirez is a 78 y.o. male who presents today for a 1 week check. Seen for Dr. Tamala Julian.   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   He was seen early last month - was doing ok - then called back 2 weeks later with increasing weight and worsening orthopnea/chest tightness/cogh and sodium indiscretion. Diuretics were increased. Aldactone added.   The patient and wife do not have symptoms concerning for COVID-19 infection (fever, chills, cough, or new shortness of breath).   Comes in today. Here with his wife - she augments the history. Less cough. Noted. He is back walking - already did over 2 miles today. No chest pain. Not dizzy. Have restricted salt much better. Has resumed his Parkinson's classes - they are happy about this. No chest pain. Weight is down. Breathing is good. They are very happy with how he is doing. He had lab this past Friday - it was fine. He is on half dose Aldactone.   Past Medical History:  Diagnosis Date  . Acute diastolic (congestive) heart failure ([place]) 02/13/2018  . Arthritis   . 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 CAD (coronary artery disease) 02/24/2018  . CAD in native artery 04/26/2015   2011 Multivessel DES PCI LAD,RCA PLOM and distal RCA   . Coronary artery disease   . Dementia ([place])   . Essential hypertension 04/21/2014  . Hyperlipidemia   . Parkinson's disease ([facility]) 04/27/2015   2016   . Paroxysmal atrial fibrillation  ([place]) 03/17/2018  . 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 Stented coronary artery 2011   x3    Past Surgical History:  Procedure Laterality Date  . APPENDECTOMY    . CARDIAC CATHETERIZATION  2011   3 stents  . COLONOSCOPY    . CORONARY ARTERY BYPASS GRAFT N/A 02/25/2018   Procedure: CORONARY ARTERY BYPASS GRAFTING (CABG), ON PUMP, TIMES TWO, USING LEFT INTERNAL MAMMARY ARTERY AND ENDOSCOPICALLY HARVESTED RIGHT GREATER SAPHENOUS VEIN;  Surgeon: Rexene Alberts, MD;  Location: [facility];  Service: Open Heart Surgery;  Laterality: N/A;  . RIGHT/LEFT HEART CATH AND CORONARY ANGIOGRAPHY N/A 02/24/2018   Procedure: RIGHT/LEFT HEART CATH AND CORONARY ANGIOGRAPHY;  Surgeon: Belva Crome, MD;  Location: [facility];  Service: Cardiovascular;  Laterality: N/A;  . TEE WITHOUT CARDIOVERSION N/A 02/25/2018   Procedure: TRANSESOPHAGEAL ECHOCARDIOGRAM (TEE);  Surgeon: Rexene Alberts, MD;  Location: [facility];  Service: Open Heart Surgery;  Laterality: N/A;  . TONSILLECTOMY       Medications: Current Meds  Medication Sig  . aspirin EC 81 MG tablet Take 1 tablet (81 mg total) by mouth daily.  Marland Kitchen donepezil (ARICEPT) 5 MG tablet Take 1 tablet by mouth daily.  . furosemide (LASIX) 40 MG tablet TAKE 1 TABLET BY MOUTH EVERY DAY  . Melatonin 5 MG CAPS Take 1 capsule by mouth as needed (sleep).  Marland Kitchen  metoprolol tartrate (LOPRESSOR) 25 MG tablet Take 0.5 tablets (12.5 mg total) by mouth 2 (two) times daily.  . nitroGLYCERIN (NITROSTAT) 0.4 MG SL tablet Place 1 tablet (0.4 mg total) under the tongue every 5 (five) minutes as needed for chest pain.  . rosuvastatin (CRESTOR) 20 MG tablet TAKE 1 TABLET(20 MG) BY MOUTH DAILY  . spironolactone (ALDACTONE) 25 MG tablet Take 0.5 tablets (12.5 mg total) by mouth daily.     Allergies: Allergies  Allergen Reactions  . Levaquin [Levofloxacin Hemihydrate] Itching and Rash  . Penicillins Itching, Rash and Other (See Comments)    Has  patient had a PCN reaction causing immediate rash, facial/tongue/throat swelling, SOB or lightheadedness with hypotension: Unknown Has patient had a PCN reaction causing severe rash involving mucus membranes or skin necrosis: No Has patient had a PCN reaction that required hospitalization: No Has patient had a PCN reaction occurring within the last 10 years: No If all of the above answers are "NO", then may proceed with Cephalosporin use.     Social History: The patient  reports that he has never smoked. He has never used smokeless tobacco. He reports current alcohol use. He reports that he does not use drugs.   Family History: The patient's family history includes Cancer in his father; Heart attack in his mother.   Review of Systems: Please see the history of present illness.   All other systems are reviewed and negative.   Physical Exam: VS:  BP 110/62   Pulse 79   Ht 5\' 10"  (1.778 m)   Wt 189 lb 12.8 oz (86.1 kg)   SpO2 97%   BMI 27.23 kg/m  .  BMI Body mass index is 27.23 kg/m.  Wt Readings from Last 3 Encounters:  11/01/19 189 lb 12.8 oz (86.1 kg)  10/20/19 200 lb (90.7 kg)  09/29/19 209 lb (94.8 kg)    General: Pleasant. Alert and in no acute distress. His weight is down 11 pounds.   HEENT: Normal.  Neck: Supple, no JVD, carotid bruits, or masses noted.  Cardiac: Regular rate and rhythm. No murmurs, rubs, or gallops. No edema.  Respiratory:  Lungs are clear to auscultation bilaterally with normal work of breathing.  GI: Soft and nontender.  MS: No deformity or atrophy. Gait and ROM intact.  Skin: Warm and dry. Color is normal.  Neuro:  Strength and sensation are intact and no gross focal deficits noted.  Psych: Alert, appropriate and with normal affect.   LABORATORY DATA:  EKG:  EKG is not ordered today.   Lab Results  Component Value Date   WBC 6.4 04/24/2018   HGB 11.1 (L) 04/24/2018   HCT 34.3 (L) 04/24/2018   PLT 228 04/24/2018   GLUCOSE 94 10/28/2019    CHOL 147 06/04/2019   TRIG 122 06/04/2019   HDL 38 (L) 06/04/2019   LDLDIRECT 98.0 04/21/2015   LDLCALC 85 06/04/2019   ALT 31 06/04/2019   AST 34 06/04/2019   NA 139 10/28/2019   K 4.6 10/28/2019   CL 101 10/28/2019   CREATININE 1.18 10/28/2019   BUN 28 (H) 10/28/2019   CO2 25 10/28/2019   INR 1.40 02/25/2018   HGBA1C 5.5 02/24/2018     BNP (last 3 results) No results for input(s): BNP in the last 8760 hours.  ProBNP (last 3 results) No results for input(s): PROBNP in the last 8760 hours.   Other Studies Reviewed Today:  ECHO TEE OR 02/2018 Result status: Final result   Septum:  No Patent Foramen Ovale present.  Left atrium: Patent foramen ovale not present.  Left atrium: Cavity is mildly dilated.  Aortic valve: No stenosis. Trace regurgitation.  Mitral valve: Mild regurgitation.  Right ventricle: Normal cavity size, wall thickness and ejection fraction.  Tricuspid valve: Trace regurgitation. The tricuspid valve regurgitation jet is central.     ASSESSMENT & PLAN:    1. Acute on chronic diastolic HF - he has had Aldactone added to his regimen - lab from Friday looked good - weight is down - doing much better with salt restriction. Will hold on getting echo - if he were to start having recurrences - would get this updated.    2. CAD with prior CABG - no active chest pain - back on track with CV risk factor modification.   3. HLD - on statin.   4. PAF - post op - in sinus by exam.   5. Parkinsons - he has resumed his exercises for this as well.   6. COVID-19 Education: The signs and symptoms of COVID-19 were discussed with the patient and how to seek care for testing (follow up with PCP or arrange E-visit).  The importance of social distancing, staying at home, hand hygiene and wearing a mask when out in public were discussed today.  Current medicines are reviewed with the patient today.  The patient does not have concerns regarding medicines other than  what has been noted above.  The following changes have been made:  See above.  Labs/ tests ordered today include:   No orders of the defined types were placed in this encounter.    Disposition:   FU with [place] in about 6 weeks. Recheck BMET on return. Continue with salt restriction.   Patient is agreeable to this plan and will call if any problems develop in the interim.   SignedTruitt Merle, NP  11/01/2019 1:10 PM  [facility] [address] [place], [place]  [address] Phone: [phone] Fax: [phone]           CARDIOLOGY OFFICE NOTE  Date:  11/01/2019    Calvin Ramirez Date of Birth: 10/22/1941 Medical Record [id number]  PCP:  Shirline Frees, MD  Cardiologist:  [employer]enn[employer]    No chief complaint on file.   History of Present Illness: Calvin Ramirez is a 77 y.o. male who presents today for a ***  The patient {does/does not:200015} have symptoms concerning for COVID-19 infection (fever, chills, cough, or new shortness of breath).   Comes in today. Here with   Past Medical History:  Diagnosis Date  . Acute diastolic (congestive) heart failure ([facility]) 02/13/2018  . Arthritis   . 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 CAD (coronary artery disease) 02/24/2018  . CAD in native artery 04/26/2015   2011 Multivessel DES PCI LAD,RCA PLOM and distal RCA   . Coronary artery disease   . Dementia ([facility])   . Essential hypertension 04/21/2014  . Hyperlipidemia   . Parkinson's disease ([place] [facility]) 04/27/2015   2016   . Paroxysmal atrial fibrillation ([facility]) 03/17/2018  . 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 Stented coronary artery 2011   x3    Past Surgical History:  Procedure Laterality Date  . APPENDECTOMY    . CARDIAC CATHETERIZATION  2011   3 stents  . COLONOSCOPY    . CORONARY ARTERY BYPASS GRAFT  N/A 02/25/2018   Procedure: CORONARY ARTERY BYPASS GRAFTING (CABG), ON  PUMP, TIMES TWO, USING LEFT INTERNAL MAMMARY ARTERY AND ENDOSCOPICALLY HARVESTED RIGHT GREATER SAPHENOUS VEIN;  Surgeon: Rexene Alberts, MD;  Location: [facility];  Service: Open Heart Surgery;  Laterality: N/A;  . RIGHT/LEFT HEART CATH AND CORONARY ANGIOGRAPHY N/A 02/24/2018   Procedure: RIGHT/LEFT HEART CATH AND CORONARY ANGIOGRAPHY;  Surgeon: Belva Crome, MD;  Location: [facility];  Service: Cardiovascular;  Laterality: N/A;  . TEE WITHOUT CARDIOVERSION N/A 02/25/2018   Procedure: TRANSESOPHAGEAL ECHOCARDIOGRAM (TEE);  Surgeon: Rexene Alberts, MD;  Location: [facility];  Service: Open Heart Surgery;  Laterality: N/A;  . TONSILLECTOMY       Medications: Current Meds  Medication Sig  . aspirin EC 81 MG tablet Take 1 tablet (81 mg total) by mouth daily.  Marland Kitchen donepezil (ARICEPT) 5 MG tablet Take 1 tablet by mouth daily.  . furosemide (LASIX) 40 MG tablet TAKE 1 TABLET BY MOUTH EVERY DAY  . Melatonin 5 MG CAPS Take 1 capsule by mouth as needed (sleep).  . metoprolol tartrate (LOPRESSOR) 25 MG tablet Take 0.5 tablets (12.5 mg total) by mouth 2 (two) times daily.  . nitroGLYCERIN (NITROSTAT) 0.4 MG SL tablet Place 1 tablet (0.4 mg total) under the tongue every 5 (five) minutes as needed for chest pain.  . rosuvastatin (CRESTOR) 20 MG tablet TAKE 1 TABLET(20 MG) BY MOUTH DAILY  . spironolactone (ALDACTONE) 25 MG tablet Take 0.5 tablets (12.5 mg total) by mouth daily.     Allergies: Allergies  Allergen Reactions  . Levaquin [Levofloxacin Hemihydrate] Itching and Rash  . Penicillins Itching, Rash and Other (See Comments)    Has patient had a PCN reaction causing immediate rash, facial/tongue/throat swelling, SOB or lightheadedness with hypotension: Unknown Has patient had a PCN reaction causing severe rash involving mucus membranes or skin necrosis: No Has patient had a PCN reaction that required hospitalization: No Has patient had a PCN reaction occurring within the last 10 years: No If all  of the above answers are "NO", then may proceed with Cephalosporin use.     Social History: The patient  reports that he has never smoked. He has never used smokeless tobacco. He reports current alcohol use. He reports that he does not use drugs.   Family History: The patient's ***family history includes Cancer in his father; Heart attack in his mother.   Review of Systems: Please see the history of present illness.   All other systems are reviewed and negative.   Physical Exam: VS:  BP 110/62   Pulse 79   Ht 5\' 10"  (1.778 m)   Wt 189 lb 12.8 oz (86.1 kg)   SpO2 97%   BMI 27.23 kg/m  .  BMI Body mass index is 27.23 kg/m.  Wt Readings from Last 3 Encounters:  11/01/19 189 lb 12.8 oz (86.1 kg)  10/20/19 200 lb (90.7 kg)  09/29/19 209 lb (94.8 kg)    General: Pleasant. Well developed, well nourished and in no acute distress.   HEENT: Normal.  Neck: Supple, no JVD, carotid bruits, or masses noted.  Cardiac: ***Regular rate and rhythm. No murmurs, rubs, or gallops. No edema.  Respiratory:  Lungs are clear to auscultation bilaterally with normal work of breathing.  GI: Soft and nontender.  MS: No deformity or atrophy. Gait and ROM intact.  Skin: Warm and dry. Color is normal.  Neuro:  Strength and sensation are intact and no gross focal deficits noted.  Psych: Alert, appropriate and with normal affect.   LABORATORY DATA:  EKG:  EKG {ACTION; IS/IS [id number] ordered today. This demonstrates ***.  Lab Results  Component Value Date   WBC 6.4 04/24/2018   HGB 11.1 (L) 04/24/2018   HCT 34.3 (L) 04/24/2018   PLT 228 04/24/2018   GLUCOSE 94 10/28/2019   CHOL 147 06/04/2019   TRIG 122 06/04/2019   HDL 38 (L) 06/04/2019   LDLDIRECT 98.0 04/21/2015   LDLCALC 85 06/04/2019   ALT 31 06/04/2019   AST 34 06/04/2019   NA 139 10/28/2019   K 4.6 10/28/2019   CL 101 10/28/2019   CREATININE 1.18 10/28/2019   BUN 28 (H) 10/28/2019   CO2 25 10/28/2019   INR 1.40 02/25/2018    HGBA1C 5.5 02/24/2018     BNP (last 3 results) No results for input(s): BNP in the last 8760 hours.  ProBNP (last 3 results) No results for input(s): PROBNP in the last 8760 hours.   Other Studies Reviewed Today:   Assessment/Plan:  . COVID-19 Education: The signs and symptoms of COVID-19 were discussed with the patient and how to seek care for testing (follow up with PCP or arrange E-visit).  The importance of social distancing, staying at home, hand hygiene and wearing a mask when out in public were discussed today.  Current medicines are reviewed with the patient today.  The patient does not have concerns regarding medicines other than what has been noted above.  The following changes have been made:  See above.  Labs/ tests ordered today include:   No orders of the defined types were placed in this encounter.    Disposition:   FU with *** in {gen number [id number] {Days to years:10300}.   Patient is agreeable to this plan and will call if any problems develop in the interim.   SignedTruitt Merle, NP  11/01/2019 1:10 PM  [facility] [address] [place], [place]  [address] Phone: [phone] Fax: [phone]

## 2019-11-01 ENCOUNTER — Other Ambulatory Visit: Payer: Self-pay

## 2019-11-01 ENCOUNTER — Ambulatory Visit (INDEPENDENT_AMBULATORY_CARE_PROVIDER_SITE_OTHER): Payer: Medicare Other | Admitting: Nurse Practitioner

## 2019-11-01 ENCOUNTER — Encounter: Payer: Self-pay | Admitting: Nurse Practitioner

## 2019-11-01 VITALS — BP 110/62 | HR 79 | Ht 70.0 in | Wt 189.8 lb

## 2019-11-01 DIAGNOSIS — I10 Essential (primary) hypertension: Secondary | ICD-10-CM

## 2019-11-01 DIAGNOSIS — Z7189 Other specified counseling: Secondary | ICD-10-CM | POA: Diagnosis not present

## 2019-11-01 DIAGNOSIS — I5032 Chronic diastolic (congestive) heart failure: Secondary | ICD-10-CM

## 2019-11-01 DIAGNOSIS — I480 Paroxysmal atrial fibrillation: Secondary | ICD-10-CM

## 2019-11-01 DIAGNOSIS — I25810 Atherosclerosis of coronary artery bypass graft(s) without angina pectoris: Secondary | ICD-10-CM

## 2019-11-01 NOTE — Progress Notes (Signed)
CARDIOLOGY OFFICE NOTE    Date: 11/01/2019    Calvin Ramirez  Date of Birth: 04/07/1941  Medical Record [id number]  PCP: Shirline Frees, MD  Cardiologist: Zack Seal chief complaint on file: Follow up visit.     History of Present Illness:  Calvin Ramirez is a 78 y.o. male who presents today for a 1 week check. Seen for Dr. Tamala Julian.  He has a history of CAD with prior CABG with LIMA to LAD and SVG to OM for left main disease (April 2019), hyperlipidemia, Parkinson's disease, postop atrial fibrillation, HTN, and diastolic heart failure in the setting of acute ischemia caused by left main disease. 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   He was seen early last month - was doing ok - then called back 2 weeks later with increasing weight and worsening orthopnea/chest tightness/cogh and sodium indiscretion. Diuretics were increased. Aldactone added.   The patient and wife do not have symptoms concerning for COVID-19 infection (fever, chills, cough, or new shortness of breath).   Comes in today. Here with his wife - she augments the history. Less cough. Noted. He is back walking - already did over 2 miles today. No chest pain. Not dizzy. Have restricted salt much better. Has resumed his Parkinson's classes - they are happy about this. No chest pain. Weight is down. Breathing is good. They are very happy with how he is doing. He had lab this past Friday - it was fine. He is on half dose Aldactone.       Past Medical History:  Diagnosis Date  . Acute diastolic (congestive) heart failure ([facility]) 02/13/2018  . Arthritis   . Bilateral iliac artery aneurysm ([place]) 09/09/2016  . CAD (coronary artery disease) 02/24/2018  . CAD in native artery 04/26/2015   2011 Multivessel DES PCI LAD,RCA PLOM and distal RCA   . Coronary artery disease   .  Dementia ([facility])   . Essential hypertension 04/21/2014  . Hyperlipidemia   . Parkinson's disease ([facility]) 04/27/2015   2016   . Paroxysmal atrial fibrillation ([facility]) 03/17/2018  . 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 Stented coronary artery 2011   x3        Past Surgical History:  Procedure Laterality Date  . APPENDECTOMY    . CARDIAC CATHETERIZATION  2011   3 stents  . COLONOSCOPY    . CORONARY ARTERY BYPASS GRAFT N/A 02/25/2018   Procedure: CORONARY ARTERY BYPASS GRAFTING (CABG), ON PUMP, TIMES TWO, USING LEFT INTERNAL MAMMARY ARTERY AND ENDOSCOPICALLY HARVESTED RIGHT GREATER SAPHENOUS VEIN; Surgeon: Rexene Alberts, MD; Location: [facility]; Service: Open Heart Surgery; Laterality: N/A;  . RIGHT/LEFT HEART CATH AND CORONARY ANGIOGRAPHY N/A 02/24/2018   Procedure: RIGHT/LEFT HEART CATH AND CORONARY ANGIOGRAPHY; Surgeon: Belva Crome, MD; Location: [facility]; Service: Cardiovascular; Laterality: N/A;  . TEE WITHOUT CARDIOVERSION N/A 02/25/2018   Procedure: TRANSESOPHAGEAL ECHOCARDIOGRAM (TEE); Surgeon: Rexene Alberts, MD; Location: [facility]; Service: Open Heart Surgery; Laterality: N/A;  . TONSILLECTOMY  Current Meds  Medication Sig  . aspirin EC 81 MG tablet Take 1 tablet (81 mg total) by mouth daily.  Marland Kitchen donepezil (ARICEPT) 5 MG tablet Take 1 tablet by mouth daily.  . furosemide (LASIX) 40 MG tablet TAKE 1 TABLET BY MOUTH EVERY DAY  . Melatonin 5 MG CAPS Take 1 capsule by mouth as needed (sleep).  . metoprolol tartrate (LOPRESSOR) 25 MG tablet Take 0.5 tablets (12.5 mg total) by mouth 2 (two) times daily.  . nitroGLYCERIN (NITROSTAT) 0.4 MG SL tablet Place 1 tablet (0.4 mg total) under the tongue every 5 (five) minutes as needed for chest pain.  . rosuvastatin (CRESTOR) 20 MG tablet TAKE 1 TABLET(20 MG) BY MOUTH DAILY  . spironolactone (ALDACTONE) 25 MG tablet Take 0.5 tablets (12.5 mg total) by mouth daily.       Allergies:       Allergies   Allergen Reactions  . Levaquin [Levofloxacin Hemihydrate] Itching and Rash  . Penicillins Itching, Rash and Other (See Comments)    Has patient had a PCN reaction causing immediate rash, facial/tongue/throat swelling, SOB or lightheadedness with hypotension: Unknown  Has patient had a PCN reaction causing severe rash involving mucus membranes or skin necrosis: No  Has patient had a PCN reaction that required hospitalization: No  Has patient had a PCN reaction occurring within the last 10 years: No  If all of the above answers are "NO", then may proceed with Cephalosporin use.    Social History:  The patient reports that he has never smoked. He has never used smokeless tobacco. He reports current alcohol use. He reports that he does not use drugs.   Family History:  The patient's family history includes Cancer in his father; Heart attack in his mother.   Review of Systems:  Please see the history of present illness. All other systems are reviewed and negative.   Physical Exam:  VS: BP 110/62  Pulse 79  Ht 5\' 10"  (1.778 m)  Wt 189 lb 12.8 oz (86.1 kg)  SpO2 97%  BMI 27.23 kg/m . BMI Body mass index is 27.23 kg/m.     Wt Readings from Last 3 Encounters:  11/01/19 189 lb 12.8 oz (86.1 kg)  10/20/19 200 lb (90.7 kg)  09/29/19 209 lb (94.8 kg)    General: Pleasant. Alert and in no acute distress. His weight is down 11 pounds.  HEENT: Normal.  Neck: Supple, no JVD, carotid bruits, or masses noted.  Cardiac: Regular rate and rhythm. No murmurs, rubs, or gallops. No edema.  Respiratory: Lungs are clear to auscultation bilaterally with normal work of breathing.  GI: Soft and nontender.  MS: No deformity or atrophy. Gait and ROM intact.  Skin: Warm and dry. Color is normal.  Neuro: Strength and sensation are intact and no gross focal deficits noted.  Psych: Alert, appropriate and with normal affect.    LABORATORY DATA:   EKG: EKG is not ordered today.  Lab Results  Component Value Date   WBC 6.4 04/24/2018   HGB 11.1 (L) 04/24/2018   HCT 34.3 (L) 04/24/2018   PLT 228 04/24/2018   GLUCOSE 94 10/28/2019   CHOL 147 06/04/2019   TRIG 122 06/04/2019   HDL 38 (L) 06/04/2019   LDLDIRECT 98.0 04/21/2015   LDLCALC 85 06/04/2019   ALT 31 06/04/2019   AST 34 06/04/2019   NA 139 10/28/2019   K 4.6 10/28/2019   CL 101 10/28/2019   CREATININE 1.18 10/28/2019   BUN 28 (H) 10/28/2019   CO2 25 10/28/2019   INR 1.40 02/25/2018   HGBA1C 5.5 02/24/2018        BNP (last 3 results)  Recent Labs (within last 365 days)    ProBNP (last 3 results)  Recent Labs (within last 365 days)      Other Studies Reviewed Today:   ECHO TEE OR 02/2018  Result status: Final result  Septum: No Patent Foramen Ovale present.  Left atrium: Patent foramen ovale not present.  Left atrium: Cavity is mildly dilated.  Aortic valve: No stenosis. Trace regurgitation.  Mitral valve: Mild regurgitation.  Right ventricle: Normal cavity size, wall thickness and ejection fraction.  Tricuspid valve: Trace regurgitation. The tricuspid valve regurgitation jet is central.   ASSESSMENT & PLAN:   1. Acute on chronic diastolic HF - he has had Aldactone added to his regimen - lab from Friday looked good - weight is down - doing much better with salt restriction. Will hold on getting echo - if he were to start having recurrences - would get this updated.  2. CAD with prior CABG - no active chest pain - back on track with CV risk factor modification.  3. HLD - on statin.  4. PAF - post op - in sinus by exam.  5. Parkinsons - he has resumed his exercises for this as well.  6. COVID-19 Education:  The signs and symptoms of COVID-19 were discussed with the patient and how to seek care for testing (follow up with PCP or  arrange E-visit). The importance of social distancing, staying at home, hand hygiene and wearing a mask when out in public were discussed today.   Current medicines are reviewed with the patient today. The patient does not have concerns regarding medicines other than what has been noted above.   The following changes have been made: See above.  Labs/ tests ordered today include:   No orders of the defined types were placed in this encounter.  Disposition: FU with [place] in about 6 weeks. Recheck BMET on return. Continue with salt restriction.   Patient is agreeable to this plan and will call if any problems develop in the interim.   SignedTruitt Merle, NP  11/01/2019 1:10 PM  [facility]  [address] [place]  [place], [place] [address]  Phone: [phone]  Fax: [phone]

## 2019-11-01 NOTE — Patient Instructions (Addendum)
After Visit Summary:  We will be checking the following labs today - NONE   Medication Instructions:    Continue with your current medicines.    If you need a refill on your cardiac medications before your next appointment, please call your pharmacy.     Testing/Procedures To Be Arranged:  N/A  Follow-Up:   See me in about 6 weeks. We will recheck lab when you come back - you do not need to fast.     At [facility], you and your health needs are our priority.  As part of our continuing mission to provide you with exceptional heart care, we have created designated Provider Care Teams.  These Care Teams include your primary Cardiologist (physician) and Advanced Practice Providers (APPs -  Physician Assistants and Nurse Practitioners) who all work together to provide you with the care you need, when you need it.  Special Instructions:  . Stay safe, stay home, wash your hands for at least 20 seconds and wear a mask when out in public.  . It was good to talk with you today.  Marland Kitchen Keep up the walking . Keep restricting the salt.    Call the [facility] office at [phone] if you have any questions, problems or concerns.

## 2019-11-04 DIAGNOSIS — G20 Parkinson's disease: Secondary | ICD-10-CM | POA: Diagnosis not present

## 2019-11-29 DIAGNOSIS — I10 Essential (primary) hypertension: Secondary | ICD-10-CM | POA: Diagnosis not present

## 2019-11-29 DIAGNOSIS — E7800 Pure hypercholesterolemia, unspecified: Secondary | ICD-10-CM | POA: Diagnosis not present

## 2019-11-29 DIAGNOSIS — F320 Major depressive disorder, single episode, mild: Secondary | ICD-10-CM | POA: Diagnosis not present

## 2019-11-29 DIAGNOSIS — I2510 Atherosclerotic heart disease of native coronary artery without angina pectoris: Secondary | ICD-10-CM | POA: Diagnosis not present

## 2019-11-29 DIAGNOSIS — G20 Parkinson's disease: Secondary | ICD-10-CM | POA: Diagnosis not present

## 2019-12-10 NOTE — Progress Notes (Signed)
CARDIOLOGY OFFICE NOTE  Date:  12/13/2019    [name] Date of Birth: Aug 26, 1941 Medical Record [id number]  PCP:  Shirline Frees, MD  Cardiologist:  Jennings Books  Chief Complaint  Patient presents with  . Follow-up    Seen for Dr. Tamala Julian    History of Present Illness: JEDEDIAH AL is a 79 y.o. male who presents today for a 6 week check. Seen for Dr. Tamala Julian.   He has a history of CAD with prior CABG with LIMA to LAD and SVG to OM for left main disease (April 2019), hyperlipidemia, Parkinson's disease, postop atrial fibrillation, HTN, and diastolic heart failure in the setting of acute ischemia caused by left main disease. 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   He was seen in early November- was doing ok - then called back 2 weeks later with increasing weight and worsening orthopnea/chest tightness/cough and sodium indiscretion. Diuretics were increased. Aldactone added. I then saw him 6 weeks ago - was improved.   The patient does not have symptoms concerning for COVID-19 infection (fever, chills, cough, or new shortness of breath).   Comes in today. Here with his wife. They thought they were seeing Dr. Tamala Julian today. Asking about starting Zoloft but worried about taking this with his Parkinson's - his PCP had recommended this. Asking about the COVID vaccine - scheduled for later this week. He admits he has some memory issues. His weight is stable at home - no swelling - she says it is actually down at home. Still using some salt. Has not been walking as much as he had been - due to some back pain. No chest pain.   Past Medical History:  Diagnosis Date  . Acute diastolic (congestive) heart failure ([place]) 02/13/2018  . Arthritis   . 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 CAD (coronary artery disease) 02/24/2018  . CAD in native artery 04/26/2015   2011 Multivessel DES PCI LAD,RCA PLOM and distal RCA   . Coronary artery disease    . Dementia ([place])   . Essential hypertension 04/21/2014  . Hyperlipidemia   . Parkinson's disease ([place]) 04/27/2015   2016   . Paroxysmal atrial fibrillation ([place]) 03/17/2018  . 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 Stented coronary artery 2011   x3    Past Surgical History:  Procedure Laterality Date  . APPENDECTOMY    . CARDIAC CATHETERIZATION  2011   3 stents  . COLONOSCOPY    . CORONARY ARTERY BYPASS GRAFT N/A 02/25/2018   Procedure: CORONARY ARTERY BYPASS GRAFTING (CABG), ON PUMP, TIMES TWO, USING LEFT INTERNAL MAMMARY ARTERY AND ENDOSCOPICALLY HARVESTED RIGHT GREATER SAPHENOUS VEIN;  Surgeon: Rexene Alberts, MD;  Location: [facility];  Service: Open Heart Surgery;  Laterality: N/A;  . RIGHT/LEFT HEART CATH AND CORONARY ANGIOGRAPHY N/A 02/24/2018   Procedure: RIGHT/LEFT HEART CATH AND CORONARY ANGIOGRAPHY;  Surgeon: Belva Crome, MD;  Location: [facility];  Service: Cardiovascular;  Laterality: N/A;  . TEE WITHOUT CARDIOVERSION N/A 02/25/2018   Procedure: TRANSESOPHAGEAL ECHOCARDIOGRAM (TEE);  Surgeon: Rexene Alberts, MD;  Location: [facility];  Service: Open Heart Surgery;  Laterality: N/A;  . TONSILLECTOMY       Medications: Current Meds  Medication Sig  . aspirin EC 81 MG tablet Take 1 tablet (81 mg total) by mouth daily.  Marland Kitchen donepezil (ARICEPT) 5 MG tablet Take  1 tablet by mouth daily.  . furosemide (LASIX) 40 MG tablet TAKE 1 TABLET BY MOUTH EVERY DAY  . Melatonin 5 MG CAPS Take 1 capsule by mouth as needed (sleep).  . metoprolol tartrate (LOPRESSOR) 25 MG tablet Take 0.5 tablets (12.5 mg total) by mouth 2 (two) times daily.  . nitroGLYCERIN (NITROSTAT) 0.4 MG SL tablet Place 1 tablet (0.4 mg total) under the tongue every 5 (five) minutes as needed for chest pain.  . rosuvastatin (CRESTOR) 20 MG tablet TAKE 1 TABLET(20 MG) BY MOUTH DAILY  . sertraline (ZOLOFT) 25 MG tablet Take 25 mg by mouth daily.  Marland Kitchen spironolactone (ALDACTONE) 25 MG tablet  Take 0.5 tablets (12.5 mg total) by mouth daily.     Allergies: Allergies  Allergen Reactions  . Levaquin [Levofloxacin Hemihydrate] Itching and Rash  . Penicillins Itching, Rash and Other (See Comments)    Has patient had a PCN reaction causing immediate rash, facial/tongue/throat swelling, SOB or lightheadedness with hypotension: Unknown Has patient had a PCN reaction causing severe rash involving mucus membranes or skin necrosis: No Has patient had a PCN reaction that required hospitalization: No Has patient had a PCN reaction occurring within the last 10 years: No If all of the above answers are "NO", then may proceed with Cephalosporin use.     Social History: The patient  reports that he has never smoked. He has never used smokeless tobacco. He reports current alcohol use. He reports that he does not use drugs.   Family History: The patient's family history includes Cancer in his father; Heart attack in his mother.   Review of Systems: Please see the history of present illness.   All other systems are reviewed and negative.   Physical Exam: VS:  BP 134/76   Pulse 69   Ht 5\' 10"  (1.778 m)   Wt 198 lb (89.8 kg)   SpO2 97%   BMI 28.41 kg/m  .  BMI Body mass index is 28.41 kg/m.  Wt Readings from Last 3 Encounters:  12/13/19 198 lb (89.8 kg)  11/01/19 189 lb 12.8 oz (86.1 kg)  10/20/19 200 lb (90.7 kg)    General: Alert and in no acute distress. His weight is back up.   HEENT: Normal.  Neck: Supple, no JVD, carotid bruits, or masses noted.  Cardiac: Regular rate and rhythm. No murmurs, rubs, or gallops. No edema.  Respiratory:  Lungs are clear to auscultation bilaterally with normal work of breathing.  GI: Soft and nontender.  MS: No deformity or atrophy. Gait and ROM intact.  Skin: Warm and dry. Color is normal.  Neuro:  Strength and sensation are intact and no gross focal deficits noted.  Psych: Alert, appropriate and with normal affect.   LABORATORY  DATA:  EKG:  EKG is not ordered today.  Lab Results  Component Value Date   WBC 6.4 04/24/2018   HGB 11.1 (L) 04/24/2018   HCT 34.3 (L) 04/24/2018   PLT 228 04/24/2018   GLUCOSE 94 10/28/2019   CHOL 147 06/04/2019   TRIG 122 06/04/2019   HDL 38 (L) 06/04/2019   LDLDIRECT 98.0 04/21/2015   LDLCALC 85 06/04/2019   ALT 31 06/04/2019   AST 34 06/04/2019   NA 139 10/28/2019   K 4.6 10/28/2019   CL 101 10/28/2019   CREATININE 1.18 10/28/2019   BUN 28 (H) 10/28/2019   CO2 25 10/28/2019   INR 1.40 02/25/2018   HGBA1C 5.5 02/24/2018     BNP (last 3 results)  No results for input(s): BNP in the last 8760 hours.  ProBNP (last 3 results) No results for input(s): PROBNP in the last 8760 hours.   Other Studies Reviewed Today:  ECHO TEE OR 02/2018  Result status: Final result   Septum: No Patent Foramen Ovale present.   Left atrium: Patent foramen ovale not present.   Left atrium: Cavity is mildly dilated.   Aortic valve: No stenosis. Trace regurgitation.   Mitral valve: Mild regurgitation.   Right ventricle: Normal cavity size, wall thickness and ejection fraction.   Tricuspid valve: Trace regurgitation. The tricuspid valve regurgitation jet is central.   ASSESSMENT & PLAN:    1. Chronic diastolic HF - weight is down at home - no changes made today - needs BMET today.   2. CAD with prior CABG - no active chest pain. Encouraged CV risk factor modification.   3. HTN - BP is ok.   4. Short term memory issues - on Aricept.   5. HLD - on statin  6. Post op PAF - remains in NSR by exam.   7. Parkinson's - per neurology - they are going to discuss the SSRI therapy with them.   8. COVID-19 Education: The signs and symptoms of COVID-19 were discussed with the patient and how to seek care for testing (follow up with PCP or arrange E-visit).  The importance of social distancing, staying at home, hand hygiene and wearing a mask when out in public were discussed today.  Getting COVID vaccine later this week.   Current medicines are reviewed with the patient today.  The patient does not have concerns regarding medicines other than what has been noted above.  The following changes have been made:  See above.  Labs/ tests ordered today include:    Orders Placed This Encounter  Procedures  . Basic metabolic panel     Disposition:   FU with Dr. Tamala Julian in a couple of months. I am happy to see back as needed.   Patient is agreeable to this plan and will call if any problems develop in the interim.   SignedTruitt Merle, NP  12/13/2019 11:38 AM  [facility] [address] [place], [place]   [address] Phone: [phone] Fax: [phone]

## 2019-12-13 ENCOUNTER — Other Ambulatory Visit: Payer: Self-pay

## 2019-12-13 ENCOUNTER — Ambulatory Visit (INDEPENDENT_AMBULATORY_CARE_PROVIDER_SITE_OTHER): Payer: Medicare Other | Admitting: Nurse Practitioner

## 2019-12-13 ENCOUNTER — Encounter: Payer: Self-pay | Admitting: Nurse Practitioner

## 2019-12-13 VITALS — BP 134/76 | HR 69 | Ht 70.0 in | Wt 198.0 lb

## 2019-12-13 DIAGNOSIS — I25810 Atherosclerosis of coronary artery bypass graft(s) without angina pectoris: Secondary | ICD-10-CM | POA: Diagnosis not present

## 2019-12-13 DIAGNOSIS — I10 Essential (primary) hypertension: Secondary | ICD-10-CM

## 2019-12-13 DIAGNOSIS — I5032 Chronic diastolic (congestive) heart failure: Secondary | ICD-10-CM | POA: Diagnosis not present

## 2019-12-13 DIAGNOSIS — Z7189 Other specified counseling: Secondary | ICD-10-CM | POA: Diagnosis not present

## 2019-12-13 DIAGNOSIS — I480 Paroxysmal atrial fibrillation: Secondary | ICD-10-CM

## 2019-12-13 LAB — BASIC METABOLIC PANEL
BUN/Creatinine Ratio: 22 (ref 10–24)
BUN: 26 mg/dL (ref 8–27)
CO2: 25 mmol/L (ref 20–29)
Calcium: 9.9 mg/dL (ref 8.6–10.2)
Chloride: 100 mmol/L (ref 96–106)
Creatinine, Ser: 1.19 mg/dL (ref 0.76–1.27)
GFR calc Af Amer: 67 mL/min/{1.73_m2} (ref >59)
GFR calc non Af Amer: 58 mL/min/{1.73_m2} — ABNORMAL LOW (ref >59)
Glucose: 97 mg/dL (ref 65–99)
Potassium: 4.7 mmol/L (ref 3.5–5.2)
Sodium: 140 mmol/L (ref 134–144)

## 2019-12-13 NOTE — Patient Instructions (Addendum)
After Visit Summary:  We will be checking the following labs today - BMET   Medication Instructions:    Continue with your current medicines.    If you need a refill on your cardiac medications before your next appointment, please call your pharmacy.     Testing/Procedures To Be Arranged:  N/A  Follow-Up:   See Dr. Tamala Julian in March.     At [facility], you and your health needs are our priority.  As part of our continuing mission to provide you with exceptional heart care, we have created designated Provider Care Teams.  These Care Teams include your primary Cardiologist (physician) and Advanced Practice Providers (APPs -  Physician Assistants and Nurse Practitioners) who all work together to provide you with the care you need, when you need it.  Special Instructions:  . Stay safe, stay home, wash your hands for at least 20 seconds and wear a mask when out in public.  . It was good to talk with you today.  . Try to stay as active as you can.    Call the [place] office at [phone] if you have any questions, problems or concerns.

## 2019-12-15 ENCOUNTER — Ambulatory Visit: Payer: Medicare Other | Attending: Internal Medicine

## 2019-12-15 DIAGNOSIS — Z23 Encounter for immunization: Secondary | ICD-10-CM

## 2019-12-29 IMAGING — DX DG CHEST 1V PORT
1 series · 1 of 1 positions shown · non-contrast
Comparison: Yesterday

CLINICAL DATA: Atelectasis

EXAM:
PORTABLE CHEST 1 VIEW

[chest ap]
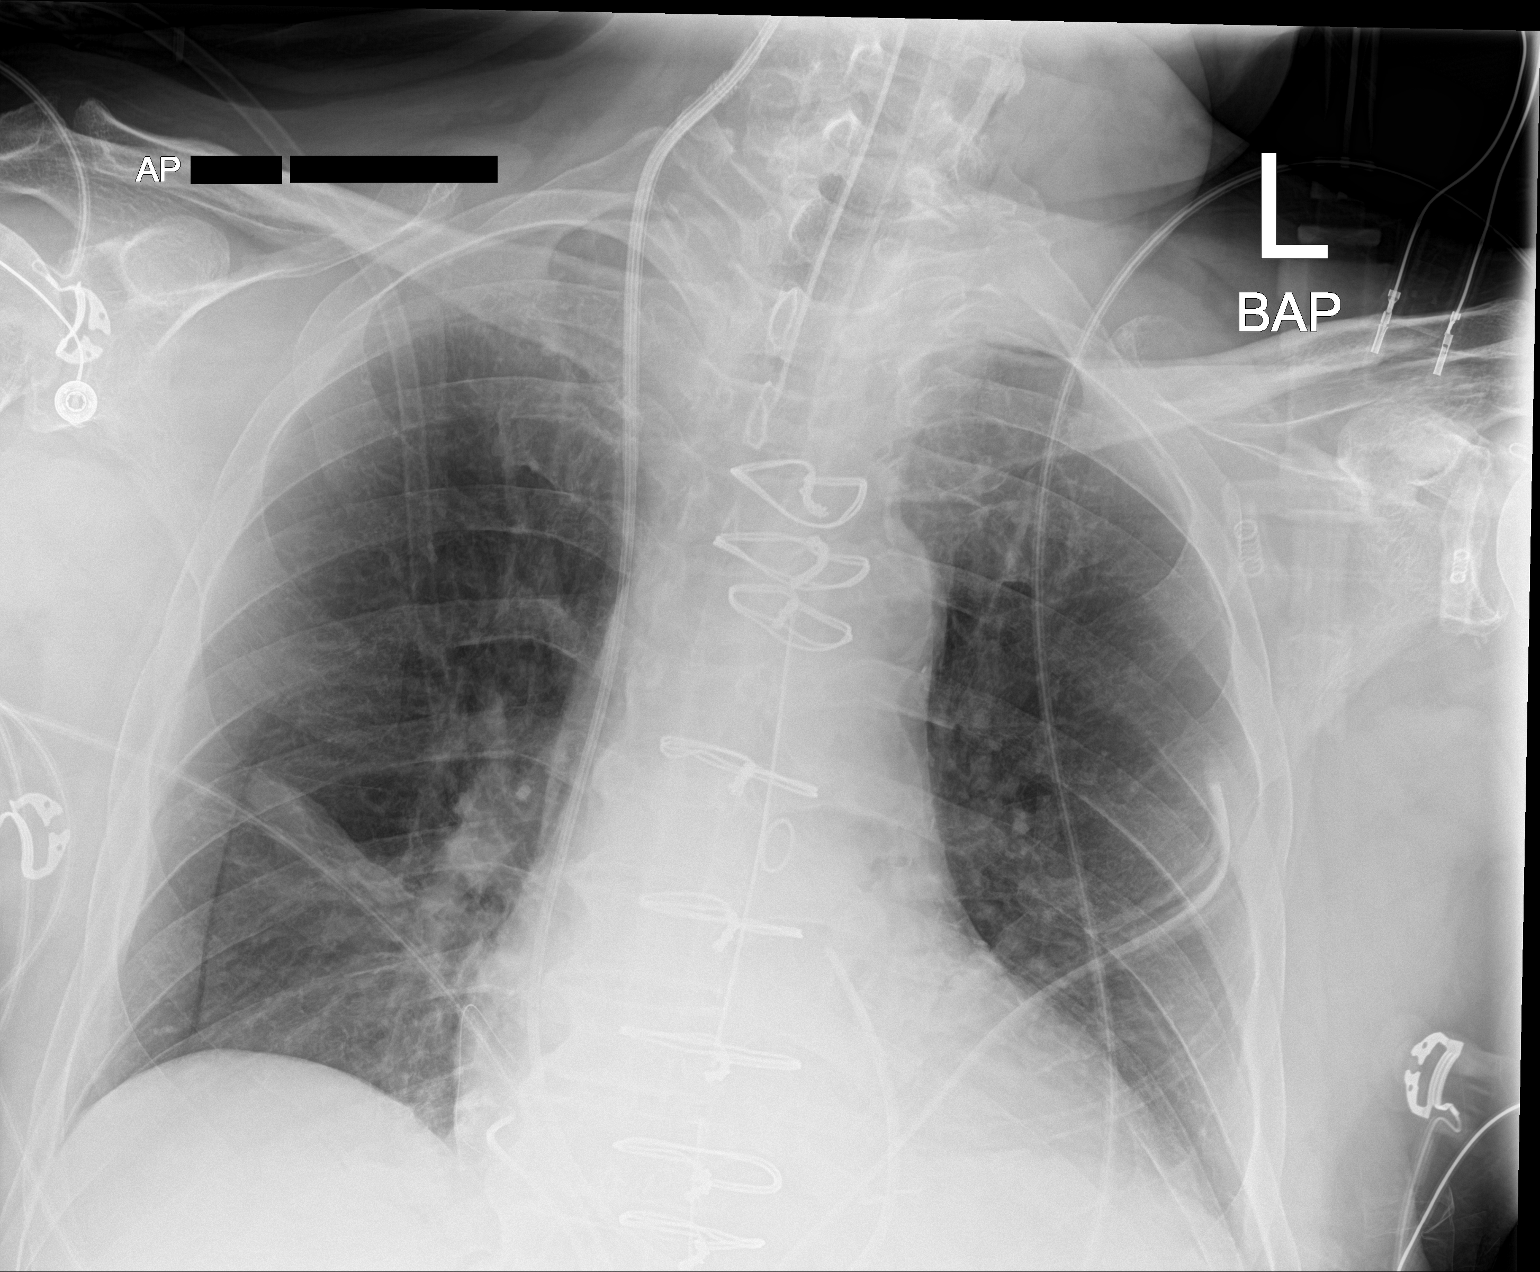

[1 of 1 positions shown; findings below may reference images not displayed]

FINDINGS: Endotracheal tube tip at the clavicular heads. Swan-Ganz catheter
via right IJ approach with tip at the pulmonary artery outflow
tract. Thoracic drains. Lower lung atelectasis, mainly on the right.
No Kerley lines, effusion, or pneumothorax. Interval changes of
CABG. Stable heart size.
IMPRESSION: 1. Mild atelectasis.  No visible pneumothorax.
2. Unremarkable tubes and central line.

## 2019-12-30 IMAGING — DX DG CHEST 1V PORT
1 series · 1 of 1 positions shown · non-contrast
Comparison: 02/25/2018.

CLINICAL DATA: CABG.  Atelectasis.

EXAM:
PORTABLE CHEST 1 VIEW

[chest ap]
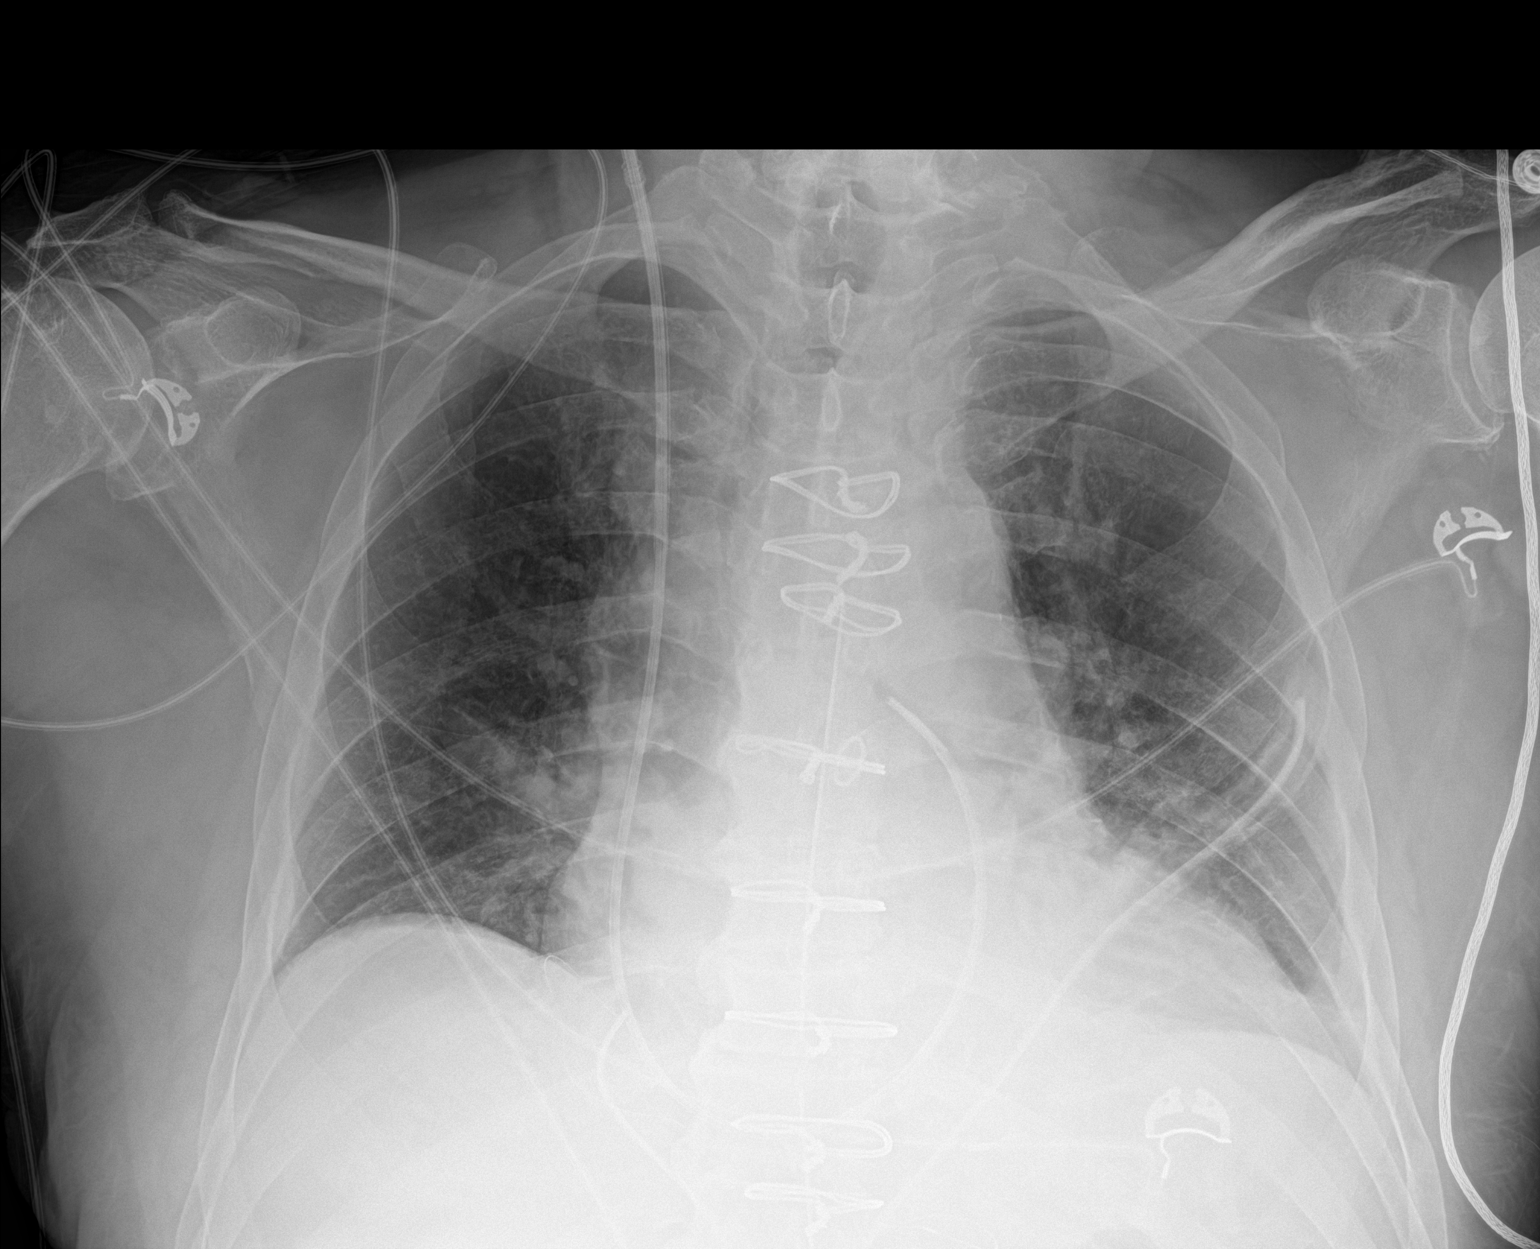

[1 of 1 positions shown; findings below may reference images not displayed]

FINDINGS: Patient is extubated. Corresponding low lung volumes. Swan-Ganz
catheter tip main pulmonary artery. LEFT chest tube. No
pneumothorax. Mediastinal tube.
IMPRESSION: Low lung volumes post extubation. No pneumothorax. Satisfactory
postoperative appearance.

## 2019-12-31 IMAGING — DX DG CHEST 1V PORT
1 series · 1 of 1 positions shown · non-contrast
Comparison: February 26, 2018

CLINICAL DATA: Status post coronary artery bypass grafting for
coronary artery disease. Hypertension.

EXAM:
PORTABLE CHEST 1 VIEW

[chest]
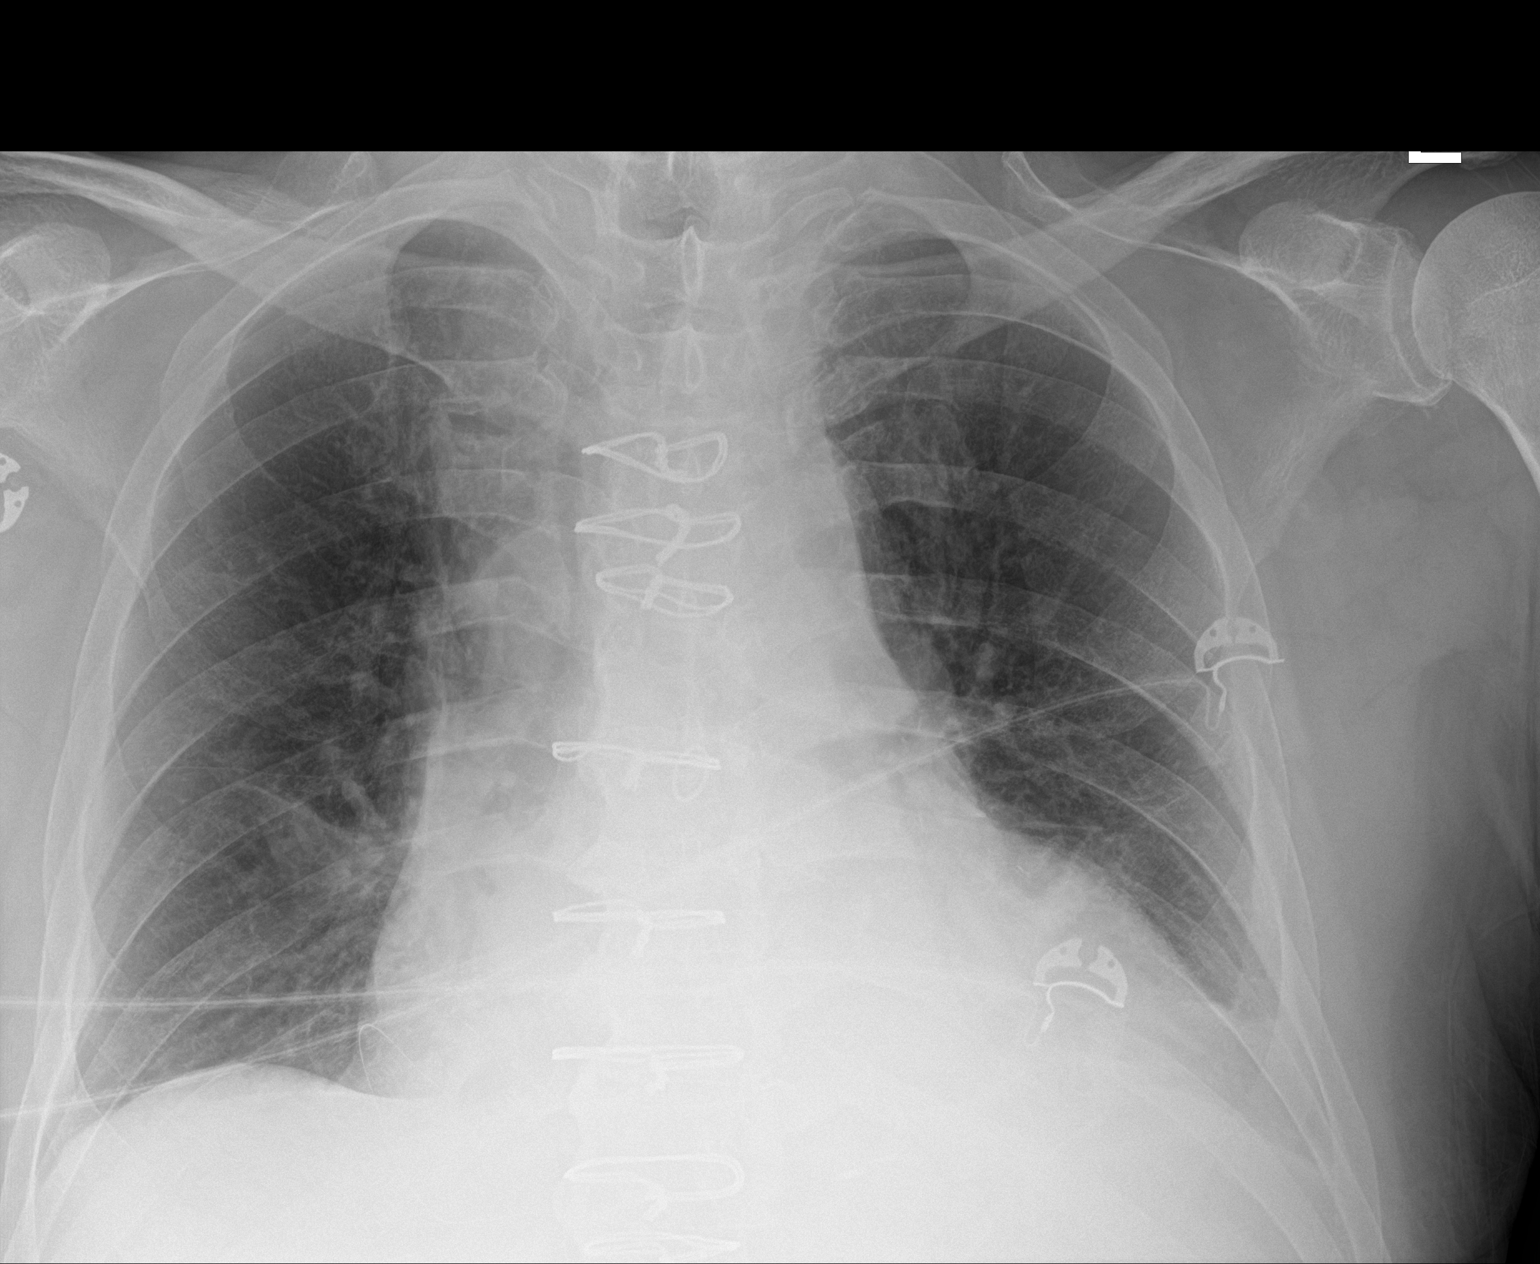

[1 of 1 positions shown; findings below may reference images not displayed]

FINDINGS: Swan-Ganz catheter, left chest tube, and mediastinal drain have been
removed. Temporary pacemaker wires are attached to the right heart.
No pneumothorax. There is airspace consolidation in both lower lobes
with small left pleural effusion. There is cardiomegaly. The
pulmonary vascularity is normal. No adenopathy. No bone lesions.
IMPRESSION: No pneumothorax. Airspace consolidation in both lower lobes
medially. Suspect atelectasis, although there may be superimposed
pneumonia in the lung bases. Small left pleural effusion. Lungs
elsewhere clear. Stable cardiomegaly.

## 2020-01-01 IMAGING — CR DG CHEST 2V
2 series · 2 of 2 positions shown · non-contrast
Comparison: 02/27/2018

CLINICAL DATA: Status post CABG.

EXAM:
CHEST - 2 VIEW

[chest lat]
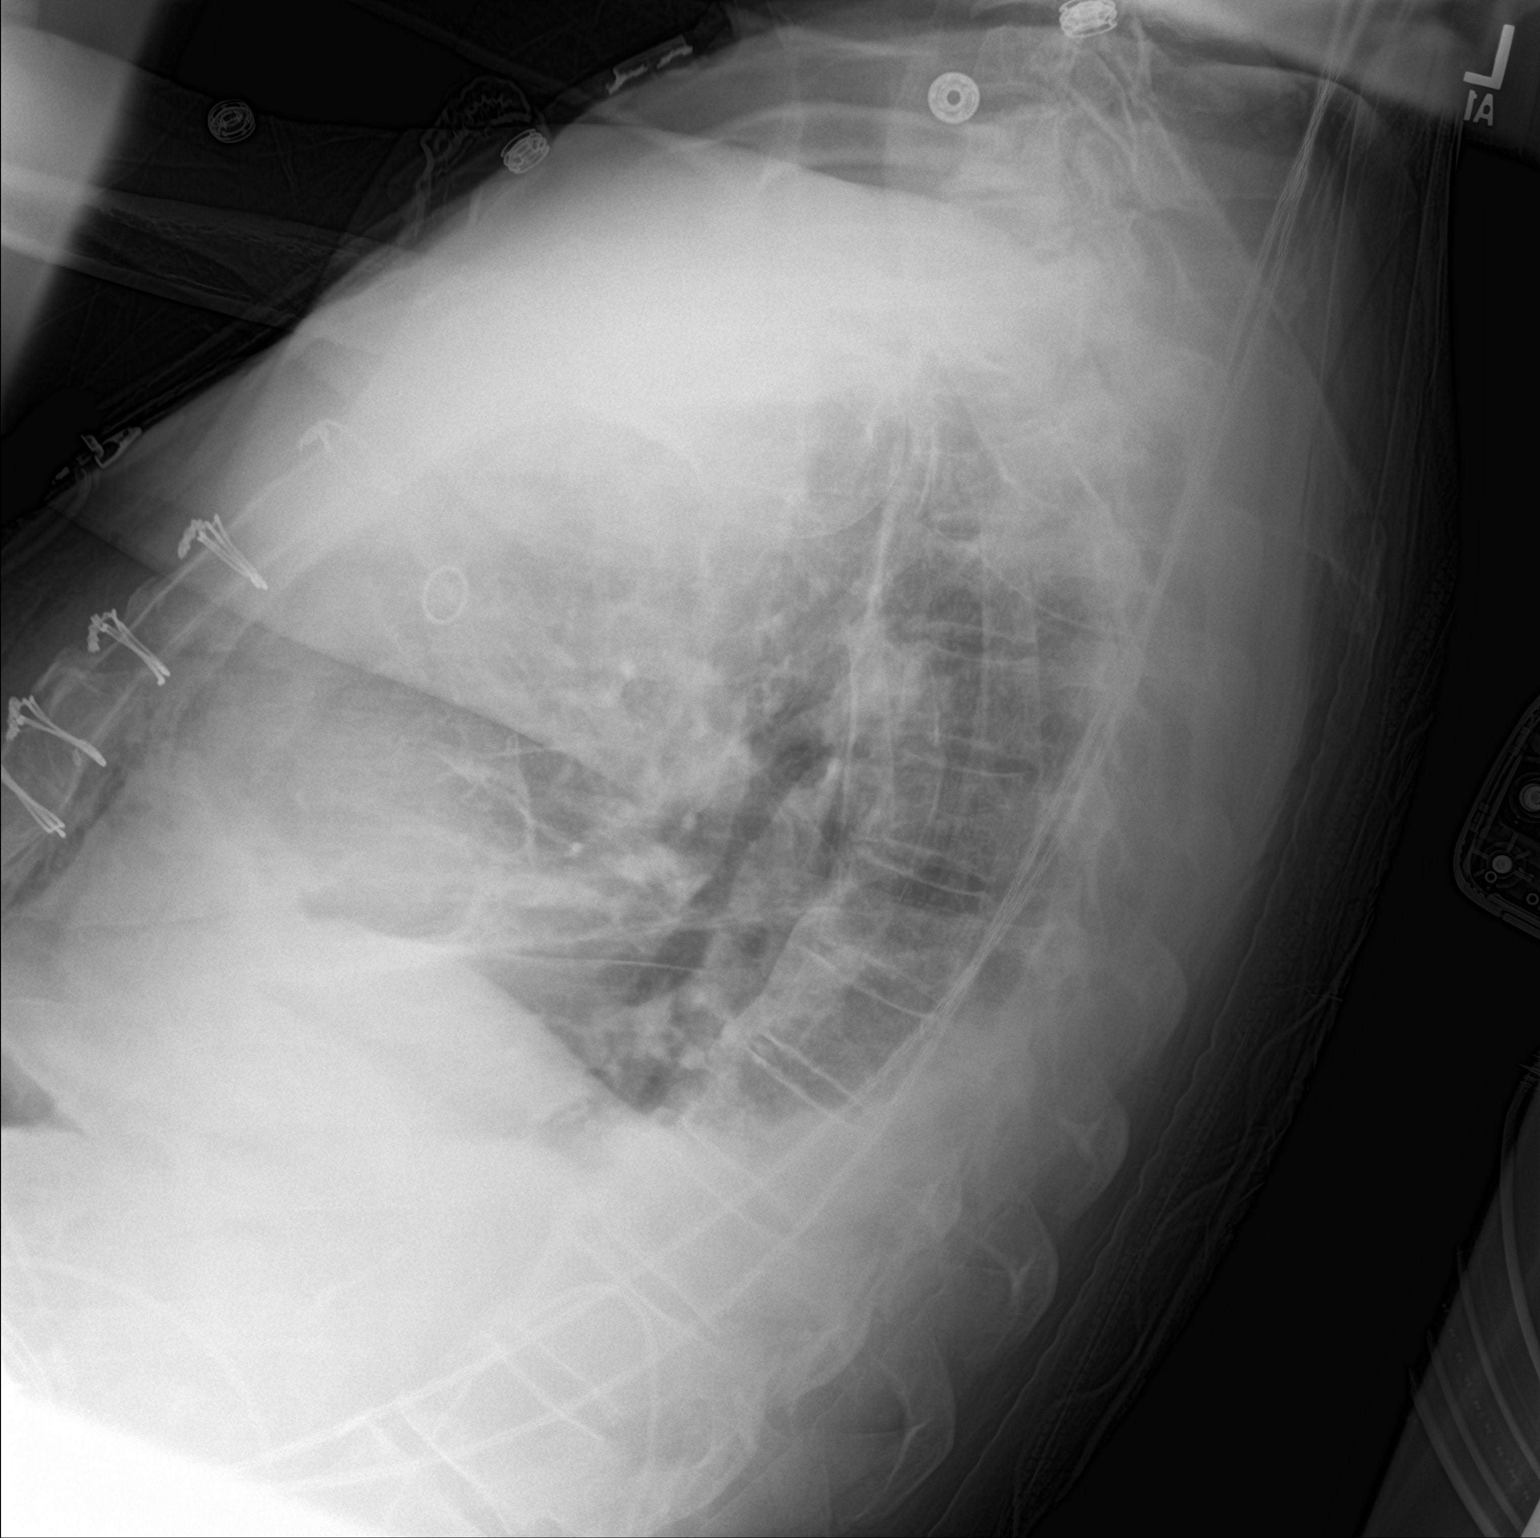

[chest ap]
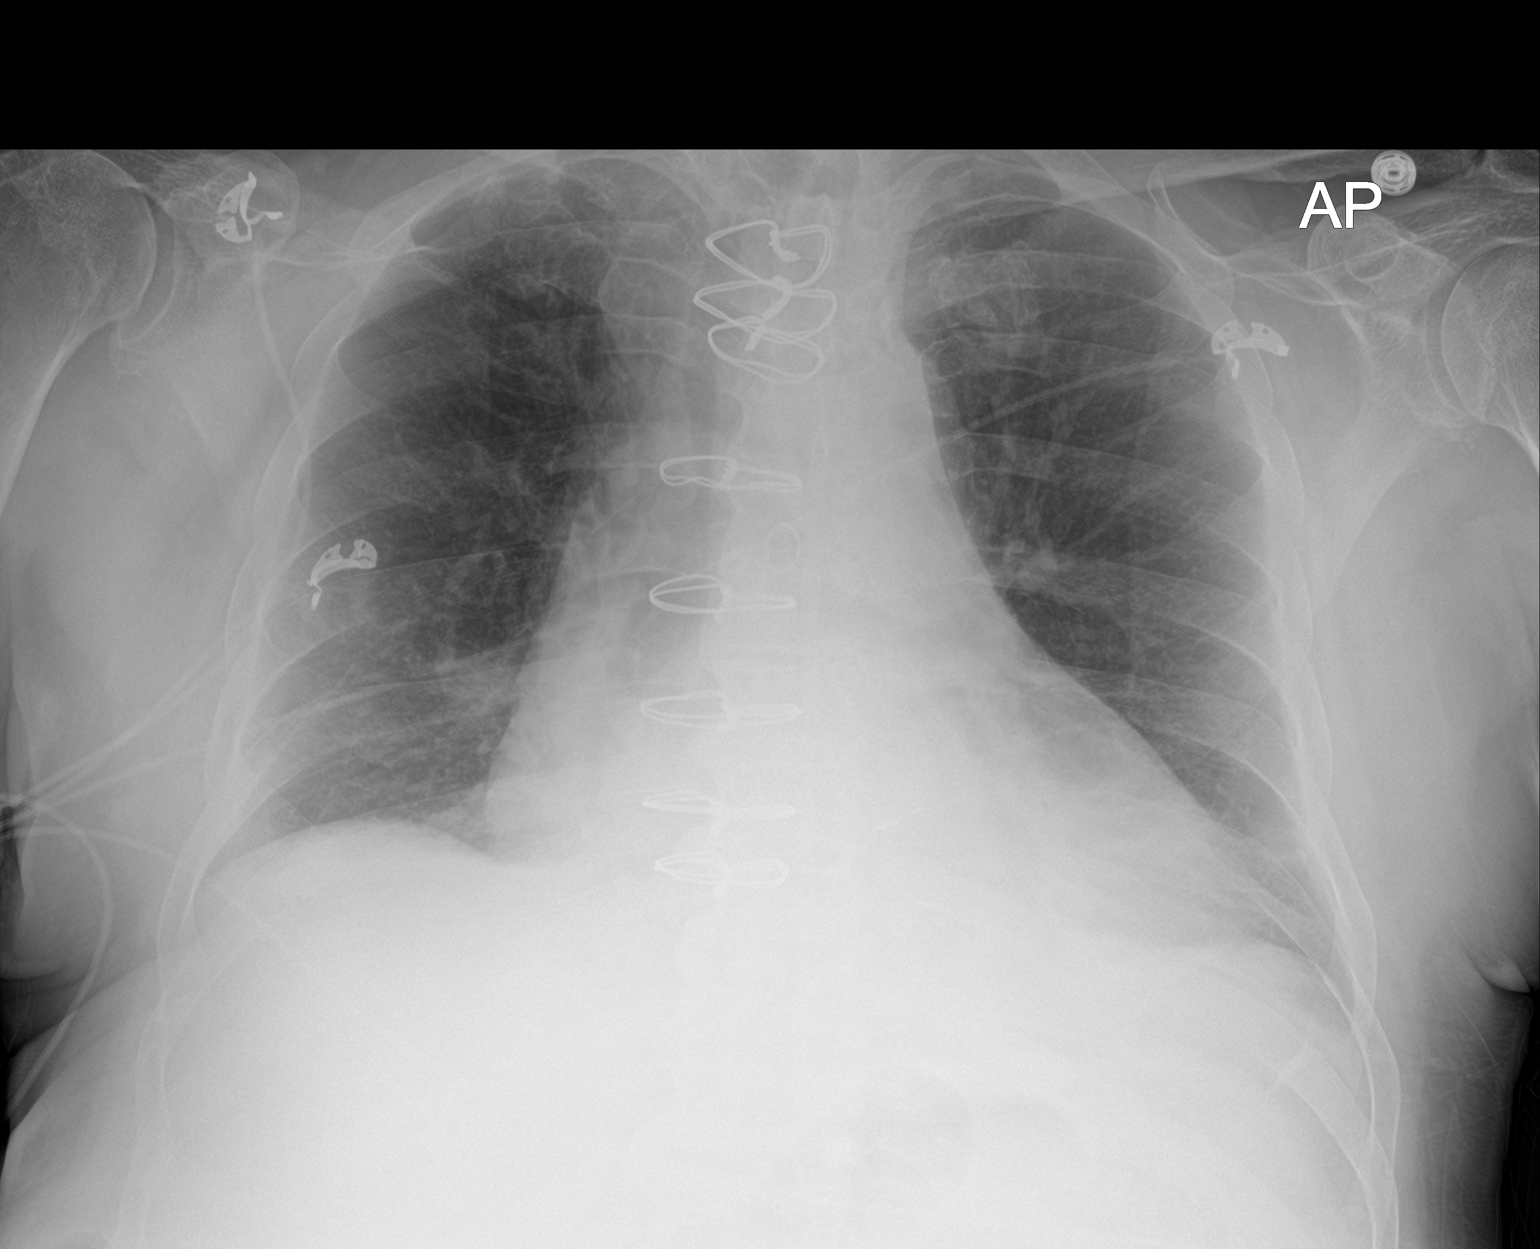

[2 of 2 positions shown; findings below may reference images not displayed]

FINDINGS: Stable heart size and mediastinal contours. Mild bibasilar
atelectasis present. There are small bilateral posterior pleural
effusions. No pulmonary edema, focal airspace consolidation or
pneumothorax identified.
IMPRESSION: Small bilateral pleural effusions. No pneumothorax. Bibasilar
atelectasis present.

## 2020-01-02 ENCOUNTER — Ambulatory Visit: Payer: Medicare Other | Attending: Internal Medicine

## 2020-01-02 DIAGNOSIS — Z23 Encounter for immunization: Secondary | ICD-10-CM | POA: Insufficient documentation

## 2020-01-02 NOTE — Progress Notes (Signed)
   Covid-19 Vaccination Clinic  Name:  Calvin Ramirez    MRN: [id number] DOB: 16-Apr-1941  01/02/2020  Mr. Calvin Ramirez was observed post Covid-19 immunization for 15 minutes without incidence. He was provided with Vaccine Information Sheet and instruction to access the V-Safe system.   Mr. Calvin Ramirez was instructed to call 911 with any severe reactions post vaccine: Marland Kitchen Difficulty breathing  . Swelling of your face and throat  . A fast heartbeat  . A bad rash all over your body  . Dizziness and weakness    Immunizations Administered    Name Date Dose VIS Date Route   Pfizer COVID-19 Vaccine 01/02/2020 10:07 AM 0.3 mL 11/05/2019 Intramuscular   Manufacturer: [place]   Lot: [id number]   [place]: [id number]

## 2020-01-17 ENCOUNTER — Telehealth: Payer: Self-pay

## 2020-01-17 NOTE — Telephone Encounter (Signed)
LMTCB... pt was started on Spironolactone 10/19/20 by Dr. Tamala Julian.... next OV 01/26/20.

## 2020-01-17 NOTE — Telephone Encounter (Signed)
I spoke to the patient's wife Gay Filler) who called to question whether the patient needed to be on both Furosemide 40 mg Daily along with Spironolactone 12.5 mg Daily.  She thought that the Spironolactone was to be temporary and was wondering if needed to be refilled.  He is feeling well with no weight gain, swelling or SOB.    He has an appointment with Dr Tamala Julian on 3/3 and they would like to change that.  The spouse works that day and she needs to come with the patient.

## 2020-01-17 NOTE — Telephone Encounter (Signed)
I spoke to the patient's wife and informed her that the patient should continue the Spironolactone 12.5 mg Daily and we will get a BMET at his 3/3 OV with Dr Tamala Julian.

## 2020-01-17 NOTE — Telephone Encounter (Signed)
Continue spironolactone. Make sure a basic metabolic panel is done at the office visit.

## 2020-01-17 NOTE — Telephone Encounter (Signed)
Patients wife called for refills on Spironolactone and Furosemide, but before these are refilled, she would like to know if patient needs to be on Spironolactone? She states she thought Spironolactone was to be a temporary addition to the Furosemide. Patients weight is staying down and he does not have any swelling.

## 2020-01-18 NOTE — Telephone Encounter (Signed)
Left message to call back  

## 2020-01-25 NOTE — Progress Notes (Signed)
Cardiology Office Note:    Date:  01/26/2020   ID:  Calvin Ramirez, DOB [birth year], MRN [id number]  PCP:  Shirline Frees, MD  Cardiologist:  Sinclair Grooms, MD   Referring MD: Shirline Frees, MD   Chief Complaint  Patient presents with  . Coronary Artery Disease  . Congestive Heart Failure    History of Present Illness:    Calvin Ramirez is a 79 y.o. male with a hx of CAD, recent CABG with LIMA to LAD and SVG to OM for left main disease(April 2018), hyperlipidemia, Parkinson's disease, postop atrial fibrillation, essential hypertension, and diastolic heart failure in the setting of acute ischemia caused by left main disease.Recent acute on chronic combined systolic and diastolic HF treated with diuretics.  Calvin Ramirez is accompanied by his wife.  She points out his cognitive/memory impairment.  She feels it is getting worse.  He complains that he urinates a lot.  Breathing has not been an issue.  He denies orthopnea, PND, and significant lower extremity swelling.  He denies angina pectoris.  No nitroglycerin use.  He has not had palpitations or syncope.  Past Medical History:  Diagnosis Date  . Acute diastolic (congestive) heart failure ([place]) 02/13/2018  . Arthritis   . 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 CAD (coronary artery disease) 02/24/2018  . CAD in native artery 04/26/2015   2011 Multivessel DES PCI LAD,RCA PLOM and distal RCA   . Coronary artery disease   . Dementia ([place])   . Essential hypertension 04/21/2014  . Hyperlipidemia   . Parkinson's disease ([place]) 04/27/2015   2016   . Paroxysmal atrial fibrillation ([place]) 03/17/2018  . 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 Stented coronary artery 2011   x3    Past Surgical History:  Procedure Laterality Date  . APPENDECTOMY    . CARDIAC CATHETERIZATION  2011   3 stents  . COLONOSCOPY    . CORONARY ARTERY BYPASS GRAFT N/A 02/25/2018   Procedure: CORONARY ARTERY  BYPASS GRAFTING (CABG), ON PUMP, TIMES TWO, USING LEFT INTERNAL MAMMARY ARTERY AND ENDOSCOPICALLY HARVESTED RIGHT GREATER SAPHENOUS VEIN;  Surgeon: Rexene Alberts, MD;  Location: [facility];  Service: Open Heart Surgery;  Laterality: N/A;  . RIGHT/LEFT HEART CATH AND CORONARY ANGIOGRAPHY N/A 02/24/2018   Procedure: RIGHT/LEFT HEART CATH AND CORONARY ANGIOGRAPHY;  Surgeon: Belva Crome, MD;  Location: [facility];  Service: Cardiovascular;  Laterality: N/A;  . TEE WITHOUT CARDIOVERSION N/A 02/25/2018   Procedure: TRANSESOPHAGEAL ECHOCARDIOGRAM (TEE);  Surgeon: Rexene Alberts, MD;  Location: [facility];  Service: Open Heart Surgery;  Laterality: N/A;  . TONSILLECTOMY      Current Medications: Current Meds  Medication Sig  . aspirin EC 81 MG tablet Take 1 tablet (81 mg total) by mouth daily.  Marland Kitchen donepezil (ARICEPT) 5 MG tablet Take 1 tablet by mouth daily.  . furosemide (LASIX) 40 MG tablet Take 1 tablet (40 mg total) by mouth daily.  Marland Kitchen losartan (COZAAR) 50 MG tablet Take 1 tablet (50 mg total) by mouth daily.  . Melatonin 5 MG CAPS Take 1 capsule by mouth as needed (sleep).  . metoprolol tartrate (LOPRESSOR) 25 MG tablet Take 0.5 tablets (12.5 mg total) by mouth 2 (two) times daily.  . nitroGLYCERIN (NITROSTAT) 0.4 MG SL tablet Place 1 tablet (0.4 mg total) under the tongue every 5 (five) minutes as needed for chest pain.  Marland Kitchen  rosuvastatin (CRESTOR) 20 MG tablet TAKE 1 TABLET(20 MG) BY MOUTH DAILY  . sertraline (ZOLOFT) 25 MG tablet Take 25 mg by mouth daily.  Marland Kitchen spironolactone (ALDACTONE) 25 MG tablet Take 0.5 tablets (12.5 mg total) by mouth daily.  . [DISCONTINUED] furosemide (LASIX) 40 MG tablet TAKE 1 TABLET BY MOUTH EVERY DAY  . [DISCONTINUED] spironolactone (ALDACTONE) 25 MG tablet Take 0.5 tablets (12.5 mg total) by mouth daily.     Allergies:   Levaquin [levofloxacin hemihydrate] and Penicillins   Social History   Socioeconomic History  . Marital status: Married    Spouse name: Not  on file  . Number of children: Not on file  . Years of education: Not on file  . Highest education level: Not on file  Occupational History  . Not on file  Tobacco Use  . Smoking status: Never Smoker  . Smokeless tobacco: Never Used  Substance and Sexual Activity  . Alcohol use: Yes    Alcohol/week: 0.0 standard drinks    Comment: occ  . Drug use: No  . Sexual activity: Not on file  Other Topics Concern  . Not on file  Social History Narrative  . Not on file   Social Determinants of Health   Financial Resource Strain:   . Difficulty of Paying Living Expenses: Not on file  Food Insecurity:   . Worried About Charity fundraiser in the Last Year: Not on file  . Ran Out of Food in the Last Year: Not on file  Transportation Needs:   . Lack of Transportation (Medical): Not on file  . Lack of Transportation (Non-Medical): Not on file  Physical Activity:   . Days of Exercise per Week: Not on file  . Minutes of Exercise per Session: Not on file  Stress:   . Feeling of Stress : Not on file  Social Connections:   . Frequency of Communication with Friends and Family: Not on file  . Frequency of Social Gatherings with Friends and Family: Not on file  . Attends Religious Services: Not on file  . Active Member of Clubs or Organizations: Not on file  . Attends Archivist Meetings: Not on file  . Marital Status: Not on file     Family History: The patient's family history includes Cancer in his father; Heart attack in his mother.  ROS:   Please see the history of present illness.    Frequent urination otherwise no complaints.  His wife notes that he will not exercise.  Lifestyle is sedentary.  All other systems reviewed and are negative.  EKGs/Labs/Other Studies Reviewed:    The following studies were reviewed today: No new data  EKG:  EKG is not repeated on today's visit  Recent Labs: 06/04/2019: ALT 31 12/13/2019: BUN 26; Creatinine, Ser 1.19; Potassium 4.7;  Sodium 140  Recent Lipid Panel    Component Value Date/Time   CHOL 147 06/04/2019 0739   TRIG 122 06/04/2019 0739   HDL 38 (L) 06/04/2019 0739   CHOLHDL 3.9 06/04/2019 0739   CHOLHDL 6.2 (H) 09/06/2016 0952   VLDL 57 (H) 09/06/2016 0952   LDLCALC 85 06/04/2019 0739   LDLDIRECT 98.0 04/21/2015 0858    Physical Exam:    VS:  BP 98/62   Pulse 73   Ht 5\' 10"  (1.778 m)   Wt 200 lb 3.2 oz (90.8 kg)   SpO2 96%   BMI 28.73 kg/m     Wt Readings from Last 3 Encounters:  01/26/20  200 lb 3.2 oz (90.8 kg)  12/13/19 198 lb (89.8 kg)  11/01/19 189 lb 12.8 oz (86.1 kg)     GEN: Appears healthy.  Weight appears stable.  He is up approximately 10 pounds compared to early December.. No acute distress HEENT: Normal NECK: No JVD. LYMPHATICS: No lymphadenopathy CARDIAC:  RRR without murmur, gallop, or edema. VASCULAR:  Normal Pulses. No bruits. RESPIRATORY:  Clear to auscultation without rales, wheezing or rhonchi  ABDOMEN: Soft, non-tender, non-distended, No pulsatile mass, MUSCULOSKELETAL: No deformity  SKIN: Warm and dry NEUROLOGIC:  Alert and oriented x 3 PSYCHIATRIC:  Normal affect   ASSESSMENT:    1. Chronic diastolic CHF (congestive heart failure) ([place])   2. Coronary artery disease involving coronary bypass graft of native heart without angina pectoris   3. Paroxysmal atrial fibrillation ([facility])   4. Essential hypertension   5. Educated about COVID-19 virus infection    PLAN:    In order of problems listed above:  1. Volume status is stable. 2. Secondary prevention discussed. 3. No recurrences of atrial fibrillation. 4. Blood pressure repeated is 122/78 with the patient sitting. 5. COVID-19 vaccine has been received.  Social distancing and mask wearing is being practiced.  Overall education and awareness concerning primary/secondary risk prevention was discussed in detail: LDL less than 70, hemoglobin A1c less than 7, blood pressure target less than 130/80 mmHg, >150  minutes of moderate aerobic activity per week, avoidance of smoking, weight control (via diet and exercise), and continued surveillance/management of/for obstructive sleep apnea.    Medication Adjustments/Labs and Tests Ordered: Current medicines are reviewed at length with the patient today.  Concerns regarding medicines are outlined above.  No orders of the defined types were placed in this encounter.  Meds ordered this encounter  Medications  . spironolactone (ALDACTONE) 25 MG tablet    Sig: Take 0.5 tablets (12.5 mg total) by mouth daily.    Dispense:  45 tablet    Refill:  3  . furosemide (LASIX) 40 MG tablet    Sig: Take 1 tablet (40 mg total) by mouth daily.    Dispense:  90 tablet    Refill:  3    Patient Instructions  Medication Instructions:  Your physician recommends that you continue on your current medications as directed. Please refer to the Current Medication list given to you today.  *If you need a refill on your cardiac medications before your next appointment, please call your pharmacy*   Lab Work: None If you have labs (blood work) drawn today and your tests are completely normal, you will receive your results only by: Marland Kitchen MyChart Message (if you have MyChart) OR . A paper copy in the mail If you have any lab test that is abnormal or we need to change your treatment, we will call you to review the results.   Testing/Procedures: None   Follow-Up: At [facility], you and your health needs are our priority.  As part of our continuing mission to provide you with exceptional heart care, we have created designated Provider Care Teams.  These Care Teams include your primary Cardiologist (physician) and Advanced Practice Providers (APPs -  Physician Assistants and Nurse Practitioners) who all work together to provide you with the care you need, when you need it.  We recommend signing up for the patient portal called "MyChart".  Sign up information is provided on  this After Visit Summary.  MyChart is used to connect with patients for Virtual Visits (Telemedicine).  Patients  are able to view lab/test results, encounter notes, upcoming appointments, etc.  Non-urgent messages can be sent to your provider as well.   To learn more about what you can do with MyChart, go to [email or web address].    Your next appointment:   6-8 month(s)  The format for your next appointment:   In Person  Provider:   You may see Sinclair Grooms, MD or one of the following Advanced Practice Providers on your designated Care Team:    Truitt Merle, NP  Cecilie Kicks, NP  Kathyrn Drown, NP    Other Instructions      Signed, Sinclair Grooms, MD  01/26/2020 1:13 PM    [place]

## 2020-01-26 ENCOUNTER — Other Ambulatory Visit: Payer: Self-pay

## 2020-01-26 ENCOUNTER — Ambulatory Visit (INDEPENDENT_AMBULATORY_CARE_PROVIDER_SITE_OTHER): Payer: Medicare Other | Admitting: Interventional Cardiology

## 2020-01-26 ENCOUNTER — Encounter: Payer: Self-pay | Admitting: Interventional Cardiology

## 2020-01-26 VITALS — BP 98/62 | HR 73 | Ht 70.0 in | Wt 200.2 lb

## 2020-01-26 DIAGNOSIS — I10 Essential (primary) hypertension: Secondary | ICD-10-CM | POA: Diagnosis not present

## 2020-01-26 DIAGNOSIS — I25810 Atherosclerosis of coronary artery bypass graft(s) without angina pectoris: Secondary | ICD-10-CM | POA: Diagnosis not present

## 2020-01-26 DIAGNOSIS — I5032 Chronic diastolic (congestive) heart failure: Secondary | ICD-10-CM | POA: Diagnosis not present

## 2020-01-26 DIAGNOSIS — Z7189 Other specified counseling: Secondary | ICD-10-CM | POA: Diagnosis not present

## 2020-01-26 DIAGNOSIS — I480 Paroxysmal atrial fibrillation: Secondary | ICD-10-CM

## 2020-01-26 MED ORDER — SPIRONOLACTONE 25 MG PO TABS: 12.5000 mg | ORAL_TABLET | Freq: Every day | ORAL | 3 refills | Status: DC

## 2020-01-26 MED ORDER — FUROSEMIDE 40 MG PO TABS: 40.0000 mg | ORAL_TABLET | Freq: Every day | ORAL | 3 refills | Status: DC

## 2020-01-26 NOTE — Patient Instructions (Signed)
Medication Instructions:  Your physician recommends that you continue on your current medications as directed. Please refer to the Current Medication list given to you today.  *If you need a refill on your cardiac medications before your next appointment, please call your pharmacy*   Lab Work: None If you have labs (blood work) drawn today and your tests are completely normal, you will receive your results only by: . MyChart Message (if you have MyChart) OR . A paper copy in the mail If you have any lab test that is abnormal or we need to change your treatment, we will call you to review the results.   Testing/Procedures: None   Follow-Up: At [facility], you and your health needs are our priority.  As part of our continuing mission to provide you with exceptional heart care, we have created designated Provider Care Teams.  These Care Teams include your primary Cardiologist (physician) and Advanced Practice Providers (APPs -  Physician Assistants and Nurse Practitioners) who all work together to provide you with the care you need, when you need it.  We recommend signing up for the patient portal called "MyChart".  Sign up information is provided on this After Visit Summary.  MyChart is used to connect with patients for Virtual Visits (Telemedicine).  Patients are able to view lab/test results, encounter notes, upcoming appointments, etc.  Non-urgent messages can be sent to your provider as well.   To learn more about what you can do with MyChart, go to [email or web address].    Your next appointment:   6-8 month(s)  The format for your next appointment:   In Person  Provider:   You may see Henry W Smith III, MD or one of the following Advanced Practice Providers on your designated Care Team:    Lori Gerhardt, NP  Laura Ingold, NP  Jill McDaniel, NP    Other Instructions   

## 2020-02-21 DIAGNOSIS — H0220C Unspecified lagophthalmos, bilateral, upper and lower eyelids: Secondary | ICD-10-CM | POA: Diagnosis not present

## 2020-02-21 DIAGNOSIS — H26493 Other secondary cataract, bilateral: Secondary | ICD-10-CM | POA: Diagnosis not present

## 2020-02-21 DIAGNOSIS — H43393 Other vitreous opacities, bilateral: Secondary | ICD-10-CM | POA: Diagnosis not present

## 2020-02-21 DIAGNOSIS — Z961 Presence of intraocular lens: Secondary | ICD-10-CM | POA: Diagnosis not present

## 2020-02-21 DIAGNOSIS — H43813 Vitreous degeneration, bilateral: Secondary | ICD-10-CM | POA: Diagnosis not present

## 2020-02-28 ENCOUNTER — Other Ambulatory Visit: Payer: Self-pay | Admitting: Family Medicine

## 2020-02-28 ENCOUNTER — Other Ambulatory Visit: Payer: Self-pay

## 2020-02-28 ENCOUNTER — Ambulatory Visit
Admission: RE | Admit: 2020-02-28 | Discharge: 2020-02-28 | Disposition: A | Discharge status: Home / Self Care | Payer: Medicare Other | Source: Ambulatory Visit | Attending: Family Medicine | Admitting: Family Medicine

## 2020-02-28 DIAGNOSIS — I2510 Atherosclerotic heart disease of native coronary artery without angina pectoris: Secondary | ICD-10-CM | POA: Diagnosis not present

## 2020-02-28 DIAGNOSIS — M5441 Lumbago with sciatica, right side: Secondary | ICD-10-CM

## 2020-02-28 DIAGNOSIS — M549 Dorsalgia, unspecified: Secondary | ICD-10-CM | POA: Diagnosis not present

## 2020-02-28 DIAGNOSIS — M5442 Lumbago with sciatica, left side: Secondary | ICD-10-CM | POA: Diagnosis not present

## 2020-02-28 DIAGNOSIS — G20 Parkinson's disease: Secondary | ICD-10-CM | POA: Diagnosis not present

## 2020-03-14 ENCOUNTER — Other Ambulatory Visit: Payer: Self-pay

## 2020-03-14 MED ORDER — METOPROLOL TARTRATE 25 MG PO TABS: 12.5000 mg | ORAL_TABLET | Freq: Two times a day (BID) | ORAL | 3 refills | Status: DC

## 2020-03-16 ENCOUNTER — Other Ambulatory Visit: Payer: Self-pay

## 2020-03-16 ENCOUNTER — Ambulatory Visit: Payer: Medicare Other | Attending: Family Medicine

## 2020-03-16 DIAGNOSIS — R293 Abnormal posture: Secondary | ICD-10-CM

## 2020-03-16 DIAGNOSIS — M545 Low back pain, unspecified: Secondary | ICD-10-CM

## 2020-03-16 DIAGNOSIS — G8929 Other chronic pain: Secondary | ICD-10-CM

## 2020-03-16 DIAGNOSIS — M2560 Stiffness of unspecified joint, not elsewhere classified: Secondary | ICD-10-CM | POA: Diagnosis not present

## 2020-03-16 DIAGNOSIS — R262 Difficulty in walking, not elsewhere classified: Secondary | ICD-10-CM

## 2020-03-16 NOTE — Therapy (Signed)
Calvin Ramirez, [place], [address] Phone: [phone]   Fax:  [phone]  Physical Therapy Evaluation  Patient Details  Name: Calvin Ramirez MRN: [id number] Date of Birth: [birth year] Referring Provider (PT): Shirline Frees, MD   Encounter Date: 03/16/2020  PT End of Session - 03/16/20 0901    Visit Number  1    Number of Visits  12    Date for PT Re-Evaluation  04/28/20    Authorization Type  MDC    Authorization Time Period  Progress visit 10    KX visit 15    PT Start Time  0822    PT Stop Time  0905    PT Time Calculation (min)  43 min    Activity Tolerance  Patient tolerated treatment well;No increased pain    Behavior During Therapy  WFL for tasks assessed/performed       Past Medical History:  Diagnosis Date  . Acute diastolic (congestive) heart failure ([place]) 02/13/2018  . Arthritis   . 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 CAD (coronary artery disease) 02/24/2018  . CAD in native artery 04/26/2015   2011 Multivessel DES PCI LAD,RCA PLOM and distal RCA   . Coronary artery disease   . Dementia ([place])   . Essential hypertension 04/21/2014  . Hyperlipidemia   . Parkinson's disease ([place]) 04/27/2015   2016   . Paroxysmal atrial fibrillation ([place]) 03/17/2018  . 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 Stented coronary artery 2011   x3    Past Surgical History:  Procedure Laterality Date  . APPENDECTOMY    . CARDIAC CATHETERIZATION  2011   3 stents  . COLONOSCOPY    . CORONARY ARTERY BYPASS GRAFT N/A 02/25/2018   Procedure: CORONARY ARTERY BYPASS GRAFTING (CABG), ON PUMP, TIMES TWO, USING LEFT INTERNAL MAMMARY ARTERY AND ENDOSCOPICALLY HARVESTED RIGHT GREATER SAPHENOUS VEIN;  Surgeon: Rexene Alberts, MD;  Location: [facility];  Service: Open Heart Surgery;  Laterality: N/A;  . RIGHT/LEFT HEART CATH AND CORONARY ANGIOGRAPHY N/A 02/24/2018   Procedure:  RIGHT/LEFT HEART CATH AND CORONARY ANGIOGRAPHY;  Surgeon: Belva Crome, MD;  Location: [facility];  Service: Cardiovascular;  Laterality: N/A;  . TEE WITHOUT CARDIOVERSION N/A 02/25/2018   Procedure: TRANSESOPHAGEAL ECHOCARDIOGRAM (TEE);  Surgeon: Rexene Alberts, MD;  Location: [facility];  Service: Open Heart Surgery;  Laterality: N/A;  . TONSILLECTOMY      There were no vitals filed for this visit.   Subjective Assessment - 03/16/20 0827    Subjective  He reports lower back pain   Taking asprins for pain. Limited time on feet.    AM pain increased.    MD said something about several  areas of damage one not healed    Limitations  Walking;Standing;House hold activities   hills, AM   How long can you sit comfortably?  as needed    How long can you stand comfortably?  2-3 min    How long can you walk comfortably?  1/2 block    Currently in Pain?  No/denies   sitting   Pain Score  8    in past week   Pain Location  Back    Pain Orientation  Right;Left;Lower    Pain Descriptors / Indicators  --   gnawing.   Pain Type  Chronic pain    Pain Onset  More than  a month ago    Pain Frequency  Intermittent    Aggravating Factors   activity on feet    Pain Relieving Factors  Sitting , meds         [facility] PT Assessment - 03/16/20 0001      Assessment   Medical Diagnosis  LBP    Referring Provider (PT)  Shirline Frees, MD    Onset Date/Surgical Date  --   years   Next MD Visit  As needed    Prior Therapy  Chropractics years ago      Precautions   Precautions  None      Restrictions   Weight Bearing Restrictions  No      Balance Screen   Has the patient fallen in the past 6 months  Yes    How many times?  1   due to pain in legs.   Has the patient had a decrease in activity level because of a fear of falling?   Yes    Is the patient reluctant to leave their home because of a fear of falling?   No      Prior Function   Level of Independence  Independent      Cognition    Overall Cognitive Status  Within Functional Limits for tasks assessed      Observation/Other Assessments   Focus on Therapeutic Outcomes (FOTO)   49% limited      Posture/Postural Control   Posture Comments  decr ext lower back, 5-10 degree flexion at knees      ROM / Strength   AROM / PROM / Strength  AROM;PROM;Strength      AROM   AROM Assessment Site  Lumbar    Lumbar Flexion  60    Lumbar Extension  5    Lumbar - Right Side Bend  5    Lumbar - Left Side Bend  5      PROM   Overall PROM Comments  10 degrees bilater hip IR    20 deggrres Lt hip adduction,  10 degrees RT hip adduction      Strength   Overall Strength Comments  ormal LE strength      Flexibility   Soft Tissue Assessment /Muscle Length  yes    Hamstrings  30-35 degress      Ambulation/Gait   Gait Comments  Forward lean decr  strike                Objective measurements completed on examination: See above findings.              PT Education - 03/16/20 0901    Education Details  POC    Person(s) Educated  Patient    Methods  Explanation    Comprehension  Verbalized understanding       PT Short Term Goals - 03/16/20 0907      PT SHORT TERM GOAL #1   Title  He will be independent with intial HEP    Time  3    Period  Weeks    Status  New      PT SHORT TERM GOAL #2   Title  He will report able to walk > 1/2 block before increased pain    Time  3    Period  Weeks    Status  New        PT Long Term Goals - 03/16/20 0919      PT LONG TERM GOAL #  1   Title  He will be indpeendent  with all HEP issued    Time  6    Period  Weeks    Status  New      PT LONG TERM GOAL #2   Title  He will report able to walk > 1 block before increased back pain.    Time  6    Period  Weeks    Status  New      PT LONG TERM GOAL #3   Title  He will report 50% less m PAIN/STIFFNESS  with exercise    Time  6    Period  Weeks    Status  New      PT LONG TERM GOAL #4   Title  He will  report 50% less pain and stiffness with home activity.    Time  6    Period  Weeks    Status  New      PT LONG TERM GOAL #5   Title  FOTO score improved  to 40% limited    Time  6    Period  Weeks    Status  New             Plan - 03/16/20 0902    Clinical Impression Statement  Mr Ditter presents with chronic LBP and some pain in legs with prolonged time on feet. He demo stiffness of soine and hips and hamstrings. LE strnegth Normal .     Flexed posture.  Progress is unclear due to duration of symptoms and failure of chiropractic treatment but trial of Pt is warrented    Personal Factors and Comorbidities  Age;Time since onset of injury/illness/exacerbation;Comorbidity 1;Comorbidity 2    Comorbidities  parlkinsons, compression FX old    Examination-Activity Limitations  Locomotion Level;Reach Overhead;Bend;Stand    Examination-Participation Restrictions  Community Activity;Yard Work    Stability/Clinical Decision Making  Stable/Uncomplicated    Designer, jewellery  Low    Rehab Potential  Good    PT Frequency  2x / week    PT Duration  6 weeks    PT Treatment/Interventions  Moist Heat;Electrical Stimulation;Traction;Therapeutic exercise;Manual techniques;Patient/family education;Dry needling;Passive range of motion    PT Next Visit Plan  Start cores stability and manual for ROM and pain. modalities    Consulted and Agree with Plan of Care  Patient       Patient will benefit from skilled therapeutic intervention in order to improve the following deficits and impairments:  Pain, Postural dysfunction, Decreased activity tolerance, Decreased range of motion, Difficulty walking  Visit Diagnosis: Chronic bilateral low back pain, unspecified whether sciatica present  Joint stiffness of spine  Abnormal posture  Difficulty in walking, not elsewhere classified     Problem List Patient Active Problem List   Diagnosis Date Noted  . Paroxysmal atrial fibrillation ([place])  03/17/2018  . 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 CAD (coronary artery disease) 02/24/2018  . Dementia ([place])   . 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 Parkinson's disease ([place]) 04/27/2015  . Essential hypertension   . Acute on chronic diastolic heart failure ([place]) 06/27/2008  . Hyperlipidemia   . ACUTE SINUSITIS, UNSPECIFIED 11/24/2007  . CONJUNCTIVITIS, ACUTE NOS 05/06/2007    Darrel Hoover  PT 03/16/2020, 9:36 AM  [facility] [address] [place], [place], [address] Phone: [phone]   Fax:  [phone]  Name: KIONNE HEINLEIN MRN: [id number] Date of Birth: [birth year]

## 2020-03-27 ENCOUNTER — Other Ambulatory Visit: Payer: Self-pay

## 2020-03-27 ENCOUNTER — Ambulatory Visit: Payer: Medicare Other | Attending: Family Medicine | Admitting: Physical Therapy

## 2020-03-27 ENCOUNTER — Encounter: Payer: Self-pay | Admitting: Physical Therapy

## 2020-03-27 DIAGNOSIS — M545 Low back pain, unspecified: Secondary | ICD-10-CM

## 2020-03-27 DIAGNOSIS — R262 Difficulty in walking, not elsewhere classified: Secondary | ICD-10-CM | POA: Diagnosis not present

## 2020-03-27 DIAGNOSIS — R293 Abnormal posture: Secondary | ICD-10-CM | POA: Insufficient documentation

## 2020-03-27 DIAGNOSIS — G8929 Other chronic pain: Secondary | ICD-10-CM | POA: Insufficient documentation

## 2020-03-27 DIAGNOSIS — M2560 Stiffness of unspecified joint, not elsewhere classified: Secondary | ICD-10-CM | POA: Insufficient documentation

## 2020-03-27 NOTE — Therapy (Signed)
[place], [place], [address] Phone: [phone]   Fax:  [phone]  Physical Therapy Treatment  Patient Details  Name: Calvin Ramirez MRN: [id number] Date of Birth: [birth year] Referring Provider (PT): Shirline Frees, MD   Encounter Date: 03/27/2020  PT End of Session - 03/27/20 0827    Visit Number  2    Number of Visits  12    Date for PT Re-Evaluation  04/28/20    Authorization Type  MDC    Authorization Time Period  Progress visit 10    KX visit 15    PT Start Time  0800    PT Stop Time  0840    PT Time Calculation (min)  40 min       Past Medical History:  Diagnosis Date  . Acute diastolic (congestive) heart failure ([place]) 02/13/2018  . Arthritis   . 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 CAD (coronary artery disease) 02/24/2018  . CAD in native artery 04/26/2015   2011 Multivessel DES PCI LAD,RCA PLOM and distal RCA   . Coronary artery disease   . Dementia ([place])   . Essential hypertension 04/21/2014  . Hyperlipidemia   . Parkinson's disease ([place]) 04/27/2015   2016   . Paroxysmal atrial fibrillation ([place]) 03/17/2018  . 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 Stented coronary artery 2011   x3    Past Surgical History:  Procedure Laterality Date  . APPENDECTOMY    . CARDIAC CATHETERIZATION  2011   3 stents  . COLONOSCOPY    . CORONARY ARTERY BYPASS GRAFT N/A 02/25/2018   Procedure: CORONARY ARTERY BYPASS GRAFTING (CABG), ON PUMP, TIMES TWO, USING LEFT INTERNAL MAMMARY ARTERY AND ENDOSCOPICALLY HARVESTED RIGHT GREATER SAPHENOUS VEIN;  Surgeon: Rexene Alberts, MD;  Location: [facility];  Service: Open Heart Surgery;  Laterality: N/A;  . RIGHT/LEFT HEART CATH AND CORONARY ANGIOGRAPHY N/A 02/24/2018   Procedure: RIGHT/LEFT HEART CATH AND CORONARY ANGIOGRAPHY;  Surgeon: Belva Crome, MD;  Location: [facility];  Service: Cardiovascular;  Laterality:  N/A;  . TEE WITHOUT CARDIOVERSION N/A 02/25/2018   Procedure: TRANSESOPHAGEAL ECHOCARDIOGRAM (TEE);  Surgeon: Rexene Alberts, MD;  Location: [facility];  Service: Open Heart Surgery;  Laterality: N/A;  . TONSILLECTOMY      There were no vitals filed for this visit.  Subjective Assessment - 03/27/20 0802    Subjective  I can feel pain now.    Currently in Pain?  Yes    Pain Score  2     Pain Location  Back    Pain Orientation  Right;Left;Lower    Pain Descriptors / Indicators  Aching    Aggravating Factors   walking up hills, as activity increases    Pain Relieving Factors  sitting, meds                       [facility] Adult PT Treatment/Exercise - 03/27/20 0001      Lumbar Exercises: Stretches   Active Hamstring Stretch  3 reps;20 seconds    Active Hamstring Stretch Limitations  seated     Single Knee to Chest Stretch  3 reps;20 seconds    Lower Trunk Rotation  10 seconds    Lower Trunk Rotation Limitations  10 reps    Hip Flexor Stretch  1 rep;60 seconds      Lumbar Exercises:  Aerobic   Nustep  L5 UE/LE x 7 minutes       Lumbar Exercises: Supine   Pelvic Tilt  15 reps    Bridge  10 reps             PT Education - 03/27/20 [phone]    Education Details  HEP    Person(s) Educated  Patient    Methods  Explanation;Handout    Comprehension  Verbalized understanding       PT Short Term Goals - 03/16/20 0907      PT SHORT TERM GOAL #1   Title  He will be independent with intial HEP    Time  3    Period  Weeks    Status  New      PT SHORT TERM GOAL #2   Title  He will report able to walk > 1/2 block before increased pain    Time  3    Period  Weeks    Status  New        PT Long Term Goals - 03/16/20 0919      PT LONG TERM GOAL #1   Title  He will be indpeendent  with all HEP issued    Time  6    Period  Weeks    Status  New      PT LONG TERM GOAL #2   Title  He will report able to walk > 1 block before increased back pain.    Time  6     Period  Weeks    Status  New      PT LONG TERM GOAL #3   Title  He will report 50% less m PAIN/STIFFNESS  with exercise    Time  6    Period  Weeks    Status  New      PT LONG TERM GOAL #4   Title  He will report 50% less pain and stiffness with home activity.    Time  6    Period  Weeks    Status  New      PT LONG TERM GOAL #5   Title  FOTO score improved  to 40% limited    Time  6    Period  Weeks    Status  New            Plan - 03/27/20 [id number]    Clinical Impression Statement  Wife present for treatment to assist patient in remebering his HEP. He c/o back pain limiting his ability to walk with his wife for exercise. Began postural stretching and core stability. Issued initial HEP. He felt like his muscles were twitching after therex. He decined HMP.    Comorbidities  parlkinsons, compression FX old    Examination-Activity Limitations  Locomotion Level;Reach Overhead;Bend;Stand    PT Treatment/Interventions  Moist Heat;Electrical Stimulation;Traction;Therapeutic exercise;Manual techniques;Patient/family education;Dry needling;Passive range of motion    PT Next Visit Plan  Start cores stability and manual for ROM and pain. modalities    PT Home Exercise Plan  [phone]: bridge, pelvic tilit, hip flexor stretch, hamstring stretch seated       Patient will benefit from skilled therapeutic intervention in order to improve the following deficits and impairments:  Pain, Postural dysfunction, Decreased activity tolerance, Decreased range of motion, Difficulty walking  Visit Diagnosis: Chronic bilateral low back pain, unspecified whether sciatica present  Joint stiffness of spine  Abnormal posture  Difficulty in walking, not elsewhere classified  Problem List Patient Active Problem List   Diagnosis Date Noted  . Paroxysmal atrial fibrillation ([place]) 03/17/2018  . 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 CAD (coronary artery disease) 02/24/2018  . Dementia ([place])   . Bilateral  iliac artery aneurysm ([place]) 09/09/2016  . Parkinson's disease ([place]) 04/27/2015  . Essential hypertension   . Acute on chronic diastolic heart failure ([place]) 06/27/2008  . Hyperlipidemia   . ACUTE SINUSITIS, UNSPECIFIED 11/24/2007  . CONJUNCTIVITIS, ACUTE NOS 05/06/2007    Dorene Ar, PTA 03/27/2020, 9:02 AM  [facility] [address] [place], [place], [address] Phone: [phone]   Fax:  [phone]  Name: Calvin Ramirez MRN: [id number] Date of Birth: [birth year]

## 2020-03-27 NOTE — Patient Instructions (Signed)
Access Code: [email or web address] URL: [email or web address] Date: 03/27/2020 Prepared by: Hessie Diener  Exercises Seated Hamstring Stretch - 2-3 x daily - 7 x weekly - 1 sets - 3 reps - 20 hold Supine Bridge - 2 x daily - 7 x weekly - 2 sets - 10 reps - 5 hold Modified Thomas Stretch - 2 x daily - 7 x weekly - 2 sets - 1 reps - 60 hold Supine Posterior Pelvic Tilt - 2 x daily - 7 x weekly - 2 sets - 10 reps

## 2020-03-31 ENCOUNTER — Encounter: Payer: Self-pay | Admitting: Physical Therapy

## 2020-03-31 ENCOUNTER — Ambulatory Visit: Payer: Medicare Other | Admitting: Physical Therapy

## 2020-03-31 ENCOUNTER — Other Ambulatory Visit: Payer: Self-pay

## 2020-03-31 DIAGNOSIS — R262 Difficulty in walking, not elsewhere classified: Secondary | ICD-10-CM | POA: Diagnosis not present

## 2020-03-31 DIAGNOSIS — R293 Abnormal posture: Secondary | ICD-10-CM | POA: Diagnosis not present

## 2020-03-31 DIAGNOSIS — M545 Low back pain, unspecified: Secondary | ICD-10-CM

## 2020-03-31 DIAGNOSIS — G8929 Other chronic pain: Secondary | ICD-10-CM

## 2020-03-31 DIAGNOSIS — M2560 Stiffness of unspecified joint, not elsewhere classified: Secondary | ICD-10-CM

## 2020-03-31 NOTE — Therapy (Signed)
[place], [place], [address] Phone: [phone]   Fax:  [phone]  Physical Therapy Treatment  Patient Details  Name: Calvin Ramirez MRN: [id number] Date of Birth: September 08, 1941 Referring Provider (PT): Shirline Frees, MD   Encounter Date: 03/31/2020  PT End of Session - 03/31/20 0847    Visit Number  3    Number of Visits  12    Date for PT Re-Evaluation  04/28/20    Authorization Type  MDC    Authorization Time Period  Progress visit 10    KX visit 15    PT Start Time  0845    PT Stop Time  0928    PT Time Calculation (min)  43 min       Past Medical History:  Diagnosis Date  . Acute diastolic (congestive) heart failure ([place]) 02/13/2018  . Arthritis   . 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 CAD (coronary artery disease) 02/24/2018  . CAD in native artery 04/26/2015   2011 Multivessel DES PCI LAD,RCA PLOM and distal RCA   . Coronary artery disease   . Dementia ([place])   . Essential hypertension 04/21/2014  . Hyperlipidemia   . Parkinson's disease ([place]) 04/27/2015   2016   . Paroxysmal atrial fibrillation ([place]) 03/17/2018  . 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 Stented coronary artery 2011   x3    Past Surgical History:  Procedure Laterality Date  . APPENDECTOMY    . CARDIAC CATHETERIZATION  2011   3 stents  . COLONOSCOPY    . CORONARY ARTERY BYPASS GRAFT N/A 02/25/2018   Procedure: CORONARY ARTERY BYPASS GRAFTING (CABG), ON PUMP, TIMES TWO, USING LEFT INTERNAL MAMMARY ARTERY AND ENDOSCOPICALLY HARVESTED RIGHT GREATER SAPHENOUS VEIN;  Surgeon: Rexene Alberts, MD;  Location: [facility];  Service: Open Heart Surgery;  Laterality: N/A;  . RIGHT/LEFT HEART CATH AND CORONARY ANGIOGRAPHY N/A 02/24/2018   Procedure: RIGHT/LEFT HEART CATH AND CORONARY ANGIOGRAPHY;  Surgeon: Belva Crome, MD;  Location: [facility];  Service: Cardiovascular;  Laterality:  N/A;  . TEE WITHOUT CARDIOVERSION N/A 02/25/2018   Procedure: TRANSESOPHAGEAL ECHOCARDIOGRAM (TEE);  Surgeon: Rexene Alberts, MD;  Location: [facility];  Service: Open Heart Surgery;  Laterality: N/A;  . TONSILLECTOMY      There were no vitals filed for this visit.  Subjective Assessment - 03/31/20 0845    Subjective  I did the exercises a couple of times a day.    Currently in Pain?  No/denies                       [facility] Adult PT Treatment/Exercise - 03/31/20 0001      Ambulation/Gait   Ambulation/Gait  Yes    Ambulation/Gait Assistance  7: Independent    Ambulation Distance (Feet)  614 Feet   4 min and 15 sec    Assistive device  None    Ambulation Surface  Indoor    Gait Comments  cues for increased step length, heel strike and posture. Began to c/o pain with 30 sec and pain increased to 8/10 during last minute of gait       Lumbar Exercises: Stretches   Active Hamstring Stretch  3 reps;20 seconds    Active Hamstring Stretch Limitations  seated     Single Knee to Chest Stretch  3 reps;20 seconds  Lower Trunk Rotation  10 seconds    Lower Trunk Rotation Limitations  10 reps    Hip Flexor Stretch  1 rep;2 reps;60 seconds    Standing Extension  10 reps    Standing Extension Limitations  5 sec-     Figure 4 Stretch Limitations  passive and active push and pull    Gastroc Stretch Limitations  bilat toes on back UBE x 30 sec       Lumbar Exercises: Aerobic   Nustep  L5 UE/LE x 5 minutes       Lumbar Exercises: Seated   Sit to Stand  10 reps    Sit to Stand Limitations  cues to fully extend and engage gluteals       Lumbar Exercises: Supine   Pelvic Tilt  15 reps    Bridge  15 reps               PT Short Term Goals - 03/16/20 [id number]      PT SHORT TERM GOAL #1   Title  He will be independent with intial HEP    Time  3    Period  Weeks    Status  New      PT SHORT TERM GOAL #2   Title  He will report able to walk > 1/2 block before increased pain     Time  3    Period  Weeks    Status  New        PT Long Term Goals - 03/16/20 0919      PT LONG TERM GOAL #1   Title  He will be indpeendent  with all HEP issued    Time  6    Period  Weeks    Status  New      PT LONG TERM GOAL #2   Title  He will report able to walk > 1 block before increased back pain.    Time  6    Period  Weeks    Status  New      PT LONG TERM GOAL #3   Title  He will report 50% less m PAIN/STIFFNESS  with exercise    Time  6    Period  Weeks    Status  New      PT LONG TERM GOAL #4   Title  He will report 50% less pain and stiffness with home activity.    Time  6    Period  Weeks    Status  New      PT LONG TERM GOAL #5   Title  FOTO score improved  to 40% limited    Time  6    Period  Weeks    Status  New            Plan - 03/31/20 0847    Clinical Impression Statement  Pts wife reports he has been compliant with HEP 2 x per day with her encouragement. She reports less c/o back pain however some pain during HEP. Gait in clinic today with c/o min/irritating pain within first 30 sec and it increased by the last minute of gait to 8/10. Pain is located bilateral glut medius area.    PT Next Visit Plan  Start cores stability and manual for ROM and pain. modalities    PT Home Exercise Plan  [phone]: bridge, pelvic tilit, hip flexor stretch, hamstring stretch seated       Patient will benefit  from skilled therapeutic intervention in order to improve the following deficits and impairments:  Pain, Postural dysfunction, Decreased activity tolerance, Decreased range of motion, Difficulty walking  Visit Diagnosis: Chronic bilateral low back pain, unspecified whether sciatica present  Joint stiffness of spine  Abnormal posture  Difficulty in walking, not elsewhere classified     Problem List Patient Active Problem List   Diagnosis Date Noted  . Paroxysmal atrial fibrillation ([place]) 03/17/2018  . 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 CAD  (coronary artery disease) 02/24/2018  . Dementia ([place])   . 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 Parkinson's disease ([place]) 04/27/2015  . Essential hypertension   . Acute on chronic diastolic heart failure ([place]) 06/27/2008  . Hyperlipidemia   . ACUTE SINUSITIS, UNSPECIFIED 11/24/2007  . CONJUNCTIVITIS, ACUTE NOS 05/06/2007    Dorene Ar, PTA 03/31/2020, 9:48 AM  [facility] [address] [place], [place], [address] Phone: [phone]   Fax:  [phone]  Name: FRANKI GENS MRN: [id number] Date of Birth: [birth year]

## 2020-04-03 ENCOUNTER — Ambulatory Visit: Payer: Medicare Other

## 2020-04-03 ENCOUNTER — Other Ambulatory Visit: Payer: Self-pay

## 2020-04-03 DIAGNOSIS — M545 Low back pain, unspecified: Secondary | ICD-10-CM

## 2020-04-03 DIAGNOSIS — R262 Difficulty in walking, not elsewhere classified: Secondary | ICD-10-CM

## 2020-04-03 DIAGNOSIS — R293 Abnormal posture: Secondary | ICD-10-CM | POA: Diagnosis not present

## 2020-04-03 DIAGNOSIS — M2560 Stiffness of unspecified joint, not elsewhere classified: Secondary | ICD-10-CM | POA: Diagnosis not present

## 2020-04-03 DIAGNOSIS — G8929 Other chronic pain: Secondary | ICD-10-CM | POA: Diagnosis not present

## 2020-04-03 NOTE — Therapy (Signed)
[place], [place], [address] Phone: [phone]   Fax:  [phone]  Physical Therapy Treatment  Patient Details  Name: Calvin Ramirez MRN: [id number] Date of Birth: May 01, 1941 Referring Provider (PT): Shirline Frees, MD   Encounter Date: 04/03/2020  PT End of Session - 04/03/20 1000    Visit Number  4    Number of Visits  12    Date for PT Re-Evaluation  04/28/20    Authorization Type  MDR    Authorization Time Period  Progress visit 10    KX visit 15    PT Start Time  1000    PT Stop Time  1043    PT Time Calculation (min)  43 min    Activity Tolerance  Patient tolerated treatment well;No increased pain    Behavior During Therapy  WFL for tasks assessed/performed       Past Medical History:  Diagnosis Date  . Acute diastolic (congestive) heart failure ([place]) 02/13/2018  . Arthritis   . 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 CAD (coronary artery disease) 02/24/2018  . CAD in native artery 04/26/2015   2011 Multivessel DES PCI LAD,RCA PLOM and distal RCA   . Coronary artery disease   . Dementia ([place])   . Essential hypertension 04/21/2014  . Hyperlipidemia   . Parkinson's disease ([place]) 04/27/2015   2016   . Paroxysmal atrial fibrillation ([place]) 03/17/2018  . 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 Stented coronary artery 2011   x3    Past Surgical History:  Procedure Laterality Date  . APPENDECTOMY    . CARDIAC CATHETERIZATION  2011   3 stents  . COLONOSCOPY    . CORONARY ARTERY BYPASS GRAFT N/A 02/25/2018   Procedure: CORONARY ARTERY BYPASS GRAFTING (CABG), ON PUMP, TIMES TWO, USING LEFT INTERNAL MAMMARY ARTERY AND ENDOSCOPICALLY HARVESTED RIGHT GREATER SAPHENOUS VEIN;  Surgeon: Rexene Alberts, MD;  Location: [facility];  Service: Open Heart Surgery;  Laterality: N/A;  . RIGHT/LEFT HEART CATH AND CORONARY ANGIOGRAPHY N/A 02/24/2018   Procedure:  RIGHT/LEFT HEART CATH AND CORONARY ANGIOGRAPHY;  Surgeon: Belva Crome, MD;  Location: [facility];  Service: Cardiovascular;  Laterality: N/A;  . TEE WITHOUT CARDIOVERSION N/A 02/25/2018   Procedure: TRANSESOPHAGEAL ECHOCARDIOGRAM (TEE);  Surgeon: Rexene Alberts, MD;  Location: [facility];  Service: Open Heart Surgery;  Laterality: N/A;  . TONSILLECTOMY      There were no vitals filed for this visit.  Subjective Assessment - 04/03/20 1011    Pain Score  2     Pain Location  Back    Pain Orientation  Right;Left;Lower    Pain Descriptors / Indicators  Aching    Pain Type  Chronic pain    Pain Onset  More than a month ago    Pain Frequency  Intermittent    Aggravating Factors   walking hills  being ion feet    Pain Relieving Factors  sit medsa                       [facility] Adult PT Treatment/Exercise - 04/03/20 0001      Lumbar Exercises: Stretches   Active Hamstring Stretch  20 seconds;2 reps;Right;Left    Active Hamstring Stretch Limitations  seated with strap for DF    Single Knee to Chest Stretch  Right;Left;2 reps;30 seconds  Lower Trunk Rotation  10 seconds    Lower Trunk Rotation Limitations  10 reps    Hip Flexor Stretch  1 rep;2 reps;60 seconds    Standing Extension  5 reps;5 seconds    Standing Extension Limitations  --    Gastroc Stretch Limitations  bilat toes on slant board x 30 sec       Lumbar Exercises: Aerobic   Nustep  L5 UE/LE x 5 minutes       Lumbar Exercises: Seated   Sit to Stand  10 reps    Sit to Stand Limitations  cues to fully extend and engage gluteals       Lumbar Exercises: Supine   Pelvic Tilt  10 reps    Bridge Limitations  Shoulder bridge short ROM cued for technique  with peel up and lower segmentally             PT Education - 04/03/20 1047    Education Details  HEP     Karenann Cai of light chair when walking or activity that requires prolonged stand and walk so can sit befor pain increases significantly    Person(s)  Educated  Patient    Methods  Explanation;Demonstration;Tactile cues;Verbal cues;Handout    Comprehension  Verbalized understanding;Returned demonstration       PT Short Term Goals - 04/03/20 1050      PT SHORT TERM GOAL #1   Title  He will be independent with intial HEP    Status  Achieved        PT Long Term Goals - 03/16/20 0919      PT LONG TERM GOAL #1   Title  He will be indpeendent  with all HEP issued    Time  6    Period  Weeks    Status  New      PT LONG TERM GOAL #2   Title  He will report able to walk > 1 block before increased back pain.    Time  6    Period  Weeks    Status  New      PT LONG TERM GOAL #3   Title  He will report 50% less m PAIN/STIFFNESS  with exercise    Time  6    Period  Weeks    Status  New      PT LONG TERM GOAL #4   Title  He will report 50% less pain and stiffness with home activity.    Time  6    Period  Weeks    Status  New      PT LONG TERM GOAL #5   Title  FOTO score improved  to 40% limited    Time  6    Period  Weeks    Status  New            Plan - 04/03/20 1048    Clinical Impression Statement  Continued to work on flexion and core based strength. Still problems with incr pain with activity on feet  and i'm not sure oif we can make any long term decrease in this.  But will continue to sork on core flexion stretch    PT Treatment/Interventions  Moist Heat;Electrical Stimulation;Traction;Therapeutic exercise;Manual techniques;Patient/family education;Dry needling;Passive range of motion    PT Next Visit Plan  Start cores stability and manual for ROM and pain. modalities    PT Home Exercise Plan  [phone]: bridge, pelvic tilit, hip flexor stretch, hamstring stretch seated  shoulder bridge,   walkk PPT    Consulted and Agree with Plan of Care  Patient       Patient will benefit from skilled therapeutic intervention in order to improve the following deficits and impairments:  Pain, Postural dysfunction, Decreased  activity tolerance, Decreased range of motion, Difficulty walking  Visit Diagnosis: Chronic bilateral low back pain, unspecified whether sciatica present  Joint stiffness of spine  Abnormal posture  Difficulty in walking, not elsewhere classified     Problem List Patient Active Problem List   Diagnosis Date Noted  . Paroxysmal atrial fibrillation ([place]) 03/17/2018  . 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 CAD (coronary artery disease) 02/24/2018  . Dementia ([place])   . 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 Parkinson's disease ([place]) 04/27/2015  . Essential hypertension   . Acute on chronic diastolic heart failure ([place]) 06/27/2008  . Hyperlipidemia   . ACUTE SINUSITIS, UNSPECIFIED 11/24/2007  . CONJUNCTIVITIS, ACUTE NOS 05/06/2007    Darrel Hoover PT 04/03/2020, 10:51 AM  [facility] [address] [place], [place], [address] Phone: [phone]   Fax:  [phone]  Name: Calvin Ramirez MRN: [id number] Date of Birth: 29-Mar-1941

## 2020-04-03 NOTE — Patient Instructions (Signed)
Shoulder bridge with segmental lower and raise x 10 1-2x/day hold 5 sec and  Stand wall PPT 5 sec hold x 10 2x/day

## 2020-04-07 ENCOUNTER — Ambulatory Visit: Payer: Medicare Other | Admitting: Physical Therapy

## 2020-04-07 ENCOUNTER — Other Ambulatory Visit: Payer: Self-pay

## 2020-04-07 ENCOUNTER — Encounter: Payer: Self-pay | Admitting: Physical Therapy

## 2020-04-07 DIAGNOSIS — G8929 Other chronic pain: Secondary | ICD-10-CM

## 2020-04-07 DIAGNOSIS — R293 Abnormal posture: Secondary | ICD-10-CM | POA: Diagnosis not present

## 2020-04-07 DIAGNOSIS — M545 Low back pain, unspecified: Secondary | ICD-10-CM

## 2020-04-07 DIAGNOSIS — M2560 Stiffness of unspecified joint, not elsewhere classified: Secondary | ICD-10-CM

## 2020-04-07 DIAGNOSIS — R262 Difficulty in walking, not elsewhere classified: Secondary | ICD-10-CM

## 2020-04-07 NOTE — Therapy (Signed)
[place], [place], [address] Phone: [phone]   Fax:  [phone]  Physical Therapy Treatment  Patient Details  Name: Calvin Ramirez MRN: [id number] Date of Birth: 1940-12-09 Referring Provider (PT): Shirline Frees, MD   Encounter Date: 04/07/2020  PT End of Session - 04/07/20 0840    Visit Number  5    Number of Visits  12    Date for PT Re-Evaluation  04/28/20    Authorization Type  MDR    Authorization Time Period  Progress visit 10    KX visit 15    PT Start Time  0835    PT Stop Time  0920    PT Time Calculation (min)  45 min       Past Medical History:  Diagnosis Date  . Acute diastolic (congestive) heart failure ([place]) 02/13/2018  . Arthritis   . 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 CAD (coronary artery disease) 02/24/2018  . CAD in native artery 04/26/2015   2011 Multivessel DES PCI LAD,RCA PLOM and distal RCA   . Coronary artery disease   . Dementia ([place])   . Essential hypertension 04/21/2014  . Hyperlipidemia   . Parkinson's disease ([place]) 04/27/2015   2016   . Paroxysmal atrial fibrillation ([place]) 03/17/2018  . 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 Stented coronary artery 2011   x3    Past Surgical History:  Procedure Laterality Date  . APPENDECTOMY    . CARDIAC CATHETERIZATION  2011   3 stents  . COLONOSCOPY    . CORONARY ARTERY BYPASS GRAFT N/A 02/25/2018   Procedure: CORONARY ARTERY BYPASS GRAFTING (CABG), ON PUMP, TIMES TWO, USING LEFT INTERNAL MAMMARY ARTERY AND ENDOSCOPICALLY HARVESTED RIGHT GREATER SAPHENOUS VEIN;  Surgeon: Rexene Alberts, MD;  Location: [facility];  Service: Open Heart Surgery;  Laterality: N/A;  . RIGHT/LEFT HEART CATH AND CORONARY ANGIOGRAPHY N/A 02/24/2018   Procedure: RIGHT/LEFT HEART CATH AND CORONARY ANGIOGRAPHY;  Surgeon: Belva Crome, MD;  Location: [facility];  Service: Cardiovascular;   Laterality: N/A;  . TEE WITHOUT CARDIOVERSION N/A 02/25/2018   Procedure: TRANSESOPHAGEAL ECHOCARDIOGRAM (TEE);  Surgeon: Rexene Alberts, MD;  Location: [facility];  Service: Open Heart Surgery;  Laterality: N/A;  . TONSILLECTOMY      There were no vitals filed for this visit.  Subjective Assessment - 04/07/20 0838    Subjective  i am doing boxing for Parkinsons. once I get warmed up my back feels better at the class. I had to go to the car after 43minutes of grocerry shopping.    Patient is accompained by:  Family member   Wife   Currently in Pain?  No/denies                        [facility] Adult PT Treatment/Exercise - 04/07/20 0001      Ambulation/Gait   Ambulation Distance (Feet)  522 Feet   3 min 58 sec     Lumbar Exercises: Stretches   Active Hamstring Stretch  20 seconds;2 reps;Right;Left    Active Hamstring Stretch Limitations  seated with strap for DF    Single Knee to Chest Stretch  Right;Left;2 reps;30 seconds    Lower Trunk Rotation  10 seconds    Lower Trunk Rotation Limitations  10 reps    Hip Flexor Stretch  1 rep;2 reps;60  seconds    Standing Extension  5 reps;5 seconds      Lumbar Exercises: Aerobic   Nustep  L5 UE/LE x 5 minutes       Lumbar Exercises: Standing   Row  20 reps    Theraband Level (Row)  Level 3 (Green)      Lumbar Exercises: Seated   Sit to Stand  10 reps    Sit to Stand Limitations  cues to fully extend and engage gluteals       Lumbar Exercises: Supine   Pelvic Tilt  10 reps    Bent Knee Raise  20 reps    Bridge  20 reps               PT Short Term Goals - 04/07/20 0929      PT SHORT TERM GOAL #1   Title  He will be independent with intial HEP    Time  3    Period  Weeks    Status  Achieved      PT SHORT TERM GOAL #2   Title  He will report able to walk > 1/2 block before increased pain    Baseline  pt reports 10 minutes max with walking/shopping prior rest break due to pain    Time  3    Period  Weeks     Status  On-going        PT Long Term Goals - 03/16/20 0919      PT LONG TERM GOAL #1   Title  He will be indpeendent  with all HEP issued    Time  6    Period  Weeks    Status  New      PT LONG TERM GOAL #2   Title  He will report able to walk > 1 block before increased back pain.    Time  6    Period  Weeks    Status  New      PT LONG TERM GOAL #3   Title  He will report 50% less m PAIN/STIFFNESS  with exercise    Time  6    Period  Weeks    Status  New      PT LONG TERM GOAL #4   Title  He will report 50% less pain and stiffness with home activity.    Time  6    Period  Weeks    Status  New      PT LONG TERM GOAL #5   Title  FOTO score improved  to 40% limited    Time  6    Period  Weeks    Status  New            Plan - 04/07/20 0930    Clinical Impression Statement  Pt reports pain with standing and walking is unchanged. Pain level 5/10 and constant during gait in clinic. Pain did not rise however patient requested to sit down. Continued with core and Trunk ROM.    PT Next Visit Plan  Start cores stability and manual for ROM and pain. modalities    PT Home Exercise Plan  [phone]: bridge, pelvic tilit, hip flexor stretch, hamstring stretch seated   shoulder bridge,   walkk PPT       Patient will benefit from skilled therapeutic intervention in order to improve the following deficits and impairments:  Pain, Postural dysfunction, Decreased activity tolerance, Decreased range of motion, Difficulty walking  Visit Diagnosis:  Chronic bilateral low back pain, unspecified whether sciatica present  Joint stiffness of spine  Abnormal posture  Difficulty in walking, not elsewhere classified     Problem List Patient Active Problem List   Diagnosis Date Noted  . Paroxysmal atrial fibrillation ([place]) 03/17/2018  . 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 CAD (coronary artery disease) 02/24/2018  . Dementia ([place])   . 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  Parkinson's disease ([place]) 04/27/2015  . Essential hypertension   . Acute on chronic diastolic heart failure ([place]) 06/27/2008  . Hyperlipidemia   . ACUTE SINUSITIS, UNSPECIFIED 11/24/2007  . CONJUNCTIVITIS, ACUTE NOS 05/06/2007    Dorene Ar , PTA 04/07/2020, 9:32 AM  [facility] [address] [place], [place], [address] Phone: [phone]   Fax:  [phone]  Name: Calvin Ramirez MRN: [id number] Date of Birth: 20-Nov-1941

## 2020-04-10 ENCOUNTER — Ambulatory Visit: Payer: Medicare Other

## 2020-04-10 ENCOUNTER — Other Ambulatory Visit: Payer: Self-pay

## 2020-04-10 DIAGNOSIS — R293 Abnormal posture: Secondary | ICD-10-CM

## 2020-04-10 DIAGNOSIS — M2560 Stiffness of unspecified joint, not elsewhere classified: Secondary | ICD-10-CM | POA: Diagnosis not present

## 2020-04-10 DIAGNOSIS — G8929 Other chronic pain: Secondary | ICD-10-CM

## 2020-04-10 DIAGNOSIS — R262 Difficulty in walking, not elsewhere classified: Secondary | ICD-10-CM | POA: Diagnosis not present

## 2020-04-10 DIAGNOSIS — M545 Low back pain, unspecified: Secondary | ICD-10-CM

## 2020-04-10 NOTE — Patient Instructions (Signed)
Green band row and extension x 10-20 reps  Daily bilaterally

## 2020-04-10 NOTE — Therapy (Signed)
[place], [place], [address] Phone: [phone]   Fax:  [phone]  Physical Therapy Treatment  Patient Details  Name: Calvin Ramirez MRN: [id number] Date of Birth: 06/28/1941 Referring Provider (PT): Shirline Frees, MD   Encounter Date: 04/10/2020  PT End of Session - 04/10/20 0838    Visit Number  6    Number of Visits  12    Date for PT Re-Evaluation  04/28/20    Authorization Type  MDR    Authorization Time Period  Progress visit 10    KX visit 15    PT Start Time  0835    PT Stop Time  0915    PT Time Calculation (min)  40 min    Activity Tolerance  Patient tolerated treatment well;No increased pain    Behavior During Therapy  WFL for tasks assessed/performed       Past Medical History:  Diagnosis Date  . Acute diastolic (congestive) heart failure ([place]) 02/13/2018  . Arthritis   . 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 CAD (coronary artery disease) 02/24/2018  . CAD in native artery 04/26/2015   2011 Multivessel DES PCI LAD,RCA PLOM and distal RCA   . Coronary artery disease   . Dementia ([place])   . Essential hypertension 04/21/2014  . Hyperlipidemia   . Parkinson's disease ([place]) 04/27/2015   2016   . Paroxysmal atrial fibrillation ([place]) 03/17/2018  . 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 Stented coronary artery 2011   x3    Past Surgical History:  Procedure Laterality Date  . APPENDECTOMY    . CARDIAC CATHETERIZATION  2011   3 stents  . COLONOSCOPY    . CORONARY ARTERY BYPASS GRAFT N/A 02/25/2018   Procedure: CORONARY ARTERY BYPASS GRAFTING (CABG), ON PUMP, TIMES TWO, USING LEFT INTERNAL MAMMARY ARTERY AND ENDOSCOPICALLY HARVESTED RIGHT GREATER SAPHENOUS VEIN;  Surgeon: Rexene Alberts, MD;  Location: [facility];  Service: Open Heart Surgery;  Laterality: N/A;  . RIGHT/LEFT HEART CATH AND CORONARY ANGIOGRAPHY N/A 02/24/2018   Procedure:  RIGHT/LEFT HEART CATH AND CORONARY ANGIOGRAPHY;  Surgeon: Belva Crome, MD;  Location: [facility];  Service: Cardiovascular;  Laterality: N/A;  . TEE WITHOUT CARDIOVERSION N/A 02/25/2018   Procedure: TRANSESOPHAGEAL ECHOCARDIOGRAM (TEE);  Surgeon: Rexene Alberts, MD;  Location: [facility];  Service: Open Heart Surgery;  Laterality: N/A;  . TONSILLECTOMY      There were no vitals filed for this visit.  Subjective Assessment - 04/10/20 0839    Subjective  No pain to start..    Patient is accompained by:  Family member    How long can you walk comfortably?  20 min now on feet    Diagnostic tests  Xrays: healed comp FX, retro listhesis Gr 1    Patient Stated Goals  Decrease pain with walking    Currently in Pain?  No/denies                        [facility] Adult PT Treatment/Exercise - 04/10/20 0001      Lumbar Exercises: Stretches   Single Knee to Chest Stretch  Right;Left;2 reps;30 seconds    Lower Trunk Rotation Limitations  10 reps      Lumbar Exercises: Standing   Row  20 reps    Theraband Level (Row)  Level 3 (Green)  Shoulder Extension  20 reps    Theraband Level (Shoulder Extension)  Level 3 (Green)    Other Standing Lumbar Exercises  lean on counter x 20 RT and LT hip ext,   lumbar ext x 10 hips on counter      Lumbar Exercises: Supine   Pelvic Tilt  10 reps    Bent Knee Raise  15 reps    Bridge  15 reps      Discussed possible cause LBP and how exercise may improve tolerance on feet.        PT Education - 04/10/20 0932    Education Details  band exer,  sit if back too uncomforetable standing    Person(s) Educated  Patient    Methods  Explanation;Demonstration;Verbal cues;Handout    Comprehension  Returned demonstration;Verbalized understanding       PT Short Term Goals - 04/10/20 [id number]      PT SHORT TERM GOAL #1   Title  He will be independent with intial HEP    Status  Achieved      PT SHORT TERM GOAL #2   Title  He will report able  to walk > 1/2 block before increased pain    Baseline  report 20 min  now    Status  Achieved        PT Long Term Goals - 03/16/20 0919      PT LONG TERM GOAL #1   Title  He will be indpeendent  with all HEP issued    Time  6    Period  Weeks    Status  New      PT LONG TERM GOAL #2   Title  He will report able to walk > 1 block before increased back pain.    Time  6    Period  Weeks    Status  New      PT LONG TERM GOAL #3   Title  He will report 50% less m PAIN/STIFFNESS  with exercise    Time  6    Period  Weeks    Status  New      PT LONG TERM GOAL #4   Title  He will report 50% less pain and stiffness with home activity.    Time  6    Period  Weeks    Status  New      PT LONG TERM GOAL #5   Title  FOTO score improved  to 40% limited    Time  6    Period  Weeks    Status  New            Plan - 04/10/20 [id number]    Clinical Impression Statement  He reports improvment  in walking tolerance. He reports not doing HEP daily as he also goes to boxing and stretches there.  He overall feels improved    PT Treatment/Interventions  Moist Heat;Electrical Stimulation;Traction;Therapeutic exercise;Manual techniques;Patient/family education;Dry needling;Passive range of motion    PT Next Visit Plan  Start cores stability and manual for ROM and pain. modalities    PT Home Exercise Plan  [phone]: bridge, pelvic tilit, hip flexor stretch, hamstring stretch seated   shoulder bridge,   walkk PPT,  Green band row ands extension    Consulted and Agree with Plan of Care  Patient       Patient will benefit from skilled therapeutic intervention in order to improve the following deficits and impairments:  Pain, Postural dysfunction,  Decreased activity tolerance, Decreased range of motion, Difficulty walking  Visit Diagnosis: Chronic bilateral low back pain, unspecified whether sciatica present  Joint stiffness of spine  Abnormal posture  Difficulty in walking, not elsewhere  classified     Problem List Patient Active Problem List   Diagnosis Date Noted  . Paroxysmal atrial fibrillation ([place]) 03/17/2018  . 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 CAD (coronary artery disease) 02/24/2018  . Dementia ([place])   . 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 Parkinson's disease ([place]) 04/27/2015  . Essential hypertension   . Acute on chronic diastolic heart failure ([place]) 06/27/2008  . Hyperlipidemia   . ACUTE SINUSITIS, UNSPECIFIED 11/24/2007  . CONJUNCTIVITIS, ACUTE NOS 05/06/2007    Darrel Hoover PT 04/10/2020, 9:34 AM  [facility] [address] [place], [place], [address] Phone: [phone]   Fax:  [phone]  Name: Calvin Ramirez MRN: [id number] Date of Birth: [birth year]

## 2020-04-14 ENCOUNTER — Ambulatory Visit: Payer: Medicare Other | Admitting: Physical Therapy

## 2020-04-14 ENCOUNTER — Encounter: Payer: Self-pay | Admitting: Physical Therapy

## 2020-04-14 ENCOUNTER — Other Ambulatory Visit: Payer: Self-pay

## 2020-04-14 DIAGNOSIS — G8929 Other chronic pain: Secondary | ICD-10-CM | POA: Diagnosis not present

## 2020-04-14 DIAGNOSIS — R262 Difficulty in walking, not elsewhere classified: Secondary | ICD-10-CM

## 2020-04-14 DIAGNOSIS — M545 Low back pain, unspecified: Secondary | ICD-10-CM

## 2020-04-14 DIAGNOSIS — M2560 Stiffness of unspecified joint, not elsewhere classified: Secondary | ICD-10-CM

## 2020-04-14 DIAGNOSIS — R293 Abnormal posture: Secondary | ICD-10-CM

## 2020-04-14 NOTE — Therapy (Signed)
[place], [place], [address] Phone: [phone]   Fax:  [phone]  Physical Therapy Treatment  Patient Details  Name: Calvin Ramirez MRN: [id number] Date of Birth: 05/27/1941 Referring Provider (PT): Shirline Frees, MD   Encounter Date: 04/14/2020  PT End of Session - 04/14/20 0850    Visit Number  7    Number of Visits  12    Date for PT Re-Evaluation  04/28/20    Authorization Type  MDR    Authorization Time Period  Progress visit 10    KX visit 15    PT Start Time  0845    PT Stop Time  0925    PT Time Calculation (min)  40 min       Past Medical History:  Diagnosis Date  . Acute diastolic (congestive) heart failure ([place]) 02/13/2018  . Arthritis   . 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 CAD (coronary artery disease) 02/24/2018  . CAD in native artery 04/26/2015   2011 Multivessel DES PCI LAD,RCA PLOM and distal RCA   . Coronary artery disease   . Dementia ([place])   . Essential hypertension 04/21/2014  . Hyperlipidemia   . Parkinson's disease ([place]) 04/27/2015   2016   . Paroxysmal atrial fibrillation ([place]) 03/17/2018  . 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 Stented coronary artery 2011   x3    Past Surgical History:  Procedure Laterality Date  . APPENDECTOMY    . CARDIAC CATHETERIZATION  2011   3 stents  . COLONOSCOPY    . CORONARY ARTERY BYPASS GRAFT N/A 02/25/2018   Procedure: CORONARY ARTERY BYPASS GRAFTING (CABG), ON PUMP, TIMES TWO, USING LEFT INTERNAL MAMMARY ARTERY AND ENDOSCOPICALLY HARVESTED RIGHT GREATER SAPHENOUS VEIN;  Surgeon: Rexene Alberts, MD;  Location: [facility];  Service: Open Heart Surgery;  Laterality: N/A;  . RIGHT/LEFT HEART CATH AND CORONARY ANGIOGRAPHY N/A 02/24/2018   Procedure: RIGHT/LEFT HEART CATH AND CORONARY ANGIOGRAPHY;  Surgeon: Belva Crome, MD;  Location: [facility];  Service: Cardiovascular;   Laterality: N/A;  . TEE WITHOUT CARDIOVERSION N/A 02/25/2018   Procedure: TRANSESOPHAGEAL ECHOCARDIOGRAM (TEE);  Surgeon: Rexene Alberts, MD;  Location: [facility];  Service: Open Heart Surgery;  Laterality: N/A;  . TONSILLECTOMY      There were no vitals filed for this visit.  Subjective Assessment - 04/14/20 0850    Subjective  Pain nothing substantial. ABle to lift some book cases last night which was borderline too much. Pain is gone today.    Currently in Pain?  No/denies                        [facility] Adult PT Treatment/Exercise - 04/14/20 0001      Ambulation/Gait   Ambulation Distance (Feet)  1101 Feet   6 min 10sec   Gait Comments  3/10 pain at 725ft (4 minutes)   4/10 pain at end of gait      Lumbar Exercises: Stretches   Active Hamstring Stretch  3 reps;20 seconds    Active Hamstring Stretch Limitations  seated     Gastroc Stretch Limitations  bilat toes on slant board x 30 sec       Lumbar Exercises: Aerobic   Nustep  L5 UE/LE x 5 minutes       Lumbar Exercises: Standing   Row  20  reps    Theraband Level (Row)  Level 3 (Green)    Shoulder Extension  20 reps    Theraband Level (Shoulder Extension)  Level 3 (Green)      Lumbar Exercises: Seated   Sit to Stand  10 reps      Lumbar Exercises: Supine   Bent Knee Raise  20 reps    Bridge  20 reps    Straight Leg Raise  10 reps    Straight Leg Raises Limitations  with cues with abdominal draw in                PT Short Term Goals - 04/10/20 [id number]      PT SHORT TERM GOAL #1   Title  He will be independent with intial HEP    Status  Achieved      PT SHORT TERM GOAL #2   Title  He will report able to walk > 1/2 block before increased pain    Baseline  report 20 min  now    Status  Achieved        PT Long Term Goals - 03/16/20 0919      PT LONG TERM GOAL #1   Title  He will be indpeendent  with all HEP issued    Time  6    Period  Weeks    Status  New      PT LONG TERM GOAL #2    Title  He will report able to walk > 1 block before increased back pain.    Time  6    Period  Weeks    Status  New      PT LONG TERM GOAL #3   Title  He will report 50% less m PAIN/STIFFNESS  with exercise    Time  6    Period  Weeks    Status  New      PT LONG TERM GOAL #4   Title  He will report 50% less pain and stiffness with home activity.    Time  6    Period  Weeks    Status  New      PT LONG TERM GOAL #5   Title  FOTO score improved  to 40% limited    Time  6    Period  Weeks    Status  New            Plan - 04/14/20 [id number]    Clinical Impression Statement  Increased gait distance today prior to onset of pain. Encouraged him to walk for 5 minutes in driveway 2 x per day. Wife expressed concern with his compliance.    PT Next Visit Plan  Start cores stability and manual for ROM and pain. modalities, NEEDS FOTO status,    PT Home Exercise Plan  [phone]: bridge, pelvic tilit, hip flexor stretch, hamstring stretch seated   shoulder bridge,   walkk PPT,  Green band row ands extension       Patient will benefit from skilled therapeutic intervention in order to improve the following deficits and impairments:  Pain, Postural dysfunction, Decreased activity tolerance, Decreased range of motion, Difficulty walking  Visit Diagnosis: Chronic bilateral low back pain, unspecified whether sciatica present  Joint stiffness of spine  Abnormal posture  Difficulty in walking, not elsewhere classified     Problem List Patient Active Problem List   Diagnosis Date Noted  . Paroxysmal atrial fibrillation ([place]) 03/17/2018  . S/P CABG x  2 02/25/2018  . CAD (coronary artery disease) 02/24/2018  . Dementia ([place])   . 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 Parkinson's disease ([place]) 04/27/2015  . Essential hypertension   . Acute on chronic diastolic heart failure ([place]) 06/27/2008  . Hyperlipidemia   . ACUTE SINUSITIS, UNSPECIFIED 11/24/2007  . CONJUNCTIVITIS,  ACUTE NOS 05/06/2007    Dorene Ar, PTA 04/14/2020, 10:47 AM  [facility] [address] [place], [place], [address] Phone: [phone]   Fax:  [phone]  Name: Calvin Ramirez MRN: [id number] Date of Birth: Mar 20, 1941

## 2020-04-17 ENCOUNTER — Ambulatory Visit: Payer: Medicare Other

## 2020-04-17 ENCOUNTER — Other Ambulatory Visit: Payer: Self-pay

## 2020-04-17 DIAGNOSIS — R262 Difficulty in walking, not elsewhere classified: Secondary | ICD-10-CM | POA: Diagnosis not present

## 2020-04-17 DIAGNOSIS — M2560 Stiffness of unspecified joint, not elsewhere classified: Secondary | ICD-10-CM | POA: Diagnosis not present

## 2020-04-17 DIAGNOSIS — R293 Abnormal posture: Secondary | ICD-10-CM

## 2020-04-17 DIAGNOSIS — G8929 Other chronic pain: Secondary | ICD-10-CM | POA: Diagnosis not present

## 2020-04-17 DIAGNOSIS — M545 Low back pain, unspecified: Secondary | ICD-10-CM

## 2020-04-17 NOTE — Therapy (Addendum)
[place], [place], [address] Phone: [phone]   Fax:  [phone]  Physical Therapy Treatment/Discharge  Patient Details  Name: Calvin Ramirez MRN: [id number] Date of Birth: Feb 17, 1941 Referring Provider (PT): Shirline Frees, MD   Encounter Date: 04/17/2020  PT End of Session - 04/17/20 0823    Visit Number  8    Number of Visits  12    Date for PT Re-Evaluation  04/28/20    Authorization Type  MCR    Authorization Time Period  Progress visit 10    KX visit 15    PT Start Time  0830    PT Stop Time  0915    PT Time Calculation (min)  45 min    Activity Tolerance  Patient tolerated treatment well;No increased pain    Behavior During Therapy  WFL for tasks assessed/performed       Past Medical History:  Diagnosis Date  . Acute diastolic (congestive) heart failure ([place]) 02/13/2018  . Arthritis   . 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 CAD (coronary artery disease) 02/24/2018  . CAD in native artery 04/26/2015   2011 Multivessel DES PCI LAD,RCA PLOM and distal RCA   . Coronary artery disease   . Dementia ([place])   . Essential hypertension 04/21/2014  . Hyperlipidemia   . Parkinson's disease ([place]) 04/27/2015   2016   . Paroxysmal atrial fibrillation ([place]) 03/17/2018  . 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 Stented coronary artery 2011   x3    Past Surgical History:  Procedure Laterality Date  . APPENDECTOMY    . CARDIAC CATHETERIZATION  2011   3 stents  . COLONOSCOPY    . CORONARY ARTERY BYPASS GRAFT N/A 02/25/2018   Procedure: CORONARY ARTERY BYPASS GRAFTING (CABG), ON PUMP, TIMES TWO, USING LEFT INTERNAL MAMMARY ARTERY AND ENDOSCOPICALLY HARVESTED RIGHT GREATER SAPHENOUS VEIN;  Surgeon: Rexene Alberts, MD;  Location: [facility];  Service: Open Heart Surgery;  Laterality: N/A;  . RIGHT/LEFT HEART CATH AND CORONARY ANGIOGRAPHY N/A 02/24/2018   Procedure:  RIGHT/LEFT HEART CATH AND CORONARY ANGIOGRAPHY;  Surgeon: Belva Crome, MD;  Location: [facility];  Service: Cardiovascular;  Laterality: N/A;  . TEE WITHOUT CARDIOVERSION N/A 02/25/2018   Procedure: TRANSESOPHAGEAL ECHOCARDIOGRAM (TEE);  Surgeon: Rexene Alberts, MD;  Location: [facility];  Service: Open Heart Surgery;  Laterality: N/A;  . TONSILLECTOMY      There were no vitals filed for this visit.  Subjective Assessment - 04/17/20 0833    Subjective  Overall better. Takes meds and it helps.   He cannot walk better or farther.         [facility] PT Assessment - 04/17/20 0001      Observation/Other Assessments   Focus on Therapeutic Outcomes (FOTO)   43% limiteds  with spouse helping                     [facility] PT Treatment/Exercise - 04/17/20 0001      Ambulation/Gait   Gait Comments  worked on use of walking poles. Spouse stated she was told that he should not walk with poles but this was possibly beneficial  as he gets some UE support for trunk but allows body rotation /soordinatin.      Lumbar Exercises: Standing   Row  20 reps    Theraband Level (Row)  Level 3 (Green)    Shoulder Extension  20 reps    Theraband Level (Shoulder Extension)  Level 3 (Green)      Lumbar Exercises: Seated   Sit to Stand  10 reps      Lumbar Exercises: Supine   Pelvic Tilt  15 reps;10 seconds    Bent Knee Raise  20 reps    Bridge  20 reps    Bridge Limitations  3-5 sec hold       Discussed rolling to get out of bed and demo by PT        PT Short Term Goals - 04/10/20 2542      PT SHORT TERM GOAL #1   Title  He will be independent with intial HEP    Status  Achieved      PT SHORT TERM GOAL #2   Title  He will report able to walk > 1/2 block before increased pain    Baseline  report 20 min  now    Status  Achieved        PT Long Term Goals - 04/17/20 0834      PT LONG TERM GOAL #1   Title  He will be indpeendent  with all HEP issued    Status   On-going      PT LONG TERM GOAL #2   Title  He will report able to walk > 1 block before increased back pain. level surface    Status  Achieved      PT LONG TERM GOAL #3   Title  He will report 50% less m PAIN/STIFFNESS  with exercise    Status  On-going      PT LONG TERM GOAL #4   Title  He will report 50% less pain and stiffness with home activity.      PT LONG TERM GOAL #5   Title  FOTO score improved  to 40% limited    Baseline  57% limited  spouse assisted so may be more accurate            Plan - 04/17/20 7062    Clinical Impression Statement  FOTO worse but may be more accurate as spouse helped . No increased pain . discussed witrh model possible causes of LBP with standing and walking.,    PT Treatment/Interventions  Moist Heat;Electrical Stimulation;Traction;Therapeutic exercise;Manual techniques;Patient/family education;Dry needling;Passive range of motion    PT Next Visit Plan  Start cores stability and manual for ROM and pain. modalities    PT Home Exercise Plan  [phone]: bridge, pelvic tilit, hip flexor stretch, hamstring stretch seated   shoulder bridge,   walk PPT,  Green band row ands extension    Consulted and Agree with Plan of Care  Patient       Patient will benefit from skilled therapeutic intervention in order to improve the following deficits and impairments:  Pain, Postural dysfunction, Decreased activity tolerance, Decreased range of motion, Difficulty walking  Visit Diagnosis: Chronic bilateral low back pain, unspecified whether sciatica present  Joint stiffness of spine  Difficulty in walking, not elsewhere classified  Abnormal posture     Problem List Patient Active Problem List   Diagnosis Date Noted  . Paroxysmal atrial fibrillation ([place]) 03/17/2018  . 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 CAD (coronary artery disease) 02/24/2018  . Dementia ([place])   . Bilateral iliac artery aneurysm ([place]) 09/09/2016  . Parkinson's disease ([place]) 04/27/2015  .  Essential hypertension   .  Acute on chronic diastolic heart failure ([place]) 06/27/2008  . Hyperlipidemia   . ACUTE SINUSITIS, UNSPECIFIED 11/24/2007  . CONJUNCTIVITIS, ACUTE NOS 05/06/2007    Darrel Hoover  PT 04/17/2020, 10:02 AM  [facility] [address] [place], [place], [address] Phone: [phone]   Fax:  [phone]  Name: Calvin Ramirez MRN: [id number] Date of Birth: 1941/03/06  PHYSICAL THERAPY DISCHARGE SUMMARY  Visits from Start of Care: 8  Current functional level related to goals / functional outcomes: Unknown as pt did not return after this visit.    Remaining deficits: Facilities manager / Equipment: HEP  Plan:                                                    Patient goals were not met. Patient is being discharged due to not returning since the last visit.  ?????    Pearson Forster  PT  06/01/20

## 2020-06-01 ENCOUNTER — Telehealth: Payer: Self-pay | Admitting: Physical Therapy

## 2020-06-01 DIAGNOSIS — I2510 Atherosclerotic heart disease of native coronary artery without angina pectoris: Secondary | ICD-10-CM | POA: Diagnosis not present

## 2020-06-01 DIAGNOSIS — I10 Essential (primary) hypertension: Secondary | ICD-10-CM | POA: Diagnosis not present

## 2020-06-01 DIAGNOSIS — E7800 Pure hypercholesterolemia, unspecified: Secondary | ICD-10-CM | POA: Diagnosis not present

## 2020-06-01 DIAGNOSIS — M545 Low back pain: Secondary | ICD-10-CM | POA: Diagnosis not present

## 2020-06-01 DIAGNOSIS — G20 Parkinson's disease: Secondary | ICD-10-CM | POA: Diagnosis not present

## 2020-06-01 NOTE — Telephone Encounter (Signed)
Message left about discharge and need for new order if he wants to return.

## 2020-06-09 ENCOUNTER — Telehealth: Payer: Self-pay | Admitting: Interventional Cardiology

## 2020-06-09 NOTE — Telephone Encounter (Signed)
      I went in pt's chart to see when he needs his next office visit. I made him an appt for 08-10-20 with Dr Daneen Schick

## 2020-06-22 ENCOUNTER — Other Ambulatory Visit: Payer: Self-pay | Admitting: Interventional Cardiology

## 2020-08-09 NOTE — Progress Notes (Signed)
Cardiology Office Note:    Date:  08/10/2020   ID:  Calvin Ramirez, DOB 04-27-1941, MRN [id number]  PCP:  Shirline Frees, MD  Cardiologist:  Sinclair Grooms, MD   Referring MD: Shirline Frees, MD   Chief Complaint  Patient presents with  . Coronary Artery Disease  . Congestive Heart Failure    History of Present Illness:    Calvin Ramirez is a 79 y.o. male with a hx of  CAD, recent CABG with LIMA to LAD and SVG to OM for left main disease(April 2018), hyperlipidemia, Parkinson's disease, postop atrial fibrillation, essential hypertension, and diastolic heart failure in the setting of acute ischemia caused by left main disease.Recent acute on chronic combined systolic and diastolic HF treated with diuretics.  He is doing okay.  He denies shortness of breath and angina.  His wife is concerned about his memory.  She is concerned about decreased sex drive and function.  Wife wonders if I should stop some of his medications to see if that would help his memory.  Past Medical History:  Diagnosis Date  . Acute diastolic (congestive) heart failure ([place]) 02/13/2018  . Arthritis   . 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 CAD (coronary artery disease) 02/24/2018  . CAD in native artery 04/26/2015   2011 Multivessel DES PCI LAD,RCA PLOM and distal RCA   . Coronary artery disease   . Dementia ([place])   . Essential hypertension 04/21/2014  . Hyperlipidemia   . Parkinson's disease ([place]) 04/27/2015   2016   . Paroxysmal atrial fibrillation ([place]) 03/17/2018  . 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 Stented coronary artery 2011   x3    Past Surgical History:  Procedure Laterality Date  . APPENDECTOMY    . CARDIAC CATHETERIZATION  2011   3 stents  . COLONOSCOPY    . CORONARY ARTERY BYPASS GRAFT N/A 02/25/2018   Procedure: CORONARY ARTERY BYPASS GRAFTING (CABG), ON PUMP, TIMES TWO, USING LEFT INTERNAL MAMMARY ARTERY AND  ENDOSCOPICALLY HARVESTED RIGHT GREATER SAPHENOUS VEIN;  Surgeon: Rexene Alberts, MD;  Location: [facility];  Service: Open Heart Surgery;  Laterality: N/A;  . RIGHT/LEFT HEART CATH AND CORONARY ANGIOGRAPHY N/A 02/24/2018   Procedure: RIGHT/LEFT HEART CATH AND CORONARY ANGIOGRAPHY;  Surgeon: Belva Crome, MD;  Location: [facility];  Service: Cardiovascular;  Laterality: N/A;  . TEE WITHOUT CARDIOVERSION N/A 02/25/2018   Procedure: TRANSESOPHAGEAL ECHOCARDIOGRAM (TEE);  Surgeon: Rexene Alberts, MD;  Location: [facility];  Service: Open Heart Surgery;  Laterality: N/A;  . TONSILLECTOMY      Current Medications: Current Meds  Medication Sig  . aspirin EC 81 MG tablet Take 1 tablet (81 mg total) by mouth daily.  . Cyanocobalamin (VITAMIN B-12 PO) Take 1,000 mg by mouth daily.  Marland Kitchen donepezil (ARICEPT) 5 MG tablet Take 1 tablet by mouth daily.  . furosemide (LASIX) 40 MG tablet Take 1 tablet (40 mg total) by mouth daily.  . Melatonin 5 MG CAPS Take 1 capsule by mouth as needed (sleep).  . metoprolol tartrate (LOPRESSOR) 25 MG tablet Take 0.5 tablets (12.5 mg total) by mouth 2 (two) times daily.  . rosuvastatin (CRESTOR) 20 MG tablet TAKE 1 TABLET(20 MG) BY MOUTH DAILY  . spironolactone (ALDACTONE) 25 MG tablet Take 0.5 tablets (12.5 mg total) by mouth daily.  . Turmeric (QC TUMERIC COMPLEX PO) Take 1 tablet by mouth daily.  Marland Kitchen  VITAMIN D PO Take 1,000 mcg by mouth daily.  . [DISCONTINUED] sertraline (ZOLOFT) 25 MG tablet Take 25 mg by mouth daily.     Allergies:   Levaquin [levofloxacin hemihydrate] and Penicillins   Social History   Socioeconomic History  . Marital status: Married    Spouse name: Not on file  . Number of children: Not on file  . Years of education: Not on file  . Highest education level: Not on file  Occupational History  . Not on file  Tobacco Use  . Smoking status: Never Smoker  . Smokeless tobacco: Never Used  Vaping Use  . Vaping Use: Never used  Substance and  Sexual Activity  . Alcohol use: Yes    Alcohol/week: 0.0 standard drinks    Comment: occ  . Drug use: No  . Sexual activity: Not on file  Other Topics Concern  . Not on file  Social History Narrative  . Not on file   Social Determinants of Health   Financial Resource Strain:   . Difficulty of Paying Living Expenses: Not on file  Food Insecurity:   . Worried About Charity fundraiser in the Last Year: Not on file  . Ran Out of Food in the Last Year: Not on file  Transportation Needs:   . Lack of Transportation (Medical): Not on file  . Lack of Transportation (Non-Medical): Not on file  Physical Activity:   . Days of Exercise per Week: Not on file  . Minutes of Exercise per Session: Not on file  Stress:   . Feeling of Stress : Not on file  Social Connections:   . Frequency of Communication with Friends and Family: Not on file  . Frequency of Social Gatherings with Friends and Family: Not on file  . Attends Religious Services: Not on file  . Active Member of Clubs or Organizations: Not on file  . Attends Archivist Meetings: Not on file  . Marital Status: Not on file     Family History: The patient's family history includes Cancer in his father; Heart attack in his mother.  ROS:   Please see the history of present illness.    Wife tells me that he had sexual intercourse on his 79th birthday.  He has difficulty with his memory.  His back is given trouble and preventing him from walking on a regular basis.  If he stands too long the back of his calves ache in his legs develop numbness.  All other systems reviewed and are negative.  EKGs/Labs/Other Studies Reviewed:    The following studies were reviewed today: No new imaging  EKG:  EKG performed October 25, 2019 demonstrates normal sinus rhythm/sinus bradycardia with QS pattern V1 through V3.  Otherwise unremarkable.  Recent Labs: 12/13/2019: BUN 26; Creatinine, Ser 1.19; Potassium 4.7; Sodium 140  Recent Lipid  Panel    Component Value Date/Time   CHOL 147 06/04/2019 0739   TRIG 122 06/04/2019 0739   HDL 38 (L) 06/04/2019 0739   CHOLHDL 3.9 06/04/2019 0739   CHOLHDL 6.2 (H) 09/06/2016 0952   VLDL 57 (H) 09/06/2016 0952   LDLCALC 85 06/04/2019 0739   LDLDIRECT 98.0 04/21/2015 0858    Physical Exam:    VS:  BP 116/60   Pulse 61   Ht 5\' 10"  (1.778 m)   Wt 198 lb (89.8 kg)   SpO2 97%   BMI 28.41 kg/m     Wt Readings from Last 3 Encounters:  08/10/20 198  lb (89.8 kg)  01/26/20 200 lb 3.2 oz (90.8 kg)  12/13/19 198 lb (89.8 kg)     GEN: Appears healthy. No acute distress HEENT: Normal NECK: No JVD. LYMPHATICS: No lymphadenopathy CARDIAC:  RRR without murmur, gallop, or edema. VASCULAR:  Normal Pulses. No bruits. RESPIRATORY:  Clear to auscultation without rales, wheezing or rhonchi  ABDOMEN: Soft, non-tender, non-distended, No pulsatile mass, MUSCULOSKELETAL: No deformity  SKIN: Warm and dry NEUROLOGIC:  Alert and oriented x 3.  Decreased memory and conversation is reduced possibly because of decreased hearing. PSYCHIATRIC:  Normal affect   ASSESSMENT:    1. Chronic diastolic CHF (congestive heart failure) ([place])   2. Coronary artery disease involving coronary bypass graft of native heart without angina pectoris   3. Paroxysmal atrial fibrillation ([facility])   4. Essential hypertension   5. Educated about COVID-19 virus infection   6. Hyperlipidemia, unspecified hyperlipidemia type   7. Dementia with behavioral disturbance, unspecified dementia type ([place])    PLAN:    In order of problems listed above:  1. Continue the current medical regimen which includes Aldactone 12.5 mg daily.  Also continue losartan and Lopressor. 2. Secondary prevention discussed. 3. No recurrent atrial arrhythmias. 4. Continue Lopressor, Cozaar, Lasix, and Aldactone at current doses. 5. He is vaccinated and practicing mitigation. 6. On Crestor 20 mg/day LDL is above target of 70.  We should probably  increase to 40 mg/day but the wife objects because of concerned about his memory. 7. We discussed that increasing memory difficulty.  I asked her to speak with his neurologist and if there are medications we are using that are aggravating his problem, I we will make adjustments.  This is Dr. Hall Busing and [place].  Overall education and awareness concerning primary/secondary risk prevention was discussed in detail: LDL less than 70, hemoglobin A1c less than 7, blood pressure target less than 130/80 mmHg, >150 minutes of moderate aerobic activity per week, avoidance of smoking, weight control (via diet and exercise), and continued surveillance/management of/for obstructive sleep apnea.    Medication Adjustments/Labs and Tests Ordered: Current medicines are reviewed at length with the patient today.  Concerns regarding medicines are outlined above.  No orders of the defined types were placed in this encounter.  No orders of the defined types were placed in this encounter.   Patient Instructions  Medication Instructions:  Your physician recommends that you continue on your current medications as directed. Please refer to the Current Medication list given to you today.  *If you need a refill on your cardiac medications before your next appointment, please call your pharmacy*   Lab Work: None If you have labs (blood work) drawn today and your tests are completely normal, you will receive your results only by: Marland Kitchen MyChart Message (if you have MyChart) OR . A paper copy in the mail If you have any lab test that is abnormal or we need to change your treatment, we will call you to review the results.   Testing/Procedures: None   Follow-Up: At [facility], you and your health needs are our priority.  As part of our continuing mission to provide you with exceptional heart care, we have created designated Provider Care Teams.  These Care Teams include your primary Cardiologist (physician) and  Advanced Practice Providers (APPs -  Physician Assistants and Nurse Practitioners) who all work together to provide you with the care you need, when you need it.  We recommend signing up for the patient portal called "MyChart".  Sign up information is provided on this After Visit Summary.  MyChart is used to connect with patients for Virtual Visits (Telemedicine).  Patients are able to view lab/test results, encounter notes, upcoming appointments, etc.  Non-urgent messages can be sent to your provider as well.   To learn more about what you can do with MyChart, go to [email or web address].    Your next appointment:   12 month(s)  The format for your next appointment:   In Person  Provider:   You may see Sinclair Grooms, MD or one of the following Advanced Practice Providers on your designated Care Team:    Truitt Merle, NP  Cecilie Kicks, NP  Kathyrn Drown, NP    Other Instructions      Signed, Sinclair Grooms, MD  08/10/2020 10:13 AM    [place]

## 2020-08-10 ENCOUNTER — Ambulatory Visit (INDEPENDENT_AMBULATORY_CARE_PROVIDER_SITE_OTHER): Payer: Medicare Other | Admitting: Interventional Cardiology

## 2020-08-10 ENCOUNTER — Encounter: Payer: Self-pay | Admitting: Interventional Cardiology

## 2020-08-10 ENCOUNTER — Other Ambulatory Visit: Payer: Self-pay

## 2020-08-10 VITALS — BP 116/60 | HR 61 | Ht 70.0 in | Wt 198.0 lb

## 2020-08-10 DIAGNOSIS — I10 Essential (primary) hypertension: Secondary | ICD-10-CM

## 2020-08-10 DIAGNOSIS — E785 Hyperlipidemia, unspecified: Secondary | ICD-10-CM

## 2020-08-10 DIAGNOSIS — I25810 Atherosclerosis of coronary artery bypass graft(s) without angina pectoris: Secondary | ICD-10-CM

## 2020-08-10 DIAGNOSIS — I480 Paroxysmal atrial fibrillation: Secondary | ICD-10-CM | POA: Diagnosis not present

## 2020-08-10 DIAGNOSIS — Z7189 Other specified counseling: Secondary | ICD-10-CM | POA: Diagnosis not present

## 2020-08-10 DIAGNOSIS — F0391 Unspecified dementia with behavioral disturbance: Secondary | ICD-10-CM

## 2020-08-10 DIAGNOSIS — I5032 Chronic diastolic (congestive) heart failure: Secondary | ICD-10-CM | POA: Diagnosis not present

## 2020-08-10 NOTE — Patient Instructions (Signed)

## 2020-08-31 DIAGNOSIS — Z23 Encounter for immunization: Secondary | ICD-10-CM | POA: Diagnosis not present

## 2020-09-05 DIAGNOSIS — E7800 Pure hypercholesterolemia, unspecified: Secondary | ICD-10-CM | POA: Diagnosis not present

## 2020-09-05 DIAGNOSIS — G20 Parkinson's disease: Secondary | ICD-10-CM | POA: Diagnosis not present

## 2020-09-05 DIAGNOSIS — G8929 Other chronic pain: Secondary | ICD-10-CM | POA: Diagnosis not present

## 2020-09-05 DIAGNOSIS — I2510 Atherosclerotic heart disease of native coronary artery without angina pectoris: Secondary | ICD-10-CM | POA: Diagnosis not present

## 2020-09-05 DIAGNOSIS — I10 Essential (primary) hypertension: Secondary | ICD-10-CM | POA: Diagnosis not present

## 2020-09-05 DIAGNOSIS — M545 Low back pain, unspecified: Secondary | ICD-10-CM | POA: Diagnosis not present

## 2020-09-05 DIAGNOSIS — E538 Deficiency of other specified B group vitamins: Secondary | ICD-10-CM | POA: Diagnosis not present

## 2020-09-11 DIAGNOSIS — M5136 Other intervertebral disc degeneration, lumbar region: Secondary | ICD-10-CM | POA: Diagnosis not present

## 2020-09-11 DIAGNOSIS — M48062 Spinal stenosis, lumbar region with neurogenic claudication: Secondary | ICD-10-CM | POA: Diagnosis not present

## 2020-09-11 DIAGNOSIS — M4316 Spondylolisthesis, lumbar region: Secondary | ICD-10-CM | POA: Diagnosis not present

## 2020-09-11 DIAGNOSIS — M545 Low back pain, unspecified: Secondary | ICD-10-CM | POA: Diagnosis not present

## 2020-10-06 DIAGNOSIS — M5126 Other intervertebral disc displacement, lumbar region: Secondary | ICD-10-CM | POA: Diagnosis not present

## 2020-10-06 DIAGNOSIS — M48061 Spinal stenosis, lumbar region without neurogenic claudication: Secondary | ICD-10-CM | POA: Diagnosis not present

## 2020-10-06 DIAGNOSIS — M47816 Spondylosis without myelopathy or radiculopathy, lumbar region: Secondary | ICD-10-CM | POA: Diagnosis not present

## 2020-10-09 DIAGNOSIS — M5136 Other intervertebral disc degeneration, lumbar region: Secondary | ICD-10-CM | POA: Diagnosis not present

## 2020-10-09 DIAGNOSIS — M4316 Spondylolisthesis, lumbar region: Secondary | ICD-10-CM | POA: Diagnosis not present

## 2020-10-09 DIAGNOSIS — M48062 Spinal stenosis, lumbar region with neurogenic claudication: Secondary | ICD-10-CM | POA: Diagnosis not present

## 2020-10-12 DIAGNOSIS — M5431 Sciatica, right side: Secondary | ICD-10-CM | POA: Diagnosis not present

## 2020-10-12 DIAGNOSIS — M48062 Spinal stenosis, lumbar region with neurogenic claudication: Secondary | ICD-10-CM | POA: Diagnosis not present

## 2020-10-12 DIAGNOSIS — M5432 Sciatica, left side: Secondary | ICD-10-CM | POA: Diagnosis not present

## 2020-10-12 DIAGNOSIS — M4726 Other spondylosis with radiculopathy, lumbar region: Secondary | ICD-10-CM | POA: Diagnosis not present

## 2020-10-13 DIAGNOSIS — Z23 Encounter for immunization: Secondary | ICD-10-CM | POA: Diagnosis not present

## 2020-10-31 DIAGNOSIS — M4726 Other spondylosis with radiculopathy, lumbar region: Secondary | ICD-10-CM | POA: Diagnosis not present

## 2020-10-31 DIAGNOSIS — M48062 Spinal stenosis, lumbar region with neurogenic claudication: Secondary | ICD-10-CM | POA: Diagnosis not present

## 2020-10-31 DIAGNOSIS — M5431 Sciatica, right side: Secondary | ICD-10-CM | POA: Diagnosis not present

## 2020-10-31 DIAGNOSIS — M5432 Sciatica, left side: Secondary | ICD-10-CM | POA: Diagnosis not present

## 2020-11-02 DIAGNOSIS — L814 Other melanin hyperpigmentation: Secondary | ICD-10-CM | POA: Diagnosis not present

## 2020-11-02 DIAGNOSIS — D1801 Hemangioma of skin and subcutaneous tissue: Secondary | ICD-10-CM | POA: Diagnosis not present

## 2020-11-02 DIAGNOSIS — L821 Other seborrheic keratosis: Secondary | ICD-10-CM | POA: Diagnosis not present

## 2020-11-02 DIAGNOSIS — D2271 Melanocytic nevi of right lower limb, including hip: Secondary | ICD-10-CM | POA: Diagnosis not present

## 2020-11-02 DIAGNOSIS — L218 Other seborrheic dermatitis: Secondary | ICD-10-CM | POA: Diagnosis not present

## 2020-11-06 ENCOUNTER — Telehealth: Payer: Self-pay

## 2020-11-06 NOTE — Telephone Encounter (Signed)
   Primary Cardiologist: Sinclair Grooms, MD  Chart reviewed as part of pre-operative protocol coverage. Patient was contacted 11/06/2020 in reference to pre-operative risk assessment for pending surgery as outlined below.  Calvin Ramirez was last seen on 08/10/20 by Dr. Tamala Julian.  Since that day, Calvin Ramirez has done well.  He is primarily limited by back pain, but is able to complete 4.0 METS (walking up stairs, push mowing the yard) without angina.   Therefore, based on ACC/AHA guidelines, the patient would be at acceptable risk for the planned procedure without further cardiovascular testing.   The patient was advised that if he develops new symptoms prior to surgery to contact our office to arrange for a follow-up visit, and he verbalized understanding.  I will route this recommendation to the requesting party via Epic fax function and remove from pre-op pool. Please call with questions.  [place], PA 11/06/2020, 2:02 PM

## 2020-11-06 NOTE — Telephone Encounter (Signed)
   [facility] Pre-operative Risk Assessment    HEARTCARE STAFF: - Please ensure there is not already an duplicate clearance open for this procedure. - Under Visit Info/Reason for Call, type in Other and utilize the format Clearance MM/DD/YY or Clearance TBD. Do not use dashes or single digits. - If request is for dental extraction, please clarify the # of teeth to be extracted.  Request for surgical clearance:  1. What type of surgery is being performed? Hemilaminectomy cental and lateral recess decompression  2. When is this surgery scheduled? 11/13/20   3. What type of clearance is required (medical clearance vs. Pharmacy clearance to hold med vs. Both)? Medical  4. Are there any medications that need to be held prior to surgery and how long? None   5. Practice name and name of physician performing surgery? [facility]/ Ivan Croft, MD, FAAOS   6. What is the office phone number? [phone]   7.   What is the office fax number? [phone]  8.   Anesthesia type (None, local, MAC, general) ? General   Calvin Ramirez 11/06/2020, 12:16 PM  _________________________________________________________________   (provider comments below)

## 2020-11-07 DIAGNOSIS — Z4689 Encounter for fitting and adjustment of other specified devices: Secondary | ICD-10-CM | POA: Diagnosis not present

## 2020-11-07 DIAGNOSIS — M4726 Other spondylosis with radiculopathy, lumbar region: Secondary | ICD-10-CM | POA: Diagnosis not present

## 2020-11-07 DIAGNOSIS — M5432 Sciatica, left side: Secondary | ICD-10-CM | POA: Diagnosis not present

## 2020-11-07 DIAGNOSIS — M5431 Sciatica, right side: Secondary | ICD-10-CM | POA: Diagnosis not present

## 2020-11-07 DIAGNOSIS — M48062 Spinal stenosis, lumbar region with neurogenic claudication: Secondary | ICD-10-CM | POA: Diagnosis not present

## 2020-11-10 DIAGNOSIS — M48061 Spinal stenosis, lumbar region without neurogenic claudication: Secondary | ICD-10-CM | POA: Diagnosis not present

## 2020-11-10 DIAGNOSIS — Z01812 Encounter for preprocedural laboratory examination: Secondary | ICD-10-CM | POA: Diagnosis not present

## 2020-11-10 DIAGNOSIS — Z01818 Encounter for other preprocedural examination: Secondary | ICD-10-CM | POA: Diagnosis not present

## 2020-11-10 DIAGNOSIS — Z01810 Encounter for preprocedural cardiovascular examination: Secondary | ICD-10-CM | POA: Diagnosis not present

## 2020-11-10 DIAGNOSIS — R001 Bradycardia, unspecified: Secondary | ICD-10-CM | POA: Diagnosis not present

## 2020-11-13 DIAGNOSIS — Z9889 Other specified postprocedural states: Secondary | ICD-10-CM | POA: Diagnosis not present

## 2020-11-13 DIAGNOSIS — M48062 Spinal stenosis, lumbar region with neurogenic claudication: Secondary | ICD-10-CM | POA: Diagnosis not present

## 2020-11-13 DIAGNOSIS — I252 Old myocardial infarction: Secondary | ICD-10-CM | POA: Diagnosis not present

## 2020-11-13 DIAGNOSIS — Z955 Presence of coronary angioplasty implant and graft: Secondary | ICD-10-CM | POA: Diagnosis not present

## 2020-11-13 DIAGNOSIS — I2510 Atherosclerotic heart disease of native coronary artery without angina pectoris: Secondary | ICD-10-CM | POA: Diagnosis not present

## 2020-11-13 DIAGNOSIS — M5432 Sciatica, left side: Secondary | ICD-10-CM | POA: Diagnosis not present

## 2020-11-13 DIAGNOSIS — M5431 Sciatica, right side: Secondary | ICD-10-CM | POA: Diagnosis not present

## 2020-11-13 DIAGNOSIS — Z951 Presence of aortocoronary bypass graft: Secondary | ICD-10-CM | POA: Diagnosis not present

## 2020-11-13 DIAGNOSIS — M4316 Spondylolisthesis, lumbar region: Secondary | ICD-10-CM | POA: Diagnosis not present

## 2020-11-13 DIAGNOSIS — G20 Parkinson's disease: Secondary | ICD-10-CM | POA: Diagnosis not present

## 2020-11-13 DIAGNOSIS — G8918 Other acute postprocedural pain: Secondary | ICD-10-CM | POA: Diagnosis not present

## 2020-11-13 DIAGNOSIS — M47816 Spondylosis without myelopathy or radiculopathy, lumbar region: Secondary | ICD-10-CM | POA: Diagnosis not present

## 2020-11-13 DIAGNOSIS — M4726 Other spondylosis with radiculopathy, lumbar region: Secondary | ICD-10-CM | POA: Diagnosis not present

## 2020-11-14 DIAGNOSIS — M48062 Spinal stenosis, lumbar region with neurogenic claudication: Secondary | ICD-10-CM | POA: Diagnosis not present

## 2020-11-14 DIAGNOSIS — M5432 Sciatica, left side: Secondary | ICD-10-CM | POA: Diagnosis not present

## 2020-11-14 DIAGNOSIS — I2510 Atherosclerotic heart disease of native coronary artery without angina pectoris: Secondary | ICD-10-CM | POA: Diagnosis not present

## 2020-11-14 DIAGNOSIS — M47816 Spondylosis without myelopathy or radiculopathy, lumbar region: Secondary | ICD-10-CM | POA: Diagnosis not present

## 2020-11-14 DIAGNOSIS — Z951 Presence of aortocoronary bypass graft: Secondary | ICD-10-CM | POA: Diagnosis not present

## 2020-11-14 DIAGNOSIS — Z9889 Other specified postprocedural states: Secondary | ICD-10-CM | POA: Diagnosis not present

## 2020-11-14 DIAGNOSIS — K560 Paralytic ileus: Secondary | ICD-10-CM | POA: Diagnosis not present

## 2020-11-14 DIAGNOSIS — M5431 Sciatica, right side: Secondary | ICD-10-CM | POA: Diagnosis not present

## 2020-11-14 DIAGNOSIS — M5136 Other intervertebral disc degeneration, lumbar region: Secondary | ICD-10-CM | POA: Diagnosis not present

## 2020-11-14 DIAGNOSIS — M4726 Other spondylosis with radiculopathy, lumbar region: Secondary | ICD-10-CM | POA: Diagnosis not present

## 2020-11-14 DIAGNOSIS — K6389 Other specified diseases of intestine: Secondary | ICD-10-CM | POA: Diagnosis not present

## 2020-11-15 DIAGNOSIS — Z951 Presence of aortocoronary bypass graft: Secondary | ICD-10-CM | POA: Diagnosis not present

## 2020-11-15 DIAGNOSIS — M48062 Spinal stenosis, lumbar region with neurogenic claudication: Secondary | ICD-10-CM | POA: Diagnosis not present

## 2020-11-15 DIAGNOSIS — M4726 Other spondylosis with radiculopathy, lumbar region: Secondary | ICD-10-CM | POA: Diagnosis not present

## 2020-11-15 DIAGNOSIS — M5431 Sciatica, right side: Secondary | ICD-10-CM | POA: Diagnosis not present

## 2020-11-15 DIAGNOSIS — M5432 Sciatica, left side: Secondary | ICD-10-CM | POA: Diagnosis not present

## 2020-11-15 DIAGNOSIS — I2510 Atherosclerotic heart disease of native coronary artery without angina pectoris: Secondary | ICD-10-CM | POA: Diagnosis not present

## 2020-11-16 DIAGNOSIS — M5431 Sciatica, right side: Secondary | ICD-10-CM | POA: Diagnosis not present

## 2020-11-16 DIAGNOSIS — M5432 Sciatica, left side: Secondary | ICD-10-CM | POA: Diagnosis not present

## 2020-11-16 DIAGNOSIS — Z951 Presence of aortocoronary bypass graft: Secondary | ICD-10-CM | POA: Diagnosis not present

## 2020-11-16 DIAGNOSIS — M48062 Spinal stenosis, lumbar region with neurogenic claudication: Secondary | ICD-10-CM | POA: Diagnosis not present

## 2020-11-16 DIAGNOSIS — M4726 Other spondylosis with radiculopathy, lumbar region: Secondary | ICD-10-CM | POA: Diagnosis not present

## 2020-11-16 DIAGNOSIS — I2510 Atherosclerotic heart disease of native coronary artery without angina pectoris: Secondary | ICD-10-CM | POA: Diagnosis not present

## 2020-11-17 DIAGNOSIS — Z951 Presence of aortocoronary bypass graft: Secondary | ICD-10-CM | POA: Diagnosis not present

## 2020-11-17 DIAGNOSIS — I2510 Atherosclerotic heart disease of native coronary artery without angina pectoris: Secondary | ICD-10-CM | POA: Diagnosis not present

## 2020-11-17 DIAGNOSIS — M48062 Spinal stenosis, lumbar region with neurogenic claudication: Secondary | ICD-10-CM | POA: Diagnosis not present

## 2020-11-17 DIAGNOSIS — M5432 Sciatica, left side: Secondary | ICD-10-CM | POA: Diagnosis not present

## 2020-11-17 DIAGNOSIS — M4726 Other spondylosis with radiculopathy, lumbar region: Secondary | ICD-10-CM | POA: Diagnosis not present

## 2020-11-17 DIAGNOSIS — M5431 Sciatica, right side: Secondary | ICD-10-CM | POA: Diagnosis not present

## 2020-12-04 DIAGNOSIS — R443 Hallucinations, unspecified: Secondary | ICD-10-CM | POA: Diagnosis not present

## 2020-12-04 DIAGNOSIS — R413 Other amnesia: Secondary | ICD-10-CM | POA: Diagnosis not present

## 2020-12-04 DIAGNOSIS — F513 Sleepwalking [somnambulism]: Secondary | ICD-10-CM | POA: Diagnosis not present

## 2020-12-05 ENCOUNTER — Other Ambulatory Visit: Payer: Self-pay | Admitting: Family Medicine

## 2020-12-05 DIAGNOSIS — R413 Other amnesia: Secondary | ICD-10-CM

## 2020-12-10 ENCOUNTER — Inpatient Hospital Stay: Admission: RE | Admit: 2020-12-10 | Payer: Medicare Other | Source: Ambulatory Visit

## 2020-12-12 ENCOUNTER — Other Ambulatory Visit: Payer: Self-pay

## 2020-12-12 ENCOUNTER — Ambulatory Visit
Admission: RE | Admit: 2020-12-12 | Discharge: 2020-12-12 | Disposition: A | Discharge status: Home / Self Care | Payer: Medicare Other | Source: Ambulatory Visit | Attending: Family Medicine | Admitting: Family Medicine

## 2020-12-12 DIAGNOSIS — I6782 Cerebral ischemia: Secondary | ICD-10-CM | POA: Diagnosis not present

## 2020-12-12 DIAGNOSIS — R410 Disorientation, unspecified: Secondary | ICD-10-CM | POA: Diagnosis not present

## 2020-12-12 DIAGNOSIS — R413 Other amnesia: Secondary | ICD-10-CM

## 2020-12-12 DIAGNOSIS — G9389 Other specified disorders of brain: Secondary | ICD-10-CM | POA: Diagnosis not present

## 2020-12-12 DIAGNOSIS — J3489 Other specified disorders of nose and nasal sinuses: Secondary | ICD-10-CM | POA: Diagnosis not present

## 2020-12-14 DIAGNOSIS — R413 Other amnesia: Secondary | ICD-10-CM | POA: Diagnosis not present

## 2020-12-14 DIAGNOSIS — F5101 Primary insomnia: Secondary | ICD-10-CM | POA: Diagnosis not present

## 2020-12-19 ENCOUNTER — Other Ambulatory Visit: Payer: Self-pay | Admitting: Interventional Cardiology

## 2020-12-21 DIAGNOSIS — F5101 Primary insomnia: Secondary | ICD-10-CM | POA: Diagnosis not present

## 2020-12-21 DIAGNOSIS — I10 Essential (primary) hypertension: Secondary | ICD-10-CM | POA: Diagnosis not present

## 2020-12-21 DIAGNOSIS — R413 Other amnesia: Secondary | ICD-10-CM | POA: Diagnosis not present

## 2020-12-21 DIAGNOSIS — G20 Parkinson's disease: Secondary | ICD-10-CM | POA: Diagnosis not present

## 2020-12-21 DIAGNOSIS — I2510 Atherosclerotic heart disease of native coronary artery without angina pectoris: Secondary | ICD-10-CM | POA: Diagnosis not present

## 2020-12-21 DIAGNOSIS — E7800 Pure hypercholesterolemia, unspecified: Secondary | ICD-10-CM | POA: Diagnosis not present

## 2020-12-28 ENCOUNTER — Encounter: Payer: Self-pay | Admitting: Neurology

## 2020-12-28 ENCOUNTER — Ambulatory Visit (INDEPENDENT_AMBULATORY_CARE_PROVIDER_SITE_OTHER): Payer: Medicare Other | Admitting: Neurology

## 2020-12-28 VITALS — BP 113/68 | HR 66 | Ht 70.0 in | Wt 193.2 lb

## 2020-12-28 DIAGNOSIS — F0391 Unspecified dementia with behavioral disturbance: Secondary | ICD-10-CM

## 2020-12-28 DIAGNOSIS — M48062 Spinal stenosis, lumbar region with neurogenic claudication: Secondary | ICD-10-CM | POA: Diagnosis not present

## 2020-12-28 MED ORDER — DONEPEZIL HCL 5 MG PO TABS: 5.0000 mg | ORAL_TABLET | Freq: Every day | ORAL | 1 refills | Status: DC

## 2020-12-28 NOTE — Patient Instructions (Signed)
We will start aricept at night for the memory issues.  Begin Aricept (donepezil) at 5 mg at night for one month. If this medication is well-tolerated, please call our office and we will call in a prescription for the 10 mg tablets. Look out for side effects that may include nausea, diarrhea, weight loss, or stomach cramps. This medication will also cause a runny nose, therefore there is no need for allergy medications for this purpose.

## 2020-12-28 NOTE — Progress Notes (Signed)
Reason for visit: Memory disturbance  Referring physician: Dr. Ivar Drape is a 80 y.o. male  History of present illness:  Calvin Ramirez is an 80 year old right-handed white male with a history of progressive memory disturbance since 2010.  The patient retired at that time in part related to memory, he was having difficulty remembering names.  This issue has gradually progressed over the years.  The patient has been followed for possible Parkinson's disease since 2017, he was given a prescription for Sinemet but he never took it.  He has had some tremors involving the left greater than right upper extremities that has been mainly an intention tremor for greater than 30 years.  The patient was seen in December 2020 for second opinion regarding his Parkinson's disease by Dr. Nyoka Cowden, it was felt that the he did not meet the criteria for Parkinson's disease at that time.  He has never been on any medications for this issue.  The patient continues to have significant problems with remembering names for people and places, he has not been operating a motor vehicle for 6 or 7 months as he was getting lost.  His wife has had to take over managing his medications and appointments for greater than 1 year.  The patient has not been able to manage the finances over the last 2 months.  The patient had lumbosacral spine surgery done on 13 November 2020, following surgery he had hallucinations coming home from the hospital but this has resolved.  He has had a significant increase in confusion that has continued, he was not recognizing his own wife.  He underwent open MRI of the brain recently that showed significant generalized cortical atrophy, no acute changes were seen.  No evidence of significant white matter changes were noted.  The wife indicates that there is some problems with drooling and a low amplitude voice, the patient has been engaging in [employer] on a regular basis.  He was  given a prescription for Aricept but never took the medication.  He returns to the office today for further evaluation.  Past Medical History:  Diagnosis Date  . Acute diastolic (congestive) heart failure ([place]) 02/13/2018  . Arthritis   . 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 CAD (coronary artery disease) 02/24/2018  . CAD in native artery 04/26/2015   2011 Multivessel DES PCI LAD,RCA PLOM and distal RCA   . Coronary artery disease   . Dementia ([place])   . Essential hypertension 04/21/2014  . Hyperlipidemia   . Parkinson's disease ([place]) 04/27/2015   2016   . Paroxysmal atrial fibrillation ([place]) 03/17/2018  . 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 Stented coronary artery 2011   x3    Past Surgical History:  Procedure Laterality Date  . APPENDECTOMY    . BACK SURGERY  11/13/21   Laminectomy  . CARDIAC CATHETERIZATION  2011   3 stents  . COLONOSCOPY    . CORONARY ARTERY BYPASS GRAFT N/A 02/25/2018   Procedure: CORONARY ARTERY BYPASS GRAFTING (CABG), ON PUMP, TIMES TWO, USING LEFT INTERNAL MAMMARY ARTERY AND ENDOSCOPICALLY HARVESTED RIGHT GREATER SAPHENOUS VEIN;  Surgeon: Rexene Alberts, MD;  Location: [place];  Service: Open Heart Surgery;  Laterality: N/A;  . RIGHT/LEFT HEART CATH AND CORONARY ANGIOGRAPHY N/A 02/24/2018   Procedure: RIGHT/LEFT HEART CATH AND CORONARY ANGIOGRAPHY;  Surgeon: Belva Crome, MD;  Location: [facility] INVASIVE CV  LAB;  Service: Cardiovascular;  Laterality: N/A;  . TEE WITHOUT CARDIOVERSION N/A 02/25/2018   Procedure: TRANSESOPHAGEAL ECHOCARDIOGRAM (TEE);  Surgeon: Rexene Alberts, MD;  Location: [place];  Service: Open Heart Surgery;  Laterality: N/A;  . TONSILLECTOMY      Family History  Problem Relation Age of Onset  . Heart attack Mother   . Cancer Father     Social history:  reports that he has never smoked. He has never used smokeless tobacco. He reports current alcohol use. He reports that he does not use  drugs.  Medications:  Prior to Admission medications   Medication Sig Start Date End Date Taking? Authorizing Provider  aspirin EC 81 MG tablet Take 1 tablet (81 mg total) by mouth daily. 03/04/19  Yes Belva Crome, MD  Cyanocobalamin (VITAMIN B-12 PO) Take 1,000 mg by mouth daily.   Yes [provider]  furosemide (LASIX) 40 MG tablet Take 1 tablet (40 mg total) by mouth daily. 01/26/20  Yes Belva Crome, MD  Melatonin 5 MG CAPS Take 1 capsule by mouth as needed (sleep).   Yes [provider]  metoprolol tartrate (LOPRESSOR) 25 MG tablet Take 0.5 tablets (12.5 mg total) by mouth 2 (two) times daily. 03/14/20  Yes Belva Crome, MD  rosuvastatin (CRESTOR) 20 MG tablet TAKE 1 TABLET(20 MG) BY MOUTH DAILY 12/19/20  Yes Belva Crome, MD  spironolactone (ALDACTONE) 25 MG tablet Take 0.5 tablets (12.5 mg total) by mouth daily. 01/26/20  Yes Belva Crome, MD  traZODone (DESYREL) 100 MG tablet Take 100 mg by mouth at bedtime.   Yes [provider]  Turmeric (QC TUMERIC COMPLEX PO) Take 1 tablet by mouth daily.   Yes [provider]  VITAMIN D PO Take 1,000 mcg by mouth daily.   Yes [provider]  donepezil (ARICEPT) 5 MG tablet Take 1 tablet by mouth daily. 08/04/19   [provider]  losartan (COZAAR) 50 MG tablet Take 1 tablet (50 mg total) by mouth daily. 04/21/18 01/26/20  Belva Crome, MD  nitroGLYCERIN (NITROSTAT) 0.4 MG SL tablet Place 1 tablet (0.4 mg total) under the tongue every 5 (five) minutes as needed for chest pain. 10/14/19 01/26/20  Belva Crome, MD      Allergies  Allergen Reactions  . Levaquin [Levofloxacin Hemihydrate] Itching and Rash  . Penicillins Itching, Rash and Other (See Comments)    Has patient had a PCN reaction causing immediate rash, facial/tongue/throat swelling, SOB or lightheadedness with hypotension: Unknown Has patient had a PCN reaction causing severe rash involving mucus membranes or skin necrosis: No Has  patient had a PCN reaction that required hospitalization: No Has patient had a PCN reaction occurring within the last 10 years: No If all of the above answers are "NO", then may proceed with Cephalosporin use.     ROS:  Out of a complete 14 system review of symptoms, the patient complains only of the following symptoms, and all other reviewed systems are negative.  Memory problems confusion Tremor  Blood pressure 113/68, pulse 66, height 5\' 10"  (1.778 m), weight 193 lb 3.2 oz (87.6 kg).  Physical Exam  General: The patient is alert and cooperative at the time of the examination.  Eyes: Pupils are equal, round, and reactive to light. Discs are flat bilaterally.  Neck: The neck is supple, no carotid bruits are noted.  Respiratory: The respiratory examination is clear.  Cardiovascular: The cardiovascular examination reveals a regular rate and  rhythm, no obvious murmurs or rubs are noted.  Skin: Extremities are without significant edema.  Neurologic Exam  Mental status: The patient is alert and oriented x 3 at the time of the examination. The Mini-Mental status examination done today shows a total score 20/30.  Cranial nerves: Facial symmetry is present. There is good sensation of the face to pinprick and soft touch bilaterally. The strength of the facial muscles and the muscles to head turning and shoulder shrug are normal bilaterally. Speech is well enunciated, no aphasia or dysarthria is noted. Extraocular movements are full.  With superior gaze there is divergence with exotropia of the right eye.  Visual fields are full. The tongue is midline, and the patient has symmetric elevation of the soft palate. No obvious hearing deficits are noted.  Motor: The motor testing reveals 5 over 5 strength of all 4 extremities. Good symmetric motor tone is noted throughout.  Sensory: Sensory testing is intact to pinprick, soft touch, vibration sensation, and position sense on all 4 extremities.  No evidence of extinction is noted.  Coordination: Cerebellar testing reveals good finger-nose-finger and heel-to-shin bilaterally.  There is a mild action tremor with finger-nose-finger bilaterally.  No resting tremor is seen.  Gait and station: The patient is able to rise from a seated position without difficulty.  Once up, he is able to ambulate without difficulty with good stride and good turns.  He has a slightly stooped posture.  Good arm swing is seen bilaterally.  Tandem gait is slightly unsteady.  Romberg is negative.  Reflexes: Deep tendon reflexes are symmetric and normal bilaterally. Toes are downgoing bilaterally.   MRI brain 12/12/20:  IMPRESSION: Moderate generalized parenchymal volume loss likely slightly increased since 2019. minor chronic microvascular ischemic changes.  No evidence of recent infarction, hemorrhage, or mass.  * MRI scan images were reviewed online. I agree with the written report.    Assessment/Plan:  1.  Progressive memory disturbance, probable Alzheimer's disease  The patient does not meet criteria for Parkinson disease, I would agree with the prior assessment.  The patient however does have a progressive memory disorder that is quite significant at this point.  He has worsened after general anesthesia but clearly was having significant cognitive issues prior to this.  The patient be placed on Aricept, he will follow up here in 6 months.  He has had thyroid studies and vitamin B12 levels that were normal recently.   Jill Alexanders MD 12/28/2020 2:33 PM  [facility] [address] [place], [place] [address]  Phone [phone] Fax [phone]

## 2021-02-12 ENCOUNTER — Other Ambulatory Visit: Payer: Self-pay | Admitting: Neurology

## 2021-02-26 DIAGNOSIS — H524 Presbyopia: Secondary | ICD-10-CM | POA: Diagnosis not present

## 2021-02-26 DIAGNOSIS — H43393 Other vitreous opacities, bilateral: Secondary | ICD-10-CM | POA: Diagnosis not present

## 2021-02-26 DIAGNOSIS — H0220C Unspecified lagophthalmos, bilateral, upper and lower eyelids: Secondary | ICD-10-CM | POA: Diagnosis not present

## 2021-02-26 DIAGNOSIS — H52203 Unspecified astigmatism, bilateral: Secondary | ICD-10-CM | POA: Diagnosis not present

## 2021-02-26 DIAGNOSIS — H43813 Vitreous degeneration, bilateral: Secondary | ICD-10-CM | POA: Diagnosis not present

## 2021-02-26 DIAGNOSIS — Z961 Presence of intraocular lens: Secondary | ICD-10-CM | POA: Diagnosis not present

## 2021-02-26 DIAGNOSIS — H26493 Other secondary cataract, bilateral: Secondary | ICD-10-CM | POA: Diagnosis not present

## 2021-02-26 DIAGNOSIS — H5203 Hypermetropia, bilateral: Secondary | ICD-10-CM | POA: Diagnosis not present

## 2021-02-27 ENCOUNTER — Ambulatory Visit
Admission: RE | Admit: 2021-02-27 | Discharge: 2021-02-27 | Disposition: A | Discharge status: Home / Self Care | Payer: Medicare Other | Source: Ambulatory Visit | Attending: Family Medicine | Admitting: Family Medicine

## 2021-02-27 ENCOUNTER — Other Ambulatory Visit: Payer: Self-pay | Admitting: Family Medicine

## 2021-02-27 ENCOUNTER — Other Ambulatory Visit: Payer: Self-pay

## 2021-02-27 DIAGNOSIS — I10 Essential (primary) hypertension: Secondary | ICD-10-CM | POA: Diagnosis not present

## 2021-02-27 DIAGNOSIS — I2510 Atherosclerotic heart disease of native coronary artery without angina pectoris: Secondary | ICD-10-CM | POA: Diagnosis not present

## 2021-02-27 DIAGNOSIS — G301 Alzheimer's disease with late onset: Secondary | ICD-10-CM | POA: Diagnosis not present

## 2021-02-27 DIAGNOSIS — Z0000 Encounter for general adult medical examination without abnormal findings: Secondary | ICD-10-CM | POA: Diagnosis not present

## 2021-02-27 DIAGNOSIS — M25531 Pain in right wrist: Secondary | ICD-10-CM | POA: Diagnosis not present

## 2021-02-27 DIAGNOSIS — F0281 Dementia in other diseases classified elsewhere with behavioral disturbance: Secondary | ICD-10-CM | POA: Diagnosis not present

## 2021-02-27 DIAGNOSIS — E7800 Pure hypercholesterolemia, unspecified: Secondary | ICD-10-CM | POA: Diagnosis not present

## 2021-02-27 DIAGNOSIS — M159 Polyosteoarthritis, unspecified: Secondary | ICD-10-CM | POA: Diagnosis not present

## 2021-03-01 DIAGNOSIS — M5432 Sciatica, left side: Secondary | ICD-10-CM | POA: Diagnosis not present

## 2021-03-01 DIAGNOSIS — M4726 Other spondylosis with radiculopathy, lumbar region: Secondary | ICD-10-CM | POA: Diagnosis not present

## 2021-03-01 DIAGNOSIS — M4316 Spondylolisthesis, lumbar region: Secondary | ICD-10-CM | POA: Diagnosis not present

## 2021-03-01 DIAGNOSIS — M48062 Spinal stenosis, lumbar region with neurogenic claudication: Secondary | ICD-10-CM | POA: Diagnosis not present

## 2021-03-21 DIAGNOSIS — M654 Radial styloid tenosynovitis [de Quervain]: Secondary | ICD-10-CM | POA: Diagnosis not present

## 2021-04-02 ENCOUNTER — Other Ambulatory Visit: Payer: Self-pay | Admitting: Interventional Cardiology

## 2021-04-10 ENCOUNTER — Other Ambulatory Visit: Payer: Self-pay | Admitting: Neurology

## 2021-05-05 ENCOUNTER — Other Ambulatory Visit: Payer: Self-pay | Admitting: Interventional Cardiology

## 2021-05-11 ENCOUNTER — Other Ambulatory Visit: Payer: Self-pay | Admitting: *Deleted

## 2021-05-11 MED ORDER — METOPROLOL TARTRATE 25 MG PO TABS: 12.5000 mg | ORAL_TABLET | Freq: Two times a day (BID) | ORAL | 3 refills | Status: DC

## 2021-05-22 ENCOUNTER — Other Ambulatory Visit: Payer: Self-pay

## 2021-05-22 MED ORDER — METOPROLOL TARTRATE 25 MG PO TABS: 12.5000 mg | ORAL_TABLET | Freq: Two times a day (BID) | ORAL | 0 refills | Status: DC

## 2021-07-02 ENCOUNTER — Ambulatory Visit: Payer: Medicare Other | Admitting: Neurology

## 2021-07-09 ENCOUNTER — Other Ambulatory Visit: Payer: Self-pay | Admitting: Neurology

## 2021-07-20 DIAGNOSIS — Z23 Encounter for immunization: Secondary | ICD-10-CM | POA: Diagnosis not present

## 2021-07-23 ENCOUNTER — Ambulatory Visit: Payer: Medicare Other | Admitting: Neurology

## 2021-08-05 DIAGNOSIS — R519 Headache, unspecified: Secondary | ICD-10-CM | POA: Diagnosis not present

## 2021-08-05 DIAGNOSIS — Z03818 Encounter for observation for suspected exposure to other biological agents ruled out: Secondary | ICD-10-CM | POA: Diagnosis not present

## 2021-08-05 DIAGNOSIS — R059 Cough, unspecified: Secondary | ICD-10-CM | POA: Diagnosis not present

## 2021-08-20 ENCOUNTER — Other Ambulatory Visit: Payer: Self-pay | Admitting: Family Medicine

## 2021-08-20 ENCOUNTER — Ambulatory Visit
Admission: RE | Admit: 2021-08-20 | Discharge: 2021-08-20 | Disposition: A | Discharge status: Home / Self Care | Payer: Medicare Other | Source: Ambulatory Visit | Attending: Family Medicine | Admitting: Family Medicine

## 2021-08-20 DIAGNOSIS — M25532 Pain in left wrist: Secondary | ICD-10-CM | POA: Diagnosis not present

## 2021-08-20 DIAGNOSIS — R3989 Other symptoms and signs involving the genitourinary system: Secondary | ICD-10-CM | POA: Diagnosis not present

## 2021-08-20 DIAGNOSIS — I10 Essential (primary) hypertension: Secondary | ICD-10-CM | POA: Diagnosis not present

## 2021-09-04 DIAGNOSIS — R413 Other amnesia: Secondary | ICD-10-CM | POA: Diagnosis not present

## 2021-09-04 DIAGNOSIS — Z23 Encounter for immunization: Secondary | ICD-10-CM | POA: Diagnosis not present

## 2021-09-04 DIAGNOSIS — E7800 Pure hypercholesterolemia, unspecified: Secondary | ICD-10-CM | POA: Diagnosis not present

## 2021-09-04 DIAGNOSIS — I10 Essential (primary) hypertension: Secondary | ICD-10-CM | POA: Diagnosis not present

## 2021-09-04 DIAGNOSIS — G20 Parkinson's disease: Secondary | ICD-10-CM | POA: Diagnosis not present

## 2021-09-04 DIAGNOSIS — F5101 Primary insomnia: Secondary | ICD-10-CM | POA: Diagnosis not present

## 2021-09-21 ENCOUNTER — Other Ambulatory Visit: Payer: Self-pay | Admitting: Interventional Cardiology

## 2021-10-02 NOTE — Progress Notes (Signed)
Cardiology Office Note:    Date:  10/03/2021   ID:  Calvin Ramirez, DOB [birth year], MRN [id number]  PCP:  Calvin Frees, MD  Cardiologist:  Calvin Grooms, MD   Referring MD: Calvin Frees, MD   Chief Complaint  Patient presents with   Coronary Artery Disease   Follow-up    Bradycardia Memory Hyperlipidemia     History of Present Illness:    Calvin Ramirez is a 80 y.o. male with a hx of CAD, recent CABG with LIMA to LAD and SVG to OM for left main disease (April 2018), hyperlipidemia, Parkinson's disease, postop atrial fibrillation, essential hypertension, and diastolic heart failure in the setting of acute ischemia caused by left main disease. Recent acute on chronic combined systolic and diastolic HF treated with diuretics.    Is accompanied by his wife.  He has no complaints.  Has not had syncope.  He denies angina.  He walks every day.  He has decreased memory.  His wife is keeping him from driving.  He made a mistake and took his p.m. medicines after having his a.m. medicines 1 day and became very dizzy.  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   LIMA to LAD, SVG to OM2, EVH via right thigh   Stented coronary artery 2011   x3    Past Surgical History:  Procedure Laterality Date   APPENDECTOMY     BACK SURGERY  11/13/21   Laminectomy   CARDIAC CATHETERIZATION  2011   3 stents   COLONOSCOPY     CORONARY ARTERY BYPASS GRAFT N/A 02/25/2018   Procedure: CORONARY ARTERY BYPASS GRAFTING  (CABG), ON PUMP, TIMES TWO, USING LEFT INTERNAL MAMMARY ARTERY AND ENDOSCOPICALLY HARVESTED RIGHT GREATER SAPHENOUS VEIN;  Surgeon: Calvin Alberts, MD;  Location: [place];  Service: Open Heart Surgery;  Laterality: N/A;   RIGHT/LEFT HEART CATH AND CORONARY ANGIOGRAPHY N/A 02/24/2018   Procedure: RIGHT/LEFT HEART CATH AND CORONARY ANGIOGRAPHY;  Surgeon: Calvin Crome, MD;  Location: [place] CV LAB;  Service: Cardiovascular;  Laterality: N/A;   TEE WITHOUT CARDIOVERSION N/A 02/25/2018   Procedure: TRANSESOPHAGEAL ECHOCARDIOGRAM (TEE);  Surgeon: Calvin Alberts, MD;  Location: [place];  Service: Open Heart Surgery;  Laterality: N/A;   TONSILLECTOMY      Current Medications: Current Meds  Medication Sig   Cyanocobalamin (VITAMIN B-12 PO) Take 1,000 mg by mouth daily.   donepezil (ARICEPT) 5 MG tablet TAKE ONE TABLET BY MOUTH DAILY AT BEDTIME (Patient taking differently: 10 mg.)   furosemide (LASIX) 40 MG tablet TAKE ONE TABLET BY MOUTH ONE TIME DAILY. PLEASE CALL AND SCHEDULE A ONE YEAR APPOINTMENT   losartan (COZAAR) 50 MG tablet Take 1 tablet (50 mg total) by mouth daily.   Melatonin 5 MG CAPS Take 1 capsule by mouth as needed (sleep).   nitroGLYCERIN (NITROSTAT) 0.4 MG SL tablet Place 1 tablet (0.4 mg total) under the tongue every 5 (five) minutes as needed for  chest pain.   rosuvastatin (CRESTOR) 20 MG tablet TAKE 1 TABLET(20 MG) BY MOUTH DAILY   spironolactone (ALDACTONE) 25 MG tablet TAKE HALF TABLET BY MOUTH DAILY   traZODone (DESYREL) 100 MG tablet Take 100 mg by mouth at bedtime.   Turmeric (QC TUMERIC COMPLEX PO) Take 1 tablet by mouth daily.   [DISCONTINUED] metoprolol tartrate (LOPRESSOR) 25 MG tablet Take 0.5 tablets (12.5 mg total) by mouth 2 (two) times daily. Please make yearly appt with Dr. Tamala Julian for September 2022 for future refills. Thank you 1st attempt     Allergies:   Levaquin [levofloxacin hemihydrate] and Penicillins   Social History   Socioeconomic History   Marital  status: Married    Spouse name: sally   Number of children: Not on file   Years of education: Not on file   Highest education level: Not on file  Occupational History   Not on file  Tobacco Use   Smoking status: Never   Smokeless tobacco: Never  Vaping Use   Vaping Use: Never used  Substance and Sexual Activity   Alcohol use: Yes    Alcohol/week: 0.0 standard drinks    Comment: occ   Drug use: No   Sexual activity: Not on file  Other Topics Concern   Not on file  Social History Narrative   Lives with wife   Right Handed   Drinks 6-8 cups caffeine   Social Determinants of Health   Financial Resource Strain: Not on file  Food Insecurity: Not on file  Transportation Needs: Not on file  Physical Activity: Not on file  Stress: Not on file  Social Connections: Not on file     Family History: The patient's family history includes Cancer in his father; Heart attack in his mother.  ROS:   Please see the history of present illness.    Wife is disturbed that the diagnosis of Parkinson's disease was taken away by a local neurologist.  Not driving all other systems reviewed and are negative.  EKGs/Labs/Other Studies Reviewed:    The following studies were reviewed today: No new data  EKG:  EKG Marked sinus bradycardia at 45 bpm, QS pattern V1 through V3.  PR interval 222 ms.  When compared to the prior tracing performed in November 2020, the heart rate is slower (previously 57) and PR interval is longer (previously 208 ms).  Recent Labs: No results found for requested labs within last 8760 hours.  Recent Lipid Panel    Component Value Date/Time   CHOL 147 06/04/2019 0739   TRIG 122 06/04/2019 0739   HDL 38 (L) 06/04/2019 0739   CHOLHDL 3.9 06/04/2019 0739   CHOLHDL 6.2 (H) 09/06/2016 0952   VLDL 57 (H) 09/06/2016 0952   LDLCALC 85 06/04/2019 0739   LDLDIRECT 98.0 04/21/2015 0858    Physical Exam:    VS:  BP 100/60   Pulse (!) 45   Ht 5\' 10"  (1.778 m)   Wt 186  lb (84.4 kg)   SpO2 96%   BMI 26.69 kg/m     Wt Readings from Last 3 Encounters:  10/03/21 186 lb (84.4 kg)  12/28/20 193 lb 3.2 oz (87.6 kg)  08/10/20 198 lb (89.8 kg)     GEN: Wearing a mask.  Seems anxious.. No acute distress HEENT: Normal NECK: No JVD. LYMPHATICS: No lymphadenopathy CARDIAC: No murmur. RRR no gallop, or edema. VASCULAR:  Normal Pulses. No bruits. RESPIRATORY:  Clear to auscultation without rales, wheezing or rhonchi  ABDOMEN:  Soft, non-tender, non-distended, No pulsatile mass, MUSCULOSKELETAL: No deformity  SKIN: Warm and dry NEUROLOGIC:  Alert and oriented x 3 PSYCHIATRIC:  Normal affect   ASSESSMENT:    1. Chronic diastolic CHF (congestive heart failure) ([place])   2. Coronary artery disease involving coronary bypass graft of native heart without angina pectoris   3. 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 Paroxysmal atrial fibrillation ([place])   5. Essential hypertension   6. Hyperlipidemia, unspecified hyperlipidemia type    PLAN:    In order of problems listed above:  There is no evidence of volume overload on today's exam. Secondary prevention is discussed. Currently severely bradycardic with a heart rate of 45 bpm on beta-blocker therapy.  This will be discontinued.  PR interval is also increased.  2-week monitor after washout of beta-blocker. 2-week monitor off beta-blocker to rule out PAF. Adequate blood pressure control on Aldactone 25 mg/day.  Discontinuing Lopressor will help to slightly increase the blood pressure. Rosuvastatin 20 mg/day.  Most recent lipid panel suggest that there is perhaps absence of medication compliance.  We will recheck a lipid panel in 6 to 8 weeks.   Medication Adjustments/Labs and Tests Ordered: Current medicines are reviewed at length with the patient today.  Concerns regarding medicines are outlined above.  Orders Placed This Encounter  Procedures   Lipid panel   LONG TERM MONITOR (3-14 DAYS)    No  orders of the defined types were placed in this encounter.   There are no Patient Instructions on file for this visit.   Signed, Calvin Grooms, MD  10/03/2021 12:08 PM    [place]

## 2021-10-03 ENCOUNTER — Ambulatory Visit (INDEPENDENT_AMBULATORY_CARE_PROVIDER_SITE_OTHER): Payer: Medicare Other | Admitting: Interventional Cardiology

## 2021-10-03 ENCOUNTER — Ambulatory Visit (INDEPENDENT_AMBULATORY_CARE_PROVIDER_SITE_OTHER): Payer: Medicare Other

## 2021-10-03 ENCOUNTER — Encounter: Payer: Self-pay | Admitting: Interventional Cardiology

## 2021-10-03 ENCOUNTER — Other Ambulatory Visit: Payer: Self-pay

## 2021-10-03 VITALS — BP 100/60 | HR 45 | Ht 70.0 in | Wt 186.0 lb

## 2021-10-03 DIAGNOSIS — E785 Hyperlipidemia, unspecified: Secondary | ICD-10-CM

## 2021-10-03 DIAGNOSIS — I25810 Atherosclerosis of coronary artery bypass graft(s) without angina pectoris: Secondary | ICD-10-CM | POA: Diagnosis not present

## 2021-10-03 DIAGNOSIS — I495 Sick sinus syndrome: Secondary | ICD-10-CM

## 2021-10-03 DIAGNOSIS — I480 Paroxysmal atrial fibrillation: Secondary | ICD-10-CM | POA: Diagnosis not present

## 2021-10-03 DIAGNOSIS — I10 Essential (primary) hypertension: Secondary | ICD-10-CM

## 2021-10-03 DIAGNOSIS — I5032 Chronic diastolic (congestive) heart failure: Secondary | ICD-10-CM | POA: Diagnosis not present

## 2021-10-03 NOTE — Progress Notes (Unsigned)
Patient enrolled for [employer] to mail a 14 day ZIO XT to arrive in two weeks. Patient to apply monitor 2 weeks after discontinuing Metoprolol.

## 2021-10-03 NOTE — Patient Instructions (Addendum)
Medication Instructions:  1) DISCONTINUE Metoprolol  *If you need a refill on your cardiac medications before your next appointment, please call your pharmacy*   Lab Work: Lipids in 3 months.  You will need to be fasting for these labs (nothing to eat or drink after midnight except water and black coffee).  If you have labs (blood work) drawn today and your tests are completely normal, you will receive your results only by: [place] (if you have MyChart) OR A paper copy in the mail If you have any lab test that is abnormal or we need to change your treatment, we will call you to review the results.   Testing/Procedures: Your physician recommends that you wear a monitor for 3 days.  We want you to be off of the Metoprolol for 2 weeks before starting the monitor.    Follow-Up: At [facility], you and your health needs are our priority.  As part of our continuing mission to provide you with exceptional heart care, we have created designated Provider Care Teams.  These Care Teams include your primary Cardiologist (physician) and Advanced Practice Providers (APPs -  Physician Assistants and Nurse Practitioners) who all work together to provide you with the care you need, when you need it.  We recommend signing up for the patient portal called "MyChart".  Sign up information is provided on this After Visit Summary.  MyChart is used to connect with patients for Virtual Visits (Telemedicine).  Patients are able to view lab/test results, encounter notes, upcoming appointments, etc.  Non-urgent messages can be sent to your provider as well.   To learn more about what you can do with MyChart, go to [email or web address].    Your next appointment:   1 year(s)  The format for your next appointment:   In Person  Provider:   Sinclair Grooms, MD     Other Instructions  [place] Monitor Instructions  Your physician has requested you wear a ZIO patch monitor for 3 days.   This is a single patch monitor. [employer] supplies one patch monitor per enrollment. Additional stickers are not available. Please do not apply patch if you will be having a Nuclear Stress Test,  Echocardiogram, Cardiac CT, MRI, or Chest Xray during the period you would be wearing the  monitor. The patch cannot be worn during these tests. You cannot remove and re-apply the  ZIO XT patch monitor.  Your ZIO patch monitor will be mailed 3 day USPS to your address on file. It may take 3-5 days  to receive your monitor after you have been enrolled.  Once you have received your monitor, please review the enclosed instructions. Your monitor  has already been registered assigning a specific monitor serial # to you.  Billing and Patient Assistance Program Information  We have supplied [employer] with any of your insurance information on file for billing purposes. [employer] offers a sliding scale Patient Assistance Program for patients that do not have  insurance, or whose insurance does not completely cover the cost of the ZIO monitor.  You must apply for the Patient Assistance Program to qualify for this discounted rate.  To apply, please call [employer] at [phone], select option 4, select option 2, ask to apply for  Patient Assistance Program. Theodore Demark will ask your household income, and how many people  are in your household. They will quote your out-of-pocket cost based on that information.  [employer] will also be able to set up a  72-month, interest-free payment plan if needed.  Applying the monitor   Shave hair from upper left chest.  Hold abrader disc by orange tab. Rub abrader in 40 strokes over the upper left chest as  indicated in your monitor instructions.  Clean area with 4 enclosed alcohol pads. Let dry.  Apply patch as indicated in monitor instructions. Patch will be placed under collarbone on left  side of chest with arrow pointing upward.  Rub patch adhesive wings for 2 minutes. Remove  white label marked "1". Remove the white  label marked "2". Rub patch adhesive wings for 2 additional minutes.  While looking in a mirror, press and release button in center of patch. A small green light will  flash 3-4 times. This will be your only indicator that the monitor has been turned on.  Do not shower for the first 24 hours. You may shower after the first 24 hours.  Press the button if you feel a symptom. You will hear a small click. Record Date, Time and  Symptom in the Patient Logbook.  When you are ready to remove the patch, follow instructions on the last 2 pages of Patient  Logbook. Stick patch monitor onto the last page of Patient Logbook.  Place Patient Logbook in the blue and white box. Use locking tab on box and tape box closed  securely. The blue and white box has prepaid postage on it. Please place it in the mailbox as  soon as possible. Your physician should have your test results approximately 7 days after the  monitor has been mailed back to [facility].  Call [place] at [phone] if you have questions regarding  your ZIO XT patch monitor. Call them immediately if you see an orange light blinking on your  monitor.  If your monitor falls off in less than 4 days, contact our Monitor department at [phone].  If your monitor becomes loose or falls off after 4 days call [employer] at [phone] for  suggestions on securing your monitor

## 2021-10-05 NOTE — Addendum Note (Signed)
Addended by: [place], Talyia Allende A on: 10/05/2021 02:22 PM   Modules accepted: Orders

## 2021-10-12 DIAGNOSIS — I480 Paroxysmal atrial fibrillation: Secondary | ICD-10-CM

## 2021-10-22 DIAGNOSIS — I480 Paroxysmal atrial fibrillation: Secondary | ICD-10-CM | POA: Diagnosis not present

## 2021-10-29 DIAGNOSIS — G20 Parkinson's disease: Secondary | ICD-10-CM | POA: Diagnosis not present

## 2021-10-29 DIAGNOSIS — Z6826 Body mass index (BMI) 26.0-26.9, adult: Secondary | ICD-10-CM | POA: Diagnosis not present

## 2021-11-14 DIAGNOSIS — G20 Parkinson's disease: Secondary | ICD-10-CM | POA: Diagnosis not present

## 2021-11-19 ENCOUNTER — Other Ambulatory Visit: Payer: Self-pay | Admitting: Interventional Cardiology

## 2021-11-22 DIAGNOSIS — R0989 Other specified symptoms and signs involving the circulatory and respiratory systems: Secondary | ICD-10-CM | POA: Diagnosis not present

## 2021-11-22 DIAGNOSIS — R051 Acute cough: Secondary | ICD-10-CM | POA: Diagnosis not present

## 2021-11-22 DIAGNOSIS — J3489 Other specified disorders of nose and nasal sinuses: Secondary | ICD-10-CM | POA: Diagnosis not present

## 2021-12-27 DIAGNOSIS — G20 Parkinson's disease: Secondary | ICD-10-CM | POA: Diagnosis not present

## 2021-12-27 DIAGNOSIS — R413 Other amnesia: Secondary | ICD-10-CM | POA: Diagnosis not present

## 2021-12-27 DIAGNOSIS — I2510 Atherosclerotic heart disease of native coronary artery without angina pectoris: Secondary | ICD-10-CM | POA: Diagnosis not present

## 2021-12-27 DIAGNOSIS — M545 Low back pain, unspecified: Secondary | ICD-10-CM | POA: Diagnosis not present

## 2021-12-27 DIAGNOSIS — E7800 Pure hypercholesterolemia, unspecified: Secondary | ICD-10-CM | POA: Diagnosis not present

## 2021-12-27 DIAGNOSIS — F5101 Primary insomnia: Secondary | ICD-10-CM | POA: Diagnosis not present

## 2021-12-27 DIAGNOSIS — I10 Essential (primary) hypertension: Secondary | ICD-10-CM | POA: Diagnosis not present

## 2021-12-31 ENCOUNTER — Other Ambulatory Visit: Payer: Medicare Other | Admitting: *Deleted

## 2021-12-31 ENCOUNTER — Other Ambulatory Visit: Payer: Self-pay

## 2021-12-31 DIAGNOSIS — I25810 Atherosclerosis of coronary artery bypass graft(s) without angina pectoris: Secondary | ICD-10-CM

## 2021-12-31 DIAGNOSIS — E785 Hyperlipidemia, unspecified: Secondary | ICD-10-CM | POA: Diagnosis not present

## 2021-12-31 LAB — LIPID PANEL
Chol/HDL Ratio: 3.5 ratio (ref 0.0–5.0)
Cholesterol, Total: 134 mg/dL (ref 100–199)
HDL: 38 mg/dL — ABNORMAL LOW (ref >39)
LDL Chol Calc (NIH): 68 mg/dL (ref 0–99)
Triglycerides: 167 mg/dL — ABNORMAL HIGH (ref 0–149)
VLDL Cholesterol Cal: 28 mg/dL (ref 5–40)

## 2022-01-02 ENCOUNTER — Other Ambulatory Visit: Payer: Medicare Other

## 2022-01-14 DIAGNOSIS — Z6827 Body mass index (BMI) 27.0-27.9, adult: Secondary | ICD-10-CM | POA: Diagnosis not present

## 2022-01-14 DIAGNOSIS — F02A0 Dementia in other diseases classified elsewhere, mild, without behavioral disturbance, psychotic disturbance, mood disturbance, and anxiety: Secondary | ICD-10-CM | POA: Diagnosis not present

## 2022-01-14 DIAGNOSIS — G3183 Dementia with Lewy bodies: Secondary | ICD-10-CM | POA: Diagnosis not present

## 2022-02-18 ENCOUNTER — Other Ambulatory Visit: Payer: Self-pay | Admitting: Interventional Cardiology

## 2022-02-28 DIAGNOSIS — D2262 Melanocytic nevi of left upper limb, including shoulder: Secondary | ICD-10-CM | POA: Diagnosis not present

## 2022-02-28 DIAGNOSIS — D2372 Other benign neoplasm of skin of left lower limb, including hip: Secondary | ICD-10-CM | POA: Diagnosis not present

## 2022-02-28 DIAGNOSIS — D2272 Melanocytic nevi of left lower limb, including hip: Secondary | ICD-10-CM | POA: Diagnosis not present

## 2022-02-28 DIAGNOSIS — D1801 Hemangioma of skin and subcutaneous tissue: Secondary | ICD-10-CM | POA: Diagnosis not present

## 2022-02-28 DIAGNOSIS — L821 Other seborrheic keratosis: Secondary | ICD-10-CM | POA: Diagnosis not present

## 2022-02-28 DIAGNOSIS — D225 Melanocytic nevi of trunk: Secondary | ICD-10-CM | POA: Diagnosis not present

## 2022-02-28 DIAGNOSIS — L814 Other melanin hyperpigmentation: Secondary | ICD-10-CM | POA: Diagnosis not present

## 2022-02-28 DIAGNOSIS — L570 Actinic keratosis: Secondary | ICD-10-CM | POA: Diagnosis not present

## 2022-03-12 DIAGNOSIS — H43813 Vitreous degeneration, bilateral: Secondary | ICD-10-CM | POA: Diagnosis not present

## 2022-03-12 DIAGNOSIS — H52203 Unspecified astigmatism, bilateral: Secondary | ICD-10-CM | POA: Diagnosis not present

## 2022-03-12 DIAGNOSIS — Z961 Presence of intraocular lens: Secondary | ICD-10-CM | POA: Diagnosis not present

## 2022-03-12 DIAGNOSIS — H0220C Unspecified lagophthalmos, bilateral, upper and lower eyelids: Secondary | ICD-10-CM | POA: Diagnosis not present

## 2022-03-12 DIAGNOSIS — H524 Presbyopia: Secondary | ICD-10-CM | POA: Diagnosis not present

## 2022-03-12 DIAGNOSIS — H43393 Other vitreous opacities, bilateral: Secondary | ICD-10-CM | POA: Diagnosis not present

## 2022-03-12 DIAGNOSIS — H26493 Other secondary cataract, bilateral: Secondary | ICD-10-CM | POA: Diagnosis not present

## 2022-03-12 DIAGNOSIS — H5203 Hypermetropia, bilateral: Secondary | ICD-10-CM | POA: Diagnosis not present

## 2022-03-19 DIAGNOSIS — F5101 Primary insomnia: Secondary | ICD-10-CM | POA: Diagnosis not present

## 2022-03-19 DIAGNOSIS — R413 Other amnesia: Secondary | ICD-10-CM | POA: Diagnosis not present

## 2022-03-19 DIAGNOSIS — E7800 Pure hypercholesterolemia, unspecified: Secondary | ICD-10-CM | POA: Diagnosis not present

## 2022-03-19 DIAGNOSIS — I2510 Atherosclerotic heart disease of native coronary artery without angina pectoris: Secondary | ICD-10-CM | POA: Diagnosis not present

## 2022-03-19 DIAGNOSIS — I10 Essential (primary) hypertension: Secondary | ICD-10-CM | POA: Diagnosis not present

## 2022-03-19 DIAGNOSIS — G20 Parkinson's disease: Secondary | ICD-10-CM | POA: Diagnosis not present

## 2022-03-19 DIAGNOSIS — Z0000 Encounter for general adult medical examination without abnormal findings: Secondary | ICD-10-CM | POA: Diagnosis not present

## 2022-03-20 ENCOUNTER — Other Ambulatory Visit: Payer: Self-pay | Admitting: Interventional Cardiology

## 2022-03-20 ENCOUNTER — Other Ambulatory Visit: Payer: Self-pay | Admitting: Physician Assistant

## 2022-03-21 MED ORDER — FUROSEMIDE 40 MG PO TABS: ORAL_TABLET | ORAL | 2 refills | Status: AC

## 2022-04-15 DIAGNOSIS — F02A0 Dementia in other diseases classified elsewhere, mild, without behavioral disturbance, psychotic disturbance, mood disturbance, and anxiety: Secondary | ICD-10-CM | POA: Diagnosis not present

## 2022-04-15 DIAGNOSIS — G3183 Dementia with Lewy bodies: Secondary | ICD-10-CM | POA: Diagnosis not present

## 2022-05-06 ENCOUNTER — Emergency Department (HOSPITAL_COMMUNITY): Payer: Medicare Other

## 2022-05-06 ENCOUNTER — Other Ambulatory Visit: Payer: Self-pay

## 2022-05-06 ENCOUNTER — Emergency Department (HOSPITAL_COMMUNITY)
Admission: EM | Admit: 2022-05-06 | Discharge: 2022-05-06 | Disposition: A | Discharge status: Home / Self Care | Payer: Medicare Other | Attending: Emergency Medicine | Admitting: Emergency Medicine

## 2022-05-06 DIAGNOSIS — I10 Essential (primary) hypertension: Secondary | ICD-10-CM | POA: Diagnosis not present

## 2022-05-06 DIAGNOSIS — N179 Acute kidney failure, unspecified: Secondary | ICD-10-CM | POA: Insufficient documentation

## 2022-05-06 DIAGNOSIS — G3183 Dementia with Lewy bodies: Secondary | ICD-10-CM | POA: Insufficient documentation

## 2022-05-06 DIAGNOSIS — E860 Dehydration: Secondary | ICD-10-CM | POA: Diagnosis not present

## 2022-05-06 DIAGNOSIS — R4781 Slurred speech: Secondary | ICD-10-CM | POA: Insufficient documentation

## 2022-05-06 DIAGNOSIS — R4182 Altered mental status, unspecified: Secondary | ICD-10-CM | POA: Diagnosis not present

## 2022-05-06 DIAGNOSIS — R531 Weakness: Secondary | ICD-10-CM | POA: Diagnosis not present

## 2022-05-06 DIAGNOSIS — G319 Degenerative disease of nervous system, unspecified: Secondary | ICD-10-CM | POA: Diagnosis not present

## 2022-05-06 LAB — CBC WITH DIFFERENTIAL/PLATELET
Abs Immature Granulocytes: 0.01 10*3/uL (ref 0.00–0.07)
Basophils Absolute: 0.1 10*3/uL (ref 0.0–0.1)
Basophils Relative: 1 %
Eosinophils Absolute: 0.1 10*3/uL (ref 0.0–0.5)
Eosinophils Relative: 1 %
HCT: 39.4 % (ref 39.0–52.0)
Hemoglobin: 12.5 g/dL — ABNORMAL LOW (ref 13.0–17.0)
Immature Granulocytes: 0 %
Lymphocytes Relative: 19 %
Lymphs Abs: 1.2 10*3/uL (ref 0.7–4.0)
MCH: 31.6 pg (ref 26.0–34.0)
MCHC: 31.7 g/dL (ref 30.0–36.0)
MCV: 99.5 fL (ref 80.0–100.0)
Monocytes Absolute: 0.6 10*3/uL (ref 0.1–1.0)
Monocytes Relative: 9 %
Neutro Abs: 4.6 10*3/uL (ref 1.7–7.7)
Neutrophils Relative %: 70 %
Platelets: 204 10*3/uL (ref 150–400)
RBC: 3.96 MIL/uL — ABNORMAL LOW (ref 4.22–5.81)
RDW: 14.6 % (ref 11.5–15.5)
WBC: 6.6 10*3/uL (ref 4.0–10.5)
nRBC: 0.3 % — ABNORMAL HIGH (ref 0.0–0.2)

## 2022-05-06 LAB — URINALYSIS, ROUTINE W REFLEX MICROSCOPIC
Bilirubin Urine: NEGATIVE
Glucose, UA: NEGATIVE mg/dL
Hgb urine dipstick: NEGATIVE
Ketones, ur: NEGATIVE mg/dL
Leukocytes,Ua: NEGATIVE
Nitrite: NEGATIVE
Protein, ur: NEGATIVE mg/dL
Specific Gravity, Urine: 1.015 (ref 1.005–1.030)
pH: 5 (ref 5.0–8.0)

## 2022-05-06 LAB — COMPREHENSIVE METABOLIC PANEL
ALT: 35 U/L (ref 0–44)
AST: 30 U/L (ref 15–41)
Albumin: 3.8 g/dL (ref 3.5–5.0)
Alkaline Phosphatase: 51 U/L (ref 38–126)
Anion gap: 11 (ref 5–15)
BUN: 26 mg/dL — ABNORMAL HIGH (ref 8–23)
CO2: 24 mmol/L (ref 22–32)
Calcium: 9.2 mg/dL (ref 8.9–10.3)
Chloride: 105 mmol/L (ref 98–111)
Creatinine, Ser: 1.48 mg/dL — ABNORMAL HIGH (ref 0.61–1.24)
GFR, Estimated: 47 mL/min — ABNORMAL LOW (ref >60)
Glucose, Bld: 106 mg/dL — ABNORMAL HIGH (ref 70–99)
Potassium: 4 mmol/L (ref 3.5–5.1)
Sodium: 140 mmol/L (ref 135–145)
Total Bilirubin: 1.6 mg/dL — ABNORMAL HIGH (ref 0.3–1.2)
Total Protein: 6.1 g/dL — ABNORMAL LOW (ref 6.5–8.1)

## 2022-05-06 LAB — CBG MONITORING, ED: Glucose-Capillary: 113 mg/dL — ABNORMAL HIGH (ref 70–99)

## 2022-05-06 MED ORDER — SODIUM CHLORIDE 0.9 % IV BOLUS
1000.0000 mL | Freq: Once | INTRAVENOUS | Status: AC
  Administered 2022-05-06: 1000 mL via INTRAVENOUS

## 2022-05-06 MED ORDER — LORAZEPAM 2 MG/ML IJ SOLN: 1.0000 mg | Freq: Once | INTRAMUSCULAR | Status: DC | PRN

## 2022-05-06 NOTE — ED Notes (Signed)
Patient transported to MRI 

## 2022-05-06 NOTE — ED Notes (Signed)
Discharge instructions reviewed with patient. Patient verbalized understanding of instructions. Follow-up care and medications were reviewed. Patient ambulatory with steady gait. VSS upon discharge.  ?

## 2022-05-06 NOTE — Discharge Instructions (Signed)
Your kidney function is slightly abnormal.  I recommend that you stay hydrated and talk to your doctor tomorrow regarding adjusting your diuretics  I also recommend that you stop atropine drops for now.  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  Return to ER if you have worse slurred speech, weakness, numbness, chest pain

## 2022-05-06 NOTE — ED Provider Triage Note (Signed)
Emergency Medicine Provider Triage Evaluation Note  Calvin Ramirez , a 81 y.o. male  was evaluated in triage.  Pt has Hx of dementia and Parkinson's, history is limited.  Mainly provided by spouse.  Spouse states there has been an acute change in mental status over the last 2 days.  No recent infection, urinary symptoms, fevers, chest pain, or cough.  No Hx of DMT2.  Review of Systems  Positive:  Negative: As above  Physical Exam  BP 129/80 (BP Location: Right Arm)   Pulse 67   Temp 98.5 F (36.9 C) (Oral)   Resp 16   SpO2 98%  Gen:   Awake, Hx of dementia Resp:  Normal effort, CTAB MSK:   Moves extremities with some difficulty Other:  Chest non-TTP.  HR 67 bpm.  Afebrile.  Abdomen soft nontender.  No flank pain.  Extremities appear neurovascular intact.  Aphasia not appreciated on exam.  No evidence of unilateral weakness appreciated.  Unable to accurately follow other commands of neuro exam.  Vision grossly aligned appropriately.  PERRLA.  No pronator drift or facial asymmetry  Medical Decision Making  Medically screening exam initiated at 4:41 PM.  Appropriate orders placed.  [name] was informed that the remainder of the evaluation will be completed by another provider, this initial triage assessment does not replace that evaluation, and the importance of remaining in the ED until their evaluation is complete.     Prince Rome, PA-C 79/02/40 1647

## 2022-05-06 NOTE — ED Provider Notes (Signed)
[facility] EMERGENCY DEPARTMENT Provider Note   CSN: [id number] Arrival date & time: 05/06/22  1543     History  No chief complaint on file.   Calvin Ramirez is a 81 y.o. male history of Lewy body dementia, here presenting with trouble speaking. Patient recently was diagnosed with Lewy body dementia.  Patient was given atropine drops under the tongue for the last week or so.  Per the wife, ever since he was started on it, he has been having progressive trouble speaking.  She states that in particular, over the last 2 days, he has been having slurred speech and intermittent confusion.  Denies any trouble urinating.  Denies any falls.  Patient went to [facility] walk-in clinic today and was sent here for rule out stroke.  The history is provided by the patient and a relative.       Home Medications Prior to Admission medications   Medication Sig Start Date End Date Taking? Authorizing Provider  aspirin EC 81 MG tablet Take 1 tablet (81 mg total) by mouth daily. Patient not taking: Reported on 10/03/2021 03/04/19   Belva Crome, MD  Cyanocobalamin (VITAMIN B-12 PO) Take 1,000 mg by mouth daily.    [provider]  donepezil (ARICEPT) 5 MG tablet TAKE ONE TABLET BY MOUTH DAILY AT BEDTIME Patient taking differently: 10 mg. 07/10/21   Kathrynn Ducking, MD  furosemide (LASIX) 40 MG tablet TAKE ONE TABLET BY MOUTH ONE TIME DAILY. 03/21/22   Belva Crome, MD  losartan (COZAAR) 50 MG tablet Take 1 tablet (50 mg total) by mouth daily. 04/21/18 10/03/21  Belva Crome, MD  Melatonin 5 MG CAPS Take 1 capsule by mouth as needed (sleep).    [provider]  nitroGLYCERIN (NITROSTAT) 0.4 MG SL tablet Place 1 tablet (0.4 mg total) under the tongue every 5 (five) minutes as needed for chest pain. 10/14/19 10/03/21  Belva Crome, MD  rosuvastatin (CRESTOR) 20 MG tablet TAKE ONE TABLET BY MOUTH ONE TIME DAILY 02/18/22   Belva Crome, MD  spironolactone (ALDACTONE)  25 MG tablet Take 0.5 tablets (12.5 mg total) by mouth daily. 03/20/22   Belva Crome, MD  traZODone (DESYREL) 100 MG tablet Take 100 mg by mouth at bedtime.    [provider]  Turmeric (QC TUMERIC COMPLEX PO) Take 1 tablet by mouth daily.    [provider]  VITAMIN D PO Take 1,000 mcg by mouth daily.    [provider]      Allergies    Levaquin [levofloxacin hemihydrate] and Penicillins    Review of Systems   Review of Systems  Neurological:  Positive for speech difficulty.  Psychiatric/Behavioral:  Positive for confusion.   All other systems reviewed and are negative.   Physical Exam Updated Vital Signs BP (!) 111/97   Pulse 64   Temp 98.5 F (36.9 C) (Oral)   Resp (!) 25   SpO2 96%  Physical Exam Vitals and nursing note reviewed.  Constitutional:      Comments: Chronically ill, has baseline tremors   HENT:     Head: Normocephalic.     Nose: Nose normal.     Mouth/Throat:     Mouth: Mucous membranes are moist.  Eyes:     Extraocular Movements: Extraocular movements intact.     Pupils: Pupils are equal, round, and reactive to light.  Cardiovascular:     Rate and Rhythm: Normal rate and regular rhythm.  Pulses: Normal pulses.     Heart sounds: Normal heart sounds.  Pulmonary:     Effort: Pulmonary effort is normal.     Breath sounds: Normal breath sounds.  Abdominal:     General: Abdomen is flat.     Palpations: Abdomen is soft.  Musculoskeletal:        General: Normal range of motion.     Cervical back: Normal range of motion and neck supple.  Skin:    General: Skin is warm.     Capillary Refill: Capillary refill takes less than 2 seconds.  Neurological:     Comments: Patient is ANO x1.  Patient has baseline tremors.  Patient appears to have normal speech right now.  No obvious facial droop.  Patient has 5 out of 5 strength bilateral arms and legs     ED Results / Procedures / Treatments   Labs (all labs ordered are listed,  but only abnormal results are displayed) Labs Reviewed  COMPREHENSIVE METABOLIC PANEL - Abnormal; Notable for the following components:      Result Value   Glucose, Bld 106 (*)    BUN 26 (*)    Creatinine, Ser 1.48 (*)    Total Protein 6.1 (*)    Total Bilirubin 1.6 (*)    GFR, Estimated 47 (*)    All other components within normal limits  CBC WITH DIFFERENTIAL/PLATELET - Abnormal; Notable for the following components:   RBC 3.96 (*)    Hemoglobin 12.5 (*)    nRBC 0.3 (*)    All other components within normal limits  CBG MONITORING, ED - Abnormal; Notable for the following components:   Glucose-Capillary 113 (*)    All other components within normal limits  URINE CULTURE  URINALYSIS, ROUTINE W REFLEX MICROSCOPIC    EKG EKG Interpretation  Date/Time:  Monday May 06 2022 16:22:25 EDT Ventricular Rate:  61 PR Interval:  230 QRS Duration: 104 QT Interval:  460 QTC Calculation: 463 R Axis:   -77 Text Interpretation: Sinus rhythm with sinus arrhythmia with 1st degree A-V block Left axis deviation Inferior infarct , age undetermined Anterior infarct , age undetermined Abnormal ECG No significant change since last tracing When compared with ECG of 10-Mar-2018 04:54, PREVIOUS ECG IS PRESENT Confirmed by Wandra Arthurs [phone]) on 05/06/2022 6:40:05 PM  Radiology MR BRAIN WO CONTRAST  Result Date: 05/06/2022 CLINICAL DATA:  Initial evaluation for acute TIA, slurred speech. EXAM: MRI HEAD WITHOUT CONTRAST TECHNIQUE: Multiplanar, multiecho pulse sequences of the brain and surrounding structures were obtained without intravenous contrast. COMPARISON:  Prior CT from earlier the same day. FINDINGS: Brain: Diffuse prominence of the CSF containing spaces compatible with generalized cerebral atrophy, moderately advanced in nature. No significant cerebral white matter disease for age. No abnormal foci of restricted diffusion to suggest acute or subacute ischemia. Gray-white matter differentiation  maintained. No areas of chronic cortical infarction. No acute or chronic intracranial blood products. No mass lesion, midline shift or mass effect. No hydrocephalus or extra-axial fluid collection. Pituitary gland suprasellar region normal. Vascular: Major intracranial vascular flow voids are maintained./ Skull and upper cervical spine: Craniocervical junction within normal limits. Bone marrow signal intensity normal. No scalp soft tissue abnormality. Sinuses/Orbits: Prior bilateral ocular lens replacement. Globes orbital soft tissues demonstrate no acute finding. Paranasal sinuses are largely clear. No mastoid effusion. Other: None. IMPRESSION: 1. No acute intracranial abnormality. 2. Moderately advanced cerebral atrophy. Electronically Signed   By: Jeannine Boga M.D.   On: 05/06/2022 22:31  CT Head Wo Contrast  Result Date: 05/06/2022 CLINICAL DATA:  Mental status change, unknown cause EXAM: CT HEAD WITHOUT CONTRAST TECHNIQUE: Contiguous axial images were obtained from the base of the skull through the vertex without intravenous contrast. RADIATION DOSE REDUCTION: This exam was performed according to the departmental dose-optimization program which includes automated exposure control, adjustment of the mA and/or kV according to patient size and/or use of iterative reconstruction technique. COMPARISON:  MRI head 12/12/2020. CT head March 10, 2018. FINDINGS: Brain: No evidence of acute large vascular territory infarction, hemorrhage, hydrocephalus, extra-axial collection or mass lesion/mass effect. Vascular: No hyperdense vessel identified. Skull: No acute fracture. Sinuses/Orbits: Clear sinuses.  No acute orbital findings. Other: No mastoid effusions. IMPRESSION: No evidence of acute intracranial abnormality. Electronically Signed   By: Margaretha Sheffield M.D.   On: 05/06/2022 17:34   DG Chest 2 View  Result Date: 05/06/2022 CLINICAL DATA:  Weakness, change in mental status EXAM: CHEST - 2 VIEW  COMPARISON:  Chest radiograph February 04, 2018. FINDINGS: No consolidation. No visible pleural effusions or pneumothorax. Mild enlargement of the cardiac silhouette. CABG and median sternotomy. IMPRESSION: No evidence of acute abnormality. Electronically Signed   By: Margaretha Sheffield M.D.   On: 05/06/2022 17:25    Procedures Procedures    Medications Ordered in ED Medications  LORazepam (ATIVAN) injection 1 mg (has no administration in time range)  sodium chloride 0.9 % bolus 1,000 mL (1,000 mLs Intravenous New Bag/Given 05/06/22 2200)    ED Course/ Medical Decision Making/ A&P                           Medical Decision Making ABDINASIR SPADAFORE is a 81 y.o. male here presenting with intermittent slurred speech.  He has Lewy body dementia.  I wonder if this is dementia.  Also consider electrolyte abnormality versus UTI.  Plan to get CT head and MRI brain and labs and urinalysis chest x-ray.  10:50 PM I reviewed patient's labs and independently reviewed his imaging studies.  Patient has mild AKI with creatinine of 1.48.  His baseline creatinine is around 1.1.  Urinalysis and chest x-ray and other electrolytes unremarkable.  Patient's MRI did not show a stroke.  I think his occasional slurred speech likely from mild dehydration.  Patient is on atropine drops under the tongue as prescribed by his neurologist at [facility].  I told him to stop that for now.  Wife states that she can call his PCP regarding medication adjustments to help improve his kidney function.  Patient received 1 L normal with saline bolus in the ED.  Stable for discharge  Problems Addressed: AKI (acute kidney injury) ([place]): acute illness or injury Slurred speech: acute illness or injury  Amount and/or Complexity of Data Reviewed Labs: ordered. Decision-making details documented in ED Course. Radiology: ordered and independent interpretation performed. Decision-making details documented in ED Course. ECG/medicine tests: ordered  and independent interpretation performed. Decision-making details documented in ED Course.  Risk Prescription drug management.    Final Clinical Impression(s) / ED Diagnoses Final diagnoses:  None    Rx / DC Orders ED Discharge Orders     None         Drenda Freeze, MD 05/06/22 2252

## 2022-05-06 NOTE — ED Triage Notes (Signed)
Pt with hx of Parkinson's Disease sent here from [facility] walk-in clinic with wife for eval of two days of acute change in mental status, mobility, and slurred speech. Pt started new medication 1.5 weeks ago, but wife cannot remember the name, although she knows it's drops on his tongue. Pt c/o throat/tongue pain.

## 2022-05-07 LAB — URINE CULTURE: Culture: NO GROWTH

## 2022-07-24 DIAGNOSIS — Z23 Encounter for immunization: Secondary | ICD-10-CM | POA: Diagnosis not present

## 2022-08-06 DIAGNOSIS — G3183 Dementia with Lewy bodies: Secondary | ICD-10-CM | POA: Diagnosis not present

## 2022-08-06 DIAGNOSIS — F02A0 Dementia in other diseases classified elsewhere, mild, without behavioral disturbance, psychotic disturbance, mood disturbance, and anxiety: Secondary | ICD-10-CM | POA: Diagnosis not present

## 2022-08-06 DIAGNOSIS — Z6826 Body mass index (BMI) 26.0-26.9, adult: Secondary | ICD-10-CM | POA: Diagnosis not present

## 2022-08-19 DIAGNOSIS — M9903 Segmental and somatic dysfunction of lumbar region: Secondary | ICD-10-CM | POA: Diagnosis not present

## 2022-08-19 DIAGNOSIS — M9905 Segmental and somatic dysfunction of pelvic region: Secondary | ICD-10-CM | POA: Diagnosis not present

## 2022-08-19 DIAGNOSIS — M25551 Pain in right hip: Secondary | ICD-10-CM | POA: Diagnosis not present

## 2022-08-19 DIAGNOSIS — M5137 Other intervertebral disc degeneration, lumbosacral region: Secondary | ICD-10-CM | POA: Diagnosis not present

## 2022-08-20 DIAGNOSIS — M5137 Other intervertebral disc degeneration, lumbosacral region: Secondary | ICD-10-CM | POA: Diagnosis not present

## 2022-08-20 DIAGNOSIS — M25551 Pain in right hip: Secondary | ICD-10-CM | POA: Diagnosis not present

## 2022-08-20 DIAGNOSIS — M9903 Segmental and somatic dysfunction of lumbar region: Secondary | ICD-10-CM | POA: Diagnosis not present

## 2022-08-20 DIAGNOSIS — M9905 Segmental and somatic dysfunction of pelvic region: Secondary | ICD-10-CM | POA: Diagnosis not present

## 2022-08-22 DIAGNOSIS — M25551 Pain in right hip: Secondary | ICD-10-CM | POA: Diagnosis not present

## 2022-08-22 DIAGNOSIS — M9905 Segmental and somatic dysfunction of pelvic region: Secondary | ICD-10-CM | POA: Diagnosis not present

## 2022-08-22 DIAGNOSIS — M9903 Segmental and somatic dysfunction of lumbar region: Secondary | ICD-10-CM | POA: Diagnosis not present

## 2022-08-22 DIAGNOSIS — M5137 Other intervertebral disc degeneration, lumbosacral region: Secondary | ICD-10-CM | POA: Diagnosis not present

## 2022-08-24 DIAGNOSIS — Z23 Encounter for immunization: Secondary | ICD-10-CM | POA: Diagnosis not present

## 2022-08-26 DIAGNOSIS — M25551 Pain in right hip: Secondary | ICD-10-CM | POA: Diagnosis not present

## 2022-08-26 DIAGNOSIS — M9903 Segmental and somatic dysfunction of lumbar region: Secondary | ICD-10-CM | POA: Diagnosis not present

## 2022-08-26 DIAGNOSIS — M9905 Segmental and somatic dysfunction of pelvic region: Secondary | ICD-10-CM | POA: Diagnosis not present

## 2022-08-26 DIAGNOSIS — M5137 Other intervertebral disc degeneration, lumbosacral region: Secondary | ICD-10-CM | POA: Diagnosis not present

## 2022-08-29 DIAGNOSIS — M9903 Segmental and somatic dysfunction of lumbar region: Secondary | ICD-10-CM | POA: Diagnosis not present

## 2022-08-29 DIAGNOSIS — M25551 Pain in right hip: Secondary | ICD-10-CM | POA: Diagnosis not present

## 2022-08-29 DIAGNOSIS — M9905 Segmental and somatic dysfunction of pelvic region: Secondary | ICD-10-CM | POA: Diagnosis not present

## 2022-08-29 DIAGNOSIS — M5137 Other intervertebral disc degeneration, lumbosacral region: Secondary | ICD-10-CM | POA: Diagnosis not present

## 2022-09-02 DIAGNOSIS — M9903 Segmental and somatic dysfunction of lumbar region: Secondary | ICD-10-CM | POA: Diagnosis not present

## 2022-09-02 DIAGNOSIS — M9905 Segmental and somatic dysfunction of pelvic region: Secondary | ICD-10-CM | POA: Diagnosis not present

## 2022-09-02 DIAGNOSIS — M5137 Other intervertebral disc degeneration, lumbosacral region: Secondary | ICD-10-CM | POA: Diagnosis not present

## 2022-09-02 DIAGNOSIS — M25551 Pain in right hip: Secondary | ICD-10-CM | POA: Diagnosis not present

## 2022-09-05 DIAGNOSIS — M25551 Pain in right hip: Secondary | ICD-10-CM | POA: Diagnosis not present

## 2022-09-05 DIAGNOSIS — M5137 Other intervertebral disc degeneration, lumbosacral region: Secondary | ICD-10-CM | POA: Diagnosis not present

## 2022-09-05 DIAGNOSIS — M9903 Segmental and somatic dysfunction of lumbar region: Secondary | ICD-10-CM | POA: Diagnosis not present

## 2022-09-05 DIAGNOSIS — M9905 Segmental and somatic dysfunction of pelvic region: Secondary | ICD-10-CM | POA: Diagnosis not present

## 2022-09-10 DIAGNOSIS — M25551 Pain in right hip: Secondary | ICD-10-CM | POA: Diagnosis not present

## 2022-09-10 DIAGNOSIS — M9903 Segmental and somatic dysfunction of lumbar region: Secondary | ICD-10-CM | POA: Diagnosis not present

## 2022-09-10 DIAGNOSIS — M9905 Segmental and somatic dysfunction of pelvic region: Secondary | ICD-10-CM | POA: Diagnosis not present

## 2022-09-10 DIAGNOSIS — M5137 Other intervertebral disc degeneration, lumbosacral region: Secondary | ICD-10-CM | POA: Diagnosis not present

## 2022-09-12 DIAGNOSIS — M9903 Segmental and somatic dysfunction of lumbar region: Secondary | ICD-10-CM | POA: Diagnosis not present

## 2022-09-12 DIAGNOSIS — M5137 Other intervertebral disc degeneration, lumbosacral region: Secondary | ICD-10-CM | POA: Diagnosis not present

## 2022-09-12 DIAGNOSIS — M9905 Segmental and somatic dysfunction of pelvic region: Secondary | ICD-10-CM | POA: Diagnosis not present

## 2022-09-12 DIAGNOSIS — M25551 Pain in right hip: Secondary | ICD-10-CM | POA: Diagnosis not present

## 2022-09-17 DIAGNOSIS — M25551 Pain in right hip: Secondary | ICD-10-CM | POA: Diagnosis not present

## 2022-09-17 DIAGNOSIS — M9905 Segmental and somatic dysfunction of pelvic region: Secondary | ICD-10-CM | POA: Diagnosis not present

## 2022-09-17 DIAGNOSIS — M5137 Other intervertebral disc degeneration, lumbosacral region: Secondary | ICD-10-CM | POA: Diagnosis not present

## 2022-09-17 DIAGNOSIS — M9903 Segmental and somatic dysfunction of lumbar region: Secondary | ICD-10-CM | POA: Diagnosis not present

## 2022-09-20 DIAGNOSIS — G20A1 Parkinson's disease without dyskinesia, without mention of fluctuations: Secondary | ICD-10-CM | POA: Diagnosis not present

## 2022-09-20 DIAGNOSIS — I2510 Atherosclerotic heart disease of native coronary artery without angina pectoris: Secondary | ICD-10-CM | POA: Diagnosis not present

## 2022-09-20 DIAGNOSIS — E7800 Pure hypercholesterolemia, unspecified: Secondary | ICD-10-CM | POA: Diagnosis not present

## 2022-09-20 DIAGNOSIS — F5101 Primary insomnia: Secondary | ICD-10-CM | POA: Diagnosis not present

## 2022-09-20 DIAGNOSIS — I10 Essential (primary) hypertension: Secondary | ICD-10-CM | POA: Diagnosis not present

## 2022-09-20 DIAGNOSIS — G20B2 Parkinson's disease with dyskinesia, with fluctuations: Secondary | ICD-10-CM | POA: Diagnosis not present

## 2022-09-20 DIAGNOSIS — F02B0 Dementia in other diseases classified elsewhere, moderate, without behavioral disturbance, psychotic disturbance, mood disturbance, and anxiety: Secondary | ICD-10-CM | POA: Diagnosis not present

## 2022-09-24 DIAGNOSIS — M5137 Other intervertebral disc degeneration, lumbosacral region: Secondary | ICD-10-CM | POA: Diagnosis not present

## 2022-09-24 DIAGNOSIS — M9903 Segmental and somatic dysfunction of lumbar region: Secondary | ICD-10-CM | POA: Diagnosis not present

## 2022-09-24 DIAGNOSIS — M9905 Segmental and somatic dysfunction of pelvic region: Secondary | ICD-10-CM | POA: Diagnosis not present

## 2022-09-24 DIAGNOSIS — M25551 Pain in right hip: Secondary | ICD-10-CM | POA: Diagnosis not present

## 2022-10-01 DIAGNOSIS — M9905 Segmental and somatic dysfunction of pelvic region: Secondary | ICD-10-CM | POA: Diagnosis not present

## 2022-10-01 DIAGNOSIS — M9903 Segmental and somatic dysfunction of lumbar region: Secondary | ICD-10-CM | POA: Diagnosis not present

## 2022-10-01 DIAGNOSIS — M25551 Pain in right hip: Secondary | ICD-10-CM | POA: Diagnosis not present

## 2022-10-01 DIAGNOSIS — M5137 Other intervertebral disc degeneration, lumbosacral region: Secondary | ICD-10-CM | POA: Diagnosis not present

## 2022-10-15 DIAGNOSIS — M9903 Segmental and somatic dysfunction of lumbar region: Secondary | ICD-10-CM | POA: Diagnosis not present

## 2022-10-15 DIAGNOSIS — M5137 Other intervertebral disc degeneration, lumbosacral region: Secondary | ICD-10-CM | POA: Diagnosis not present

## 2022-10-15 DIAGNOSIS — M25551 Pain in right hip: Secondary | ICD-10-CM | POA: Diagnosis not present

## 2022-10-15 DIAGNOSIS — M9905 Segmental and somatic dysfunction of pelvic region: Secondary | ICD-10-CM | POA: Diagnosis not present

## 2022-10-24 ENCOUNTER — Ambulatory Visit (INDEPENDENT_AMBULATORY_CARE_PROVIDER_SITE_OTHER): Payer: Medicare Other | Admitting: Podiatry

## 2022-10-24 DIAGNOSIS — B351 Tinea unguium: Secondary | ICD-10-CM | POA: Diagnosis not present

## 2022-10-24 DIAGNOSIS — G20A1 Parkinson's disease without dyskinesia, without mention of fluctuations: Secondary | ICD-10-CM | POA: Diagnosis not present

## 2022-10-24 DIAGNOSIS — M79675 Pain in left toe(s): Secondary | ICD-10-CM | POA: Diagnosis not present

## 2022-10-24 DIAGNOSIS — M79674 Pain in right toe(s): Secondary | ICD-10-CM

## 2022-10-24 DIAGNOSIS — M2041 Other hammer toe(s) (acquired), right foot: Secondary | ICD-10-CM

## 2022-10-24 DIAGNOSIS — L84 Corns and callosities: Secondary | ICD-10-CM

## 2022-10-24 NOTE — Progress Notes (Signed)
  Subjective:  Patient ID: Calvin Ramirez, male    DOB: 11/18/1941,  MRN: [id number]  Chief Complaint  Patient presents with   Nail Problem    Patient is here complaining of thick painful bilateral toenails.    81 y.o. male presents with the above complaint. History confirmed with patient. Patient presenting with pain related to dystrophic thickened elongated nails. Patient is unable to trim own nails related to nail dystrophy and/or mobility issues. Patient does not have a history of T2DM. Patient does/does not have callus present located at the distal aspect of the right 2nd toe causing pain.   Objective:  Physical Exam: warm, good capillary refill nail exam onychomycosis of the toenails, onycholysis, and dystrophic nails DP pulses palpable, PT pulses palpable, and protective sensation intact Left Foot:  Pain with palpation of nails due to elongation and dystrophic growth.  Right Foot: Pain with palpation of nails due to elongation and dystrophic growth. Pre ulcerative callus present at the distal tuft of the right 2nd toe.   Assessment:   1. Pain due to onychomycosis of toenails of both feet   2. Pre-ulcerative calluses   3. Hammertoe of second toe of right foot   4. Parkinson's disease, unspecified whether dyskinesia present, unspecified whether manifestations fluctuate      Plan:  Patient was evaluated and treated and all questions answered.  #Hyperkeratotic lesions/pre ulcerative calluses present distal aspect 2nd toe right foot All symptomatic hyperkeratoses x 1 separate lesions were safely debrided with a sterile #10 blade to patient's level of comfort without incident. We discussed preventative and palliative care of these lesions including supportive and accommodative shoegear, padding, prefabricated and custom molded accommodative orthoses, use of a pumice stone and lotions/creams daily.  #Onychomycosis with pain  -Nails palliatively debrided as below. -Educated on  self-care  Procedure: Nail Debridement Rationale: Pain Type of Debridement: manual, sharp debridement. Instrumentation: Nail nipper, rotary burr. Number of Nails: 10  Return in about 3 months (around 01/23/2023) for RFC.         Everitt Amber, DPM [facility]

## 2022-10-30 ENCOUNTER — Telehealth: Payer: Self-pay | Admitting: Interventional Cardiology

## 2022-10-30 NOTE — Telephone Encounter (Signed)
New Message:      Patient's wife called. She wants to know who does Dr Tamala Julian suggest that the patient see, since he is retiring please?

## 2022-10-31 NOTE — Telephone Encounter (Signed)
Left message for patient to callback to schedule overdue 1 year follow-up with Dr. Tamala Julian.  At time of call 11/04/22 at 2:40pm available.

## 2022-11-16 ENCOUNTER — Other Ambulatory Visit: Payer: Self-pay | Admitting: Interventional Cardiology

## 2022-11-22 DIAGNOSIS — R5383 Other fatigue: Secondary | ICD-10-CM | POA: Diagnosis not present

## 2022-11-22 DIAGNOSIS — B349 Viral infection, unspecified: Secondary | ICD-10-CM | POA: Diagnosis not present

## 2022-11-22 DIAGNOSIS — U071 COVID-19: Secondary | ICD-10-CM | POA: Diagnosis not present

## 2022-11-22 DIAGNOSIS — R509 Fever, unspecified: Secondary | ICD-10-CM | POA: Diagnosis not present

## 2022-11-24 ENCOUNTER — Encounter (HOSPITAL_COMMUNITY): Payer: Self-pay

## 2022-11-24 ENCOUNTER — Other Ambulatory Visit: Payer: Self-pay

## 2022-11-24 ENCOUNTER — Emergency Department (HOSPITAL_COMMUNITY): Payer: Medicare Other

## 2022-11-24 ENCOUNTER — Inpatient Hospital Stay (HOSPITAL_COMMUNITY)
Admission: EM | Admit: 2022-11-24 | Discharge: 2022-12-26 | DRG: 177 | Disposition: E | Payer: Medicare Other | Attending: Internal Medicine | Admitting: Internal Medicine

## 2022-11-24 DIAGNOSIS — I959 Hypotension, unspecified: Secondary | ICD-10-CM | POA: Diagnosis present

## 2022-11-24 DIAGNOSIS — G9341 Metabolic encephalopathy: Secondary | ICD-10-CM | POA: Diagnosis present

## 2022-11-24 DIAGNOSIS — E785 Hyperlipidemia, unspecified: Secondary | ICD-10-CM | POA: Diagnosis present

## 2022-11-24 DIAGNOSIS — Z8249 Family history of ischemic heart disease and other diseases of the circulatory system: Secondary | ICD-10-CM | POA: Diagnosis not present

## 2022-11-24 DIAGNOSIS — I2510 Atherosclerotic heart disease of native coronary artery without angina pectoris: Secondary | ICD-10-CM | POA: Diagnosis present

## 2022-11-24 DIAGNOSIS — F0280 Dementia in other diseases classified elsewhere without behavioral disturbance: Secondary | ICD-10-CM | POA: Diagnosis present

## 2022-11-24 DIAGNOSIS — I4892 Unspecified atrial flutter: Secondary | ICD-10-CM | POA: Diagnosis present

## 2022-11-24 DIAGNOSIS — Z955 Presence of coronary angioplasty implant and graft: Secondary | ICD-10-CM

## 2022-11-24 DIAGNOSIS — I10 Essential (primary) hypertension: Secondary | ICD-10-CM | POA: Diagnosis present

## 2022-11-24 DIAGNOSIS — R410 Disorientation, unspecified: Secondary | ICD-10-CM | POA: Diagnosis not present

## 2022-11-24 DIAGNOSIS — I480 Paroxysmal atrial fibrillation: Secondary | ICD-10-CM | POA: Diagnosis not present

## 2022-11-24 DIAGNOSIS — I723 Aneurysm of iliac artery: Secondary | ICD-10-CM | POA: Diagnosis present

## 2022-11-24 DIAGNOSIS — Z515 Encounter for palliative care: Secondary | ICD-10-CM | POA: Diagnosis not present

## 2022-11-24 DIAGNOSIS — E860 Dehydration: Secondary | ICD-10-CM | POA: Diagnosis present

## 2022-11-24 DIAGNOSIS — I5032 Chronic diastolic (congestive) heart failure: Secondary | ICD-10-CM | POA: Diagnosis present

## 2022-11-24 DIAGNOSIS — R509 Fever, unspecified: Secondary | ICD-10-CM | POA: Diagnosis not present

## 2022-11-24 DIAGNOSIS — Z880 Allergy status to penicillin: Secondary | ICD-10-CM | POA: Diagnosis not present

## 2022-11-24 DIAGNOSIS — U071 COVID-19: Secondary | ICD-10-CM | POA: Diagnosis not present

## 2022-11-24 DIAGNOSIS — G20A1 Parkinson's disease without dyskinesia, without mention of fluctuations: Secondary | ICD-10-CM | POA: Diagnosis present

## 2022-11-24 DIAGNOSIS — J690 Pneumonitis due to inhalation of food and vomit: Secondary | ICD-10-CM | POA: Diagnosis not present

## 2022-11-24 DIAGNOSIS — R2681 Unsteadiness on feet: Secondary | ICD-10-CM | POA: Diagnosis present

## 2022-11-24 DIAGNOSIS — G9349 Other encephalopathy: Secondary | ICD-10-CM | POA: Diagnosis not present

## 2022-11-24 DIAGNOSIS — R0602 Shortness of breath: Secondary | ICD-10-CM | POA: Diagnosis not present

## 2022-11-24 DIAGNOSIS — Z9181 History of falling: Secondary | ICD-10-CM

## 2022-11-24 DIAGNOSIS — R29818 Other symptoms and signs involving the nervous system: Secondary | ICD-10-CM | POA: Diagnosis not present

## 2022-11-24 DIAGNOSIS — Z66 Do not resuscitate: Secondary | ICD-10-CM | POA: Diagnosis present

## 2022-11-24 DIAGNOSIS — Z881 Allergy status to other antibiotic agents status: Secondary | ICD-10-CM

## 2022-11-24 DIAGNOSIS — R918 Other nonspecific abnormal finding of lung field: Secondary | ICD-10-CM | POA: Diagnosis not present

## 2022-11-24 DIAGNOSIS — R4182 Altered mental status, unspecified: Secondary | ICD-10-CM | POA: Diagnosis not present

## 2022-11-24 DIAGNOSIS — N179 Acute kidney failure, unspecified: Secondary | ICD-10-CM | POA: Diagnosis not present

## 2022-11-24 DIAGNOSIS — Z951 Presence of aortocoronary bypass graft: Secondary | ICD-10-CM | POA: Diagnosis not present

## 2022-11-24 DIAGNOSIS — G9340 Encephalopathy, unspecified: Secondary | ICD-10-CM | POA: Insufficient documentation

## 2022-11-24 DIAGNOSIS — Z79899 Other long term (current) drug therapy: Secondary | ICD-10-CM

## 2022-11-24 DIAGNOSIS — G8194 Hemiplegia, unspecified affecting left nondominant side: Secondary | ICD-10-CM | POA: Diagnosis present

## 2022-11-24 DIAGNOSIS — Z7982 Long term (current) use of aspirin: Secondary | ICD-10-CM

## 2022-11-24 DIAGNOSIS — M1990 Unspecified osteoarthritis, unspecified site: Secondary | ICD-10-CM | POA: Diagnosis present

## 2022-11-24 LAB — I-STAT CHEM 8, ED
BUN: 29 mg/dL — ABNORMAL HIGH (ref 8–23)
Calcium, Ion: 1.14 mmol/L — ABNORMAL LOW (ref 1.15–1.40)
Chloride: 102 mmol/L (ref 98–111)
Creatinine, Ser: 1.3 mg/dL — ABNORMAL HIGH (ref 0.61–1.24)
Glucose, Bld: 97 mg/dL (ref 70–99)
HCT: 40 % (ref 39.0–52.0)
Hemoglobin: 13.6 g/dL (ref 13.0–17.0)
Potassium: 3.9 mmol/L (ref 3.5–5.1)
Sodium: 136 mmol/L (ref 135–145)
TCO2: 25 mmol/L (ref 22–32)

## 2022-11-24 LAB — URINALYSIS, ROUTINE W REFLEX MICROSCOPIC
Bacteria, UA: NONE SEEN
Bilirubin Urine: NEGATIVE
Glucose, UA: NEGATIVE mg/dL
Hgb urine dipstick: NEGATIVE
Ketones, ur: NEGATIVE mg/dL
Leukocytes,Ua: NEGATIVE
Nitrite: NEGATIVE
Protein, ur: NEGATIVE mg/dL
Specific Gravity, Urine: 1.013 (ref 1.005–1.030)
pH: 5 (ref 5.0–8.0)

## 2022-11-24 LAB — I-STAT VENOUS BLOOD GAS, ED
Acid-base deficit: 1 mmol/L (ref 0.0–2.0)
Bicarbonate: 23.6 mmol/L (ref 20.0–28.0)
Calcium, Ion: 1.14 mmol/L — ABNORMAL LOW (ref 1.15–1.40)
HCT: 39 % (ref 39.0–52.0)
Hemoglobin: 13.3 g/dL (ref 13.0–17.0)
O2 Saturation: 99 %
Potassium: 3.9 mmol/L (ref 3.5–5.1)
Sodium: 135 mmol/L (ref 135–145)
TCO2: 25 mmol/L (ref 22–32)
pCO2, Ven: 36.7 mmHg — ABNORMAL LOW (ref 44–60)
pH, Ven: 7.416 (ref 7.25–7.43)
pO2, Ven: 156 mmHg — ABNORMAL HIGH (ref 32–45)

## 2022-11-24 LAB — COMPREHENSIVE METABOLIC PANEL
ALT: 21 U/L (ref 0–44)
AST: 27 U/L (ref 15–41)
Albumin: 3.6 g/dL (ref 3.5–5.0)
Alkaline Phosphatase: 57 U/L (ref 38–126)
Anion gap: 12 (ref 5–15)
BUN: 30 mg/dL — ABNORMAL HIGH (ref 8–23)
CO2: 22 mmol/L (ref 22–32)
Calcium: 8.9 mg/dL (ref 8.9–10.3)
Chloride: 102 mmol/L (ref 98–111)
Creatinine, Ser: 1.37 mg/dL — ABNORMAL HIGH (ref 0.61–1.24)
GFR, Estimated: 52 mL/min — ABNORMAL LOW (ref >60)
Glucose, Bld: 102 mg/dL — ABNORMAL HIGH (ref 70–99)
Potassium: 3.9 mmol/L (ref 3.5–5.1)
Sodium: 136 mmol/L (ref 135–145)
Total Bilirubin: 1 mg/dL (ref 0.3–1.2)
Total Protein: 6.5 g/dL (ref 6.5–8.1)

## 2022-11-24 LAB — CBC WITH DIFFERENTIAL/PLATELET
Abs Immature Granulocytes: 0.01 10*3/uL (ref 0.00–0.07)
Basophils Absolute: 0 10*3/uL (ref 0.0–0.1)
Basophils Relative: 1 %
Eosinophils Absolute: 0 10*3/uL (ref 0.0–0.5)
Eosinophils Relative: 0 %
HCT: 38.9 % — ABNORMAL LOW (ref 39.0–52.0)
Hemoglobin: 13.2 g/dL (ref 13.0–17.0)
Immature Granulocytes: 0 %
Lymphocytes Relative: 13 %
Lymphs Abs: 0.7 10*3/uL (ref 0.7–4.0)
MCH: 31.1 pg (ref 26.0–34.0)
MCHC: 33.9 g/dL (ref 30.0–36.0)
MCV: 91.7 fL (ref 80.0–100.0)
Monocytes Absolute: 0.6 10*3/uL (ref 0.1–1.0)
Monocytes Relative: 13 %
Neutro Abs: 3.6 10*3/uL (ref 1.7–7.7)
Neutrophils Relative %: 73 %
Platelets: 148 10*3/uL — ABNORMAL LOW (ref 150–400)
RBC: 4.24 MIL/uL (ref 4.22–5.81)
RDW: 13.2 % (ref 11.5–15.5)
WBC: 4.9 10*3/uL (ref 4.0–10.5)
nRBC: 0 % (ref 0.0–0.2)

## 2022-11-24 LAB — LACTIC ACID, PLASMA
Lactic Acid, Venous: 0.9 mmol/L (ref 0.5–1.9)
Lactic Acid, Venous: 1.8 mmol/L (ref 0.5–1.9)

## 2022-11-24 LAB — RESP PANEL BY RT-PCR (RSV, FLU A&B, COVID)  RVPGX2
Influenza A by PCR: NEGATIVE
Influenza B by PCR: NEGATIVE
Resp Syncytial Virus by PCR: NEGATIVE
SARS Coronavirus 2 by RT PCR: POSITIVE — AB

## 2022-11-24 LAB — CBG MONITORING, ED: Glucose-Capillary: 101 mg/dL — ABNORMAL HIGH (ref 70–99)

## 2022-11-24 LAB — TROPONIN I (HIGH SENSITIVITY)
Troponin I (High Sensitivity): 63 ng/L — ABNORMAL HIGH (ref <18)
Troponin I (High Sensitivity): 82 ng/L — ABNORMAL HIGH (ref <18)

## 2022-11-24 MED ORDER — HALOPERIDOL LACTATE 5 MG/ML IJ SOLN
2.0000 mg | Freq: Four times a day (QID) | INTRAMUSCULAR | Status: DC | PRN
  Administered 2022-11-25: 2 mg via INTRAMUSCULAR
  Filled 2022-11-24: qty 1

## 2022-11-24 MED ORDER — OLANZAPINE 10 MG IM SOLR: 2.5000 mg | Freq: Four times a day (QID) | INTRAMUSCULAR | Status: DC | PRN

## 2022-11-24 MED ORDER — LABETALOL HCL 5 MG/ML IV SOLN: 10.0000 mg | INTRAVENOUS | Status: DC | PRN

## 2022-11-24 MED ORDER — LORAZEPAM 2 MG/ML IJ SOLN
0.5000 mg | Freq: Four times a day (QID) | INTRAMUSCULAR | Status: DC | PRN
  Administered 2022-11-25: 0.5 mg via INTRAVENOUS
  Filled 2022-11-24: qty 1

## 2022-11-24 MED ORDER — APIXABAN 5 MG PO TABS
5.0000 mg | ORAL_TABLET | Freq: Two times a day (BID) | ORAL | Status: DC
  Administered 2022-11-24 – 2022-11-30 (×13): 5 mg via ORAL
  Filled 2022-11-24 (×13): qty 1

## 2022-11-24 MED ORDER — MELATONIN 5 MG PO CAPS: 1.0000 | ORAL_CAPSULE | ORAL | Status: DC | PRN

## 2022-11-24 MED ORDER — SODIUM CHLORIDE 0.9 % IV BOLUS
500.0000 mL | Freq: Once | INTRAVENOUS | Status: AC
  Administered 2022-11-24: 500 mL via INTRAVENOUS

## 2022-11-24 MED ORDER — OLANZAPINE 10 MG IM SOLR
2.5000 mg | Freq: Once | INTRAMUSCULAR | Status: AC
  Administered 2022-11-24: 2.5 mg via INTRAMUSCULAR
  Filled 2022-11-24 (×2): qty 10

## 2022-11-24 MED ORDER — TRAZODONE HCL 100 MG PO TABS
100.0000 mg | ORAL_TABLET | Freq: Every day | ORAL | Status: DC
  Administered 2022-11-24 – 2022-11-26 (×3): 100 mg via ORAL
  Filled 2022-11-24 (×2): qty 1
  Filled 2022-11-24: qty 2

## 2022-11-24 MED ORDER — DONEPEZIL HCL 10 MG PO TABS
10.0000 mg | ORAL_TABLET | Freq: Every day | ORAL | Status: DC
  Administered 2022-11-24 – 2022-11-30 (×7): 10 mg via ORAL
  Filled 2022-11-24 (×7): qty 1

## 2022-11-24 MED ORDER — ENOXAPARIN SODIUM 40 MG/0.4ML IJ SOSY: 40.0000 mg | PREFILLED_SYRINGE | INTRAMUSCULAR | Status: DC

## 2022-11-24 MED ORDER — CARBIDOPA-LEVODOPA 25-100 MG PO TABS: 1.0000 | ORAL_TABLET | Freq: Three times a day (TID) | ORAL | Status: DC

## 2022-11-24 MED ORDER — CARBIDOPA-LEVODOPA ER 25-100 MG PO TBCR
1.0000 | EXTENDED_RELEASE_TABLET | Freq: Two times a day (BID) | ORAL | Status: DC
  Administered 2022-11-24 – 2022-11-30 (×13): 1 via ORAL
  Filled 2022-11-24 (×14): qty 1

## 2022-11-24 MED ORDER — SODIUM CHLORIDE 0.9 % IV SOLN: INTRAVENOUS | Status: AC

## 2022-11-24 MED ORDER — MELATONIN 5 MG PO TABS
5.0000 mg | ORAL_TABLET | Freq: Every evening | ORAL | Status: DC | PRN
  Administered 2022-11-25 – 2022-11-28 (×3): 5 mg via ORAL
  Filled 2022-11-24 (×3): qty 1

## 2022-11-24 MED ORDER — ASPIRIN 81 MG PO TBEC
81.0000 mg | DELAYED_RELEASE_TABLET | Freq: Every day | ORAL | Status: DC
  Administered 2022-11-24 – 2022-11-30 (×7): 81 mg via ORAL
  Filled 2022-11-24 (×7): qty 1

## 2022-11-24 MED ORDER — ROSUVASTATIN CALCIUM 20 MG PO TABS
20.0000 mg | ORAL_TABLET | Freq: Every day | ORAL | Status: DC
  Administered 2022-11-24 – 2022-11-30 (×7): 20 mg via ORAL
  Filled 2022-11-24 (×7): qty 1

## 2022-11-24 MED ORDER — HALOPERIDOL LACTATE 5 MG/ML IJ SOLN
INTRAMUSCULAR | Status: AC
  Administered 2022-11-24: 5 mg
  Filled 2022-11-24: qty 1

## 2022-11-24 NOTE — ED Triage Notes (Signed)
Pt arriving from home for altered mental status. Recently dx with COVID and is being treated outpatient. Pt found by wife urinating in the closet and called EMS for increased altered mental status.  Hx of dementia

## 2022-11-24 NOTE — Progress Notes (Signed)
ANTICOAGULATION CONSULT NOTE - Initial Consult  Pharmacy Consult for apixaban Indication: atrial fibrillation  Allergies  Allergen Reactions   Levaquin [Levofloxacin Hemihydrate] Itching and Rash   Penicillins Itching, Rash and Other (See Comments)    Has patient had a PCN reaction causing immediate rash, facial/tongue/throat swelling, SOB or lightheadedness with hypotension: Unknown Has patient had a PCN reaction causing severe rash involving mucus membranes or skin necrosis: No Has patient had a PCN reaction that required hospitalization: No Has patient had a PCN reaction occurring within the last 10 years: No If all of the above answers are "NO", then may proceed with Cephalosporin use.     Patient Measurements:   5'10" and 186 lb 09/2021  Vital Signs: Temp: 98.7 F (37.1 C) (12/31 1100) BP: 121/52 (12/31 1445) Pulse Rate: 66 (12/31 1445)  Labs: Recent Labs    11/14/2022 1153 11/22/2022 1233  HGB 13.2 13.3  13.6  HCT 38.9* 39.0  40.0  PLT 148*  --   CREATININE 1.37* 1.30*  TROPONINIHS 63*  --     CrCl cannot be calculated (Unknown ideal weight.).   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 He was found to be in [place]. Pharmacy consulted to dose apixaban. No AC PTA. No bleeding noted, Hgb  normal, platelets are just below low end of normal.  Full dose recommended for weight >60 kg and SCr <1.5.  Goal of Therapy:  Full anticoagulation Monitor platelets by anticoagulation protocol: Yes   Plan:  Apixaban 5 mg PO bid Educate prior to discharge Copay check on Tues Monitor for s/sx of bleeding  Thank you for involving pharmacy in this patient's care.  Renold Genta, PharmD, BCPS Clinical Pharmacist Clinical phone for 11/07/2022 is [phone] 11/16/2022 4:09 PM

## 2022-11-24 NOTE — ED Provider Notes (Signed)
[facility] EMERGENCY DEPARTMENT Provider Note   CSN: [id number] Arrival date & time: 11/10/2022  1059     History {Add pertinent medical, surgical, social history, OB history to HPI:1} Chief Complaint  Patient presents with   Altered Mental Status    Calvin Ramirez is a 81 y.o. male.  81 year old male prior medical history as detailed below presents for evaluation.  He is accompanied by his wife.  Wife provides majority of history given patient's dementia.  Patient with dementia at baseline.  He is alert and oriented x 1.  This is his mental status baseline.  Patient was diagnosed with COVID infection approximately 3 days ago.  Patient's wife reports that he has had a significant decline in function and increased confusion x 3 days.  Patient is on antiviral for COVID infection x 3 days.  No reported fevers at home.  Wife feels the patient may be taking slightly less p.o. given his COVID infection.  No reported significant cough or congestion.  Patient has exhibited increased confusion.  Apparently this morning he used a closet instead of the bathroom when he had to urinate.  The history is provided by the patient and medical records.       Home Medications Prior to Admission medications   Medication Sig Start Date End Date Taking? Authorizing Provider  aspirin EC 81 MG tablet Take 1 tablet (81 mg total) by mouth daily. 03/04/19   Belva Crome, MD  Cyanocobalamin (VITAMIN B-12 PO) Take 1,000 mg by mouth daily.    [provider]  donepezil (ARICEPT) 5 MG tablet TAKE ONE TABLET BY MOUTH DAILY AT BEDTIME Patient taking differently: 10 mg. 07/10/21   Kathrynn Ducking, MD  furosemide (LASIX) 40 MG tablet TAKE ONE TABLET BY MOUTH ONE TIME DAILY. 03/21/22   Belva Crome, MD  losartan (COZAAR) 50 MG tablet Take 1 tablet (50 mg total) by mouth daily. 04/21/18 10/03/21  Belva Crome, MD  Melatonin 5 MG CAPS Take 1 capsule by mouth as needed (sleep).     [provider]  nitroGLYCERIN (NITROSTAT) 0.4 MG SL tablet Place 1 tablet (0.4 mg total) under the tongue every 5 (five) minutes as needed for chest pain. 10/14/19 10/03/21  Belva Crome, MD  rosuvastatin (CRESTOR) 20 MG tablet TAKE ONE TABLET BY MOUTH ONE TIME DAILY 02/18/22   Belva Crome, MD  spironolactone (ALDACTONE) 25 MG tablet TAKE HALF TABLET BY MOUTH DAILY 11/19/22   Belva Crome, MD  traZODone (DESYREL) 100 MG tablet Take 100 mg by mouth at bedtime.    [provider]  Turmeric (QC TUMERIC COMPLEX PO) Take 1 tablet by mouth daily.    [provider]  VITAMIN D PO Take 1,000 mcg by mouth daily.    [provider]      Allergies    Levaquin [levofloxacin hemihydrate] and Penicillins    Review of Systems   Review of Systems  Unable to perform ROS: Dementia    Physical Exam Updated Vital Signs BP 117/82   Pulse 74   Temp 98.7 F (37.1 C)   Resp 16   SpO2 99%  Physical Exam Vitals and nursing note reviewed.  Constitutional:      General: He is not in acute distress.    Appearance: Normal appearance. He is well-developed.  HENT:     Head: Normocephalic and atraumatic.  Eyes:     Conjunctiva/sclera: Conjunctivae normal.     Pupils: Pupils  are equal, round, and reactive to light.  Cardiovascular:     Rate and Rhythm: Normal rate and regular rhythm.     Heart sounds: Normal heart sounds.  Pulmonary:     Effort: Pulmonary effort is normal. No respiratory distress.     Breath sounds: Normal breath sounds.  Abdominal:     General: There is no distension.     Palpations: Abdomen is soft.     Tenderness: There is no abdominal tenderness.  Musculoskeletal:        General: No deformity. Normal range of motion.     Cervical back: Normal range of motion and neck supple.  Skin:    General: Skin is warm and dry.  Neurological:     General: No focal deficit present.     Mental Status: He is alert.     Comments: Alert and oriented x  1, confused to events leading to ED visit  No facial droop  Patient is mumbling   5 out of 5 strength to BUE/BLE     ED Results / Procedures / Treatments   Labs (all labs ordered are listed, but only abnormal results are displayed) Labs Reviewed  RESP PANEL BY RT-PCR (RSV, FLU A&B, COVID)  RVPGX2  CULTURE, BLOOD (ROUTINE X 2)  CULTURE, BLOOD (ROUTINE X 2)  URINE CULTURE  LACTIC ACID, PLASMA  LACTIC ACID, PLASMA  COMPREHENSIVE METABOLIC PANEL  CBC WITH DIFFERENTIAL/PLATELET  URINALYSIS, ROUTINE W REFLEX MICROSCOPIC  I-STAT CHEM 8, ED  I-STAT VENOUS BLOOD GAS, ED  CBG MONITORING, ED  TROPONIN I (HIGH SENSITIVITY)    EKG None  Radiology No results found.  Procedures Procedures  {Document cardiac monitor, telemetry assessment procedure when appropriate:1}  Medications Ordered in ED Medications  sodium chloride 0.9 % bolus 500 mL (has no administration in time range)    ED Course/ Medical Decision Making/ A&P                           Medical Decision Making Amount and/or Complexity of Data Reviewed Labs: ordered. Radiology: ordered.    Medical Screen Complete  This patient presented to the ED with complaint of ***.  This complaint involves an extensive number of treatment options. The initial differential diagnosis includes, but is not limited to, ***  This presentation is: {IllnessRisk:19196::"***","Acute","Chronic","Self-Limited","Previously Undiagnosed","Uncertain Prognosis","Complicated","Systemic Symptoms","Threat to Life/Bodily Function"}    Co morbidities that complicated the patient's evaluation  ***   Additional history obtained:  Additional history obtained from {History source:19196::"EMS","Spouse","Family","Friend","Caregiver"} External records from outside sources obtained and reviewed including prior ED visits and prior Inpatient records.    Lab Tests:  I ordered and personally interpreted labs.  The pertinent results include:   ***   Imaging Studies ordered:  I ordered imaging studies including ***  I independently visualized and interpreted obtained imaging which showed *** I agree with the radiologist interpretation.   Cardiac Monitoring:  The patient was maintained on a cardiac monitor.  I personally viewed and interpreted the cardiac monitor which showed an underlying rhythm of: ***   Medicines ordered:  I ordered medication including ***  for ***  Reevaluation of the patient after these medicines showed that the patient: {resolved/improved/worsened:23923::"improved"}    Test Considered:  ***   Critical Interventions:  ***   Consultations Obtained:  I consulted ***,  and discussed lab and imaging findings as well as pertinent plan of care.    Problem List / ED Course:  ***  Reevaluation:  After the interventions noted above, I reevaluated the patient and found that they have: {resolved/improved/worsened:23923::"improved"}   Social Determinants of Health:  ***   Disposition:  After consideration of the diagnostic results and the patients response to treatment, I feel that the patent would benefit from ***.   {Document critical care time when appropriate:1} {Document review of labs and clinical decision tools ie heart score, Chads2Vasc2 etc:1}  {Document your independent review of radiology images, and any outside records:1} {Document your discussion with family members, caretakers, and with consultants:1} {Document social determinants of health affecting pt's care:1} {Document your decision making why or why not admission, treatments were needed:1} Final Clinical Impression(s) / ED Diagnoses Final diagnoses:  None    Rx / DC Orders ED Discharge Orders     None

## 2022-11-24 NOTE — H&P (Signed)
History and Physical    Calvin Ramirez XTG:[id number] DOB: Apr 07, 1941 DOA: 11/16/2022  PCP: Shirline Frees, MD (Confirm with patient/family/NH records and if not entered, this has to be entered at [facility] point of entry) Patient coming from: Home  I have personally briefly reviewed patient's old medical records in [place]  Chief Complaint: Patient is confused  HPI: Calvin Ramirez is a 81 y.o. male with medical history significant of Parkinson's dementia, CAD status post CABG, HTN, HLD, question of PAF, brought in by family member for evaluation of worsening mentations.  Patient is confused, unable to provide any history, or history provided by wife at bedside.  At baseline patient is demented, patient is demented but able to communicate with wife.  3 days ago, patient started to complain about dry mouth loss of taste, and COVID test was positive and for which patient was started on Molnupiravir since yesterday and the last dose was this morning.  Since yesterday, wife noticed patient has had episode of confusion and agitation with seemingly unsteady gait and sustained a fall yesterday no head injury and no LOC.  Denies any diarrhea no cough no fever.  This morning, patient was found to have new" shaking his whole body, and appears to have weakness on left side" which is new. ED Course: CT head negative for acute findings.  Chest x-ray negative for acute findings.  WBC 4.9, BMP creatinine 1.3 slightly elevated than baseline 1.0 couple months ago.  K3.9.  UA pending.  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   LIMA to LAD, SVG to OM2, EVH via right thigh   Stented coronary artery 2011   x3    Past Surgical History:  Procedure Laterality Date   APPENDECTOMY     BACK SURGERY  11/13/21   Laminectomy   CARDIAC CATHETERIZATION  2011   3 stents   COLONOSCOPY     CORONARY ARTERY BYPASS GRAFT N/A 02/25/2018   Procedure: CORONARY ARTERY BYPASS GRAFTING (CABG), ON PUMP, TIMES TWO, USING LEFT INTERNAL MAMMARY ARTERY AND ENDOSCOPICALLY HARVESTED RIGHT GREATER SAPHENOUS VEIN;  Surgeon: Rexene Alberts, MD;  Location: [facility];  Service: Open Heart Surgery;  Laterality: N/A;   RIGHT/LEFT HEART CATH AND CORONARY ANGIOGRAPHY N/A 02/24/2018   Procedure: RIGHT/LEFT HEART CATH AND CORONARY ANGIOGRAPHY;  Surgeon: Belva Crome, MD;  Location: [facility] LAB;  Service: Cardiovascular;  Laterality: N/A;   TEE WITHOUT CARDIOVERSION N/A 02/25/2018   Procedure: TRANSESOPHAGEAL ECHOCARDIOGRAM (TEE);  Surgeon: Rexene Alberts, MD;  Location: [facility];  Service: Open Heart Surgery;  Laterality: N/A;   TONSILLECTOMY       reports that he has never smoked. He has never used smokeless tobacco. He reports current alcohol use. He reports that he does not use drugs.  Allergies  Allergen Reactions   Levaquin [Levofloxacin Hemihydrate] Itching and Rash   Penicillins Itching, Rash and Other (See Comments)  Has patient had a PCN reaction causing immediate rash, facial/tongue/throat swelling, SOB or lightheadedness with hypotension: Unknown Has patient had a PCN reaction causing severe rash involving mucus membranes or skin necrosis: No Has patient had a PCN reaction that required hospitalization: No Has patient had a PCN reaction occurring within the last 10 years: No If all of the above answers are "NO", then may proceed with Cephalosporin use.     Family History   Problem Relation Age of Onset   Heart attack Mother    Cancer Father      Prior to Admission medications   Medication Sig Start Date End Date Taking? Authorizing Provider  aspirin EC 81 MG tablet Take 1 tablet (81 mg total) by mouth daily. 03/04/19   Belva Crome, MD  Cyanocobalamin (VITAMIN B-12 PO) Take 1,000 mg by mouth daily.    [provider]  donepezil (ARICEPT) 5 MG tablet TAKE ONE TABLET BY MOUTH DAILY AT BEDTIME Patient taking differently: 10 mg. 07/10/21   Kathrynn Ducking, MD  furosemide (LASIX) 40 MG tablet TAKE ONE TABLET BY MOUTH ONE TIME DAILY. 03/21/22   Belva Crome, MD  losartan (COZAAR) 50 MG tablet Take 1 tablet (50 mg total) by mouth daily. 04/21/18 10/03/21  Belva Crome, MD  Melatonin 5 MG CAPS Take 1 capsule by mouth as needed (sleep).    [provider]  nitroGLYCERIN (NITROSTAT) 0.4 MG SL tablet Place 1 tablet (0.4 mg total) under the tongue every 5 (five) minutes as needed for chest pain. 10/14/19 10/03/21  Belva Crome, MD  rosuvastatin (CRESTOR) 20 MG tablet TAKE ONE TABLET BY MOUTH ONE TIME DAILY 02/18/22   Belva Crome, MD  spironolactone (ALDACTONE) 25 MG tablet TAKE HALF TABLET BY MOUTH DAILY 11/19/22   Belva Crome, MD  traZODone (DESYREL) 100 MG tablet Take 100 mg by mouth at bedtime.    [provider]  Turmeric (QC TUMERIC COMPLEX PO) Take 1 tablet by mouth daily.    [provider]  VITAMIN D PO Take 1,000 mcg by mouth daily.    [provider]    Physical Exam: Vitals:   10/27/2022 1100 11/11/2022 1200 11/06/2022 1445  BP: 117/82 108/85 (!) 121/52  Pulse: 74 75 66  Resp: '16 17 18  '$ Temp: 98.7 F (37.1 C)    SpO2: 99% 99% 98%    Constitutional: NAD, calm, comfortable Vitals:   11/23/2022 1100 11/12/2022 1200 11/07/2022 1445  BP: 117/82 108/85 (!) 121/52  Pulse: 74 75 66  Resp: '16 17 18  '$ Temp: 98.7 F (37.1 C)    SpO2: 99% 99% 98%   Eyes: PERRL, lids and conjunctivae normal ENMT: Mucous membranes  are dry. Posterior pharynx clear of any exudate or lesions.Normal dentition.  Neck: normal, supple, no masses, no thyromegaly Respiratory: clear to auscultation bilaterally, no wheezing, no crackles. Normal respiratory effort. No accessory muscle use.  Cardiovascular: Regular rate and rhythm, no murmurs / rubs / gallops. No extremity edema. 2+ pedal pulses. No carotid bruits.  Abdomen: no tenderness, no masses palpated. No hepatosplenomegaly. Bowel sounds positive.  Musculoskeletal: no clubbing / cyanosis. No joint deformity upper and lower extremities. Good ROM, no contractures. Normal muscle tone.  Skin: no rashes, lesions, ulcers. No induration Neurologic: No facial droops, moving all limbs compared to have weakness on left arm compared to right side Psychiatric: Awake, agitated    Labs on Admission: I have personally reviewed following labs and imaging studies  CBC: Recent  Labs  Lab 10/28/2022 1153 11/02/2022 1233  WBC 4.9  --   NEUTROABS 3.6  --   HGB 13.2 13.3  13.6  HCT 38.9* 39.0  40.0  MCV 91.7  --   PLT 148*  --    Basic Metabolic Panel: Recent Labs  Lab 10/26/2022 1153 11/21/2022 1233  NA 136 135  136  K 3.9 3.9  3.9  CL 102 102  CO2 22  --   GLUCOSE 102* 97  BUN 30* 29*  CREATININE 1.37* 1.30*  CALCIUM 8.9  --    GFR: CrCl cannot be calculated (Unknown ideal weight.). Liver Function Tests: Recent Labs  Lab 11/13/2022 1153  AST 27  ALT 21  ALKPHOS 57  BILITOT 1.0  PROT 6.5  ALBUMIN 3.6   No results for input(s): "LIPASE", "AMYLASE" in the last 168 hours. No results for input(s): "AMMONIA" in the last 168 hours. Coagulation Profile: No results for input(s): "INR", "PROTIME" in the last 168 hours. Cardiac Enzymes: No results for input(s): "CKTOTAL", "CKMB", "CKMBINDEX", "TROPONINI" in the last 168 hours. BNP (last 3 results) No results for input(s): "PROBNP" in the last 8760 hours. HbA1C: No results for input(s): "HGBA1C" in the last 72  hours. CBG: Recent Labs  Lab 11/01/2022 1148  GLUCAP 101*   Lipid Profile: No results for input(s): "CHOL", "HDL", "LDLCALC", "TRIG", "CHOLHDL", "LDLDIRECT" in the last 72 hours. Thyroid Function Tests: No results for input(s): "TSH", "T4TOTAL", "FREET4", "T3FREE", "THYROIDAB" in the last 72 hours. Anemia Panel: No results for input(s): "VITAMINB12", "FOLATE", "FERRITIN", "TIBC", "IRON", "RETICCTPCT" in the last 72 hours. Urine analysis:    Component Value Date/Time   COLORURINE YELLOW 11/18/2022 [place] 11/07/2022 1105   LABSPEC 1.013 11/09/2022 1105   PHURINE 5.0 11/16/2022 1105   GLUCOSEU NEGATIVE 11/09/2022 1105   HGBUR NEGATIVE 11/15/2022 1105   BILIRUBINUR NEGATIVE 11/18/2022 1105   KETONESUR NEGATIVE 11/08/2022 1105   PROTEINUR NEGATIVE 11/13/2022 1105   NITRITE NEGATIVE 11/02/2022 1105   LEUKOCYTESUR NEGATIVE 11/16/2022 1105    Radiological Exams on Admission: CT Head Wo Contrast  Result Date: 11/14/2022 CLINICAL DATA:  Mental status change. Unknown cause. Recently diagnosed with COVID. Found by way urinating in the closet. History of dementia. EXAM: CT HEAD WITHOUT CONTRAST TECHNIQUE: Contiguous axial images were obtained from the base of the skull through the vertex without intravenous contrast. RADIATION DOSE REDUCTION: This exam was performed according to the departmental dose-optimization program which includes automated exposure control, adjustment of the mA and/or kV according to patient size and/or use of iterative reconstruction technique. COMPARISON:  CT brain 05/06/2022 FINDINGS: Brain: There is mild-to-moderate cortical atrophy, unchanged from prior and within normal limits for patient age. The ventricles are normal in configuration. The basilar cisterns are patent. No mass, mass effect, or midline shift. No acute intracranial hemorrhage is seen. No abnormal extra-axial fluid collection. Mild patchy periventricular white matter hypodensities are  similar to prior, most likely chronic ischemic white matter changes. Otherwise, preservation of the normal cortical gray-white interface without CT evidence of an acute major vascular territorial cortical based infarction. Vascular: No hyperdense vessel or unexpected calcification. Skull: Normal. Negative for fracture or focal lesion. Sinuses/Orbits: Status post bilateral ocular lens replacements. The frontal sinuses are again developmentally non aerated. Minimal peripheral mucosal thickening of the ethmoid air cells and left maxillary sinus, new from 04/29/2022. New small layering air-fluid level within right maxillary sinus. The mastoid air cells are clear. Other: None. IMPRESSION: 1. No acute intracranial process. 2.  Mild-to-moderate cortical atrophy and mild chronic ischemic white matter changes, similar to prior. 3. New small layering air-fluid level within the right maxillary sinus, nonspecific but can be seen with acute sinusitis in the appropriate clinical setting. Electronically Signed   By: Yvonne Kendall M.D.   On: 11/23/2022 11:50   DG Chest Port 1 View  Result Date: 11/09/2022 CLINICAL DATA:  Shortness of breath EXAM: PORTABLE CHEST 1 VIEW COMPARISON:  05/06/2022 FINDINGS: Stable cardiomegaly status post sternotomy and CABG. Aortic atherosclerosis. No focal airspace consolidation, pleural effusion, or pneumothorax. IMPRESSION: No active disease. Electronically Signed   By: Davina Poke D.O.   On: 10/26/2022 11:21    EKG: Independently reviewed.  A flutter with 3-1 transduction  Assessment/Plan Principal Problem:   AMS (altered mental status) Active Problems:   Parkinson disease   Paroxysmal atrial fibrillation (HCC)   Acute encephalopathy  (please populate well all problems here in Problem List. (For example, if patient is on BP meds at home and you resume or decide to hold them, it is a problem that needs to be her. Same for CAD, COPD, HLD and so on)  Acute metabolic  encephalopathy -Suspect side effect or drug interaction of molnupiravir -Discussed with wife, agreed with hold off molnupiravir for now, monitor COVID-19 pneumonia signs and symptoms. -Tried Zyprexa in the ED, and patient not responding.  Switch to IM haloperidol as needed for agitations, discussed with nurse. -N.p.o. for now and speech evaluation  PAF/paroxysmal a flutter -Discussed with on-call cardiology Dr. Harl Bowie who reviewed patient EKG, impression is patient has rate controlled a flutter at this point.  Discussed with wife, no previous history of intracranial bleeding or GI bleeding no blood transfusion before.  No significant gait problems or recurrent falls other than 1 episode of fall 2 days ago. -Start patient on Eliquis, pharmacy to dose. -Rate controlled, no indication for beta-blocker or CCB -Outpatient follow-up with cardiology  Question of stroke -Unable to perform full scale of neuroexam due to mentation changes but appears to have left-sided weakness, and patient also has increasing risk of stroke of PAF at this point. -Ordered MRI for tomorrow if mentation allows.  Volume contraction/dehydration -Slightly elevation of creatinine function, hold off diuresis -Trial of short course of IV fluid.  HTN -BP is borderline low probably from dehydration/volume contraction, hold off Lasix and Aldactone, and ARB due to kidney function surgery -As needed hydralazine for now  Parkinson's dementia -Continue Sinemet   DVT prophylaxis: Eliquis Code Status: Full code Family Communication: Wife at bedside Disposition Plan: Expect less than 2 midnight hospital stay Consults called: None Admission status: Tele observation   Lequita Halt MD [facility] Pager [phone]  11/09/2022, 4:07 PM

## 2022-11-24 NOTE — Evaluation (Signed)
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   CAD (coronary artery disease) 02/24/2018   CAD in native artery 04/26/2015   2011 Multivessel DES PCI LAD,RCA PLOM and distal RCA    Coronary artery disease    Dementia ([place])    Essential hypertension 04/21/2014   Hyperlipidemia    Parkinson's disease 04/27/2015   2016    Paroxysmal atrial fibrillation ([place]) 03/17/2018   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   LIMA to LAD, SVG to OM2, EVH via right thigh   Stented coronary artery 2011   x3   Past Surgical History:  Past Surgical History:  Procedure Laterality Date   APPENDECTOMY     BACK SURGERY  11/13/21   Laminectomy   CARDIAC CATHETERIZATION  2011   3 stents   COLONOSCOPY     CORONARY ARTERY BYPASS GRAFT N/A 02/25/2018   Procedure: CORONARY ARTERY BYPASS GRAFTING (CABG), ON PUMP, TIMES TWO, USING LEFT INTERNAL MAMMARY ARTERY AND ENDOSCOPICALLY HARVESTED RIGHT GREATER SAPHENOUS VEIN;  Surgeon: Rexene Alberts, MD;  Location: [facility];  Service: Open Heart Surgery;  Laterality: N/A;   RIGHT/LEFT HEART CATH AND CORONARY ANGIOGRAPHY N/A 02/24/2018   Procedure: RIGHT/LEFT HEART CATH AND CORONARY ANGIOGRAPHY;  Surgeon: Belva Crome, MD;  Location: [facility];  Service: Cardiovascular;  Laterality: N/A;   TEE WITHOUT CARDIOVERSION N/A 02/25/2018   Procedure: TRANSESOPHAGEAL ECHOCARDIOGRAM (TEE);  Surgeon: Rexene Alberts, MD;  Location: [facility];  Service: Open Heart Surgery;  Laterality: N/A;   TONSILLECTOMY     HPI:  Patient is an 81 y.o. male with PMH: Parkinson's disease, CAD s/p CABG, HTN, HLD,  lewy body dementia. He presented to the ED on 10/27/2022 for evaluation of worsening mentations.   Covid +    Assessment / Plan / Recommendation  Clinical Impression  Patient currently presenting with a cognitive-based dysphagia as per this bedside swallow evaluation. He was very fidgety, fixated on trying to take soft restraint mitts off hands and laying diagonally in bed. SLP was able to reposition him with a lot of effort as patient isnt able to consistently follow directions and he was not exhibiting adequate strength and was very stiff/ridged. After repositioning, SLP assessed his toleration of two ice chips. Initially he quickly masticated ice chip and initiated a swallow without significant delay but with second ice chip he started to masticate it, then held in mouth as he seemed more fixated on restraint mitts. He seemed to be holding some of the liquid in his mouth and no swallow palpated. SLP is recommending patient continue NPO status and will follow for readiness to trial PO's as his mentation hopefully improves. SLP Visit Diagnosis: Dysphagia, unspecified (R13.10)    Aspiration Risk  Risk for inadequate nutrition/hydration;Moderate aspiration risk    Diet Recommendation NPO   Medication Administration: Via alternative means    Other  Recommendations Oral Care Recommendations: Oral care QID;Staff/trained caregiver to provide oral care    Recommendations for follow up therapy are one component of a multi-disciplinary discharge planning process, led by the attending physician.  Recommendations may be updated based on patient  status, additional functional criteria and insurance authorization.  Follow up Recommendations Skilled nursing-short term rehab (<3 hours/day)      Assistance Recommended at Discharge    Functional Status Assessment Patient has had a recent decline in their functional status and demonstrates the ability to make significant improvements in function in a reasonable  and predictable amount of time.  Frequency and Duration min 2x/week  2 weeks       Prognosis Prognosis for Safe Diet Advancement: Fair Barriers to Reach Goals: Cognitive deficits;Severity of deficits      Swallow Study   General Date of Onset: 11/07/2022 HPI: Patient is an 81 y.o. male with PMH: Parkinson's disease, CAD s/p CABG, HTN, HLD, lewy body dementia. He presented to the ED on 11/13/2022 for evaluation of worsening mentations.   Covid + Type of Study: Bedside Swallow Evaluation Previous Swallow Assessment: none found Diet Prior to this Study: NPO Temperature Spikes Noted: No Respiratory Status: Room air History of Recent Intubation: No Behavior/Cognition: Alert;Confused;Distractible;Requires cueing Oral Cavity Assessment: Within Functional Limits Oral Care Completed by SLP: Yes Oral Cavity - Dentition: Adequate natural dentition Self-Feeding Abilities: Total assist Patient Positioning: Upright in bed Baseline Vocal Quality: Low vocal intensity Volitional Cough: Cognitively unable to elicit Volitional Swallow: Unable to elicit    Oral/Motor/Sensory Function Overall Oral Motor/Sensory Function: Other (comment) (difficult to fully assess secondary to patient's cognitive impairment)   Ice Chips Ice chips: Impaired Oral Phase Impairments: Poor awareness of bolus;Reduced lingual movement/coordination Oral Phase Functional Implications: Oral holding   Thin Liquid Thin Liquid: Not tested    Nectar Thick Nectar Thick Liquid: Not tested   Honey Thick Honey Thick Liquid: Not tested   Puree Puree: Not tested   Solid     Solid: Not tested      Sonia Baller, MA, CCC-SLP Speech Therapy

## 2022-11-24 NOTE — ED Notes (Signed)
Sleeping   at present mittens remain in place

## 2022-11-25 ENCOUNTER — Observation Stay (HOSPITAL_COMMUNITY): Payer: Medicare Other

## 2022-11-25 DIAGNOSIS — R29818 Other symptoms and signs involving the nervous system: Secondary | ICD-10-CM | POA: Diagnosis not present

## 2022-11-25 DIAGNOSIS — G9340 Encephalopathy, unspecified: Secondary | ICD-10-CM

## 2022-11-25 DIAGNOSIS — U071 COVID-19: Secondary | ICD-10-CM

## 2022-11-25 DIAGNOSIS — I480 Paroxysmal atrial fibrillation: Secondary | ICD-10-CM

## 2022-11-25 DIAGNOSIS — R4182 Altered mental status, unspecified: Secondary | ICD-10-CM | POA: Diagnosis not present

## 2022-11-25 DIAGNOSIS — G20A1 Parkinson's disease without dyskinesia, without mention of fluctuations: Secondary | ICD-10-CM | POA: Diagnosis not present

## 2022-11-25 DIAGNOSIS — R410 Disorientation, unspecified: Secondary | ICD-10-CM | POA: Diagnosis not present

## 2022-11-25 MED ORDER — SODIUM CHLORIDE 0.9 % IV SOLN: INTRAVENOUS | Status: AC

## 2022-11-25 NOTE — Evaluation (Signed)
Occupational Therapy Evaluation Patient Details Name: Calvin Ramirez MRN: [id number] DOB: 1941-02-02 Today's Date: 11/25/2022   History of Present Illness Patient is an 82 y.o. male with PMH: Parkinson's disease, CAD s/p CABG, HTN, HLD, lewy body dementia. He presented to the ED on 11/06/2022 for evaluation of worsening mentations and fall at home.   Covid +   Clinical Impression   PTA pt lives at home with wife, who assists as needed with ADL tasks. At baseline, Calvin Ramirez is able to bath and dress himself with occasional VC, ambulates without an AD and attends a boxing class 2x/wk. Wife states Calvin Ramirez's confusion started after taking "that COVID medicine". Calvin Ramirez currently has difficulty focusing attention on his activities to complete a task. He was willing to put on "boxing gloves/mittens" and participated in a "jabbing" session as this is familiar to him from his boxing class. Appears to be hallucinating at times. If cognition improves, feel Calvin Ramirez would benefit from rehab at [facility] to facilitate safe DC home . Recommend palliative care consult to help wife understand the trajectory of Parkinson's and help with goals of care and level of care that will be necessary in the future. Acute OT to follow.      Recommendations for follow up therapy are one component of a multi-disciplinary discharge planning process, led by the attending physician.  Recommendations may be updated based on patient status, additional functional criteria and insurance authorization.   Follow Up Recommendations  Acute inpatient rehab (3hours/day) (pending improvement in cognition)     Assistance Recommended at Discharge Frequent or constant Supervision/Assistance  Patient can return home with the following Two people to help with walking and/or transfers;Two people to help with bathing/dressing/bathroom;Direct supervision/assist for medications management;Direct supervision/assist for financial management;Assist for  transportation;Help with stairs or ramp for entrance;Assistance with feeding;Assistance with cooking/housework    Functional Status Assessment  Patient has had a recent decline in their functional status and demonstrates the ability to make significant improvements in function in a reasonable and predictable amount of time.  Equipment Recommendations  None recommended by OT    Recommendations for Other Services Rehab consult     Precautions / Restrictions Precautions Precautions: Fall Restrictions Weight Bearing Restrictions: No      Mobility Bed Mobility               General bed mobility comments: OOB on toilet on entry    Transfers Overall transfer level: Needs assistance Equipment used: 1 person hand held assist Transfers: Sit to/from Stand, Bed to chair/wheelchair/BSC Sit to Stand: Min assist     Step pivot transfers: Mod assist     General transfer comment: does not use a RW at [place] therefore due to his confused state did not introduce one; Able to rise with min A to steady - no assist to power up. Mod A to step and pivot using multipmodal cuing.      Balance Overall balance assessment: Needs assistance Sitting-balance support: Feet supported Sitting balance-Leahy Scale: Fair     Standing balance support: Bilateral upper extremity supported Standing balance-Leahy Scale: Poor                             ADL either performed or assessed with clinical judgement   ADL Overall ADL's : Needs assistance/impaired     Grooming: Wash/dry face;Wash/dry hands;Maximal assistance;Sitting;Cueing for sequencing - difficulty sustaining attention to task  Toilet Transfer: Moderate assistance;Stand-pivot             General ADL Comments: total A with other ADL tasks due to apparent confusion     Vision   Additional Comments: eyes closed 50% of session however would usually open on command; wife asking "why is he doing  that"     Perception     Praxis      Pertinent Vitals/Pain Pain Assessment Faces Pain Scale: No hurt Breathing: normal Negative Vocalization: none Facial Expression: smiling or inexpressive Body Language: tense, distressed pacing, fidgeting Consolability: distracted or reassured by voice/touch     Hand Dominance Right   Extremity/Trunk Assessment Upper Extremity Assessment Upper Extremity Assessment: Generalized weakness (Tremor RUE)   Lower Extremity Assessment Lower Extremity Assessment: Defer to PT evaluation   Cervical / Trunk Assessment Cervical / Trunk Assessment: Other exceptions Cervical / Trunk Exceptions: generalized trunk stiffness; h/o two back surgeries   Communication Communication Communication: Expressive difficulties (mumbling speech with low tone)   Cognition Arousal/Alertness: Lethargic, Awake/alert (eyes closed 50% of session) Behavior During Therapy: Restless Overall Cognitive Status: Difficult to assess Area of Impairment: Attention, Orientation, Following commands, Safety/judgement, Awareness, Problem solving                 Orientation Level: Disoriented to, Person, Place, Time, Situation Current Attention Level: Focused   Following Commands: Follows one step commands inconsistently, Follows one step commands with increased time Safety/Judgement: Decreased awareness of safety, Decreased awareness of deficits Awareness: Intellectual Problem Solving: Requires verbal cues, Requires tactile cues, Difficulty sequencing, Decreased initiation, Slow processing General Comments: wife usually supervises, but pt can perform BADL's currently limited command following with multimodal cues     General Comments  wife present    Exercises     Shoulder Instructions      Home Living Family/patient expects to be discharged to:: Private residence Living Arrangements: Spouse/significant other Available Help at Discharge: Family;Available 24  hours/day Type of Home: House Home Access: Stairs to enter [employer] of Steps: long short steps that could get a wheelchair up Entrance Stairs-Rails: None Home Layout: One level;Laundry or work area in basement     [employer]: Occupational psychologist: Handicapped height Bathroom Accessibility: Yes How Accessible: Accessible via [place]: Conservation officer, nature (2 wheels);Cane - single point          Prior Functioning/Environment Prior Level of Function : Needs assist             Mobility Comments: ambulates no devices normally per wife ADLs Comments: usually completes most BADL's with wife supervision or cues, but recently worsened mentation and wife reports pt toileted in closet; goes to boxing at [employer] [place]        OT Problem List: Decreased strength;Impaired balance (sitting and/or standing);Decreased activity tolerance;Decreased coordination;Decreased cognition;Decreased safety awareness      OT Treatment/Interventions: Self-care/ADL training;Therapeutic exercise;DME and/or AE instruction;Therapeutic activities;Cognitive remediation/compensation;Patient/family education;Balance training    OT Goals(Current goals can be found in the care plan section) Acute Rehab OT Goals Patient Stated Goal: wife's goal is for her husband to get better in order to DC home with her OT Goal Formulation: With family Time For Goal Achievement: 12/09/22 Potential to Achieve Goals: Good  OT Frequency: Min 2X/week    Co-evaluation              AM-PAC OT "6 Clicks" Daily Activity     Outcome Measure Help from another person eating meals?: Total Help  from another person taking care of personal grooming?: A Lot Help from another person toileting, which includes using toliet, bedpan, or urinal?: A Lot Help from another person bathing (including washing, rinsing, drying)?: Total Help from another person to put on and taking off regular upper  body clothing?: Total Help from another person to put on and taking off regular lower body clothing?: Total 6 Click Score: 8   End of Session Nurse Communication: Mobility status  Activity Tolerance: Patient tolerated treatment well Patient left: in chair;with call bell/phone within reach;with chair alarm set;with family/visitor present;with restraints reapplied (mittens applied)  OT Visit Diagnosis: Other abnormalities of gait and mobility (R26.89);Unsteadiness on feet (R26.81);Muscle weakness (generalized) (M62.81);History of falling (Z91.81);Other symptoms and signs involving cognitive function;Other symptoms and signs involving the nervous system (R29.898)                Time: 1610-9604 OT Time Calculation (min): 26 min Charges:  OT General Charges $OT Visit: 1 Visit OT Evaluation $OT Eval Moderate Complexity: 1 Mod OT Treatments $Self Care/Home Management : 8-22 mins  Maurie Boettcher, OT/L   Acute OT Clinical Specialist [place] Pager [phone] Office [phone]   [facility] 11/25/2022, 12:43 PM

## 2022-11-25 NOTE — Progress Notes (Signed)
Speech Language Pathology Treatment: Dysphagia  Patient Details Name: Calvin Ramirez MRN: [id number] DOB: February 12, 1941 Today's Date: 11/25/2022 Time: 1324-4010 SLP Time Calculation (min) (ACUTE ONLY): 22 min  Assessment / Plan / Recommendation Clinical Impression  Pt seen following PT session with pt up in chair and wife present. Pt continues with distractibility but less than yesterday according to SLP notes and able to be redirected for trials with applesauce, water and solid texture. Difficulty attending and following commands but accepted trials with what appeared to be timely swallows coordinated with respirations. Single, followed by multiple straw sips thin did not elicit signs of aspiration. He was able to masticate small piece of cracker without oral residue. Educated wife re: relationship between pt's current cognition and swallow safety and need for swallow precautions with recommendation of thin liquids (straw or cup), puree texture, meds whole in applesauce, limit distractions and verbalizations. ST will continue to follow. Wife very concerned with his "speech- I haven't been able to understand him for awhile." Explained/agreed re: need for ST and will give him a few days then request order for assessment when he may be better able to participate and follow instructions.    HPI HPI: Patient is an 82 y.o. male with PMH: Parkinson's disease, CAD s/p CABG, HTN, HLD, lewy body dementia. He presented to the ED on 11/23/2022 for evaluation of worsening mentations.   Covid +      SLP Plan  Continue with current plan of care      Recommendations for follow up therapy are one component of a multi-disciplinary discharge planning process, led by the attending physician.  Recommendations may be updated based on patient status, additional functional criteria and insurance authorization.    Recommendations  Diet recommendations: Dysphagia 1 (puree);Thin liquid Liquids provided via:  Cup;Straw Medication Administration: Whole meds with puree Supervision: Full supervision/cueing for compensatory strategies;Staff to assist with self feeding Compensations: Slow rate;Small sips/bites Postural Changes and/or Swallow Maneuvers: Seated upright 90 degrees                General recommendations: Rehab consult Oral Care Recommendations: Oral care BID Follow Up Recommendations: Acute inpatient rehab (3hours/day) Assistance recommended at discharge: Frequent or constant Supervision/Assistance SLP Visit Diagnosis: Dysphagia, unspecified (R13.10) Plan: Continue with current plan of care           Houston Siren  11/25/2022, 12:10 PM

## 2022-11-25 NOTE — Progress Notes (Signed)
PROGRESS NOTE    GAY RAPE  JOA:[id number] DOB: November 30, 1940 DOA: 11/19/2022 PCP: Shirline Frees, MD   Brief Narrative:  Calvin Ramirez is a 82 y.o. male with medical history significant of Parkinson's dementia, CAD status post CABG, HTN, HLD, question of PAF, brought in by family member for evaluation of worsening mentations and questionable unilateral weakness on the left.  Patient is confused, unable to provide any history, or history provided by wife at bedside.  At baseline patient is demented, patient is demented but able to communicate with wife.  3 days ago(12/28), patient started to complain about dry mouth loss of taste, and COVID test was positive and completed Molnupiravir the day of admission. Since yesterday, wife noticed patient has had episode of confusion and agitation with seemingly unsteady gait and sustained a fall yesterday no head injury and no LOC. This morning, patient was found to have new" shaking his whole body, and appears to have weakness on left side" which is new.    Assessment & Plan:   Principal Problem:   AMS (altered mental status) Active Problems:   Parkinson disease   Paroxysmal atrial fibrillation (HCC)   Acute encephalopathy  Acute metabolic encephalopathy on baseline parkinsons dementia Likely secondary to acute covid infection Rule out polypharmacy CVA ruled out -COVID is the most likely etiology of patient's confusion -Quarantine through 11-26-22 per CDC guidelines, patient was not hypoxic afebrile and is not admitted to the hospital for acute COVID infection/hypoxia/with normal chest x-ray and vital signs -Molnupiravir has known side effect of dizziness possibly accounting for fall but mental status changes are uncommon per package insert -No indication for treatment at this time, patient is without hypoxia normal chest x-ray, only complaints in the outpatient setting were lack of taste, dry mouth and questionable speech changes  although not uncommon given his baseline Parkinson's speech pattern per wife -IM Haldol x 1 in the ED -Mental status appears to be approaching baseline per wife at bedside today -N.p.o. for now and speech evaluation -advance diet as tolerated   PAF/paroxysmal a flutter -Sideline with cardiology Dr. Harl Bowie - rate controlled a flutter on EKG -Initiate Eliquis 5 mg twice daily -Currently remains rate controlled no indication for beta-blocker or calcium channel blocker -Follow-up outpatient with cardiology as scheduled   Unilateral weakness CVA ruled out -Physical exam is somewhat difficult to obtain given patient's baseline Parkinson's symptoms and difficulty following commands -MRI unremarkable for acute CVA -Continue PT OT as tolerated -wife requesting home health PT OT and speech if patient qualifies   Volume contraction/dehydration -Secondary to acute COVID infection and poor p.o. intake, IV fluids at intake completed   HTN -Borderline hypotension at intake, hold home medications -consider titrating or discontinuing as appropriate   Parkinson's dementia -Continue Sinemet  DVT prophylaxis: Eliquis Code Status: Full Family Communication: Wife at bedside  Status is: Observation  Dispo: The patient is from: Home              Anticipated d/c is to: Home              Anticipated d/c date is: 24 hours              Patient currently not medically stable for discharge, continues to await formal evaluation by physical therapy speech  Consultants:  None  Procedures:  None  Antimicrobials:  None indicated  Subjective: No acute issues or events overnight, mental status appears to be approaching baseline per wife at bedside.  Review of  systems markedly limited given patient's baseline dementia and speech difficulties  Objective: Vitals:   11/07/2022 2230 11/19/2022 2330 11/25/22 0200 11/25/22 0300  BP: 139/86 116/71 101/61 115/66  Pulse: 76 69 61 70  Resp: (!) 23 (!) 23 (!) 21 20   Temp:  97.8 F (36.6 C)  98.1 F (36.7 C)  TempSrc:  Oral  Oral  SpO2: 95% 95% 96% 96%    Intake/Output Summary (Last 24 hours) at 11/25/2022 0751 Last data filed at 11/25/2022 0500 Gross per 24 hour  Intake 1400 ml  Output --  Net 1400 ml   There were no vitals filed for this visit.  Examination:  General:  Pleasantly resting in bed, No acute distress.  Unable to assess orientation HEENT:  Normocephalic atraumatic.  Sclerae nonicteric, noninjected.  Extraocular movements intact bilaterally. Neck:  Without mass or deformity.  Trachea is midline. Lungs:  Clear to auscultate bilaterally without rhonchi, wheeze, or rales. Heart:  Regular rate and rhythm.  Without murmurs, rubs, or gallops. Abdomen:  Soft, nontender, nondistended.  Without guarding or rebound. Extremities: Bilateral upper extremity baseline tremor, left worse than right most notably at rest Skin:  Warm and dry, no erythema  Data Reviewed: I have personally reviewed following labs and imaging studies  CBC: Recent Labs  Lab 11/01/2022 1153 10/29/2022 1233  WBC 4.9  --   NEUTROABS 3.6  --   HGB 13.2 13.3  13.6  HCT 38.9* 39.0  40.0  MCV 91.7  --   PLT 148*  --    Basic Metabolic Panel: Recent Labs  Lab 10/27/2022 1153 11/22/2022 1233  NA 136 135  136  K 3.9 3.9  3.9  CL 102 102  CO2 22  --   GLUCOSE 102* 97  BUN 30* 29*  CREATININE 1.37* 1.30*  CALCIUM 8.9  --    GFR: CrCl cannot be calculated (Unknown ideal weight.). Liver Function Tests: Recent Labs  Lab 11/22/2022 1153  AST 27  ALT 21  ALKPHOS 57  BILITOT 1.0  PROT 6.5  ALBUMIN 3.6   No results for input(s): "LIPASE", "AMYLASE" in the last 168 hours. No results for input(s): "AMMONIA" in the last 168 hours. Coagulation Profile: No results for input(s): "INR", "PROTIME" in the last 168 hours. Cardiac Enzymes: No results for input(s): "CKTOTAL", "CKMB", "CKMBINDEX", "TROPONINI" in the last 168 hours. BNP (last 3 results) No results for  input(s): "PROBNP" in the last 8760 hours. HbA1C: No results for input(s): "HGBA1C" in the last 72 hours. CBG: Recent Labs  Lab 11/12/2022 1148  GLUCAP 101*   Lipid Profile: No results for input(s): "CHOL", "HDL", "LDLCALC", "TRIG", "CHOLHDL", "LDLDIRECT" in the last 72 hours. Thyroid Function Tests: No results for input(s): "TSH", "T4TOTAL", "FREET4", "T3FREE", "THYROIDAB" in the last 72 hours. Anemia Panel: No results for input(s): "VITAMINB12", "FOLATE", "FERRITIN", "TIBC", "IRON", "RETICCTPCT" in the last 72 hours. Sepsis Labs: Recent Labs  Lab 11/07/2022 1153 10/28/2022 1305  LATICACIDVEN 0.9 1.8    Recent Results (from the past 240 hour(s))  Resp panel by RT-PCR (RSV, Flu A&B, Covid) Anterior Nasal Swab     Status: Abnormal   Collection Time: 11/06/2022 11:53 AM   Specimen: Anterior Nasal Swab  Result Value Ref Range Status   SARS Coronavirus 2 by RT PCR POSITIVE (A) NEGATIVE Final    Comment: (NOTE) SARS-CoV-2 target nucleic acids are DETECTED.  The SARS-CoV-2 RNA is generally detectable in upper respiratory specimens during the acute phase of infection. Positive results are indicative of the  presence of the identified virus, but do not rule out bacterial infection or co-infection with other pathogens not detected by the test. Clinical correlation with patient history and other diagnostic information is necessary to determine patient infection status. The expected result is Negative.  Fact Sheet for Patients: EntrepreneurPulse.com.au  Fact Sheet for Healthcare Providers: IncredibleEmployment.be  This test is not yet approved or cleared by the [place] FDA and  has been authorized for detection and/or diagnosis of SARS-CoV-2 by FDA under an Emergency Use Authorization (EUA).  This EUA will remain in effect (meaning this test can be used) for the duration of  the COVID-19 declaration under Section 564(b)(1) of the A ct, 21 U.S.C.  section 360bbb-3(b)(1), unless the authorization is terminated or revoked sooner.     Influenza A by PCR NEGATIVE NEGATIVE Final   Influenza B by PCR NEGATIVE NEGATIVE Final    Comment: (NOTE) The Xpert Xpress SARS-CoV-2/FLU/RSV plus assay is intended as an aid in the diagnosis of influenza from Nasopharyngeal swab specimens and should not be used as a sole basis for treatment. Nasal washings and aspirates are unacceptable for Xpert Xpress SARS-CoV-2/FLU/RSV testing.  Fact Sheet for Patients: EntrepreneurPulse.com.au  Fact Sheet for Healthcare Providers: IncredibleEmployment.be  This test is not yet approved or cleared by the [place] FDA and has been authorized for detection and/or diagnosis of SARS-CoV-2 by FDA under an Emergency Use Authorization (EUA). This EUA will remain in effect (meaning this test can be used) for the duration of the COVID-19 declaration under Section 564(b)(1) of the Act, 21 U.S.C. section 360bbb-3(b)(1), unless the authorization is terminated or revoked.     Resp Syncytial Virus by PCR NEGATIVE NEGATIVE Final    Comment: (NOTE) Fact Sheet for Patients: EntrepreneurPulse.com.au  Fact Sheet for Healthcare Providers: IncredibleEmployment.be  This test is not yet approved or cleared by the [place] FDA and has been authorized for detection and/or diagnosis of SARS-CoV-2 by FDA under an Emergency Use Authorization (EUA). This EUA will remain in effect (meaning this test can be used) for the duration of the COVID-19 declaration under Section 564(b)(1) of the Act, 21 U.S.C. section 360bbb-3(b)(1), unless the authorization is terminated or revoked.  Performed at [facility], [place] [address], [place], [place] [address]   Culture, blood (routine x 2)     Status: None (Preliminary result)   Collection Time: 10/31/2022 11:53 AM   Specimen: BLOOD RIGHT FOREARM  Result  Value Ref Range Status   Specimen Description BLOOD RIGHT FOREARM  Final   Special Requests   Final    BOTTLES DRAWN AEROBIC AND ANAEROBIC Blood Culture results may not be optimal due to an excessive volume of blood received in culture bottles   Culture   Final    NO GROWTH < 24 HOURS Performed at [facility], [place] [address] [place], [place] [address]    Report Status PENDING  Incomplete  Culture, blood (routine x 2)     Status: None (Preliminary result)   Collection Time: 10/28/2022 12:08 PM   Specimen: BLOOD  Result Value Ref Range Status   Specimen Description BLOOD RIGHT ANTECUBITAL  Final   Special Requests   Final    BOTTLES DRAWN AEROBIC AND ANAEROBIC Blood Culture results may not be optimal due to an excessive volume of blood received in culture bottles   Culture   Final    NO GROWTH < 24 HOURS Performed at [facility], [place] [address], [place], [place]  [address]    Report Status PENDING  Incomplete         Radiology Studies: CT Head Wo Contrast  Result Date: 11/21/2022 CLINICAL DATA:  Mental status change. Unknown cause. Recently diagnosed with COVID. Found by way urinating in the closet. History of dementia. EXAM: CT HEAD WITHOUT CONTRAST TECHNIQUE: Contiguous axial images were obtained from the base of the skull through the vertex without intravenous contrast. RADIATION DOSE REDUCTION: This exam was performed according to the departmental dose-optimization program which includes automated exposure control, adjustment of the mA and/or kV according to patient size and/or use of iterative reconstruction technique. COMPARISON:  CT brain 05/06/2022 FINDINGS: Brain: There is mild-to-moderate cortical atrophy, unchanged from prior and within normal limits for patient age. The ventricles are normal in configuration. The basilar cisterns are patent. No mass, mass effect, or midline shift. No acute intracranial hemorrhage is seen. No abnormal extra-axial fluid collection.  Mild patchy periventricular white matter hypodensities are similar to prior, most likely chronic ischemic white matter changes. Otherwise, preservation of the normal cortical gray-white interface without CT evidence of an acute major vascular territorial cortical based infarction. Vascular: No hyperdense vessel or unexpected calcification. Skull: Normal. Negative for fracture or focal lesion. Sinuses/Orbits: Status post bilateral ocular lens replacements. The frontal sinuses are again developmentally non aerated. Minimal peripheral mucosal thickening of the ethmoid air cells and left maxillary sinus, new from 04/29/2022. New small layering air-fluid level within right maxillary sinus. The mastoid air cells are clear. Other: None. IMPRESSION: 1. No acute intracranial process. 2. Mild-to-moderate cortical atrophy and mild chronic ischemic white matter changes, similar to prior. 3. New small layering air-fluid level within the right maxillary sinus, nonspecific but can be seen with acute sinusitis in the appropriate clinical setting. Electronically Signed   By: Yvonne Kendall M.D.   On: 11/05/2022 11:50   DG Chest Port 1 View  Result Date: 10/28/2022 CLINICAL DATA:  Shortness of breath EXAM: PORTABLE CHEST 1 VIEW COMPARISON:  05/06/2022 FINDINGS: Stable cardiomegaly status post sternotomy and CABG. Aortic atherosclerosis. No focal airspace consolidation, pleural effusion, or pneumothorax. IMPRESSION: No active disease. Electronically Signed   By: Davina Poke D.O.   On: 10/25/2022 11:21    Scheduled Meds:  apixaban  5 mg Oral BID   aspirin EC  81 mg Oral Daily   Carbidopa-Levodopa ER  1 tablet Oral BID   donepezil  10 mg Oral QHS   rosuvastatin  20 mg Oral Daily   traZODone  100 mg Oral QHS   Continuous Infusions:  sodium chloride 100 mL/hr at 11/25/22 0100    LOS: 0 days   Time spent: 42mn  Saxton C Sahory Nordling, DO [facility]  If 7PM-7AM, please contact  night-coverage [email or web address]  11/25/2022, 7:51 AM

## 2022-11-25 NOTE — Evaluation (Signed)
Physical Therapy Evaluation Patient Details Name: Calvin Ramirez MRN: [id number] DOB: February 19, 1941 Today's Date: 11/25/2022  History of Present Illness  Patient is an 82 y.o. male with PMH: Parkinson's disease, CAD s/p CABG, HTN, HLD, lewy body dementia. He presented to the ED on 11/12/2022 for evaluation of worsening mentations and fall at home.   Covid +  Clinical Impression  Patient presents with decreased mobility due to generalized weakness, decreased balance, decreased activity tolerance, decreased cognition with poor safety and deficit awareness.  He is a high fall risk and wife reports cannot help physically much.  Currently mod to max A for up to recliner with RW.  Previously was able to perform most BADL's with wife supervision and cueing and was able to ambulate without devices.  Patient likely a good candidate for acute inpatient rehab, though wife has some concerns given his lack of effort at home and his overall personality of "not being a fighter" though she does want him home eventually.  PT will continue to follow and update recommendations as appropriate.        Recommendations for follow up therapy are one component of a multi-disciplinary discharge planning process, led by the attending physician.  Recommendations may be updated based on patient status, additional functional criteria and insurance authorization.  Follow Up Recommendations Acute inpatient rehab (3hours/day)      Assistance Recommended at Discharge Frequent or constant Supervision/Assistance  Patient can return home with the following  A lot of help with bathing/dressing/bathroom;Assistance with cooking/housework;Direct supervision/assist for medications management;Assist for transportation;A lot of help with walking and/or transfers;Help with stairs or ramp for entrance    Equipment Recommendations Wheelchair (measurements PT)  Recommendations for Other Services  Rehab consult    Functional Status  Assessment Patient has had a recent decline in their functional status and demonstrates the ability to make significant improvements in function in a reasonable and predictable amount of time.     Precautions / Restrictions Precautions Precautions: Fall      Mobility  Bed Mobility Overal bed mobility: Needs Assistance Bed Mobility: Rolling, Sidelying to Sit Rolling: Max assist Sidelying to sit: Mod assist, HOB elevated       General bed mobility comments: assisted legs off bed and pt pulled up with HHA    Transfers Overall transfer level: Needs assistance Equipment used: Rolling walker (2 wheels) Transfers: Sit to/from Stand, Bed to chair/wheelchair/BSC Sit to Stand: Mod assist   Step pivot transfers: Mod assist       General transfer comment: rising somewhat impulsively prior to room set up, then with RW pulled up with some lifting help; stepping to recliner with RW and mod A for balance and walker management    Ambulation/Gait                  Stairs            Wheelchair Mobility    Modified Rankin (Stroke Patients Only)       Balance Overall balance assessment: Needs assistance Sitting-balance support: Feet supported Sitting balance-Leahy Scale: Poor Sitting balance - Comments: posterior LOB if left unaided, min to minguard A at EOB with cues for focus on keeping anterior weight shift Postural control: Posterior lean Standing balance support: Bilateral upper extremity supported Standing balance-Leahy Scale: Poor Standing balance comment: mod A for balance with RW  Pertinent Vitals/Pain Pain Assessment Pain Assessment: PAINAD Breathing: normal Negative Vocalization: none Facial Expression: smiling or inexpressive Body Language: tense, distressed pacing, fidgeting Consolability: no need to console PAINAD Score: 1 Pain Intervention(s): Monitored during session, Repositioned    Home Living  Family/patient expects to be discharged to:: Private residence Living Arrangements: Spouse/significant other Available Help at Discharge: Family;Available 24 hours/day Type of Home: House Home Access: Stairs to enter Entrance Stairs-Rails: None Entrance Stairs-Number of Steps: long short steps that could get a wheelchair up   Home Layout: One level;Laundry or work area in [place]: Conservation officer, nature (2 wheels);Cane - single point      Prior Function Prior Level of Function : Needs assist             Mobility Comments: ambulates no devices normally per wife ADLs Comments: usually completes most BADL's with wife supervision or cues, but recently worsened mentation and wife reports pt toileted in closet     Hand Dominance        Extremity/Trunk Assessment   Upper Extremity Assessment Upper Extremity Assessment: Generalized weakness    Lower Extremity Assessment Lower Extremity Assessment: Generalized weakness    Cervical / Trunk Assessment Cervical / Trunk Assessment: Other exceptions Cervical / Trunk Exceptions: generalized trunk stiffness; h/o two back surgeries  Communication   Communication: Expressive difficulties (mumbling speech with low tone)  Cognition Arousal/Alertness: Awake/alert Behavior During Therapy: Restless Overall Cognitive Status: Impaired/Different from baseline Area of Impairment: Attention, Orientation, Following commands, Safety/judgement                 Orientation Level: Disoriented to, Person, Place, Time, Situation Current Attention Level: Focused   Following Commands: Follows one step commands inconsistently, Follows one step commands with increased time Safety/Judgement: Decreased awareness of safety, Decreased awareness of deficits     General Comments: wife usually supervises, but pt can perform BADL's currently limited command following with multimodal cues        General Comments General comments (skin  integrity, edema, etc.): wife present and reports he is not a Management consultant" had no siblings and never participated in sports.  States she has a hard time motivating him at home to do much.  She does take him to boxing and he seems to like it, but she has been trying to get him to work on speech counting during car trips, singing and shouting in shower to no avail.  Discussed rehab options.    Exercises     Assessment/Plan    PT Assessment Patient needs continued PT services  PT Problem List Decreased strength;Decreased balance;Decreased cognition;Decreased activity tolerance;Decreased knowledge of use of DME;Decreased mobility       PT Treatment Interventions DME instruction;Functional mobility training;Balance training;Patient/family education;Therapeutic activities;Gait training;Neuromuscular re-education    PT Goals (Current goals can be found in the Care Plan section)  Acute Rehab PT Goals Patient Stated Goal: to get rehab prior to home PT Goal Formulation: With patient/family Time For Goal Achievement: 12/09/22 Potential to Achieve Goals: Good    Frequency Min 3X/week     Co-evaluation               AM-PAC PT "6 Clicks" Mobility  Outcome Measure Help needed turning from your back to your side while in a flat bed without using bedrails?: Total Help needed moving from lying on your back to sitting on the side of a flat bed without using bedrails?: Total Help needed moving to and from a bed to a chair (including  a wheelchair)?: A Lot Help needed standing up from a chair using your arms (e.g., wheelchair or bedside chair)?: A Lot Help needed to walk in hospital room?: Total   6 Click Score: 7    End of Session Equipment Utilized During Treatment: Gait belt Activity Tolerance: Patient limited by fatigue Patient left: in chair;with chair alarm set;with family/visitor present Nurse Communication: Mobility status PT Visit Diagnosis: Other abnormalities of gait and mobility  (R26.89);Muscle weakness (generalized) (M62.81);Other symptoms and signs involving the nervous system (R29.898)    Time: 1610-9604 PT Time Calculation (min) (ACUTE ONLY): 38 min   Charges:   PT Evaluation $PT Eval Moderate Complexity: 1 Mod PT Treatments $Therapeutic Activity: 8-22 mins $Self Care/Home Management: 8-22        Magda Kiel, PT Acute Rehabilitation Services [phone] 11/25/2022   Reginia Naas 11/25/2022, 11:21 AM

## 2022-11-25 DEATH — deceased

## 2022-11-26 ENCOUNTER — Other Ambulatory Visit (HOSPITAL_COMMUNITY): Payer: Self-pay

## 2022-11-26 DIAGNOSIS — I10 Essential (primary) hypertension: Secondary | ICD-10-CM | POA: Diagnosis present

## 2022-11-26 DIAGNOSIS — G20A1 Parkinson's disease without dyskinesia, without mention of fluctuations: Secondary | ICD-10-CM | POA: Diagnosis present

## 2022-11-26 DIAGNOSIS — R509 Fever, unspecified: Secondary | ICD-10-CM | POA: Diagnosis not present

## 2022-11-26 DIAGNOSIS — I723 Aneurysm of iliac artery: Secondary | ICD-10-CM | POA: Diagnosis present

## 2022-11-26 DIAGNOSIS — I480 Paroxysmal atrial fibrillation: Secondary | ICD-10-CM | POA: Diagnosis present

## 2022-11-26 DIAGNOSIS — Z66 Do not resuscitate: Secondary | ICD-10-CM | POA: Diagnosis present

## 2022-11-26 DIAGNOSIS — R410 Disorientation, unspecified: Secondary | ICD-10-CM | POA: Diagnosis not present

## 2022-11-26 DIAGNOSIS — E785 Hyperlipidemia, unspecified: Secondary | ICD-10-CM | POA: Diagnosis present

## 2022-11-26 DIAGNOSIS — I4892 Unspecified atrial flutter: Secondary | ICD-10-CM | POA: Diagnosis present

## 2022-11-26 DIAGNOSIS — Z880 Allergy status to penicillin: Secondary | ICD-10-CM | POA: Diagnosis not present

## 2022-11-26 DIAGNOSIS — Z515 Encounter for palliative care: Secondary | ICD-10-CM | POA: Diagnosis not present

## 2022-11-26 DIAGNOSIS — G8194 Hemiplegia, unspecified affecting left nondominant side: Secondary | ICD-10-CM | POA: Diagnosis present

## 2022-11-26 DIAGNOSIS — I2510 Atherosclerotic heart disease of native coronary artery without angina pectoris: Secondary | ICD-10-CM | POA: Diagnosis present

## 2022-11-26 DIAGNOSIS — Z951 Presence of aortocoronary bypass graft: Secondary | ICD-10-CM | POA: Diagnosis not present

## 2022-11-26 DIAGNOSIS — R2681 Unsteadiness on feet: Secondary | ICD-10-CM | POA: Diagnosis present

## 2022-11-26 DIAGNOSIS — Z9181 History of falling: Secondary | ICD-10-CM | POA: Diagnosis not present

## 2022-11-26 DIAGNOSIS — R918 Other nonspecific abnormal finding of lung field: Secondary | ICD-10-CM | POA: Diagnosis not present

## 2022-11-26 DIAGNOSIS — J690 Pneumonitis due to inhalation of food and vomit: Secondary | ICD-10-CM | POA: Diagnosis not present

## 2022-11-26 DIAGNOSIS — E860 Dehydration: Secondary | ICD-10-CM | POA: Diagnosis present

## 2022-11-26 DIAGNOSIS — F0280 Dementia in other diseases classified elsewhere without behavioral disturbance: Secondary | ICD-10-CM | POA: Diagnosis present

## 2022-11-26 DIAGNOSIS — R4182 Altered mental status, unspecified: Secondary | ICD-10-CM | POA: Diagnosis present

## 2022-11-26 DIAGNOSIS — G9349 Other encephalopathy: Secondary | ICD-10-CM | POA: Diagnosis not present

## 2022-11-26 DIAGNOSIS — N179 Acute kidney failure, unspecified: Secondary | ICD-10-CM | POA: Diagnosis not present

## 2022-11-26 DIAGNOSIS — U071 COVID-19: Secondary | ICD-10-CM | POA: Diagnosis present

## 2022-11-26 DIAGNOSIS — I5032 Chronic diastolic (congestive) heart failure: Secondary | ICD-10-CM | POA: Diagnosis present

## 2022-11-26 DIAGNOSIS — G9341 Metabolic encephalopathy: Secondary | ICD-10-CM | POA: Diagnosis present

## 2022-11-26 DIAGNOSIS — Z8249 Family history of ischemic heart disease and other diseases of the circulatory system: Secondary | ICD-10-CM | POA: Diagnosis not present

## 2022-11-26 DIAGNOSIS — G9340 Encephalopathy, unspecified: Secondary | ICD-10-CM | POA: Diagnosis not present

## 2022-11-26 DIAGNOSIS — I959 Hypotension, unspecified: Secondary | ICD-10-CM | POA: Diagnosis present

## 2022-11-26 DIAGNOSIS — Z955 Presence of coronary angioplasty implant and graft: Secondary | ICD-10-CM | POA: Diagnosis not present

## 2022-11-26 LAB — URINE CULTURE: Culture: NO GROWTH

## 2022-11-26 LAB — URINALYSIS, ROUTINE W REFLEX MICROSCOPIC
Bilirubin Urine: NEGATIVE
Glucose, UA: NEGATIVE mg/dL
Hgb urine dipstick: NEGATIVE
Ketones, ur: 5 mg/dL — AB
Leukocytes,Ua: NEGATIVE
Nitrite: NEGATIVE
Protein, ur: 100 mg/dL — AB
Specific Gravity, Urine: 1.027 (ref 1.005–1.030)
pH: 5 (ref 5.0–8.0)

## 2022-11-26 MED ORDER — HALOPERIDOL LACTATE 5 MG/ML IJ SOLN: 1.0000 mg | Freq: Four times a day (QID) | INTRAMUSCULAR | Status: DC | PRN

## 2022-11-26 MED ORDER — SODIUM CHLORIDE 0.9 % IV SOLN: INTRAVENOUS | Status: DC

## 2022-11-26 MED ORDER — SODIUM CHLORIDE 0.9 % IV BOLUS
500.0000 mL | Freq: Once | INTRAVENOUS | Status: AC
  Administered 2022-11-26: 500 mL via INTRAVENOUS

## 2022-11-26 NOTE — Progress Notes (Signed)
Physical Therapy Treatment Patient Details Name: Calvin Ramirez MRN: [id number] DOB: October 30, 1941 Today's Date: 11/26/2022   History of Present Illness Patient is an 82 y.o. male with PMH: Parkinson's disease, CAD s/p CABG, HTN, HLD, lewy body dementia. He presented to the ED on 11/18/2022 for evaluation of worsening mentations and fall at home.   Covid +    PT Comments    MD requested PT session today to see if pt could discharge home instead of AIR. Patient very lethargic today. Briefly opened eyes and attempted to speak,  however was very garbled. Immediately closes eyes and could not arouse him to open his eyes again. Rolled pt on his left side and he still did not arouse. Wife present and very upset that he was given something to sedate him yesterday afternoon. States it takes him a long time to clear anesthesia and feels it is the same for meds given yesterday. Will reattempt in p.m. if pt more alert and schedule permits.   Recommendations for follow up therapy are one component of a multi-disciplinary discharge planning process, led by the attending physician.  Recommendations may be updated based on patient status, additional functional criteria and insurance authorization.  Follow Up Recommendations  Acute inpatient rehab (3hours/day)     Assistance Recommended at Discharge Frequent or constant Supervision/Assistance  Patient can return home with the following A lot of help with bathing/dressing/bathroom;Assistance with cooking/housework;Direct supervision/assist for medications management;Assist for transportation;Help with stairs or ramp for entrance;Two people to help with walking and/or transfers   Equipment Recommendations  Wheelchair (measurements PT)    Recommendations for Other Services       Precautions / Restrictions Precautions Precautions: Fall Restrictions Weight Bearing Restrictions: No     Mobility  Bed Mobility Overal bed mobility: Needs Assistance Bed  Mobility: Rolling Rolling: Total assist         General bed mobility comments: rolling did not increase his arousal    Transfers                   General transfer comment: unable    Ambulation/Gait               General Gait Details: unable   Stairs             Wheelchair Mobility    Modified Rankin (Stroke Patients Only)       Balance                                            Cognition Arousal/Alertness: Lethargic Behavior During Therapy: Restless Overall Cognitive Status: Difficult to assess                                 General Comments: pt briefly opened eyes x1 to his name; however would not repeat and did not awaken even with sternal rub        Exercises      General Comments General comments (skin integrity, edema, etc.): Wife present and very upset that he is "so out of it." She reports he was better yesterday, but even then was sleeping alot.      Pertinent Vitals/Pain Pain Assessment Pain Assessment: Faces Faces Pain Scale: No hurt    Home Living  Prior Function            PT Goals (current goals can now be found in the care plan section) Acute Rehab PT Goals Patient Stated Goal: to get rehab prior to home Time For Goal Achievement: 12/09/22 Potential to Achieve Goals: Good Progress towards PT goals: Not progressing toward goals - comment    Frequency    Min 3X/week      PT Plan Current plan remains appropriate (assuming cognition improves)    Co-evaluation              AM-PAC PT "6 Clicks" Mobility   Outcome Measure  Help needed turning from your back to your side while in a flat bed without using bedrails?: Total Help needed moving from lying on your back to sitting on the side of a flat bed without using bedrails?: Total Help needed moving to and from a bed to a chair (including a wheelchair)?: Total Help needed standing up  from a chair using your arms (e.g., wheelchair or bedside chair)?: Total Help needed to walk in hospital room?: Total Help needed climbing 3-5 steps with a railing? : Total 6 Click Score: 6    End of Session   Activity Tolerance: Patient limited by lethargy Patient left: with family/visitor present;in bed;with call bell/phone within reach;with bed alarm set Nurse Communication: Mobility status;Other (comment) (decr arousal) PT Visit Diagnosis: Other abnormalities of gait and mobility (R26.89);Muscle weakness (generalized) (M62.81);Other symptoms and signs involving the nervous system (R29.898)     Time: 1448-1856 PT Time Calculation (min) (ACUTE ONLY): 19 min  Charges:  $Therapeutic Activity: 8-22 mins                      Arby Barrette, PT Acute Rehabilitation Services  Office [phone]    Rexanne Mano 11/26/2022, 11:22 AM

## 2022-11-26 NOTE — TOC Initial Note (Signed)
Transition of Care [facility]) - Initial/Assessment Note    Patient Details  Name: Calvin Ramirez MRN: [id number] Date of Birth: 05-20-1941  [facility] CM/SW Contact:    Pollie Friar, RN Phone Number: 11/26/2022, 2:40 PM  Clinical Narrative:                 Pt is from home with spouse. Pt has changed to inpatient so await CIR eval.  TOC following.  Expected Discharge Plan: IP Rehab Facility Barriers to Discharge: Continued Medical Work up   Patient Goals and CMS Choice   CMS Medicare.gov Compare Post Acute Care list provided to:: Patient Represenative (must comment) Choice offered to / list presented to : Spouse      Expected Discharge Plan and Services     Post Acute Care Choice: IP Rehab Living arrangements for the past 2 months: Single Family Home                                      Prior Living Arrangements/Services Living arrangements for the past 2 months: Single Family Home Lives with:: Spouse                   Activities of Daily Living Home Assistive Devices/Equipment: Blood pressure cuff ADL Screening (condition at time of admission) Patient's cognitive ability adequate to safely complete daily activities?: No Is the patient deaf or have difficulty hearing?: No Does the patient have difficulty seeing, even when wearing glasses/contacts?: No Does the patient have difficulty concentrating, remembering, or making decisions?: Yes Patient able to express need for assistance with ADLs?: No Does the patient have difficulty dressing or bathing?: Yes Independently performs ADLs?: No Communication: Needs assistance Is this a change from baseline?: Change from baseline, expected to last >3 days Dressing (OT): Needs assistance Is this a change from baseline?: Change from baseline, expected to last <3days Grooming: Needs assistance Is this a change from baseline?: Change from baseline, expected to last <3 days Feeding: Needs  assistance Is this a change from baseline?: Change from baseline, expected to last <3 days Bathing: Needs assistance Is this a change from baseline?: Change from baseline, expected to last <3 days Toileting: Needs assistance Is this a change from baseline?: Change from baseline, expected to last <3 days In/Out Bed: Needs assistance Is this a change from baseline?: Change from baseline, expected to last <3 days Walks in Home: Needs assistance Is this a change from baseline?: Change from baseline, expected to last <3 days Does the patient have difficulty walking or climbing stairs?: Yes Weakness of Legs: Both Weakness of Arms/Hands: Right  Permission Sought/Granted                  Emotional Assessment              Admission diagnosis:  Altered mental status, unspecified altered mental status type [R41.82] AMS (altered mental status) [R41.82] Patient Active Problem List   Diagnosis Date Noted   Acute encephalopathy 11/14/2022   AMS (altered mental status) 11/14/2022   Paroxysmal atrial fibrillation ([place]) 03/17/2018   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   CAD (coronary artery disease) 02/24/2018   Dementia ([place])    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 [place] [address] [place] [address] Phone: [phone] Fax: [phone]     Social Determinants of Health (SDOH) Social History: [place]: No Food Insecurity (11/25/2022)  Housing: Low  Risk  (11/25/2022)  Transportation Needs: No Transportation Needs (11/25/2022)  Utilities: Not At Risk (11/25/2022)  Tobacco Use: Low Risk  (11/05/2022)   SDOH Interventions:     Readmission Risk Interventions     No data to display

## 2022-11-26 NOTE — TOC Benefit Eligibility Note (Signed)
Patient Teacher, English as a foreign language completed.    The patient is currently admitted and upon discharge could be taking Eliquis 5 mg.  The current 30 day co-pay is $47.00.   The patient is insured through [place] Patient Advocate Specialist [place] Patient Advocate Team Direct Number: [phone]  Fax: [phone]

## 2022-11-26 NOTE — Progress Notes (Signed)
PROGRESS NOTE    Calvin Ramirez  YNW:[id number] DOB: 21-Feb-1941 DOA: 11/04/2022 PCP: Shirline Frees, MD   Brief Narrative:  Calvin Ramirez is a 82 y.o. male with medical history significant of Parkinson's dementia, CAD status post CABG, HTN, HLD, question of PAF, brought in by family member for evaluation of worsening mentations and questionable unilateral weakness on the left as well as worsening confusion from baseline dementia for approximately 5 days.  Typically at baseline is able to ambulate restroom by himself and is verbal but somewhat difficult to understand.  3 days prior to admission patient was evaluated by PCP with positive COVID swab and started on molnupiravir(12/28) -with worsening mental status and ultimately presented to the hospital 12/31.  Patient's mental status continues to wax and wane, somewhat more lethargic and poorly responsive today than at admission.  COVID infection unclear if acute versus subacute should be clearing, off precautions as of 11/27/2022.  Blood cultures and UA collected to ensure no concurrent bacterial infection underlying -unremarkable thus far.  Assessment & Plan:   Principal Problem:   AMS (altered mental status) Active Problems:   Parkinson disease   Paroxysmal atrial fibrillation (HCC)   Acute encephalopathy  Acute metabolic encephalopathy on baseline parkinsons dementia Likely secondary to acute covid infection Rule out polypharmacy CVA ruled out -COVID is the most likely etiology of patient's confusion -Quarantine to discontinue on 11/27/2022 per CDC guidelines.  Patient remains afebrile, without leukocytosis, negative chest x-ray. -Molnupiravir has known side effect of dizziness possibly accounting for fall but mental status changes are uncommon per package insert. -Patient completed molnupiravir prior to admission, no further indication for retesting or treatment at this time. -Discontinue benzodiazepines, patient does not  tolerate "sedation" well per wife at bedside -Haldol dose reduced to 1 mg -use sparingly only when absolutely necessary. -Baseline Parkinson's, ambulates independently, able to restroom by himself, speech is somewhat unintelligible at baseline, but otherwise requires assistance with ADLs. -Advance diet as appropriate   PAF/paroxysmal a flutter -Sideline with cardiology Dr. Harl Bowie - rate controlled a flutter on EKG -Initiate Eliquis 5 mg twice daily (new as of this hospitalization) -Currently remains rate controlled no indication for beta-blocker or calcium channel blocker -Follow-up outpatient with cardiology as scheduled   Unilateral weakness, resolved CVA ruled out -Physical exam is somewhat difficult to obtain given patient's baseline Parkinson's symptoms and difficulty following commands but moving all limbs with equal strength. -MRI unremarkable for acute CVA -Continue PT OT as tolerated - CIR recommended   Volume contraction/dehydration -Secondary to acute COVID infection and poor p.o. intake, IV fluids at intake completed   HTN -Currently hypotensive off home meds - initiate 500cc bolus - repeat vitals WNL.   Parkinson's dementia -Continue Sinemet  DVT prophylaxis: Eliquis Code Status: Full Family Communication: Wife at bedside  Status is: Observation  Dispo: The patient is from: Home              Anticipated d/c is to: CIR per PT/OT recs              Anticipated d/c date is: 24-48 hours              Patient currently not medically stable for discharge  Consultants:  None  Procedures:  None  Antimicrobials:  None indicated  Subjective: No acute issues or events overnight, mental status remains depressed from baseline.  Review of systems markedly limited given patient's baseline dementia and speech difficulties  Objective: Vitals:   11/25/22 2125 11/26/22 0000  11/26/22 0123 11/26/22 0400  BP: (!) 123/110 118/70 (!) 139/108 (!) 89/71  Pulse: 85 82 98 74   Resp: (!) 25 20 (!) 24 20  Temp: 99.5 F (37.5 C) 100 F (37.8 C) 100 F (37.8 C) 99.6 F (37.6 C)  TempSrc: Axillary Axillary Axillary Axillary  SpO2: 94% 95% 94% 93%  Weight:      Height:        Intake/Output Summary (Last 24 hours) at 11/26/2022 0810 Last data filed at 11/25/2022 1754 Gross per 24 hour  Intake 1758.15 ml  Output --  Net 1758.15 ml    Filed Weights   11/25/22 1526  Weight: 79.4 kg    Examination:  General:  Pleasantly resting in bed, No acute distress.  Unable to assess orientation HEENT:  Normocephalic atraumatic.  Sclerae nonicteric, noninjected.  Extraocular movements intact bilaterally. Neck:  Without mass or deformity.  Trachea is midline. Lungs:  Clear to auscultate bilaterally without rhonchi, wheeze, or rales. Heart:  Regular rate and rhythm.  Without murmurs, rubs, or gallops. Abdomen:  Soft, nontender, nondistended.  Without guarding or rebound. Extremities: Bilateral upper extremity baseline tremor, left worse than right most notably at rest Skin:  Warm and dry, no erythema  Data Reviewed: I have personally reviewed following labs and imaging studies  CBC: Recent Labs  Lab 11/21/2022 1153 11/16/2022 1233  WBC 4.9  --   NEUTROABS 3.6  --   HGB 13.2 13.3  13.6  HCT 38.9* 39.0  40.0  MCV 91.7  --   PLT 148*  --     Basic Metabolic Panel: Recent Labs  Lab 11/23/2022 1153 11/21/2022 1233  NA 136 135  136  K 3.9 3.9  3.9  CL 102 102  CO2 22  --   GLUCOSE 102* 97  BUN 30* 29*  CREATININE 1.37* 1.30*  CALCIUM 8.9  --     GFR: Estimated Creatinine Clearance: 46.7 mL/min (A) (by C-G formula based on SCr of 1.3 mg/dL (H)). Liver Function Tests: Recent Labs  Lab 11/23/2022 1153  AST 27  ALT 21  ALKPHOS 57  BILITOT 1.0  PROT 6.5  ALBUMIN 3.6    No results for input(s): "LIPASE", "AMYLASE" in the last 168 hours. No results for input(s): "AMMONIA" in the last 168 hours. Coagulation Profile: No results for input(s): "INR",  "PROTIME" in the last 168 hours. Cardiac Enzymes: No results for input(s): "CKTOTAL", "CKMB", "CKMBINDEX", "TROPONINI" in the last 168 hours. BNP (last 3 results) No results for input(s): "PROBNP" in the last 8760 hours. HbA1C: No results for input(s): "HGBA1C" in the last 72 hours. CBG: Recent Labs  Lab 11/16/2022 1148  GLUCAP 101*    Lipid Profile: No results for input(s): "CHOL", "HDL", "LDLCALC", "TRIG", "CHOLHDL", "LDLDIRECT" in the last 72 hours. Thyroid Function Tests: No results for input(s): "TSH", "T4TOTAL", "FREET4", "T3FREE", "THYROIDAB" in the last 72 hours. Anemia Panel: No results for input(s): "VITAMINB12", "FOLATE", "FERRITIN", "TIBC", "IRON", "RETICCTPCT" in the last 72 hours. Sepsis Labs: Recent Labs  Lab 11/22/2022 1153 11/20/2022 1305  LATICACIDVEN 0.9 1.8     Recent Results (from the past 240 hour(s))  Resp panel by RT-PCR (RSV, Flu A&B, Covid) Anterior Nasal Swab     Status: Abnormal   Collection Time: 10/25/2022 11:53 AM   Specimen: Anterior Nasal Swab  Result Value Ref Range Status   SARS Coronavirus 2 by RT PCR POSITIVE (A) NEGATIVE Final    Comment: (NOTE) SARS-CoV-2 target nucleic acids are DETECTED.  The SARS-CoV-2  RNA is generally detectable in upper respiratory specimens during the acute phase of infection. Positive results are indicative of the presence of the identified virus, but do not rule out bacterial infection or co-infection with other pathogens not detected by the test. Clinical correlation with patient history and other diagnostic information is necessary to determine patient infection status. The expected result is Negative.  Fact Sheet for Patients: EntrepreneurPulse.com.au  Fact Sheet for Healthcare Providers: IncredibleEmployment.be  This test is not yet approved or cleared by the [place] FDA and  has been authorized for detection and/or diagnosis of SARS-CoV-2 by FDA under an  Emergency Use Authorization (EUA).  This EUA will remain in effect (meaning this test can be used) for the duration of  the COVID-19 declaration under Section 564(b)(1) of the A ct, 21 U.S.C. section 360bbb-3(b)(1), unless the authorization is terminated or revoked sooner.     Influenza A by PCR NEGATIVE NEGATIVE Final   Influenza B by PCR NEGATIVE NEGATIVE Final    Comment: (NOTE) The Xpert Xpress SARS-CoV-2/FLU/RSV plus assay is intended as an aid in the diagnosis of influenza from Nasopharyngeal swab specimens and should not be used as a sole basis for treatment. Nasal washings and aspirates are unacceptable for Xpert Xpress SARS-CoV-2/FLU/RSV testing.  Fact Sheet for Patients: EntrepreneurPulse.com.au  Fact Sheet for Healthcare Providers: IncredibleEmployment.be  This test is not yet approved or cleared by the [place] FDA and has been authorized for detection and/or diagnosis of SARS-CoV-2 by FDA under an Emergency Use Authorization (EUA). This EUA will remain in effect (meaning this test can be used) for the duration of the COVID-19 declaration under Section 564(b)(1) of the Act, 21 U.S.C. section 360bbb-3(b)(1), unless the authorization is terminated or revoked.     Resp Syncytial Virus by PCR NEGATIVE NEGATIVE Final    Comment: (NOTE) Fact Sheet for Patients: EntrepreneurPulse.com.au  Fact Sheet for Healthcare Providers: IncredibleEmployment.be  This test is not yet approved or cleared by the [place] FDA and has been authorized for detection and/or diagnosis of SARS-CoV-2 by FDA under an Emergency Use Authorization (EUA). This EUA will remain in effect (meaning this test can be used) for the duration of the COVID-19 declaration under Section 564(b)(1) of the Act, 21 U.S.C. section 360bbb-3(b)(1), unless the authorization is terminated or revoked.  Performed at [facility], [place] [address], [place], [place] [address]   Culture, blood (routine x 2)     Status: None (Preliminary result)   Collection Time: 11/04/2022 11:53 AM   Specimen: BLOOD RIGHT FOREARM  Result Value Ref Range Status   Specimen Description BLOOD RIGHT FOREARM  Final   Special Requests   Final    BOTTLES DRAWN AEROBIC AND ANAEROBIC Blood Culture results may not be optimal due to an excessive volume of blood received in culture bottles   Culture   Final    NO GROWTH < 24 HOURS Performed at [facility], [place] [address], [place], [place] [address]    Report Status PENDING  Incomplete  Culture, blood (routine x 2)     Status: None (Preliminary result)   Collection Time: 10/29/2022 12:08 PM   Specimen: BLOOD  Result Value Ref Range Status   Specimen Description BLOOD RIGHT ANTECUBITAL  Final   Special Requests   Final    BOTTLES DRAWN AEROBIC AND ANAEROBIC Blood Culture results may not be optimal due to an excessive volume of blood received in culture bottles   Culture   Final  NO GROWTH < 24 HOURS Performed at [facility] [address] [place],  [address]    Report Status PENDING  Incomplete         Radiology Studies: MR BRAIN WO CONTRAST  Result Date: 11/25/2022 CLINICAL DATA:  82 year old male with altered mental status and neurologic deficit. Positive COVID-19. EXAM: MRI HEAD WITHOUT CONTRAST TECHNIQUE: Multiplanar, multiecho pulse sequences of the brain and surrounding structures were obtained without intravenous contrast. COMPARISON:  Head CT 11/19/2022.  Brain MRI 05/06/2022. FINDINGS: Brain: Study is intermittently degraded by motion artifact despite repeated imaging attempts. No restricted diffusion to suggest acute infarction. No midline shift, mass effect, evidence of mass lesion, ventriculomegaly, extra-axial collection or acute intracranial hemorrhage. Cervicomedullary junction and pituitary are within normal limits. Stable cerebral volume. Stable and  essentially normal for age gray and white matter signal throughout the brain. No cortical encephalomalacia or chronic cerebral blood products identified. No cerebral edema identified. Vascular: Major intracranial vascular flow voids are stable. Skull and upper cervical spine: Negative for age visible cervical spine. Visualized bone marrow signal is within normal limits. Sinuses/Orbits: Stable, negative orbits. Mild bilateral paranasal sinus inflammation including probable small sinus fluid levels. Other: Mastoids remain clear. Visible internal auditory structures appear normal. Small midline nasopharyngeal Tornwaldt cyst (normal variant). IMPRESSION: 1. Motion degraded exam with no acute intracranial abnormality. Stable noncontrast MRI appearance of the Brain since last year. 2. Mild paranasal sinus inflammation. Electronically Signed   By: Genevie Ann M.D.   On: 11/25/2022 10:40   CT Head Wo Contrast  Result Date: 11/22/2022 CLINICAL DATA:  Mental status change. Unknown cause. Recently diagnosed with COVID. Found by way urinating in the closet. History of dementia. EXAM: CT HEAD WITHOUT CONTRAST TECHNIQUE: Contiguous axial images were obtained from the base of the skull through the vertex without intravenous contrast. RADIATION DOSE REDUCTION: This exam was performed according to the departmental dose-optimization program which includes automated exposure control, adjustment of the mA and/or kV according to patient size and/or use of iterative reconstruction technique. COMPARISON:  CT brain 05/06/2022 FINDINGS: Brain: There is mild-to-moderate cortical atrophy, unchanged from prior and within normal limits for patient age. The ventricles are normal in configuration. The basilar cisterns are patent. No mass, mass effect, or midline shift. No acute intracranial hemorrhage is seen. No abnormal extra-axial fluid collection. Mild patchy periventricular white matter hypodensities are similar to prior, most likely  chronic ischemic white matter changes. Otherwise, preservation of the normal cortical gray-white interface without CT evidence of an acute major vascular territorial cortical based infarction. Vascular: No hyperdense vessel or unexpected calcification. Skull: Normal. Negative for fracture or focal lesion. Sinuses/Orbits: Status post bilateral ocular lens replacements. The frontal sinuses are again developmentally non aerated. Minimal peripheral mucosal thickening of the ethmoid air cells and left maxillary sinus, new from 04/29/2022. New small layering air-fluid level within right maxillary sinus. The mastoid air cells are clear. Other: None. IMPRESSION: 1. No acute intracranial process. 2. Mild-to-moderate cortical atrophy and mild chronic ischemic white matter changes, similar to prior. 3. New small layering air-fluid level within the right maxillary sinus, nonspecific but can be seen with acute sinusitis in the appropriate clinical setting. Electronically Signed   By: Yvonne Kendall M.D.   On: 11/14/2022 11:50   DG Chest Port 1 View  Result Date: 11/18/2022 CLINICAL DATA:  Shortness of breath EXAM: PORTABLE CHEST 1 VIEW COMPARISON:  05/06/2022 FINDINGS: Stable cardiomegaly status post sternotomy and CABG. Aortic atherosclerosis. No focal airspace consolidation, pleural  effusion, or pneumothorax. IMPRESSION: No active disease. Electronically Signed   By: Davina Poke D.O.   On: 11/03/2022 11:21    Scheduled Meds:  apixaban  5 mg Oral BID   aspirin EC  81 mg Oral Daily   Carbidopa-Levodopa ER  1 tablet Oral BID   donepezil  10 mg Oral QHS   rosuvastatin  20 mg Oral Daily   traZODone  100 mg Oral QHS   Continuous Infusions:  sodium chloride 75 mL/hr at 11/25/22 1725    LOS: 0 days   Time spent: 15mn  Nixxon C Alireza Pollack, DO [facility]  If 7PM-7AM, please contact night-coverage [email or web address]  11/26/2022, 8:10 AM

## 2022-11-26 NOTE — Progress Notes (Signed)
SLP Cancellation Note  Patient Details Name: Calvin Ramirez MRN: [id number] DOB: Jan 30, 1941   Cancelled treatment:        PT just left room and notes reveal he has increased lethargy today and minimally able to participate in that session. ST will defer today and plan to check on him tomorrow when he may be more alert. Would hold po's if he is not adequately safe and appropriate for po's at meal/med times.    Houston Siren 11/26/2022, 11:49 AM

## 2022-11-26 NOTE — Progress Notes (Signed)
Inpatient Rehab Admissions Coordinator:   Per therapy recommendations, pt was screened for CIR by Shann Medal, PT, DPT.  Noted pt is observation status at this time. Pt may not have the medical necessity to warrant an inpatient rehab stay if they remain observation. If pt were to qualify for inpatient status, AC will screen for candidacy.   Shann Medal, PT, DPT Admissions Coordinator [phone] 11/26/22  10:49 AM

## 2022-11-27 ENCOUNTER — Inpatient Hospital Stay (HOSPITAL_COMMUNITY): Payer: Medicare Other

## 2022-11-27 DIAGNOSIS — U071 COVID-19: Secondary | ICD-10-CM | POA: Diagnosis not present

## 2022-11-27 DIAGNOSIS — I4892 Unspecified atrial flutter: Secondary | ICD-10-CM | POA: Diagnosis not present

## 2022-11-27 DIAGNOSIS — R4182 Altered mental status, unspecified: Secondary | ICD-10-CM | POA: Diagnosis not present

## 2022-11-27 DIAGNOSIS — G9340 Encephalopathy, unspecified: Secondary | ICD-10-CM | POA: Diagnosis not present

## 2022-11-27 LAB — CBC
HCT: 37.5 % — ABNORMAL LOW (ref 39.0–52.0)
Hemoglobin: 12.6 g/dL — ABNORMAL LOW (ref 13.0–17.0)
MCH: 31.1 pg (ref 26.0–34.0)
MCHC: 33.6 g/dL (ref 30.0–36.0)
MCV: 92.6 fL (ref 80.0–100.0)
Platelets: 120 10*3/uL — ABNORMAL LOW (ref 150–400)
RBC: 4.05 MIL/uL — ABNORMAL LOW (ref 4.22–5.81)
RDW: 13.4 % (ref 11.5–15.5)
WBC: 8.1 10*3/uL (ref 4.0–10.5)
nRBC: 0 % (ref 0.0–0.2)

## 2022-11-27 LAB — BASIC METABOLIC PANEL
Anion gap: 10 (ref 5–15)
BUN: 26 mg/dL — ABNORMAL HIGH (ref 8–23)
CO2: 19 mmol/L — ABNORMAL LOW (ref 22–32)
Calcium: 8.4 mg/dL — ABNORMAL LOW (ref 8.9–10.3)
Chloride: 110 mmol/L (ref 98–111)
Creatinine, Ser: 1.21 mg/dL (ref 0.61–1.24)
GFR, Estimated: 60 mL/min — ABNORMAL LOW (ref >60)
Glucose, Bld: 123 mg/dL — ABNORMAL HIGH (ref 70–99)
Potassium: 4 mmol/L (ref 3.5–5.1)
Sodium: 139 mmol/L (ref 135–145)

## 2022-11-27 MED ORDER — HALOPERIDOL LACTATE 5 MG/ML IJ SOLN
1.0000 mg | Freq: Four times a day (QID) | INTRAMUSCULAR | Status: DC | PRN
  Administered 2022-11-30 – 2022-12-01 (×2): 1 mg via INTRAVENOUS
  Filled 2022-11-27 (×2): qty 1

## 2022-11-27 MED ORDER — SODIUM CHLORIDE 0.9 % IV SOLN: INTRAVENOUS | Status: AC

## 2022-11-27 NOTE — Progress Notes (Signed)
Occupational Therapy Treatment Patient Details Name: Calvin Ramirez MRN: [id number] DOB: [birth year] Today's Date: 11/27/2022   History of present illness Patient is an 82 y.o. male with PMH: Parkinson's disease, CAD s/p CABG, HTN, HLD, lewy body dementia. He presented to the ED on 11/07/2022 for evaluation of worsening mentations and fall at home.   Covid +   OT comments  Pt with slow progress towards established OT goals. Pt continues with lethargy, and observed with eyes open ~30% of session with max multimodal cues to maintain eyes open during these times. Pt with consistent verbal responses, however, max difficult to understand. Pt sitting EOB with min-mod A due to posterior bias, level of arousal, and decreased core strength. Max-total hand over hand A to perform oral care with dry toothbrush and wash face. Pt with BUE tremor present throughout session. Occasionally able to understand speech and pt with relevant thoughts stating "that is where my brains are" when asked to move head, and reference to child flying to [facility] in February.  Due to continued lethargy and slow progress, updating discharge recommendation to SNF to optimize safety and independence in ADL and IADL.    Recommendations for follow up therapy are one component of a multi-disciplinary discharge planning process, led by the attending physician.  Recommendations may be updated based on patient status, additional functional criteria and insurance authorization.    Follow Up Recommendations  Skilled nursing-short term rehab (<3 hours/day)     Assistance Recommended at Discharge Frequent or constant Supervision/Assistance  Patient can return home with the following  Two people to help with walking and/or transfers;Two people to help with bathing/dressing/bathroom;Direct supervision/assist for medications management;Direct supervision/assist for financial management;Assist for transportation;Help with stairs or ramp for  entrance;Assistance with feeding;Assistance with cooking/housework   Equipment Recommendations  Other (comment) (defer to next venue of care)    Recommendations for Other Services      Precautions / Restrictions Precautions Precautions: Fall Restrictions Weight Bearing Restrictions: No       Mobility Bed Mobility Overal bed mobility: Needs Assistance Bed Mobility: Supine to Sit, Sit to Supine     Supine to sit: Total assist, +2 for physical assistance, HOB elevated Sit to supine: Total assist, +2 for physical assistance   General bed mobility comments: to EOB with use of bed pad to pivot/come to edge    Transfers                   General transfer comment: Unable     Balance Overall balance assessment: Needs assistance Sitting-balance support: Feet supported Sitting balance-Leahy Scale: Zero Sitting balance - Comments: posterior LOB if left unaided, min to mod A at EOB with cues; sat EOB ~10 minutes with attempts to engage in ADLs (see OT note); propping on each forearm at his side and pt was better able to engage abd muscles and assist with his balance; able to push up from rt forearm to sitting position Postural control: Posterior lean                                 ADL either performed or assessed with clinical judgement   ADL Overall ADL's : Needs assistance/impaired Eating/Feeding: Total assistance;Sitting Eating/Feeding Details (indicate cue type and reason): PT feeding pt spple sauce, and pt opening mouth in anticipation with verbal cues, but appeared to be holding in mouth/pocketing, as experiencing anterior fluid loss ~1 minute later Grooming: Wash/dry  face;Oral care;Maximal assistance;Total assistance;Sitting Grooming Details (indicate cue type and reason): hand over hand A. Pt observed to touch face, but not following commands to engage in ADL. Appeared to attempt to engage occasionally, but overall not following commands. Greater skill  to reach face with trunk fully supported with max a provided from therapist for sitting balance.                               General ADL Comments: Max-total A due to apparent confusion    Extremity/Trunk Assessment Upper Extremity Assessment Upper Extremity Assessment: Generalized weakness (BUE tremor present at rest sitting EOB. "pill rolling" consistent with PD, but wife reporting he does not typically appear to be grabbing sheet/clothing at home as he is here)   Lower Extremity Assessment Lower Extremity Assessment: Defer to PT evaluation        Vision   Additional Comments: Pt with eyes closed ~70% of session. Limited eye contact, but was observed to occasionally scan.   Perception     Praxis      Cognition Arousal/Alertness: Lethargic Behavior During Therapy: Flat affect Overall Cognitive Status: Difficult to assess Area of Impairment: Attention, Orientation, Following commands, Safety/judgement, Awareness, Problem solving                 Orientation Level: Disoriented to, Situation (difficult to assess due to level of arousal and garbles speech, but pt with relevant references to future events (daughter coming in february)) Current Attention Level: Focused   Following Commands: Follows one step commands inconsistently, Follows one step commands with increased time Safety/Judgement: Decreased awareness of safety, Decreased awareness of deficits Awareness: Intellectual Problem Solving: Requires verbal cues, Requires tactile cues, Difficulty sequencing, Decreased initiation, Slow processing General Comments: opened eyes off/on throughout session (open ~30% of the time); speech very garbled but occasionally intelligible and appropriate (wife discussing daughter flying in today, pt said "not until February" which wife stated is when son is flying in; when told to lift his head, "that's where my brains are."        Exercises      Shoulder Instructions        General Comments Wife present, with awareness pt likely to need SNF at [place] and interested in resources to make this decision.    Pertinent Vitals/ Pain       Pain Assessment Pain Assessment: Faces Faces Pain Scale: No hurt Pain Intervention(s): Monitored during session  Home Living                                          Prior Functioning/Environment              Frequency  Min 2X/week        Progress Toward Goals  OT Goals(current goals can now be found in the care plan section)  Progress towards OT goals: Progressing toward goals  Acute Rehab OT Goals Patient Stated Goal: none stated OT Goal Formulation: With patient Time For Goal Achievement: 12/09/22 Potential to Achieve Goals: Good ADL Goals Pt Will Perform Eating: with set-up;with supervision;sitting Pt Will Perform Grooming: with set-up;with supervision;sitting Pt Will Perform Upper Body Bathing: with supervision;with set-up;sitting Pt Will Transfer to Toilet: with min assist;ambulating  Plan Discharge plan remains appropriate    Co-evaluation    PT/OT/SLP Co-Evaluation/Treatment: Yes Reason for Co-Treatment: For  patient/therapist safety;To address functional/ADL transfers;Other (comment) (due to pt lethargy) PT goals addressed during session: Mobility/safety with mobility OT goals addressed during session: ADL's and self-care      AM-PAC OT "6 Clicks" Daily Activity     Outcome Measure   Help from another person eating meals?: Total Help from another person taking care of personal grooming?: A Lot Help from another person toileting, which includes using toliet, bedpan, or urinal?: A Lot Help from another person bathing (including washing, rinsing, drying)?: Total Help from another person to put on and taking off regular upper body clothing?: Total Help from another person to put on and taking off regular lower body clothing?: Total 6 Click Score: 8    End of  Session    OT Visit Diagnosis: Other abnormalities of gait and mobility (R26.89);Unsteadiness on feet (R26.81);Muscle weakness (generalized) (M62.81);History of falling (Z91.81);Other symptoms and signs involving cognitive function;Other symptoms and signs involving the nervous system (R29.898)   Activity Tolerance Patient limited by lethargy   Patient Left in bed;with call bell/phone within reach;with bed alarm set;with family/visitor present   Nurse Communication Mobility status        Time: 1045-1110 OT Time Calculation (min): 25 min  Charges: OT General Charges $OT Visit: 1 Visit OT Treatments $Self Care/Home Management : 8-22 mins  Elder Cyphers, OTR/L [facility] Acute Rehabilitation Office: [phone]    Magnus Ivan 11/27/2022, 12:00 PM

## 2022-11-27 NOTE — Procedures (Signed)
Patient Name: Calvin Ramirez  MRN: [id number]  Epilepsy Attending: Lora Havens  Referring Physician/Provider: Bonnielee Haff, MD  Date: 11/27/2022 Duration: 22.51 mins  Patient history: 82yo M with ams. EEG to evaluate for seizure.  Level of alertness: Awake  AEDs during EEG study: None  Technical aspects: This EEG study was done with scalp electrodes positioned according to the 10-20 International system of electrode placement. Electrical activity was reviewed with band pass filter of 1-'70Hz'$ , sensitivity of 7 uV/mm, display speed of 78m/sec with a '60Hz'$  notched filter applied as appropriate. EEG data were recorded continuously and digitally stored.  Video monitoring was available and reviewed as appropriate.  Description: The posterior dominant rhythm consists of 7 Hz activity of moderate voltage (25-35 uV) seen predominantly in posterior head regions, symmetric and reactive to eye opening and eye closing. EEG showed continuous generalized 5 to 7 Hz theta slowing. Hyperventilation and photic stimulation were not performed.     ABNORMALITY - Continuous slow, generalized -  Background slow  IMPRESSION: This study is suggestive of moderate diffuse encephalopathy, nonspecific etiology. No seizures or epileptiform discharges were seen throughout the recording.  Shakeerah Gradel OBarbra Sarks

## 2022-11-27 NOTE — Progress Notes (Signed)
EEG complete - results pending 

## 2022-11-27 NOTE — Discharge Instructions (Signed)

## 2022-11-27 NOTE — Progress Notes (Addendum)
PROGRESS NOTE    Calvin Ramirez  JME:[id number] DOB: Jul 11, 1941 DOA: 10/29/2022 PCP: Shirline Frees, MD   Brief Narrative:  Calvin Ramirez is a 82 y.o. male with medical history significant of Parkinson's dementia, CAD status post CABG, HTN, HLD, question of PAF, brought in by family member for evaluation of worsening mentations and questionable unilateral weakness on the left as well as worsening confusion from baseline dementia for approximately 5 days.  Typically at baseline is able to ambulate restroom by himself and is verbal but somewhat difficult to understand.  3 days prior to admission patient was evaluated by PCP with positive COVID swab and started on molnupiravir(12/28) -with worsening mental status and ultimately presented to the hospital 12/31.   Assessment & Plan:   Acute metabolic encephalopathy on baseline parkinsons dementia -COVID is the most likely etiology of patient's confusion -Molnupiravir has known side effect of dizziness possibly accounting for fall but mental status changes are uncommon per package insert. -Patient completed molnupiravir prior to admission, no further indication for retesting or treatment at this time. Patient has been afebrile.  WBC has been normal.  Lactic acid level normal. UA did not suggest infection.  Chest x-ray did not show any pneumonia. CT head did not show any acute intracranial findings.  MRI brain was motion degraded but did not show any acute findings. Benzodiazepines have been discontinued. Patient remains confused and mildly agitated.  Noted to be on Aricept which is being continued.  On trazodone at bedtime.  May need to discontinue this as it could worsen his agitation. On Haldol as needed. Aspiration precautions.  COVID-19 infection He apparently has completed course of molnupiravir.  Currently afebrile with normal WBC.  Not hypoxic.  No indication for any further treatments at this time.  This was communicated to  the wife. Positive result in our system is from 12/31.  However it looks like patient was positive 3 days prior to that based on documentation at the time of admission.  So it looks like he was positive on 12/28.  His 10-day isolation will end on 12/7.  He can come off of isolation on 12/8.  PAF/paroxysmal a flutter -This appears to be a new diagnosis.  Previous rounding MD discussed with cardiology.  Started on Eliquis.  Rate has been controlled so not on beta-blocker or calcium channel blocker.   -Follow-up outpatient with cardiology as scheduled   Questionable unilateral weakness,  CVA ruled out -Physical exam is somewhat difficult to obtain given patient's baseline Parkinson's symptoms and difficulty following commands but moving all limbs with equal strength. -MRI unremarkable for acute CVA -Continue PT OT as tolerated - CIR recommended   Volume contraction/dehydration -Secondary to acute COVID infection and poor p.o. intake, IV fluids at intake completed   Essential hypertension Was hypotensive.  Home medications on hold.  Blood pressures appear to have stabilized.  Continue to monitor.   Parkinson's dementia -Continue Sinemet -Baseline Parkinson's, ambulates independently, able to restroom by himself, speech is somewhat unintelligible at baseline, but otherwise requires assistance with ADLs.  DVT prophylaxis: Eliquis Code Status: Full Family Communication: Wife at bedside Disposition: To be determined  Status is: Inpatient Remains inpatient appropriate because: Altered mental status    Consultants:  None  Procedures:  None  Antimicrobials:  None indicated  Subjective: Patient opens her eyes occasionally.  Does not answer questions.  Does not follow commands.  Moving all his extremities.  Wife is at the bedside.  Objective: Vitals:   11/26/22  2032 11/27/22 0047 11/27/22 0432 11/27/22 0928  BP: 111/75 122/67 (!) 117/96 133/85  Pulse: 81 78 76 80  Resp: '16 16 20  20  '$ Temp: 99 F (37.2 C) 99.7 F (37.6 C) 98.8 F (37.1 C) 99 F (37.2 C)  TempSrc: Axillary Axillary Axillary Axillary  SpO2: 98% 95% 95% 95%  Weight:      Height:        Intake/Output Summary (Last 24 hours) at 11/27/2022 1020 Last data filed at 11/27/2022 0930 Gross per 24 hour  Intake 2067.45 ml  Output 1400 ml  Net 667.45 ml    Filed Weights   11/25/22 1526  Weight: 79.4 kg    Examination:  General appearance: Delirious.  In no distress Resp: Clear to auscultation bilaterally.  Normal effort Cardio: S1-S2 is normal regular.  No S3-S4.  No rubs murmurs or bruit GI: Abdomen is soft.  Nontender nondistended.  Bowel sounds are present normal.  No masses organomegaly Extremities: No edema.  Full range of motion of lower extremities. Neurologic: Confused, disoriented.    Data Reviewed: I have personally reviewed following labs and imaging studies  CBC: Recent Labs  Lab 11/13/2022 1153 11/11/2022 1233 11/27/22 0545  WBC 4.9  --  8.1  NEUTROABS 3.6  --   --   HGB 13.2 13.3  13.6 12.6*  HCT 38.9* 39.0  40.0 37.5*  MCV 91.7  --  92.6  PLT 148*  --  120*    Basic Metabolic Panel: Recent Labs  Lab 10/28/2022 1153 11/20/2022 1233 11/27/22 0545  NA 136 135  136 139  K 3.9 3.9  3.9 4.0  CL 102 102 110  CO2 22  --  19*  GLUCOSE 102* 97 123*  BUN 30* 29* 26*  CREATININE 1.37* 1.30* 1.21  CALCIUM 8.9  --  8.4*    GFR: Estimated Creatinine Clearance: 50.1 mL/min (by C-G formula based on SCr of 1.21 mg/dL). Liver Function Tests: Recent Labs  Lab 11/12/2022 1153  AST 27  ALT 21  ALKPHOS 57  BILITOT 1.0  PROT 6.5  ALBUMIN 3.6     CBG: Recent Labs  Lab 11/22/2022 1148  GLUCAP 101*     Sepsis Labs: Recent Labs  Lab 11/04/2022 1153 11/22/2022 1305  LATICACIDVEN 0.9 1.8     Recent Results (from the past 240 hour(s))  Urine Culture     Status: None   Collection Time: 11/23/2022 11:05 AM   Specimen: Urine, Clean Catch  Result Value Ref Range Status    Specimen Description URINE, CLEAN CATCH  Final   Special Requests NONE  Final   Culture   Final    NO GROWTH Performed at [facility], [place] [address] [place], [place] [address]    Report Status 11/26/2022 FINAL  Final  Resp panel by RT-PCR (RSV, Flu A&B, Covid) Anterior Nasal Swab     Status: Abnormal   Collection Time: 11/14/2022 11:53 AM   Specimen: Anterior Nasal Swab  Result Value Ref Range Status   SARS Coronavirus 2 by RT PCR POSITIVE (A) NEGATIVE Final    Comment: (NOTE) SARS-CoV-2 target nucleic acids are DETECTED.  The SARS-CoV-2 RNA is generally detectable in upper respiratory specimens during the acute phase of infection. Positive results are indicative of the presence of the identified virus, but do not rule out bacterial infection or co-infection with other pathogens not detected by the test. Clinical correlation with patient history and other diagnostic information is necessary to determine patient infection  status. The expected result is Negative.  Fact Sheet for Patients: EntrepreneurPulse.com.au  Fact Sheet for Healthcare Providers: IncredibleEmployment.be  This test is not yet approved or cleared by the [place] FDA and  has been authorized for detection and/or diagnosis of SARS-CoV-2 by FDA under an Emergency Use Authorization (EUA).  This EUA will remain in effect (meaning this test can be used) for the duration of  the COVID-19 declaration under Section 564(b)(1) of the A ct, 21 U.S.C. section 360bbb-3(b)(1), unless the authorization is terminated or revoked sooner.     Influenza A by PCR NEGATIVE NEGATIVE Final   Influenza B by PCR NEGATIVE NEGATIVE Final    Comment: (NOTE) The Xpert Xpress SARS-CoV-2/FLU/RSV plus assay is intended as an aid in the diagnosis of influenza from Nasopharyngeal swab specimens and should not be used as a sole basis for treatment. Nasal washings and aspirates are unacceptable  for Xpert Xpress SARS-CoV-2/FLU/RSV testing.  Fact Sheet for Patients: EntrepreneurPulse.com.au  Fact Sheet for Healthcare Providers: IncredibleEmployment.be  This test is not yet approved or cleared by the [place] FDA and has been authorized for detection and/or diagnosis of SARS-CoV-2 by FDA under an Emergency Use Authorization (EUA). This EUA will remain in effect (meaning this test can be used) for the duration of the COVID-19 declaration under Section 564(b)(1) of the Act, 21 U.S.C. section 360bbb-3(b)(1), unless the authorization is terminated or revoked.     Resp Syncytial Virus by PCR NEGATIVE NEGATIVE Final    Comment: (NOTE) Fact Sheet for Patients: EntrepreneurPulse.com.au  Fact Sheet for Healthcare Providers: IncredibleEmployment.be  This test is not yet approved or cleared by the [place] FDA and has been authorized for detection and/or diagnosis of SARS-CoV-2 by FDA under an Emergency Use Authorization (EUA). This EUA will remain in effect (meaning this test can be used) for the duration of the COVID-19 declaration under Section 564(b)(1) of the Act, 21 U.S.C. section 360bbb-3(b)(1), unless the authorization is terminated or revoked.  Performed at [facility], [place] [address] [place], [place] [address]   Culture, blood (routine x 2)     Status: None (Preliminary result)   Collection Time: 11/15/2022 11:53 AM   Specimen: BLOOD RIGHT FOREARM  Result Value Ref Range Status   Specimen Description BLOOD RIGHT FOREARM  Final   Special Requests   Final    BOTTLES DRAWN AEROBIC AND ANAEROBIC Blood Culture results may not be optimal due to an excessive volume of blood received in culture bottles   Culture   Final    NO GROWTH 3 DAYS Performed at [facility], [place] [address] [place], [place] [address]    Report Status PENDING  Incomplete  Culture, blood (routine x 2)      Status: None (Preliminary result)   Collection Time: 11/01/2022 12:08 PM   Specimen: BLOOD  Result Value Ref Range Status   Specimen Description BLOOD RIGHT ANTECUBITAL  Final   Special Requests   Final    BOTTLES DRAWN AEROBIC AND ANAEROBIC Blood Culture results may not be optimal due to an excessive volume of blood received in culture bottles   Culture   Final    NO GROWTH 3 DAYS Performed at [facility], [place] [address] [place], [place] [address]    Report Status PENDING  Incomplete         Radiology Studies: No results found.  Scheduled Meds:  apixaban  5 mg Oral BID   aspirin EC  81 mg  Oral Daily   Carbidopa-Levodopa ER  1 tablet Oral BID   donepezil  10 mg Oral QHS   rosuvastatin  20 mg Oral Daily   traZODone  100 mg Oral QHS   Continuous Infusions:  sodium chloride 75 mL/hr at 11/27/22 0656    LOS: 1 day    Bonnielee Haff,  [facility]  If 7PM-7AM, please contact night-coverage [email or web address]  11/27/2022, 10:20 AM

## 2022-11-27 NOTE — Plan of Care (Signed)

## 2022-11-27 NOTE — Progress Notes (Signed)
Speech Language Pathology Treatment: Dysphagia  Patient Details Name: Calvin Ramirez MRN: [id number] DOB: 12-May-1941 Today's Date: 11/27/2022 Time: 1012-1030 SLP Time Calculation (min) (ACUTE ONLY): 18 min  Assessment / Plan / Recommendation Clinical Impression  Pt continues with lethargy and wife attempting to feed when SLP arrived. Therapist repositioned pt to upright and he briefly opened eyes and speaking (nonsensically). He did accept spoon of water from wife then straw sip from therapist and he immediately coughed and again (immediate and delayed) when therapist backed down to using spoon. He took one bite applesauce before he fell back asleep, no vocalizations and drooling. At that point therapist stopped feeding and educated wife re: clinical reasoning with explanation of aspiration and consequences. Explained that he does not have to have his eyes open but in order for safe feeding he needs to be in an upright position, accepting of spoon (closing lips around spoon) and responsive to her before trying any po's. If he starts to fall asleep, provide stimulation and if he is unable to rouse then do not attempt to feed. SLP downgraded his liquids to a nectar thick, recommend continue with puree (Dys 1) and pills crushed. ST will continue to see pt for safety and provide support and education to wife.   HPI HPI: Patient is an 82 y.o. male with PMH: Parkinson's disease, CAD s/p CABG, HTN, HLD, lewy body dementia. He presented to the ED on 10/25/2022 for evaluation of worsening mentations.   Covid +      SLP Plan  Continue with current plan of care      Recommendations for follow up therapy are one component of a multi-disciplinary discharge planning process, led by the attending physician.  Recommendations may be updated based on patient status, additional functional criteria and insurance authorization.    Recommendations  Diet recommendations: Dysphagia 1 (puree);Nectar-thick  liquid Liquids provided via: Teaspoon;Cup Medication Administration: Crushed with puree Supervision: Full supervision/cueing for compensatory strategies;Staff to assist with self feeding Compensations: Slow rate;Small sips/bites;Lingual sweep for clearance of pocketing;Monitor for anterior loss Postural Changes and/or Swallow Maneuvers: Seated upright 90 degrees                Oral Care Recommendations: Oral care BID Follow Up Recommendations: Acute inpatient rehab (3hours/day) Assistance recommended at discharge: Frequent or constant Supervision/Assistance SLP Visit Diagnosis: Dysphagia, unspecified (R13.10) Plan: Continue with current plan of care           [place] Siren  11/27/2022, 10:40 AM

## 2022-11-27 NOTE — Progress Notes (Signed)
Physical Therapy Treatment Patient Details Name: Calvin Ramirez MRN: [id number] DOB: [birth year] Today's Date: 11/27/2022   History of Present Illness Patient is an 82 y.o. male with PMH: Parkinson's disease, CAD s/p CABG, HTN, HLD, lewy body dementia. He presented to the ED on 11/03/2022 for evaluation of worsening mentations and fall at home.   Covid +    PT Comments    Patient slightly more arousable than 11/26/22. With +2 total assist, able to sit pt up on EOB and became more alert with eyes open ~30% of session (even when eyes closed, pt still responding verbally). Required min to mod assist to maintain sitting balance due to posterior bias. Tried to engage pt in ADLs and UE pushing/pulling with no engagement. Pt returned to supine with wife present.     Recommendations for follow up therapy are one component of a multi-disciplinary discharge planning process, led by the attending physician.  Recommendations may be updated based on patient status, additional functional criteria and insurance authorization.  Follow Up Recommendations  Skilled nursing-short term rehab (<3 hours/day) Can patient physically be transported by private vehicle: No   Assistance Recommended at Discharge Frequent or constant Supervision/Assistance  Patient can return home with the following A lot of help with bathing/dressing/bathroom;Assistance with cooking/housework;Direct supervision/assist for medications management;Assist for transportation;Help with stairs or ramp for entrance;Two people to help with walking and/or transfers   Equipment Recommendations  Wheelchair (measurements PT)    Recommendations for Other Services       Precautions / Restrictions Precautions Precautions: Fall Restrictions Weight Bearing Restrictions: No     Mobility  Bed Mobility Overal bed mobility: Needs Assistance Bed Mobility: Supine to Sit, Sit to Supine     Supine to sit: Total assist, +2 for physical assistance,  HOB elevated Sit to supine: Total assist, +2 for physical assistance   General bed mobility comments: to EOB with use of bed pad to pivot/come to edge    Transfers                   General transfer comment: unable    Ambulation/Gait               General Gait Details: unable   Stairs             Wheelchair Mobility    Modified Rankin (Stroke Patients Only)       Balance Overall balance assessment: Needs assistance Sitting-balance support: Feet supported Sitting balance-Leahy Scale: Zero Sitting balance - Comments: posterior LOB if left unaided, min to mod A at EOB with cues; sat EOB ~0 minutes with attempts to engage in ADLs (see OT note); propping on each forearm at his side and pt was better able to engage abd muscles and assist with his balance; able to push up from rt forearm to sitting position Postural control: Posterior lean                                  Cognition Arousal/Alertness: Lethargic Behavior During Therapy: Flat affect Overall Cognitive Status: Difficult to assess Area of Impairment: Attention, Orientation, Following commands, Safety/judgement, Awareness, Problem solving                 Orientation Level: Disoriented to, Person, Place, Time, Situation Current Attention Level: Focused   Following Commands: Follows one step commands inconsistently, Follows one step commands with increased time Safety/Judgement: Decreased awareness of safety, Decreased awareness  of deficits Awareness: Intellectual Problem Solving: Requires verbal cues, Requires tactile cues, Difficulty sequencing, Decreased initiation, Slow processing General Comments: opened eyes off/on throughout session (open ~30% of the time); speech very garbled but occasionally intelligible and appropriate (wife discussing daughter flying in today, pt said "not until February" which wife stated is when son is flying in; when told to lift his head, "that's  where my brains are."        Exercises      General Comments General comments (skin integrity, edema, etc.): Wife present. Talking about pt needing to go to SNF      Pertinent Vitals/Pain Pain Assessment Pain Assessment: Faces Faces Pain Scale: No hurt    Home Living                          Prior Function            PT Goals (current goals can now be found in the care plan section) Acute Rehab PT Goals Patient Stated Goal: to get rehab prior to home Time For Goal Achievement: 12/09/22 Potential to Achieve Goals: Good Progress towards PT goals: Progressing toward goals    Frequency    Min 3X/week      PT Plan Discharge plan needs to be updated    Co-evaluation PT/OT/SLP Co-Evaluation/Treatment: Yes Reason for Co-Treatment: For patient/therapist safety;To address functional/ADL transfers;Other (comment) (due to lethargy) PT goals addressed during session: Mobility/safety with mobility;Balance        AM-PAC PT "6 Clicks" Mobility   Outcome Measure  Help needed turning from your back to your side while in a flat bed without using bedrails?: Total Help needed moving from lying on your back to sitting on the side of a flat bed without using bedrails?: Total Help needed moving to and from a bed to a chair (including a wheelchair)?: Total Help needed standing up from a chair using your arms (e.g., wheelchair or bedside chair)?: Total Help needed to walk in hospital room?: Total Help needed climbing 3-5 steps with a railing? : Total 6 Click Score: 6    End of Session   Activity Tolerance: Patient limited by lethargy Patient left: with family/visitor present;in bed;with call bell/phone within reach;with bed alarm set   PT Visit Diagnosis: Other abnormalities of gait and mobility (R26.89);Muscle weakness (generalized) (M62.81);Other symptoms and signs involving the nervous system (R29.898)     Time: 9449-6759 PT Time Calculation (min) (ACUTE ONLY):  24 min  Charges:  $Therapeutic Activity: 8-22 mins                      Arby Barrette, PT Acute Rehabilitation Services  Office [phone]    Rexanne Mano 11/27/2022, 11:28 AM

## 2022-11-28 DIAGNOSIS — U071 COVID-19: Secondary | ICD-10-CM | POA: Diagnosis not present

## 2022-11-28 DIAGNOSIS — G9340 Encephalopathy, unspecified: Secondary | ICD-10-CM | POA: Diagnosis not present

## 2022-11-28 LAB — VITAMIN B12: Vitamin B-12: 6153 pg/mL — ABNORMAL HIGH (ref 180–914)

## 2022-11-28 LAB — COMPREHENSIVE METABOLIC PANEL
ALT: 9 U/L (ref 0–44)
AST: 26 U/L (ref 15–41)
Albumin: 2.9 g/dL — ABNORMAL LOW (ref 3.5–5.0)
Alkaline Phosphatase: 54 U/L (ref 38–126)
Anion gap: 10 (ref 5–15)
BUN: 29 mg/dL — ABNORMAL HIGH (ref 8–23)
CO2: 18 mmol/L — ABNORMAL LOW (ref 22–32)
Calcium: 8.8 mg/dL — ABNORMAL LOW (ref 8.9–10.3)
Chloride: 115 mmol/L — ABNORMAL HIGH (ref 98–111)
Creatinine, Ser: 1.33 mg/dL — ABNORMAL HIGH (ref 0.61–1.24)
GFR, Estimated: 53 mL/min — ABNORMAL LOW (ref >60)
Glucose, Bld: 131 mg/dL — ABNORMAL HIGH (ref 70–99)
Potassium: 3.9 mmol/L (ref 3.5–5.1)
Sodium: 143 mmol/L (ref 135–145)
Total Bilirubin: 1.7 mg/dL — ABNORMAL HIGH (ref 0.3–1.2)
Total Protein: 5.7 g/dL — ABNORMAL LOW (ref 6.5–8.1)

## 2022-11-28 LAB — CBC
HCT: 40.5 % (ref 39.0–52.0)
Hemoglobin: 13.4 g/dL (ref 13.0–17.0)
MCH: 31.2 pg (ref 26.0–34.0)
MCHC: 33.1 g/dL (ref 30.0–36.0)
MCV: 94.4 fL (ref 80.0–100.0)
Platelets: 142 10*3/uL — ABNORMAL LOW (ref 150–400)
RBC: 4.29 MIL/uL (ref 4.22–5.81)
RDW: 13.4 % (ref 11.5–15.5)
WBC: 7.7 10*3/uL (ref 4.0–10.5)
nRBC: 0 % (ref 0.0–0.2)

## 2022-11-28 LAB — TSH: TSH: 1.585 u[IU]/mL (ref 0.350–4.500)

## 2022-11-28 LAB — RETICULOCYTES
Immature Retic Fract: 10 % (ref 2.3–15.9)
RBC.: 4.22 MIL/uL (ref 4.22–5.81)
Retic Count, Absolute: 22.4 10*3/uL (ref 19.0–186.0)
Retic Ct Pct: 0.5 % (ref 0.4–3.1)

## 2022-11-28 LAB — IRON AND TIBC
Iron: 32 ug/dL — ABNORMAL LOW (ref 45–182)
Saturation Ratios: 10 % — ABNORMAL LOW (ref 17.9–39.5)
TIBC: 332 ug/dL (ref 250–450)
UIBC: 300 ug/dL

## 2022-11-28 LAB — FERRITIN: Ferritin: 107 ng/mL (ref 24–336)

## 2022-11-28 LAB — FOLATE: Folate: 16.3 ng/mL (ref >5.9)

## 2022-11-28 LAB — RPR: RPR Ser Ql: NONREACTIVE

## 2022-11-28 LAB — MAGNESIUM: Magnesium: 2 mg/dL (ref 1.7–2.4)

## 2022-11-28 LAB — C-REACTIVE PROTEIN: CRP: 9.7 mg/dL — ABNORMAL HIGH (ref <1.0)

## 2022-11-28 LAB — AMMONIA: Ammonia: 29 umol/L (ref 9–35)

## 2022-11-28 MED ORDER — SODIUM CHLORIDE 0.9 % IV SOLN: INTRAVENOUS | Status: AC

## 2022-11-28 NOTE — Progress Notes (Signed)
Speech Language Pathology Treatment: Dysphagia  Patient Details Name: Calvin Ramirez MRN: [id number] DOB: Mar 24, 1941 Today's Date: 11/28/2022 Time: 1314-3888 SLP Time Calculation (min) (ACUTE ONLY): 12 min  Assessment / Plan / Recommendation Clinical Impression  Pt exhibited increased interaction and alertness this session with wife and daughters at bedside. He mostly kept eyes closed but able to accept nectar thick and puree when presented to lips and occasionally verbal. Additional time needed as he sometimes kept lips pursed but eventually opened when ready. RN was giving pills when therapist entered and only time he coughed throughout session was when reclined to supine to reposition. Suspect delayed swallow onset intermittently. Wife stated he feel asleep earlier so she stopped feeding and therapist gave positive reinforcement that she was reading pt's cues accurately. Reiterated controlling his environment to increase safety during meals/meds. Recommend continue nectar thick liquids, puree (Dys 1) texture, pills crush and swallow precautions.    HPI HPI: Patient is an 82 y.o. male with PMH: Parkinson's disease, CAD s/p CABG, HTN, HLD, lewy body dementia. He presented to the ED on 10/31/2022 for evaluation of worsening mentations.   Covid +      SLP Plan  Continue with current plan of care      Recommendations for follow up therapy are one component of a multi-disciplinary discharge planning process, led by the attending physician.  Recommendations may be updated based on patient status, additional functional criteria and insurance authorization.    Recommendations  Diet recommendations: Dysphagia 1 (puree);Nectar-thick liquid Liquids provided via: Cup;Teaspoon Medication Administration: Crushed with puree Supervision: Full supervision/cueing for compensatory strategies;Staff to assist with self feeding Compensations: Slow rate;Small sips/bites;Lingual sweep for clearance of  pocketing;Monitor for anterior loss Postural Changes and/or Swallow Maneuvers: Seated upright 90 degrees                Oral Care Recommendations: Oral care BID Follow Up Recommendations:  (TBD) Assistance recommended at discharge: Frequent or constant Supervision/Assistance SLP Visit Diagnosis: Dysphagia, unspecified (R13.10) Plan: Continue with current plan of care           Houston Siren  11/28/2022, 10:17 AM

## 2022-11-28 NOTE — Plan of Care (Signed)

## 2022-11-28 NOTE — Progress Notes (Signed)
PROGRESS NOTE    Calvin Ramirez  BJS:[id number] DOB: [birth year] DOA: 11/06/2022 PCP: Shirline Frees, MD   Brief Narrative:  Calvin Ramirez is a 82 y.o. male with medical history significant of Parkinson's dementia, CAD status post CABG, HTN, HLD, question of PAF, brought in by family member for evaluation of worsening mentations and questionable unilateral weakness on the left as well as worsening confusion from baseline dementia for approximately 5 days.  Typically at baseline is able to ambulate restroom by himself and is verbal but somewhat difficult to understand.  3 days prior to admission patient was evaluated by PCP with positive COVID swab and started on molnupiravir(12/28) -with worsening mental status and ultimately presented to the hospital 12/31.   Assessment & Plan:   Acute metabolic encephalopathy on baseline parkinsons dementia -COVID is the most likely etiology of patient's confusion -Molnupiravir has known side effect of dizziness possibly accounting for fall but mental status changes are uncommon per package insert. -Patient completed molnupiravir prior to admission, no further indication for retesting or treatment at this time. Patient has been afebrile.  WBC has been normal.  Lactic acid level normal. UA did not suggest infection.  Chest x-ray did not show any pneumonia. CT head did not show any acute intracranial findings.  MRI brain was motion degraded but did not show any acute findings. Benzodiazepines have been discontinued. Metabolic workup was initiated.  B12 level is 6073, folic acid 71.0, TSH 6.26, RPR nonreactive. In summary his altered mental status is most likely due to [place].  This was explained to the family. Mentation appears to be slightly better today.  Continue to monitor.  PT and OT evaluation.  Haldol as needed.  Aspiration precautions.  COVID-19 infection He apparently has completed course of molnupiravir.  Currently afebrile with normal  WBC.  Not hypoxic.  No indication for any further treatments at this time.  This was communicated to the wife. Positive result in our system is from 12/31.  However it looks like patient was positive 3 days prior to that based on documentation at the time of admission.  So it looks like he was positive on 12/28.  His 10-day isolation will end on 12/7.  He can come off of isolation on 12/8. CRP is noted to be elevated but he is otherwise stable.  Will not alter treatment at this time.  PAF/paroxysmal a flutter -This appears to be a new diagnosis.  Previous rounding MD discussed with cardiology.  Started on Eliquis.  Rate has been controlled so not on beta-blocker or calcium channel blocker.   He will need to be seen by cardiology in the outpatient setting.   Questionable unilateral weakness,  CVA ruled out.  Continue to monitor.  PT and OT evaluation.   Volume contraction/dehydration -Secondary to acute COVID infection and poor p.o. intake,.  Continue IV fluids for now.  Encourage oral intake.  Aspiration precautions.   Essential hypertension Was hypotensive initially.  Blood pressures have stabilized.  Home medications on hold.     Parkinson's dementia -Continue Sinemet -Baseline Parkinson's, ambulates independently, able to restroom by himself, speech is somewhat unintelligible at baseline, but otherwise requires assistance with ADLs.  DVT prophylaxis: Eliquis Code Status: Full Family Communication: Wife at bedside Disposition: SNF recommended by PT  Status is: Inpatient Remains inpatient appropriate because: Altered mental status    Consultants:  None  Procedures:  None  Antimicrobials:  None indicated  Subjective: Patient more responsive today.  Answering some questions though  very distracted.  Wife is at the bedside.  Objective: Vitals:   11/27/22 2300 11/27/22 2344 11/28/22 0335 11/28/22 0810  BP: 113/87 128/75 112/61 (!) 142/81  Pulse: 90 80 84 79  Resp: '20 20  20 20  '$ Temp: 99 F (37.2 C) 98.9 F (37.2 C) 99.2 F (37.3 C) (!) 96.8 F (36 C)  TempSrc: Axillary Axillary Axillary Axillary  SpO2: 99% 98% 96% 98%  Weight:      Height:        Intake/Output Summary (Last 24 hours) at 11/28/2022 1019 Last data filed at 11/28/2022 0328 Gross per 24 hour  Intake 1151.82 ml  Output 1150 ml  Net 1.82 ml    Filed Weights   11/25/22 1526  Weight: 79.4 kg    Examination:  General appearance: More awake and alert this morning.  More responsive.  Less delirious. Resp: Clear to auscultation bilaterally.  Normal effort Cardio: S1-S2 is normal regular.  No S3-S4.  No rubs murmurs or bruit GI: Abdomen is soft.  Nontender nondistended.  Bowel sounds are present normal.  No masses organomegaly Extremities: No edema.  Full range of motion of lower extremities. Neurologic: Remains confused.  No obvious focal neurological deficits.   Data Reviewed: I have personally reviewed following labs and imaging studies  CBC: Recent Labs  Lab 11/20/2022 1153 10/27/2022 1233 11/27/22 0545 11/28/22 0303  WBC 4.9  --  8.1 7.7  NEUTROABS 3.6  --   --   --   HGB 13.2 13.3  13.6 12.6* 13.4  HCT 38.9* 39.0  40.0 37.5* 40.5  MCV 91.7  --  92.6 94.4  PLT 148*  --  120* 142*    Basic Metabolic Panel: Recent Labs  Lab 11/14/2022 1153 10/30/2022 1233 11/27/22 0545 11/28/22 0303  NA 136 135  136 139 143  K 3.9 3.9  3.9 4.0 3.9  CL 102 102 110 115*  CO2 22  --  19* 18*  GLUCOSE 102* 97 123* 131*  BUN 30* 29* 26* 29*  CREATININE 1.37* 1.30* 1.21 1.33*  CALCIUM 8.9  --  8.4* 8.8*  MG  --   --   --  2.0    GFR: Estimated Creatinine Clearance: 45.6 mL/min (A) (by C-G formula based on SCr of 1.33 mg/dL (H)). Liver Function Tests: Recent Labs  Lab 11/15/2022 1153 11/28/22 0303  AST 27 26  ALT 21 9  ALKPHOS 57 54  BILITOT 1.0 1.7*  PROT 6.5 5.7*  ALBUMIN 3.6 2.9*     CBG: Recent Labs  Lab 11/21/2022 1148  GLUCAP 101*     Sepsis Labs: Recent Labs   Lab 11/01/2022 1153 11/11/2022 1305  LATICACIDVEN 0.9 1.8     Recent Results (from the past 240 hour(s))  Urine Culture     Status: None   Collection Time: 10/31/2022 11:05 AM   Specimen: Urine, Clean Catch  Result Value Ref Range Status   Specimen Description URINE, CLEAN CATCH  Final   Special Requests NONE  Final   Culture   Final    NO GROWTH Performed at [facility], [place] [address], [place], [place] [address]    Report Status 11/26/2022 FINAL  Final  Resp panel by RT-PCR (RSV, Flu A&B, Covid) Anterior Nasal Swab     Status: Abnormal   Collection Time: 10/27/2022 11:53 AM   Specimen: Anterior Nasal Swab  Result Value Ref Range Status   SARS Coronavirus 2 by RT PCR POSITIVE (A) NEGATIVE Final  Comment: (NOTE) SARS-CoV-2 target nucleic acids are DETECTED.  The SARS-CoV-2 RNA is generally detectable in upper respiratory specimens during the acute phase of infection. Positive results are indicative of the presence of the identified virus, but do not rule out bacterial infection or co-infection with other pathogens not detected by the test. Clinical correlation with patient history and other diagnostic information is necessary to determine patient infection status. The expected result is Negative.  Fact Sheet for Patients: EntrepreneurPulse.com.au  Fact Sheet for Healthcare Providers: IncredibleEmployment.be  This test is not yet approved or cleared by the [place] FDA and  has been authorized for detection and/or diagnosis of SARS-CoV-2 by FDA under an Emergency Use Authorization (EUA).  This EUA will remain in effect (meaning this test can be used) for the duration of  the COVID-19 declaration under Section 564(b)(1) of the A ct, 21 U.S.C. section 360bbb-3(b)(1), unless the authorization is terminated or revoked sooner.     Influenza A by PCR NEGATIVE NEGATIVE Final   Influenza B by PCR NEGATIVE NEGATIVE Final    Comment:  (NOTE) The Xpert Xpress SARS-CoV-2/FLU/RSV plus assay is intended as an aid in the diagnosis of influenza from Nasopharyngeal swab specimens and should not be used as a sole basis for treatment. Nasal washings and aspirates are unacceptable for Xpert Xpress SARS-CoV-2/FLU/RSV testing.  Fact Sheet for Patients: EntrepreneurPulse.com.au  Fact Sheet for Healthcare Providers: IncredibleEmployment.be  This test is not yet approved or cleared by the [place] FDA and has been authorized for detection and/or diagnosis of SARS-CoV-2 by FDA under an Emergency Use Authorization (EUA). This EUA will remain in effect (meaning this test can be used) for the duration of the COVID-19 declaration under Section 564(b)(1) of the Act, 21 U.S.C. section 360bbb-3(b)(1), unless the authorization is terminated or revoked.     Resp Syncytial Virus by PCR NEGATIVE NEGATIVE Final    Comment: (NOTE) Fact Sheet for Patients: EntrepreneurPulse.com.au  Fact Sheet for Healthcare Providers: IncredibleEmployment.be  This test is not yet approved or cleared by the [place] FDA and has been authorized for detection and/or diagnosis of SARS-CoV-2 by FDA under an Emergency Use Authorization (EUA). This EUA will remain in effect (meaning this test can be used) for the duration of the COVID-19 declaration under Section 564(b)(1) of the Act, 21 U.S.C. section 360bbb-3(b)(1), unless the authorization is terminated or revoked.  Performed at [facility], [place] [address] [place], [place] [address]   Culture, blood (routine x 2)     Status: None (Preliminary result)   Collection Time: 11/15/2022 11:53 AM   Specimen: BLOOD RIGHT FOREARM  Result Value Ref Range Status   Specimen Description BLOOD RIGHT FOREARM  Final   Special Requests   Final    BOTTLES DRAWN AEROBIC AND ANAEROBIC Blood Culture results may not be optimal due to an  excessive volume of blood received in culture bottles   Culture   Final    NO GROWTH 4 DAYS Performed at [facility], [place] [address], [place], [place] [address]    Report Status PENDING  Incomplete  Culture, blood (routine x 2)     Status: None (Preliminary result)   Collection Time: 10/27/2022 12:08 PM   Specimen: BLOOD  Result Value Ref Range Status   Specimen Description BLOOD RIGHT ANTECUBITAL  Final   Special Requests   Final    BOTTLES DRAWN AEROBIC AND ANAEROBIC Blood Culture results may not be optimal due to an excessive volume of blood  received in culture bottles   Culture   Final    NO GROWTH 4 DAYS Performed at [facility], [place] [address] [place], [place] [address]    Report Status PENDING  Incomplete         Radiology Studies: EEG adult  Result Date: 2022/12/20 Lora Havens, MD     2022/12/20  5:23 PM Patient Name: KAMEL HAVEN MRN: [id number] Epilepsy Attending: Lora Havens Referring Physician/Provider: Bonnielee Haff, MD Date: Dec 20, 2022 Duration: 22.51 mins Patient history: 82yo M with ams. EEG to evaluate for seizure. Level of alertness: Awake AEDs during EEG study: None Technical aspects: This EEG study was done with scalp electrodes positioned according to the 10-20 International system of electrode placement. Electrical activity was reviewed with band pass filter of 1-'70Hz'$ , sensitivity of 7 uV/mm, display speed of 11m/sec with a '60Hz'$  notched filter applied as appropriate. EEG data were recorded continuously and digitally stored.  Video monitoring was available and reviewed as appropriate. Description: The posterior dominant rhythm consists of 7 Hz activity of moderate voltage (25-35 uV) seen predominantly in posterior head regions, symmetric and reactive to eye opening and eye closing. EEG showed continuous generalized 5 to 7 Hz theta slowing. Hyperventilation and photic stimulation were not performed.   ABNORMALITY - Continuous slow,  generalized -  Background slow IMPRESSION: This study is suggestive of moderate diffuse encephalopathy, nonspecific etiology. No seizures or epileptiform discharges were seen throughout the recording. Priyanka OBarbra Sarks   Scheduled Meds:  apixaban  5 mg Oral BID   aspirin EC  81 mg Oral Daily   Carbidopa-Levodopa ER  1 tablet Oral BID   donepezil  10 mg Oral QHS   rosuvastatin  20 mg Oral Daily   Continuous Infusions:  sodium chloride 75 mL/hr at [id number]    LOS: 2 days    GBonnielee Haff  [facility]  If 7PM-7AM, please contact night-coverage [email or web address]  11/28/2022, 10:19 AM

## 2022-11-28 NOTE — TOC Initial Note (Signed)
Transition of Care [facility]) - Initial/Assessment Note    Patient Details  Name: Calvin Ramirez MRN: [id number] Date of Birth: 1941/03/29  Transition of Care [facility]) CM/SW Contact:    Jinger Neighbors, LCSW Phone Number: 11/28/2022, 12:35 PM  Clinical Narrative:                 CSW conversed with pt's spouse outside of pt's room to discuss PT recommendations for SNF; wife is agreeable. Wife discussed concerns of supports in the home once pt completes rehab. 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 CSW recommended she work with the SNF Social Worker on supports to assist in home, when pt is ready to d/c from the SNF.  CSW called [employer] to request assistance, as CSW was unable to complete a [employer]. Herminie Must has a different SS# for pt: [phone]; however, CSW was provided with the [place] number.   Expected Discharge Plan: Skilled Nursing Facility Barriers to Discharge: SNF Pending bed offer   Patient Goals and CMS Choice Patient states their goals for this hospitalization and ongoing recovery are:: Pt's wife would like for him to get stronger so he may return home CMS Medicare.gov Compare Post Acute Care list provided to:: Patient Represenative (must comment) (Pt spouse) Choice offered to / list presented to : Spouse      Expected Discharge Plan and Services     Post Acute Care Choice: IP Rehab Living arrangements for the past 2 months: Single Family Home                                      Prior Living Arrangements/Services Living arrangements for the past 2 months: Single Family Home Lives with:: Spouse Patient language and need for interpreter reviewed:: Yes        Need for Family Participation in Patient Care: Yes (Comment) Care giver support system in place?: Yes (comment)   Criminal Activity/Legal Involvement Pertinent to Current Situation/Hospitalization: No - Comment as  needed  Activities of Daily Living Home Assistive Devices/Equipment: Blood pressure cuff ADL Screening (condition at time of admission) Patient's cognitive ability adequate to safely complete daily activities?: No Is the patient deaf or have difficulty hearing?: No Does the patient have difficulty seeing, even when wearing glasses/contacts?: No Does the patient have difficulty concentrating, remembering, or making decisions?: Yes Patient able to express need for assistance with ADLs?: No Does the patient have difficulty dressing or bathing?: Yes Independently performs ADLs?: No Communication: Needs assistance Is this a change from baseline?: Change from baseline, expected to last >3 days Dressing (OT): Needs assistance Is this a change from baseline?: Change from baseline, expected to last <3days Grooming: Needs assistance Is this a change from baseline?: Change from baseline, expected to last <3 days Feeding: Needs assistance Is this a change from baseline?: Change from baseline, expected to last <3 days Bathing: Needs assistance Is this a change from baseline?: Change from baseline, expected to last <3 days Toileting: Needs assistance Is this a change from baseline?: Change from baseline, expected to last <3 days In/Out Bed: Needs assistance Is this a change from baseline?: Change from baseline, expected to last <3 days Walks in Home: Needs assistance Is this a change from baseline?: Change from baseline, expected to last <3 days Does the patient have difficulty walking  or climbing stairs?: Yes Weakness of Legs: Both Weakness of Arms/Hands: Right  Permission Sought/Granted                  Emotional Assessment       Orientation: : Oriented to Self      Admission diagnosis:  Altered mental status, unspecified altered mental status type [R41.82] AMS (altered mental status) [R41.82] Patient Active Problem List   Diagnosis Date Noted   Acute encephalopathy 11/11/2022    AMS (altered mental status) 11/02/2022   Paroxysmal atrial fibrillation ([place]) 03/17/2018   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   CAD (coronary artery disease) 02/24/2018   Dementia ([place])    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   Parkinson disease 04/27/2015   Essential hypertension    Acute on chronic diastolic heart failure ([place]) 06/27/2008   Hyperlipidemia    ACUTE SINUSITIS, UNSPECIFIED 11/24/2007   CONJUNCTIVITIS, ACUTE NOS 05/06/2007   PCP:  Shirline Frees, MD Pharmacy:   [facility] [place], [place] [address] AT [address] [place] [address] Phone: [phone] Fax: [place] [address], [place] [address] Phone: [phone] Fax: [phone]     Social Determinants of Health (SDOH) Social History: [place]: No Food Insecurity (11/25/2022)  Housing: Low Risk  (11/25/2022)  Transportation Needs: No Transportation Needs (11/25/2022)  Utilities: Not At Risk (11/25/2022)  Tobacco Use: Low Risk  (11/02/2022)   SDOH Interventions:     Readmission Risk Interventions     No data to display

## 2022-11-29 ENCOUNTER — Inpatient Hospital Stay (HOSPITAL_COMMUNITY): Payer: Medicare Other

## 2022-11-29 DIAGNOSIS — N179 Acute kidney failure, unspecified: Secondary | ICD-10-CM

## 2022-11-29 DIAGNOSIS — J690 Pneumonitis due to inhalation of food and vomit: Secondary | ICD-10-CM | POA: Diagnosis not present

## 2022-11-29 DIAGNOSIS — R918 Other nonspecific abnormal finding of lung field: Secondary | ICD-10-CM | POA: Diagnosis not present

## 2022-11-29 DIAGNOSIS — R509 Fever, unspecified: Secondary | ICD-10-CM | POA: Diagnosis not present

## 2022-11-29 DIAGNOSIS — G9340 Encephalopathy, unspecified: Secondary | ICD-10-CM | POA: Diagnosis not present

## 2022-11-29 LAB — CULTURE, BLOOD (ROUTINE X 2)
Culture: NO GROWTH
Culture: NO GROWTH

## 2022-11-29 LAB — BASIC METABOLIC PANEL
Anion gap: 11 (ref 5–15)
BUN: 39 mg/dL — ABNORMAL HIGH (ref 8–23)
CO2: 17 mmol/L — ABNORMAL LOW (ref 22–32)
Calcium: 9 mg/dL (ref 8.9–10.3)
Chloride: 115 mmol/L — ABNORMAL HIGH (ref 98–111)
Creatinine, Ser: 1.46 mg/dL — ABNORMAL HIGH (ref 0.61–1.24)
GFR, Estimated: 48 mL/min — ABNORMAL LOW (ref >60)
Glucose, Bld: 129 mg/dL — ABNORMAL HIGH (ref 70–99)
Potassium: 3.9 mmol/L (ref 3.5–5.1)
Sodium: 143 mmol/L (ref 135–145)

## 2022-11-29 MED ORDER — SODIUM CHLORIDE 0.9 % IV SOLN
2.0000 g | INTRAVENOUS | Status: DC
  Administered 2022-11-29 – 2022-11-30 (×2): 2 g via INTRAVENOUS
  Filled 2022-11-29 (×2): qty 20

## 2022-11-29 NOTE — Progress Notes (Signed)
Physical Therapy Treatment Patient Details Name: Calvin Ramirez MRN: [id number] DOB: January 17, 1941 Today's Date: 11/29/2022   History of Present Illness Patient is an 82 y.o. male with PMH: Parkinson's disease, CAD s/p CABG, HTN, HLD, lewy body dementia. He presented to the ED on 11/23/2022 for evaluation of worsening mentations and fall at home.   Covid +    PT Comments    Pt is slowly progressing towards goals. Due to medical diagnosis has had some fluctuations in progress and mental status. Pt was very restless today on entering room but was able to calm down with direct instructions and clearly addressing the patient. Pt continues to require heavy physical assistance for bed mobility and sit to stand. Unable to progress gait this session due to current functional status. Currently recommending skilled physical therapy services in SNF setting on discharge from acute care hospital setting due to previous level of function, current level of function in order to decrease need for physical assistance, risk for falls and risk for re-hospitalization. Prior to hospital admission spouse states pt was very active going to boxing for parkinsons's. Pt HR/O2 sats remained WNL throughout session today.     Recommendations for follow up therapy are one component of a multi-disciplinary discharge planning process, led by the attending physician.  Recommendations may be updated based on patient status, additional functional criteria and insurance authorization.  Follow Up Recommendations  Skilled nursing-short term rehab (<3 hours/day) Can patient physically be transported by private vehicle: No   Assistance Recommended at Discharge Frequent or constant Supervision/Assistance  Patient can return home with the following Assistance with cooking/housework;Assist for transportation;Help with stairs or ramp for entrance;Two people to help with walking and/or transfers   Equipment Recommendations  Wheelchair  (measurements PT);None recommended by PT (defer to post acute)    Recommendations for Other Services       Precautions / Restrictions Precautions Precautions: Fall Restrictions Weight Bearing Restrictions: No     Mobility  Bed Mobility Overal bed mobility: Needs Assistance Bed Mobility: Supine to Sit, Sit to Supine Rolling: Total assist   Supine to sit: Max assist, +2 for physical assistance Sit to supine: +2 for physical assistance, Max assist   General bed mobility comments: Pt was able to initiate assistance with cueing and with directly addressing pt following by pause, pt opening eyes then single step directions for mobility. Pt had difficutly staying on task Patient Response: Anxious, Restless, Impulsive  Transfers Overall transfer level: Needs assistance Equipment used: Rolling walker (2 wheels) Transfers: Sit to/from Stand Sit to Stand: Max assist           General transfer comment: Pt was able to stand 1x with Max A intermittent Mod A for balance with heavy posterior lean and unable to follow directions to move COM over BOS    Ambulation/Gait       General Gait Details: unable to progress this session due to current functional status.        Balance Overall balance assessment: Needs assistance Sitting-balance support: Feet supported Sitting balance-Leahy Scale: Poor Sitting balance - Comments: pt was able to intermittently sit with very close CGA/SBA with distraction in reaching up/over with the LUE due to heavy posterior lean mostly required Max-Mod A to maintain upright posture. Postural control: Posterior lean Standing balance support: Bilateral upper extremity supported Standing balance-Leahy Scale: Poor Standing balance comment: Max A +2 for balance due to heavy posterior lean       Cognition Arousal/Alertness: Lethargic Behavior During Therapy:  Flat affect Overall Cognitive Status: Difficult to assess Area of Impairment: Attention,  Orientation, Following commands, Safety/judgement, Awareness             General Comments: perseverating initially on pulling mitt off           General Comments General comments (skin integrity, edema, etc.): Spouse was present throughout session and stated that pt did better the other day.      Pertinent Vitals/Pain Pain Assessment Faces Pain Scale: Hurts a little bit Breathing: normal Negative Vocalization: occasional moan/groan, low speech, negative/disapproving quality Facial Expression: smiling or inexpressive Body Language: tense, distressed pacing, fidgeting Consolability: no need to console PAINAD Score: 2 Pain Intervention(s): Monitored during session     PT Goals (current goals can now be found in the care plan section) Acute Rehab PT Goals Patient Stated Goal: to get rehab prior to home PT Goal Formulation: With patient/family Time For Goal Achievement: 12/09/22 Potential to Achieve Goals: Good Progress towards PT goals: Progressing toward goals    Frequency    Min 3X/week      PT Plan Current plan remains appropriate       AM-PAC PT "6 Clicks" Mobility   Outcome Measure  Help needed turning from your back to your side while in a flat bed without using bedrails?: Total Help needed moving from lying on your back to sitting on the side of a flat bed without using bedrails?: Total Help needed moving to and from a bed to a chair (including a wheelchair)?: Total Help needed standing up from a chair using your arms (e.g., wheelchair or bedside chair)?: Total Help needed to walk in hospital room?: Total Help needed climbing 3-5 steps with a railing? : Total 6 Click Score: 6    End of Session Equipment Utilized During Treatment: Gait belt Activity Tolerance: Patient limited by lethargy Patient left: with family/visitor present;in bed;with call bell/phone within reach;with bed alarm set Nurse Communication: Mobility status PT Visit Diagnosis: Other  abnormalities of gait and mobility (R26.89);Muscle weakness (generalized) (M62.81);Other symptoms and signs involving the nervous system (R29.898)     Time: 0488-8916 PT Time Calculation (min) (ACUTE ONLY): 35 min  Charges:  $Therapeutic Activity: 23-37 mins                    Tomma Rakers, DPT, CLT  Acute Rehabilitation Services Office: [phone] (Secure chat preferred)    Ander Purpura 11/29/2022, 4:04 PM

## 2022-11-29 NOTE — Progress Notes (Signed)
PROGRESS NOTE    Calvin Ramirez  ZWC:[id number] DOB: 03-Mar-1941 DOA: 11/23/2022 PCP: Shirline Frees, MD   Brief Narrative:  Calvin Ramirez is a 82 y.o. male with medical history significant of Parkinson's dementia, CAD status post CABG, HTN, HLD, question of PAF, brought in by family member for evaluation of worsening mentations and questionable unilateral weakness on the left as well as worsening confusion from baseline dementia for approximately 5 days.  Typically at baseline is able to ambulate restroom by himself and is verbal but somewhat difficult to understand.  3 days prior to admission patient was evaluated by PCP with positive COVID swab and started on molnupiravir(12/28) -with worsening mental status and ultimately presented to the hospital 12/31.   Assessment & Plan:   Acute metabolic encephalopathy on baseline parkinsons dementia -Molnupiravir has known side effect of dizziness possibly accounting for fall but mental status changes are uncommon per package insert. UA did not suggest infection.  Chest x-ray did not show any pneumonia. CT head did not show any acute intracranial findings.  MRI brain was motion degraded but did not show any acute findings.  EEG did not show any epileptiform activity. Benzodiazepines have been discontinued. Metabolic workup was initiated.  B12 level is 2353, folic acid 61.4, TSH 4.31, RPR nonreactive. In summary his altered mental status is most likely due to [place].  This was explained to the family. Continues to have waxing and waning mentation.  Haldol as needed.  Aspiration precautions.  Fever/possible aspiration pneumonia Noted to have low-grade fever overnight. Chest x-ray shows patchy bilateral infiltrates. Not noted to be hypoxic.  Patient has completed course of molnupiravir as discussed earlier.  Steroids will likely make his mentation worse. He could have also aspirated.  We will place him on Unasyn for now. UA is  pending.  COVID-19 infection He apparently has completed course of molnupiravir.  Positive result in our system is from 12/31.  However it looks like patient was positive 3 days prior to that based on documentation at the time of admission.  So it looks like he was positive on 12/28.  His 10-day isolation will end on 12/7.  He can come off of isolation on 12/8. CRP is noted to be elevated but he is otherwise stable.  See discussion above.  PAF/paroxysmal a flutter -This appears to be a new diagnosis.  Previous rounding MD discussed with cardiology.  Started on Eliquis.  Rate has been controlled so not on beta-blocker or calcium channel blocker.   He will need to be seen by cardiology in the outpatient setting.   Questionable unilateral weakness,  CVA ruled out.  Continue to monitor.  PT and OT evaluation.   Acute kidney injury  Rising creatinine noted.  Likely due to poor oral intake.  Continue IV fluids.  Monitor urine output.  Bladder scan.     Essential hypertension Was hypotensive initially.  Blood pressures have stabilized.  Home medications on hold.     Parkinson's dementia -Continue Sinemet -Baseline Parkinson's, ambulates independently, able to restroom by himself, speech is somewhat unintelligible at baseline, but otherwise requires assistance with ADLs.  DVT prophylaxis: Eliquis Code Status: Full Family Communication: Wife at bedside Disposition: SNF recommended by PT  Status is: Inpatient Remains inpatient appropriate because: Altered mental status    Consultants:  None  Procedures:  None  Antimicrobials:  None indicated  Subjective: Noted to be agitated today.  Not as responsive compared to yesterday.  Objective: Vitals:   11/28/22 2332  11/29/22 0332 11/29/22 0838 11/29/22 1131  BP: 117/80 128/85 122/80 (!) 129/92  Pulse: 84 80 86 100  Resp: '20 20 19 20  '$ Temp: 100.1 F (37.8 C) 99.3 F (37.4 C) 99.3 F (37.4 C) (!) 100.7 F (38.2 C)  TempSrc:  Axillary Axillary Axillary Axillary  SpO2: 97% 96% 95% 94%  Weight:      Height:        Intake/Output Summary (Last 24 hours) at 11/29/2022 1137 Last data filed at 11/29/2022 0415 Gross per 24 hour  Intake 675.12 ml  Output 400 ml  Net 275.12 ml    Filed Weights   11/25/22 1526  Weight: 79.4 kg    Examination:  General appearance: Agitated.  Keeps his eyes closed.  Does not answer questions. Resp: Coarse breath sounds bilaterally with frequent pulse.  No wheezing or rhonchi. Cardio: S1-S2 is normal regular.  No S3-S4.  No rubs murmurs or bruit GI: Abdomen is soft.  Nontender nondistended.  Bowel sounds are present normal.  No masses organomegaly Extremities: No edema.  Noted to be moving his extremities Neurologic: No obvious focal neurological deficits.   Data Reviewed: I have personally reviewed following labs and imaging studies  CBC: Recent Labs  Lab 11/14/2022 1153 11/20/2022 1233 11/27/22 0545 11/28/22 0303  WBC 4.9  --  8.1 7.7  NEUTROABS 3.6  --   --   --   HGB 13.2 13.3  13.6 12.6* 13.4  HCT 38.9* 39.0  40.0 37.5* 40.5  MCV 91.7  --  92.6 94.4  PLT 148*  --  120* 142*    Basic Metabolic Panel: Recent Labs  Lab 10/25/2022 1153 11/23/2022 1233 11/27/22 0545 11/28/22 0303 11/29/22 0457  NA 136 135  136 139 143 143  K 3.9 3.9  3.9 4.0 3.9 3.9  CL 102 102 110 115* 115*  CO2 22  --  19* 18* 17*  GLUCOSE 102* 97 123* 131* 129*  BUN 30* 29* 26* 29* 39*  CREATININE 1.37* 1.30* 1.21 1.33* 1.46*  CALCIUM 8.9  --  8.4* 8.8* 9.0  MG  --   --   --  2.0  --     GFR: Estimated Creatinine Clearance: 41.5 mL/min (A) (by C-G formula based on SCr of 1.46 mg/dL (H)). Liver Function Tests: Recent Labs  Lab 11/12/2022 1153 11/28/22 0303  AST 27 26  ALT 21 9  ALKPHOS 57 54  BILITOT 1.0 1.7*  PROT 6.5 5.7*  ALBUMIN 3.6 2.9*     CBG: Recent Labs  Lab 11/14/2022 1148  GLUCAP 101*     Sepsis Labs: Recent Labs  Lab 10/27/2022 1153 11/03/2022 1305   LATICACIDVEN 0.9 1.8     Recent Results (from the past 240 hour(s))  Urine Culture     Status: None   Collection Time: 11/11/2022 11:05 AM   Specimen: Urine, Clean Catch  Result Value Ref Range Status   Specimen Description URINE, CLEAN CATCH  Final   Special Requests NONE  Final   Culture   Final    NO GROWTH Performed at [facility], [place] [address] [place], [place] [address]    Report Status 11/26/2022 FINAL  Final  Resp panel by RT-PCR (RSV, Flu A&B, Covid) Anterior Nasal Swab     Status: Abnormal   Collection Time: 11/15/2022 11:53 AM   Specimen: Anterior Nasal Swab  Result Value Ref Range Status   SARS Coronavirus 2 by RT PCR POSITIVE (A) NEGATIVE Final    Comment: (  NOTE) SARS-CoV-2 target nucleic acids are DETECTED.  The SARS-CoV-2 RNA is generally detectable in upper respiratory specimens during the acute phase of infection. Positive results are indicative of the presence of the identified virus, but do not rule out bacterial infection or co-infection with other pathogens not detected by the test. Clinical correlation with patient history and other diagnostic information is necessary to determine patient infection status. The expected result is Negative.  Fact Sheet for Patients: EntrepreneurPulse.com.au  Fact Sheet for Healthcare Providers: IncredibleEmployment.be  This test is not yet approved or cleared by the [place] FDA and  has been authorized for detection and/or diagnosis of SARS-CoV-2 by FDA under an Emergency Use Authorization (EUA).  This EUA will remain in effect (meaning this test can be used) for the duration of  the COVID-19 declaration under Section 564(b)(1) of the A ct, 21 U.S.C. section 360bbb-3(b)(1), unless the authorization is terminated or revoked sooner.     Influenza A by PCR NEGATIVE NEGATIVE Final   Influenza B by PCR NEGATIVE NEGATIVE Final    Comment: (NOTE) The Xpert Xpress  SARS-CoV-2/FLU/RSV plus assay is intended as an aid in the diagnosis of influenza from Nasopharyngeal swab specimens and should not be used as a sole basis for treatment. Nasal washings and aspirates are unacceptable for Xpert Xpress SARS-CoV-2/FLU/RSV testing.  Fact Sheet for Patients: EntrepreneurPulse.com.au  Fact Sheet for Healthcare Providers: IncredibleEmployment.be  This test is not yet approved or cleared by the [place] FDA and has been authorized for detection and/or diagnosis of SARS-CoV-2 by FDA under an Emergency Use Authorization (EUA). This EUA will remain in effect (meaning this test can be used) for the duration of the COVID-19 declaration under Section 564(b)(1) of the Act, 21 U.S.C. section 360bbb-3(b)(1), unless the authorization is terminated or revoked.     Resp Syncytial Virus by PCR NEGATIVE NEGATIVE Final    Comment: (NOTE) Fact Sheet for Patients: EntrepreneurPulse.com.au  Fact Sheet for Healthcare Providers: IncredibleEmployment.be  This test is not yet approved or cleared by the [place] FDA and has been authorized for detection and/or diagnosis of SARS-CoV-2 by FDA under an Emergency Use Authorization (EUA). This EUA will remain in effect (meaning this test can be used) for the duration of the COVID-19 declaration under Section 564(b)(1) of the Act, 21 U.S.C. section 360bbb-3(b)(1), unless the authorization is terminated or revoked.  Performed at [facility], [place] [address] [place], [place] [address]   Culture, blood (routine x 2)     Status: None   Collection Time: 10/28/2022 11:53 AM   Specimen: BLOOD RIGHT FOREARM  Result Value Ref Range Status   Specimen Description BLOOD RIGHT FOREARM  Final   Special Requests   Final    BOTTLES DRAWN AEROBIC AND ANAEROBIC Blood Culture results may not be optimal due to an excessive volume of blood received in culture  bottles   Culture   Final    NO GROWTH 5 DAYS Performed at [facility], [place] [address] [place], [place] [address]    Report Status 11/29/2022 FINAL  Final  Culture, blood (routine x 2)     Status: None   Collection Time: 11/13/2022 12:08 PM   Specimen: BLOOD  Result Value Ref Range Status   Specimen Description BLOOD RIGHT ANTECUBITAL  Final   Special Requests   Final    BOTTLES DRAWN AEROBIC AND ANAEROBIC Blood Culture results may not be optimal due to an excessive volume of blood received in culture bottles  Culture   Final    NO GROWTH 5 DAYS Performed at [facility], [place] [address], [place], [place] [address]    Report Status 11/29/2022 FINAL  Final         Radiology Studies: DG CHEST PORT 1 VIEW  Result Date: 11/29/2022 CLINICAL DATA:  COVID-19 positive, fever EXAM: PORTABLE CHEST 1 VIEW COMPARISON:  Portable exam 0727 hours compared to 11/23/2022 FINDINGS: Enlargement of cardiac silhouette post CABG. Mediastinal contours normal. Patchy BILATERAL pulmonary infiltrates new since previous exam. No pleural effusion or pneumothorax. Atherosclerotic calcification aorta. IMPRESSION: Patchy BILATERAL pulmonary infiltrates new since previous study favoring multifocal pneumonia including COVID-19, though pulmonary edema not completely excluded. Aortic Atherosclerosis (ICD10-I70.0). Electronically Signed   By: Lavonia Dana M.D.   On: 11/29/2022 08:23   EEG adult  Result Date: 11/27/2022 Lora Havens, MD     11/27/2022  5:23 PM Patient Name: SHAHZAD THOMANN MRN: [id number] Epilepsy Attending: Lora Havens Referring Physician/Provider: Bonnielee Haff, MD Date: 11/27/2022 Duration: 22.51 mins Patient history: 82yo M with ams. EEG to evaluate for seizure. Level of alertness: Awake AEDs during EEG study: None Technical aspects: This EEG study was done with scalp electrodes positioned according to the 10-20 International system of electrode placement. Electrical activity was  reviewed with band pass filter of 1-'70Hz'$ , sensitivity of 7 uV/mm, display speed of 61m/sec with a '60Hz'$  notched filter applied as appropriate. EEG data were recorded continuously and digitally stored.  Video monitoring was available and reviewed as appropriate. Description: The posterior dominant rhythm consists of 7 Hz activity of moderate voltage (25-35 uV) seen predominantly in posterior head regions, symmetric and reactive to eye opening and eye closing. EEG showed continuous generalized 5 to 7 Hz theta slowing. Hyperventilation and photic stimulation were not performed.   ABNORMALITY - Continuous slow, generalized -  Background slow IMPRESSION: This study is suggestive of moderate diffuse encephalopathy, nonspecific etiology. No seizures or epileptiform discharges were seen throughout the recording. Priyanka OBarbra Sarks   Scheduled Meds:  apixaban  5 mg Oral BID   aspirin EC  81 mg Oral Daily   Carbidopa-Levodopa ER  1 tablet Oral BID   donepezil  10 mg Oral QHS   rosuvastatin  20 mg Oral Daily   Continuous Infusions:  sodium chloride 75 mL/hr at 11/29/22 0314    LOS: 3 days    GBonnielee Haff  [facility]  If 7PM-7AM, please contact night-coverage [email or web address]  11/29/2022, 11:37 AM

## 2022-11-29 NOTE — TOC Progression Note (Signed)
Transition of Care [facility]) - Progression Note    Patient Details  Name: CHAY MAZZONI MRN: [id number] Date of Birth: 1941-05-01  Transition of Care [facility]) CM/SW Contact  Jinger Neighbors, [place] Phone Number: 11/29/2022, 1:27 PM  Clinical Narrative:    CSW reviewed bed offer for pt and noted pt had one bed offer for [facility]. CSW sent out 2 other offers and [facility] also accepted. CSW introduced pt's wife to [facility] admissions coordinator, Juliann Pulse. CSW provided print out of choices and ratings. Pt's spouse was tearful and states she is scared of the "unknown" with pt's progressive disease. PT and spouse have turned their downstairs into a one floor apartment with an ADA bathroom; pt needs assistance at home when he discharges from the SNF and CSW recommended (as well as Juliann Pulse) that pt's spouse meet with SNF Social Workers to further discuss resources.    Expected Discharge Plan: [place] Barriers to Discharge: SNF Pending bed offer  Expected Discharge Plan and Services     Post Acute Care Choice: IP Rehab Living arrangements for the past 2 months: Single Family Home                                       Social Determinants of Health (SDOH) Interventions SDOH Screenings   Food Insecurity: No Food Insecurity (11/25/2022)  Housing: Low Risk  (11/25/2022)  Transportation Needs: No Transportation Needs (11/25/2022)  Utilities: Not At Risk (11/25/2022)  Tobacco Use: Low Risk  (10/31/2022)    Readmission Risk Interventions     No data to display

## 2022-11-30 DIAGNOSIS — J690 Pneumonitis due to inhalation of food and vomit: Secondary | ICD-10-CM | POA: Diagnosis not present

## 2022-11-30 DIAGNOSIS — G9340 Encephalopathy, unspecified: Secondary | ICD-10-CM | POA: Diagnosis not present

## 2022-11-30 DIAGNOSIS — N179 Acute kidney failure, unspecified: Secondary | ICD-10-CM | POA: Diagnosis not present

## 2022-11-30 LAB — CBC
HCT: 42.2 % (ref 39.0–52.0)
Hemoglobin: 13.6 g/dL (ref 13.0–17.0)
MCH: 30.3 pg (ref 26.0–34.0)
MCHC: 32.2 g/dL (ref 30.0–36.0)
MCV: 94 fL (ref 80.0–100.0)
Platelets: 182 10*3/uL (ref 150–400)
RBC: 4.49 MIL/uL (ref 4.22–5.81)
RDW: 13.7 % (ref 11.5–15.5)
WBC: 9.7 10*3/uL (ref 4.0–10.5)
nRBC: 0.2 % (ref 0.0–0.2)

## 2022-11-30 LAB — BASIC METABOLIC PANEL
Anion gap: 15 (ref 5–15)
BUN: 61 mg/dL — ABNORMAL HIGH (ref 8–23)
CO2: 16 mmol/L — ABNORMAL LOW (ref 22–32)
Calcium: 9.1 mg/dL (ref 8.9–10.3)
Chloride: 118 mmol/L — ABNORMAL HIGH (ref 98–111)
Creatinine, Ser: 2.02 mg/dL — ABNORMAL HIGH (ref 0.61–1.24)
GFR, Estimated: 32 mL/min — ABNORMAL LOW (ref >60)
Glucose, Bld: 145 mg/dL — ABNORMAL HIGH (ref 70–99)
Potassium: 4.3 mmol/L (ref 3.5–5.1)
Sodium: 149 mmol/L — ABNORMAL HIGH (ref 135–145)

## 2022-11-30 MED ORDER — SODIUM CHLORIDE 0.45 % IV SOLN: INTRAVENOUS | Status: DC

## 2022-11-30 MED ORDER — MORPHINE SULFATE (PF) 2 MG/ML IV SOLN
2.0000 mg | Freq: Once | INTRAVENOUS | Status: AC
  Administered 2022-11-30: 2 mg via INTRAVENOUS
  Filled 2022-11-30: qty 1

## 2022-11-30 MED ORDER — MORPHINE SULFATE (PF) 2 MG/ML IV SOLN
2.0000 mg | INTRAVENOUS | Status: DC | PRN
  Administered 2022-11-30 – 2022-12-01 (×3): 2 mg via INTRAVENOUS
  Filled 2022-11-30 (×3): qty 1

## 2022-11-30 NOTE — Progress Notes (Addendum)
PROGRESS NOTE    AUTHUR Ramirez  NTI:[id number] DOB: [birth year] DOA: 11/12/2022 PCP: Shirline Frees, MD   Brief Narrative:  Calvin Ramirez is a 82 y.o. male with medical history significant of Parkinson's dementia, CAD status post CABG, HTN, HLD, question of PAF, brought in by family member for evaluation of worsening mentations and questionable unilateral weakness on the left as well as worsening confusion from baseline dementia for approximately 5 days.  Typically at baseline is able to ambulate restroom by himself and is verbal but somewhat difficult to understand.  3 days prior to admission patient was evaluated by PCP with positive COVID swab and started on molnupiravir(12/28) -with worsening mental status and ultimately presented to the hospital 12/31.   Assessment & Plan:   Acute metabolic encephalopathy on baseline parkinsons dementia -Molnupiravir has known side effect of dizziness possibly accounting for fall but mental status changes are uncommon per package insert. UA did not suggest infection.  Chest x-ray did not show any pneumonia. CT head did not show any acute intracranial findings.  MRI brain was motion degraded but did not show any acute findings.  EEG did not show any epileptiform activity. Benzodiazepines have been discontinued. Metabolic workup was initiated.  B12 level is 8676, folic acid 19.5, TSH 0.93, RPR nonreactive. In summary his altered mental status is most likely due to [place].  This was explained to the family. Patient has been more altered over the last 48 hours.  To be given Haldol today for agitation.  Will also give him morphine as the patient's wife thinks that he is in a lot of pain and discomfort.  Fever/possible aspiration pneumonia Chest x-ray shows patchy bilateral infiltrates.  Findings could be due to COVID but patient has received molnupiravir.  Wife tells me that he may not have completed his course but the utility of giving him  antivirals after almost a week of being positive is not clear. Will avoid steroids and this person was already altered and somewhat agitated. He was started on ceftriaxone yesterday as he could have aspirated.  Unasyn was considered but patient has allergy to penicillin.  COVID-19 infection He apparently has completed course of molnupiravir.  Positive result in our system is from 12/31.  However it looks like patient was positive 3 days prior to that based on documentation at the time of admission.  So it looks like he was positive on 12/28.  His 10-day isolation will end on Dec 28, 2022.  He can come off of isolation on 1/8. CRP is noted to be elevated but he is otherwise stable.  See discussion above.  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  Previous rounding MD discussed with cardiology.  Started on Eliquis.  Rate has been controlled so not on beta-blocker or calcium channel blocker.   He will need to be seen by cardiology in the outpatient setting.   Questionable unilateral weakness,  CVA ruled out.  Continue to monitor.  PT and OT evaluation.   Acute kidney injury  Rising creatinine noted.  Likely due to poor oral intake.  Continue IV fluids.  Monitor urine output.  Bladder scan.   Recheck labs.  Labs are pending from today.   Essential hypertension Was hypotensive initially.  Blood pressures have stabilized.  Home medications on hold.     Parkinson's dementia -Continue Sinemet -Baseline Parkinson's, ambulates independently, able to restroom by himself, speech is somewhat unintelligible at baseline, but otherwise requires assistance with ADLs.  Goals of  care Patient has had very poor oral intake in the last 48 hours.  His mentation is also worsened.  CODE STATUS was discussed with patient's wife today.  He has previously known wishes of DNR DNI.  These orders have been placed. Nutrition was also discussed with patient's wife.  Will see how he does over the next 24 to  48 hours before making further decisions. Patient's wife wanted me to call patient's daughter who is a Industrial/product designer in [place].  Spoke to Dr. Joseph Art over the phone.  She feels that patient is declining and would not want any aggressive care.  She understands that if he does not improve in the next 24 to 48 hours transition to comfort care may be reasonable.  She does not want him on any artificial nutrition.  She will discuss all of these issues with her mother who will be the final decision maker.  DVT prophylaxis: Eliquis Code Status: DNR Family Communication: Wife at bedside Disposition: SNF recommended by PT  Status is: Inpatient Remains inpatient appropriate because: Altered mental status    Consultants:  None  Procedures:  None  Antimicrobials:  None indicated  Subjective: Remains confused agitated.  Does not open his eyes.  Does not answer any questions.  Objective: Vitals:   11/29/22 2342 11/30/22 0340 11/30/22 0420 11/30/22 0708  BP: (!) 143/110 132/73  116/88  Pulse: 100 (!) 108 (!) 108 (!) 113  Resp: '19 20  20  '$ Temp: 99.4 F (37.4 C) 98.2 F (36.8 C)  98.4 F (36.9 C)  TempSrc: Axillary Oral  Axillary  SpO2: 93% 97%    Weight:      Height:        Intake/Output Summary (Last 24 hours) at 11/30/2022 1103 Last data filed at 11/30/2022 0730 Gross per 24 hour  Intake 50 ml  Output 400 ml  Net -350 ml    Filed Weights   11/25/22 1526  Weight: 79.4 kg    Examination:  General appearance: Patient to be agitated.  In no distress. Resp: Coarse breath sounds bilaterally with few crackles at the bases. Cardio: S1-S2 is normal regular.  No S3-S4.  No rubs murmurs or bruit GI: Abdomen is soft.  Nontender nondistended.  Bowel sounds are present normal.  No masses organomegaly Extremities: No edema.   Neurologic: Disoriented.  Agitated.  Moving all of his extremities  Data Reviewed: I have personally reviewed following labs and imaging  studies  CBC: Recent Labs  Lab 10/30/2022 1153 10/30/2022 1233 11/27/22 0545 11/28/22 0303  WBC 4.9  --  8.1 7.7  NEUTROABS 3.6  --   --   --   HGB 13.2 13.3  13.6 12.6* 13.4  HCT 38.9* 39.0  40.0 37.5* 40.5  MCV 91.7  --  92.6 94.4  PLT 148*  --  120* 142*    Basic Metabolic Panel: Recent Labs  Lab 11/14/2022 1153 11/23/2022 1233 11/27/22 0545 11/28/22 0303 11/29/22 0457  NA 136 135  136 139 143 143  K 3.9 3.9  3.9 4.0 3.9 3.9  CL 102 102 110 115* 115*  CO2 22  --  19* 18* 17*  GLUCOSE 102* 97 123* 131* 129*  BUN 30* 29* 26* 29* 39*  CREATININE 1.37* 1.30* 1.21 1.33* 1.46*  CALCIUM 8.9  --  8.4* 8.8* 9.0  MG  --   --   --  2.0  --     GFR: Estimated Creatinine Clearance: 41.5 mL/min (A) (by C-G formula based  on SCr of 1.46 mg/dL (H)). Liver Function Tests: Recent Labs  Lab 11/21/2022 1153 11/28/22 0303  AST 27 26  ALT 21 9  ALKPHOS 57 54  BILITOT 1.0 1.7*  PROT 6.5 5.7*  ALBUMIN 3.6 2.9*     CBG: Recent Labs  Lab 11/19/2022 1148  GLUCAP 101*     Sepsis Labs: Recent Labs  Lab 11/15/2022 1153 10/29/2022 1305  LATICACIDVEN 0.9 1.8     Recent Results (from the past 240 hour(s))  Urine Culture     Status: None   Collection Time: 10/31/2022 11:05 AM   Specimen: Urine, Clean Catch  Result Value Ref Range Status   Specimen Description URINE, CLEAN CATCH  Final   Special Requests NONE  Final   Culture   Final    NO GROWTH Performed at [facility], [place] [address] [place], [place] [address]    Report Status 11/26/2022 FINAL  Final  Resp panel by RT-PCR (RSV, Flu A&B, Covid) Anterior Nasal Swab     Status: Abnormal   Collection Time: 11/07/2022 11:53 AM   Specimen: Anterior Nasal Swab  Result Value Ref Range Status   SARS Coronavirus 2 by RT PCR POSITIVE (A) NEGATIVE Final    Comment: (NOTE) SARS-CoV-2 target nucleic acids are DETECTED.  The SARS-CoV-2 RNA is generally detectable in upper respiratory specimens during the acute phase of infection.  Positive results are indicative of the presence of the identified virus, but do not rule out bacterial infection or co-infection with other pathogens not detected by the test. Clinical correlation with patient history and other diagnostic information is necessary to determine patient infection status. The expected result is Negative.  Fact Sheet for Patients: EntrepreneurPulse.com.au  Fact Sheet for Healthcare Providers: IncredibleEmployment.be  This test is not yet approved or cleared by the [place] FDA and  has been authorized for detection and/or diagnosis of SARS-CoV-2 by FDA under an Emergency Use Authorization (EUA).  This EUA will remain in effect (meaning this test can be used) for the duration of  the COVID-19 declaration under Section 564(b)(1) of the A ct, 21 U.S.C. section 360bbb-3(b)(1), unless the authorization is terminated or revoked sooner.     Influenza A by PCR NEGATIVE NEGATIVE Final   Influenza B by PCR NEGATIVE NEGATIVE Final    Comment: (NOTE) The Xpert Xpress SARS-CoV-2/FLU/RSV plus assay is intended as an aid in the diagnosis of influenza from Nasopharyngeal swab specimens and should not be used as a sole basis for treatment. Nasal washings and aspirates are unacceptable for Xpert Xpress SARS-CoV-2/FLU/RSV testing.  Fact Sheet for Patients: EntrepreneurPulse.com.au  Fact Sheet for Healthcare Providers: IncredibleEmployment.be  This test is not yet approved or cleared by the [place] FDA and has been authorized for detection and/or diagnosis of SARS-CoV-2 by FDA under an Emergency Use Authorization (EUA). This EUA will remain in effect (meaning this test can be used) for the duration of the COVID-19 declaration under Section 564(b)(1) of the Act, 21 U.S.C. section 360bbb-3(b)(1), unless the authorization is terminated or revoked.     Resp Syncytial Virus by PCR  NEGATIVE NEGATIVE Final    Comment: (NOTE) Fact Sheet for Patients: EntrepreneurPulse.com.au  Fact Sheet for Healthcare Providers: IncredibleEmployment.be  This test is not yet approved or cleared by the [place] FDA and has been authorized for detection and/or diagnosis of SARS-CoV-2 by FDA under an Emergency Use Authorization (EUA). This EUA will remain in effect (meaning this test can be used) for the duration of  the COVID-19 declaration under Section 564(b)(1) of the Act, 21 U.S.C. section 360bbb-3(b)(1), unless the authorization is terminated or revoked.  Performed at [facility], [place] [address], [place], [place] [address]   Culture, blood (routine x 2)     Status: None   Collection Time: 11/11/2022 11:53 AM   Specimen: BLOOD RIGHT FOREARM  Result Value Ref Range Status   Specimen Description BLOOD RIGHT FOREARM  Final   Special Requests   Final    BOTTLES DRAWN AEROBIC AND ANAEROBIC Blood Culture results may not be optimal due to an excessive volume of blood received in culture bottles   Culture   Final    NO GROWTH 5 DAYS Performed at [facility], [place] [address] [place], [place] [address]    Report Status 11/29/2022 FINAL  Final  Culture, blood (routine x 2)     Status: None   Collection Time: 11/13/2022 12:08 PM   Specimen: BLOOD  Result Value Ref Range Status   Specimen Description BLOOD RIGHT ANTECUBITAL  Final   Special Requests   Final    BOTTLES DRAWN AEROBIC AND ANAEROBIC Blood Culture results may not be optimal due to an excessive volume of blood received in culture bottles   Culture   Final    NO GROWTH 5 DAYS Performed at [facility], [place] [address], [place], [place] [address]    Report Status 11/29/2022 FINAL  Final         Radiology Studies: DG CHEST PORT 1 VIEW  Result Date: 11/29/2022 CLINICAL DATA:  COVID-19 positive, fever EXAM: PORTABLE CHEST 1 VIEW COMPARISON:  Portable exam 0727  hours compared to 11/16/2022 FINDINGS: Enlargement of cardiac silhouette post CABG. Mediastinal contours normal. Patchy BILATERAL pulmonary infiltrates new since previous exam. No pleural effusion or pneumothorax. Atherosclerotic calcification aorta. IMPRESSION: Patchy BILATERAL pulmonary infiltrates new since previous study favoring multifocal pneumonia including COVID-19, though pulmonary edema not completely excluded. Aortic Atherosclerosis (ICD10-I70.0). Electronically Signed   By: Lavonia Dana M.D.   On: 11/29/2022 08:23    Scheduled Meds:  apixaban  5 mg Oral BID   aspirin EC  81 mg Oral Daily   Carbidopa-Levodopa ER  1 tablet Oral BID   donepezil  10 mg Oral QHS   rosuvastatin  20 mg Oral Daily   Continuous Infusions:  sodium chloride 75 mL/hr at 11/30/22 0924   cefTRIAXone (ROCEPHIN)  IV 2 g (11/29/22 1522)    LOS: 4 days    Bonnielee Haff,  [facility]  If 7PM-7AM, please contact night-coverage [email or web address]  11/30/2022, 11:03 AM

## 2022-12-01 DIAGNOSIS — Z515 Encounter for palliative care: Secondary | ICD-10-CM

## 2022-12-01 DIAGNOSIS — U071 COVID-19: Secondary | ICD-10-CM | POA: Diagnosis not present

## 2022-12-01 DIAGNOSIS — G9349 Other encephalopathy: Secondary | ICD-10-CM | POA: Diagnosis not present

## 2022-12-01 LAB — BASIC METABOLIC PANEL
Anion gap: 17 — ABNORMAL HIGH (ref 5–15)
BUN: 67 mg/dL — ABNORMAL HIGH (ref 8–23)
CO2: 13 mmol/L — ABNORMAL LOW (ref 22–32)
Calcium: 9.1 mg/dL (ref 8.9–10.3)
Chloride: 119 mmol/L — ABNORMAL HIGH (ref 98–111)
Creatinine, Ser: 2.24 mg/dL — ABNORMAL HIGH (ref 0.61–1.24)
GFR, Estimated: 29 mL/min — ABNORMAL LOW (ref >60)
Glucose, Bld: 131 mg/dL — ABNORMAL HIGH (ref 70–99)
Potassium: 4.1 mmol/L (ref 3.5–5.1)
Sodium: 149 mmol/L — ABNORMAL HIGH (ref 135–145)

## 2022-12-01 MED ORDER — HALOPERIDOL 0.5 MG PO TABS: 0.5000 mg | ORAL_TABLET | ORAL | Status: DC | PRN

## 2022-12-01 MED ORDER — LORAZEPAM 1 MG PO TABS: 1.0000 mg | ORAL_TABLET | ORAL | Status: DC | PRN

## 2022-12-01 MED ORDER — LORAZEPAM 2 MG/ML PO CONC
1.0000 mg | ORAL | Status: DC | PRN
  Administered 2022-12-01: 1 mg via SUBLINGUAL
  Filled 2022-12-01: qty 1

## 2022-12-01 MED ORDER — SODIUM CHLORIDE 0.9 % IV SOLN: 250.0000 mL | INTRAVENOUS | Status: DC | PRN

## 2022-12-01 MED ORDER — HALOPERIDOL LACTATE 5 MG/ML IJ SOLN: 0.5000 mg | INTRAMUSCULAR | Status: DC | PRN

## 2022-12-01 MED ORDER — POLYVINYL ALCOHOL 1.4 % OP SOLN: 1.0000 [drp] | Freq: Four times a day (QID) | OPHTHALMIC | Status: DC | PRN

## 2022-12-01 MED ORDER — GLYCOPYRROLATE 0.2 MG/ML IJ SOLN: 0.2000 mg | INTRAMUSCULAR | Status: DC | PRN

## 2022-12-01 MED ORDER — GLYCOPYRROLATE 1 MG PO TABS: 1.0000 mg | ORAL_TABLET | ORAL | Status: DC | PRN

## 2022-12-01 MED ORDER — BIOTENE DRY MOUTH MT LIQD: 15.0000 mL | OROMUCOSAL | Status: DC | PRN

## 2022-12-01 MED ORDER — MORPHINE SULFATE (PF) 2 MG/ML IV SOLN
4.0000 mg | Freq: Once | INTRAVENOUS | Status: AC
  Administered 2022-12-01: 4 mg via INTRAVENOUS
  Filled 2022-12-01: qty 2

## 2022-12-01 MED ORDER — ACETAMINOPHEN 325 MG PO TABS: 650.0000 mg | ORAL_TABLET | Freq: Four times a day (QID) | ORAL | Status: DC | PRN

## 2022-12-01 MED ORDER — ONDANSETRON HCL 4 MG/2ML IJ SOLN: 4.0000 mg | Freq: Four times a day (QID) | INTRAMUSCULAR | Status: DC | PRN

## 2022-12-01 MED ORDER — SODIUM CHLORIDE 0.9% FLUSH: 3.0000 mL | INTRAVENOUS | Status: DC | PRN

## 2022-12-01 MED ORDER — MORPHINE 100MG IN NS 100ML (1MG/ML) PREMIX INFUSION
2.0000 mg/h | INTRAVENOUS | Status: DC
  Administered 2022-12-01: 2 mg/h via INTRAVENOUS
  Filled 2022-12-01: qty 100

## 2022-12-01 MED ORDER — ONDANSETRON 4 MG PO TBDP: 4.0000 mg | ORAL_TABLET | Freq: Four times a day (QID) | ORAL | Status: DC | PRN

## 2022-12-01 MED ORDER — LORAZEPAM 2 MG/ML PO CONC: 1.0000 mg | ORAL | Status: DC | PRN

## 2022-12-01 MED ORDER — MORPHINE SULFATE (PF) 2 MG/ML IV SOLN
2.0000 mg | INTRAVENOUS | Status: DC | PRN
  Administered 2022-12-01 (×3): 2 mg via INTRAVENOUS
  Filled 2022-12-01 (×3): qty 1

## 2022-12-01 MED ORDER — HALOPERIDOL LACTATE 2 MG/ML PO CONC
0.5000 mg | ORAL | Status: DC | PRN
  Filled 2022-12-01: qty 5

## 2022-12-01 MED ORDER — ACETAMINOPHEN 650 MG RE SUPP: 650.0000 mg | Freq: Four times a day (QID) | RECTAL | Status: DC | PRN

## 2022-12-01 MED ORDER — SODIUM CHLORIDE 0.9% FLUSH
3.0000 mL | Freq: Two times a day (BID) | INTRAVENOUS | Status: DC
  Administered 2022-12-01: 3 mL via INTRAVENOUS

## 2022-12-01 MED ORDER — GLYCOPYRROLATE 0.2 MG/ML IJ SOLN
0.2000 mg | INTRAMUSCULAR | Status: DC | PRN
  Administered 2022-12-01: 0.2 mg via INTRAVENOUS
  Filled 2022-12-01 (×2): qty 1

## 2022-12-02 NOTE — Progress Notes (Signed)
During rounds, patient found not breathing. No carotid and radial pulses palpable, no breaths observed, no heart sounds. Time of death 03/03/2314 on 28-Dec-2022, confirmed with Charge RN, Phyl. On call physician, Dr. Kristopher Oppenheim, notified at 2326-03-03. Patient's spouse, Royale Swamy, notified.  Morphine gtt discontinued and wasted 67.5 ml with Clemy, RN.

## 2022-12-26 NOTE — Progress Notes (Signed)
PROGRESS NOTE    Calvin Ramirez  SNK:[id number] DOB: 1941/11/03 DOA: 11/17/2022 PCP: Shirline Frees, MD   Brief Narrative:  Calvin Ramirez is a 82 y.o. male with medical history significant of Parkinson's dementia, CAD status post CABG, HTN, HLD, question of PAF, brought in by family member for evaluation of worsening mentations and questionable unilateral weakness on the left as well as worsening confusion from baseline dementia for approximately 5 days.  Typically at baseline is able to ambulate restroom by himself and is verbal but somewhat difficult to understand.  3 days prior to admission patient was evaluated by PCP with positive COVID swab and started on molnupiravir(12/28) -with worsening mental status and ultimately presented to the hospital 12/31.   Assessment & Plan:   Goals of care Patient's condition has declined in the last 48 hours.  Condition has been discussed with patient's wife as well as her daughter in [place].  They understand that he is likely approaching end-of-life.  Will transition to comfort care.  Wife is agreeable.   May need morphine infusion but we will see how he does with the as needed doses.  Acute metabolic encephalopathy on baseline parkinsons dementia -Molnupiravir has known side effect of dizziness possibly accounting for fall but mental status changes are uncommon per package insert. UA did not suggest infection.  Chest x-ray did not show any pneumonia. CT head did not show any acute intracranial findings.  MRI brain was motion degraded but did not show any acute findings.  EEG did not show any epileptiform activity. Metabolic workup was initiated.  B12 level is 9379, folic acid 02.4, TSH 0.97, RPR nonreactive. In summary his altered mental status is most likely due to [place].   Patient remains quite encephalopathic.  He appears to be declining.  Has had Very poor oral intake in the last 48 hours.  Wife understands that he is declining.   This was also discussed with patient's daughter who is a Engineer, drilling in [place].    Fever/possible aspiration pneumonia Chest x-ray shows patchy bilateral infiltrates.  Findings could be due to COVID but patient has received molnupiravir.  Wife tells me that he may not have completed his course but the utility of giving him antivirals after almost a week of being positive is not clear but probably not of much benefit. Steroids to be avoided in this individual who is already quite encephalopathic. Patient was started on ceftriaxone.  Unasyn was considered but patient has allergy to penicillin.  Will discontinue antibiotics as we transition him to comfort care.  COVID-19 infection He apparently has completed course of molnupiravir.  Positive result in our system is from 12/31.  However it looks like patient was positive 3 days prior to that based on documentation at the time of admission.  So it looks like he was positive on 12/28.  His 10-day isolation will end on December 24, 2022.  He can come off of isolation on 1/8. CRP is noted to be elevated but he is otherwise stable.  See discussion above.  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  Previous rounding MD discussed with cardiology.  Started on Eliquis.  Rate has been controlled so not on beta-blocker or calcium channel blocker.  Anticoagulation to be discontinued now that we are transitioning to comfort care.   Questionable unilateral weakness,  CVA ruled out.    Acute kidney injury  Creatinine continues to worsen.  Likely multifactorial but mainly due to poor oral intake.  He  has been on IV fluids with no improvement.  Now being transitioned to comfort care.     Essential hypertension Was hypotensive initially.  Blood pressures have stabilized.  Home medications on hold.     Parkinson's dementia -Continue Sinemet -Baseline Parkinson's, ambulates independently, able to restroom by himself, speech is somewhat unintelligible at  baseline, but otherwise requires assistance with ADLs.   DVT prophylaxis: Comfort care Code Status: DNR Family Communication: Wife at bedside Disposition: Anticipate in-hospital death  Status is: Inpatient Remains inpatient appropriate because: Altered mental status    Consultants:  None  Procedures:  None  Antimicrobials:  None indicated  Subjective: Remains encephalopathic.  Does not open his eyes.  Appears to be in a lot of discomfort.  Objective: Vitals:   11/30/22 2049 12-12-2022 0037 12-Dec-2022 0455 December 12, 2022 0909  BP: (!) 133/94 119/87 129/87 127/82  Pulse: 94 (!) 108 86 (!) 121  Resp: '20 19 20 '$ (!) 22  Temp: 98.9 F (37.2 C) 98.9 F (37.2 C) 99.6 F (37.6 C) 99.8 F (37.7 C)  TempSrc: Axillary Axillary Axillary Oral  SpO2: 98% 92% 94% 100%  Weight:      Height:        Intake/Output Summary (Last 24 hours) at 12-12-22 1006 Last data filed at 2022-12-12 0500 Gross per 24 hour  Intake 1478.14 ml  Output 410 ml  Net 1068.14 ml    Filed Weights   11/25/22 1526  Weight: 79.4 kg    Examination:  Patient quite agitated.  Appears to be in discomfort. Lungs are coarse on examination.  Few crackles at the bases. S1-S2 is normal regular.  Data Reviewed: I have personally reviewed following labs and imaging studies  CBC: Recent Labs  Lab 11/12/2022 1153 11/14/2022 1233 11/27/22 0545 11/28/22 0303 11/30/22 1043  WBC 4.9  --  8.1 7.7 9.7  NEUTROABS 3.6  --   --   --   --   HGB 13.2 13.3  13.6 12.6* 13.4 13.6  HCT 38.9* 39.0  40.0 37.5* 40.5 42.2  MCV 91.7  --  92.6 94.4 94.0  PLT 148*  --  120* 142* 867    Basic Metabolic Panel: Recent Labs  Lab 11/27/22 0545 11/28/22 0303 11/29/22 0457 11/30/22 1043 2022/12/12 0244  NA 139 143 143 149* 149*  K 4.0 3.9 3.9 4.3 4.1  CL 110 115* 115* 118* 119*  CO2 19* 18* 17* 16* 13*  GLUCOSE 123* 131* 129* 145* 131*  BUN 26* 29* 39* 61* 67*  CREATININE 1.21 1.33* 1.46* 2.02* 2.24*  CALCIUM 8.4* 8.8* 9.0 9.1  9.1  MG  --  2.0  --   --   --     GFR: Estimated Creatinine Clearance: 27.1 mL/min (A) (by C-G formula based on SCr of 2.24 mg/dL (H)).  Liver Function Tests: Recent Labs  Lab 11/13/2022 1153 11/28/22 0303  AST 27 26  ALT 21 9  ALKPHOS 57 54  BILITOT 1.0 1.7*  PROT 6.5 5.7*  ALBUMIN 3.6 2.9*     CBG: Recent Labs  Lab 11/22/2022 1148  GLUCAP 101*     Sepsis Labs: Recent Labs  Lab 11/18/2022 1153 11/12/2022 1305  LATICACIDVEN 0.9 1.8     Recent Results (from the past 240 hour(s))  Urine Culture     Status: None   Collection Time: 11/14/2022 11:05 AM   Specimen: Urine, Clean Catch  Result Value Ref Range Status   Specimen Description URINE, CLEAN CATCH  Final   Special Requests NONE  Final   Culture   Final    NO GROWTH Performed at [facility], [place] [address] [place], [place] [address]    Report Status 11/26/2022 FINAL  Final  Resp panel by RT-PCR (RSV, Flu A&B, Covid) Anterior Nasal Swab     Status: Abnormal   Collection Time: 11/10/2022 11:53 AM   Specimen: Anterior Nasal Swab  Result Value Ref Range Status   SARS Coronavirus 2 by RT PCR POSITIVE (A) NEGATIVE Final    Comment: (NOTE) SARS-CoV-2 target nucleic acids are DETECTED.  The SARS-CoV-2 RNA is generally detectable in upper respiratory specimens during the acute phase of infection. Positive results are indicative of the presence of the identified virus, but do not rule out bacterial infection or co-infection with other pathogens not detected by the test. Clinical correlation with patient history and other diagnostic information is necessary to determine patient infection status. The expected result is Negative.  Fact Sheet for Patients: EntrepreneurPulse.com.au  Fact Sheet for Healthcare Providers: IncredibleEmployment.be  This test is not yet approved or cleared by the [place] FDA and  has been authorized for detection and/or diagnosis of SARS-CoV-2  by FDA under an Emergency Use Authorization (EUA).  This EUA will remain in effect (meaning this test can be used) for the duration of  the COVID-19 declaration under Section 564(b)(1) of the A ct, 21 U.S.C. section 360bbb-3(b)(1), unless the authorization is terminated or revoked sooner.     Influenza A by PCR NEGATIVE NEGATIVE Final   Influenza B by PCR NEGATIVE NEGATIVE Final    Comment: (NOTE) The Xpert Xpress SARS-CoV-2/FLU/RSV plus assay is intended as an aid in the diagnosis of influenza from Nasopharyngeal swab specimens and should not be used as a sole basis for treatment. Nasal washings and aspirates are unacceptable for Xpert Xpress SARS-CoV-2/FLU/RSV testing.  Fact Sheet for Patients: EntrepreneurPulse.com.au  Fact Sheet for Healthcare Providers: IncredibleEmployment.be  This test is not yet approved or cleared by the [place] FDA and has been authorized for detection and/or diagnosis of SARS-CoV-2 by FDA under an Emergency Use Authorization (EUA). This EUA will remain in effect (meaning this test can be used) for the duration of the COVID-19 declaration under Section 564(b)(1) of the Act, 21 U.S.C. section 360bbb-3(b)(1), unless the authorization is terminated or revoked.     Resp Syncytial Virus by PCR NEGATIVE NEGATIVE Final    Comment: (NOTE) Fact Sheet for Patients: EntrepreneurPulse.com.au  Fact Sheet for Healthcare Providers: IncredibleEmployment.be  This test is not yet approved or cleared by the [place] FDA and has been authorized for detection and/or diagnosis of SARS-CoV-2 by FDA under an Emergency Use Authorization (EUA). This EUA will remain in effect (meaning this test can be used) for the duration of the COVID-19 declaration under Section 564(b)(1) of the Act, 21 U.S.C. section 360bbb-3(b)(1), unless the authorization is terminated or revoked.  Performed at [facility], [place] [address], [place], [place] [address]   Culture, blood (routine x 2)     Status: None   Collection Time: 11/01/2022 11:53 AM   Specimen: BLOOD RIGHT FOREARM  Result Value Ref Range Status   Specimen Description BLOOD RIGHT FOREARM  Final   Special Requests   Final    BOTTLES DRAWN AEROBIC AND ANAEROBIC Blood Culture results may not be optimal due to an excessive volume of blood received in culture bottles   Culture   Final    NO GROWTH 5 DAYS Performed at [facility],  [address] [place], [place] [address]    Report Status 11/29/2022 FINAL  Final  Culture, blood (routine x 2)     Status: None   Collection Time: 10/31/2022 12:08 PM   Specimen: BLOOD  Result Value Ref Range Status   Specimen Description BLOOD RIGHT ANTECUBITAL  Final   Special Requests   Final    BOTTLES DRAWN AEROBIC AND ANAEROBIC Blood Culture results may not be optimal due to an excessive volume of blood received in culture bottles   Culture   Final    NO GROWTH 5 DAYS Performed at [facility], [place] [address] [place], [place] [address]    Report Status 11/29/2022 FINAL  Final         Radiology Studies: No results found.  Scheduled Meds:  sodium chloride flush  3 mL Intravenous Q12H   Continuous Infusions:  sodium chloride      LOS: 5 days    Bonnielee Haff,  [facility]  If 7PM-7AM, please contact night-coverage [email or web address]  12-21-2022, 10:06 AM

## 2022-12-26 NOTE — Progress Notes (Signed)
    OVERNIGHT PROGRESS REPORT  Notified by RN that patient has expired at 2315 on 12-12-22  Patient was comfort care  2 RN verified.  Family was not available to RN.  Kristopher Oppenheim, DO [facility]

## 2022-12-26 NOTE — Death Summary Note (Signed)
DEATH SUMMARY   Patient Details  Name: Calvin Ramirez MRN: [id number] DOB: Mar 15, 1941 [id number], Gwyndolyn Saxon, MD Admission/Discharge Information   Admit Date:  2022-12-22  Date of Death: Date of Death: 2022/12/29  Time of Death: Time of Death: 03/04/2314  Length of Stay: 6   Principle Cause of death: Encephalopathy due to [facility] Diagnoses: Encephalopathy due to COVID-19 COVID-19 infection   Parkinson disease   Paroxysmal atrial fibrillation ([place])   Acute encephalopathy  Brief HPI:  Calvin Ramirez is a 82 y.o. male with medical history significant of Parkinson's dementia, CAD status post CABG, HTN, HLD, question of PAF, brought in by family member for evaluation of worsening mentations and questionable unilateral weakness on the left as well as worsening confusion from baseline dementia for approximately 5 days.  Typically at baseline is able to ambulate restroom by himself and is verbal but somewhat difficult to understand.  3 days prior to admission patient was evaluated by PCP with positive COVID swab and started on molnupiravir(12/28) -with worsening mental status and ultimately presented to the hospital December 23, 2023.     Hospital Course:   Goals of care Patient's condition continued to decline.  After discussions with patient's wife and children he was transition to comfort care.  He expired on 12-29-2022 at 11:15 PM.     Encephalopathy secondary to COVID-19/parkinsons dementia at baseline UA did not suggest infection.  Chest x-ray did not show any pneumonia. CT head did not show any acute intracranial findings.  MRI brain was motion degraded but did not show any acute findings.  EEG did not show any epileptiform activity. Metabolic workup was initiated.  B12 level is 5638, folic acid 93.7, TSH 3.42, RPR nonreactive. In summary his altered mental status is most likely due to [place].     Fever/possible aspiration pneumonia Patient was started on ceftriaxone.  Unasyn was  considered but patient has allergy to penicillin.  Will discontinue antibiotics as we transition him to comfort care.   COVID-19 infection   PAF/paroxysmal a flutter   Questionable unilateral weakness,  CVA ruled out.    Acute kidney injury     Essential hypertension   Parkinson's dementia     The results of significant diagnostics from this hospitalization (including imaging, microbiology, ancillary and laboratory) are listed below for reference.   Significant Diagnostic Studies: DG CHEST PORT 1 VIEW  Result Date: 11/29/2022 CLINICAL DATA:  COVID-19 positive, fever EXAM: PORTABLE CHEST 1 VIEW COMPARISON:  Portable exam 0727 hours compared to Dec 22, 2022 FINDINGS: Enlargement of cardiac silhouette post CABG. Mediastinal contours normal. Patchy BILATERAL pulmonary infiltrates new since previous exam. No pleural effusion or pneumothorax. Atherosclerotic calcification aorta. IMPRESSION: Patchy BILATERAL pulmonary infiltrates new since previous study favoring multifocal pneumonia including COVID-19, though pulmonary edema not completely excluded. Aortic Atherosclerosis (ICD10-I70.0). Electronically Signed   By: Lavonia Dana M.D.   On: 11/29/2022 08:23   EEG adult  Result Date: 11/27/2022 Lora Havens, MD     11/27/2022  5:23 PM Patient Name: BRODRICK CURRAN MRN: [id number] Epilepsy Attending: Lora Havens Referring Physician/Provider: Bonnielee Haff, MD Date: 11/27/2022 Duration: 22.51 mins Patient history: 82yo M with ams. EEG to evaluate for seizure. Level of alertness: Awake AEDs during EEG study: None Technical aspects: This EEG study was done with scalp electrodes positioned according to the 10-20 International system of electrode placement. Electrical activity was reviewed with band pass filter of 1-'70Hz'$ , sensitivity of 7 uV/mm, display speed of 58m/sec with a '60Hz'$  notched filter  applied as appropriate. EEG data were recorded continuously and digitally stored.  Video monitoring  was available and reviewed as appropriate. Description: The posterior dominant rhythm consists of 7 Hz activity of moderate voltage (25-35 uV) seen predominantly in posterior head regions, symmetric and reactive to eye opening and eye closing. EEG showed continuous generalized 5 to 7 Hz theta slowing. Hyperventilation and photic stimulation were not performed.   ABNORMALITY - Continuous slow, generalized -  Background slow IMPRESSION: This study is suggestive of moderate diffuse encephalopathy, nonspecific etiology. No seizures or epileptiform discharges were seen throughout the recording. Lora Havens   MR BRAIN WO CONTRAST  Result Date: 11/25/2022 CLINICAL DATA:  82 year old male with altered mental status and neurologic deficit. Positive COVID-19. EXAM: MRI HEAD WITHOUT CONTRAST TECHNIQUE: Multiplanar, multiecho pulse sequences of the brain and surrounding structures were obtained without intravenous contrast. COMPARISON:  Head CT 11/12/2022.  Brain MRI 05/06/2022. FINDINGS: Brain: Study is intermittently degraded by motion artifact despite repeated imaging attempts. No restricted diffusion to suggest acute infarction. No midline shift, mass effect, evidence of mass lesion, ventriculomegaly, extra-axial collection or acute intracranial hemorrhage. Cervicomedullary junction and pituitary are within normal limits. Stable cerebral volume. Stable and essentially normal for age gray and white matter signal throughout the brain. No cortical encephalomalacia or chronic cerebral blood products identified. No cerebral edema identified. Vascular: Major intracranial vascular flow voids are stable. Skull and upper cervical spine: Negative for age visible cervical spine. Visualized bone marrow signal is within normal limits. Sinuses/Orbits: Stable, negative orbits. Mild bilateral paranasal sinus inflammation including probable small sinus fluid levels. Other: Mastoids remain clear. Visible internal auditory structures  appear normal. Small midline nasopharyngeal Tornwaldt cyst (normal variant). IMPRESSION: 1. Motion degraded exam with no acute intracranial abnormality. Stable noncontrast MRI appearance of the Brain since last year. 2. Mild paranasal sinus inflammation. Electronically Signed   By: Genevie Ann M.D.   On: 11/25/2022 10:40   CT Head Wo Contrast  Result Date: 11/21/2022 CLINICAL DATA:  Mental status change. Unknown cause. Recently diagnosed with COVID. Found by way urinating in the closet. History of dementia. EXAM: CT HEAD WITHOUT CONTRAST TECHNIQUE: Contiguous axial images were obtained from the base of the skull through the vertex without intravenous contrast. RADIATION DOSE REDUCTION: This exam was performed according to the departmental dose-optimization program which includes automated exposure control, adjustment of the mA and/or kV according to patient size and/or use of iterative reconstruction technique. COMPARISON:  CT brain 05/06/2022 FINDINGS: Brain: There is mild-to-moderate cortical atrophy, unchanged from prior and within normal limits for patient age. The ventricles are normal in configuration. The basilar cisterns are patent. No mass, mass effect, or midline shift. No acute intracranial hemorrhage is seen. No abnormal extra-axial fluid collection. Mild patchy periventricular white matter hypodensities are similar to prior, most likely chronic ischemic white matter changes. Otherwise, preservation of the normal cortical gray-white interface without CT evidence of an acute major vascular territorial cortical based infarction. Vascular: No hyperdense vessel or unexpected calcification. Skull: Normal. Negative for fracture or focal lesion. Sinuses/Orbits: Status post bilateral ocular lens replacements. The frontal sinuses are again developmentally non aerated. Minimal peripheral mucosal thickening of the ethmoid air cells and left maxillary sinus, new from 04/29/2022. New small layering air-fluid level  within right maxillary sinus. The mastoid air cells are clear. Other: None. IMPRESSION: 1. No acute intracranial process. 2. Mild-to-moderate cortical atrophy and mild chronic ischemic white matter changes, similar to prior. 3. New small layering air-fluid level within the right  maxillary sinus, nonspecific but can be seen with acute sinusitis in the appropriate clinical setting. Electronically Signed   By: Yvonne Kendall M.D.   On: 10/26/2022 11:50   DG Chest Port 1 View  Result Date: 11/21/2022 CLINICAL DATA:  Shortness of breath EXAM: PORTABLE CHEST 1 VIEW COMPARISON:  05/06/2022 FINDINGS: Stable cardiomegaly status post sternotomy and CABG. Aortic atherosclerosis. No focal airspace consolidation, pleural effusion, or pneumothorax. IMPRESSION: No active disease. Electronically Signed   By: Davina Poke D.O.   On: 10/27/2022 11:21    Microbiology: Recent Results (from the past 240 hour(s))  Urine Culture     Status: None   Collection Time: 11/11/2022 11:05 AM   Specimen: Urine, Clean Catch  Result Value Ref Range Status   Specimen Description URINE, CLEAN CATCH  Final   Special Requests NONE  Final   Culture   Final    NO GROWTH Performed at [facility], [address], [place], [place] [address]    Report Status 11/26/2022 FINAL  Final  Resp panel by RT-PCR (RSV, Flu A&B, Covid) Anterior Nasal Swab     Status: Abnormal   Collection Time: 11/21/2022 11:53 AM   Specimen: Anterior Nasal Swab  Result Value Ref Range Status   SARS Coronavirus 2 by RT PCR POSITIVE (A) NEGATIVE Final    Comment: (NOTE) SARS-CoV-2 target nucleic acids are DETECTED.  The SARS-CoV-2 RNA is generally detectable in upper respiratory specimens during the acute phase of infection. Positive results are indicative of the presence of the identified virus, but do not rule out bacterial infection or co-infection with other pathogens not detected by the test. Clinical correlation with patient history and other  diagnostic information is necessary to determine patient infection status. The expected result is Negative.  Fact Sheet for Patients: EntrepreneurPulse.com.au  Fact Sheet for Healthcare Providers: IncredibleEmployment.be  This test is not yet approved or cleared by the [place] FDA and  has been authorized for detection and/or diagnosis of SARS-CoV-2 by FDA under an Emergency Use Authorization (EUA).  This EUA will remain in effect (meaning this test can be used) for the duration of  the COVID-19 declaration under Section 564(b)(1) of the A ct, 21 U.S.C. section 360bbb-3(b)(1), unless the authorization is terminated or revoked sooner.     Influenza A by PCR NEGATIVE NEGATIVE Final   Influenza B by PCR NEGATIVE NEGATIVE Final    Comment: (NOTE) The Xpert Xpress SARS-CoV-2/FLU/RSV plus assay is intended as an aid in the diagnosis of influenza from Nasopharyngeal swab specimens and should not be used as a sole basis for treatment. Nasal washings and aspirates are unacceptable for Xpert Xpress SARS-CoV-2/FLU/RSV testing.  Fact Sheet for Patients: EntrepreneurPulse.com.au  Fact Sheet for Healthcare Providers: IncredibleEmployment.be  This test is not yet approved or cleared by the [place] FDA and has been authorized for detection and/or diagnosis of SARS-CoV-2 by FDA under an Emergency Use Authorization (EUA). This EUA will remain in effect (meaning this test can be used) for the duration of the COVID-19 declaration under Section 564(b)(1) of the Act, 21 U.S.C. section 360bbb-3(b)(1), unless the authorization is terminated or revoked.     Resp Syncytial Virus by PCR NEGATIVE NEGATIVE Final    Comment: (NOTE) Fact Sheet for Patients: EntrepreneurPulse.com.au  Fact Sheet for Healthcare Providers: IncredibleEmployment.be  This test is not yet approved or  cleared by the [place] FDA and has been authorized for detection and/or diagnosis of SARS-CoV-2 by FDA under an Emergency Use Authorization (  EUA). This EUA will remain in effect (meaning this test can be used) for the duration of the COVID-19 declaration under Section 564(b)(1) of the Act, 21 U.S.C. section 360bbb-3(b)(1), unless the authorization is terminated or revoked.  Performed at [facility], [place] [address], [place], [place] [address]   Culture, blood (routine x 2)     Status: None   Collection Time: 11/04/2022 11:53 AM   Specimen: BLOOD RIGHT FOREARM  Result Value Ref Range Status   Specimen Description BLOOD RIGHT FOREARM  Final   Special Requests   Final    BOTTLES DRAWN AEROBIC AND ANAEROBIC Blood Culture results may not be optimal due to an excessive volume of blood received in culture bottles   Culture   Final    NO GROWTH 5 DAYS Performed at [facility], [place] [address] [place], [place] [address]    Report Status 11/29/2022 FINAL  Final  Culture, blood (routine x 2)     Status: None   Collection Time: 11/16/2022 12:08 PM   Specimen: BLOOD  Result Value Ref Range Status   Specimen Description BLOOD RIGHT ANTECUBITAL  Final   Special Requests   Final    BOTTLES DRAWN AEROBIC AND ANAEROBIC Blood Culture results may not be optimal due to an excessive volume of blood received in culture bottles   Culture   Final    NO GROWTH 5 DAYS Performed at [facility], [place] [address] [place], [place] [address]    Report Status 11/29/2022 FINAL  Final     Signed: Bonnielee Haff, MD

## 2022-12-26 DEATH — deceased

## 2023-01-02 ENCOUNTER — Ambulatory Visit: Payer: Medicare Other | Admitting: Podiatry
